# Patient Record
Sex: Male | Born: 1940
Health system: Southern US, Community
[De-identification: ages and names within clinical notes are randomized; demographics above are authoritative.]

## PROBLEM LIST (undated history)

## (undated) ENCOUNTER — Ambulatory Visit: Admission: EM | Payer: Medicare Other

## (undated) DIAGNOSIS — I2510 Atherosclerotic heart disease of native coronary artery without angina pectoris: Secondary | ICD-10-CM

## (undated) DIAGNOSIS — R011 Cardiac murmur, unspecified: Secondary | ICD-10-CM

## (undated) DIAGNOSIS — Z8673 Personal history of transient ischemic attack (TIA), and cerebral infarction without residual deficits: Secondary | ICD-10-CM

## (undated) DIAGNOSIS — D649 Anemia, unspecified: Secondary | ICD-10-CM

## (undated) DIAGNOSIS — K859 Acute pancreatitis without necrosis or infection, unspecified: Secondary | ICD-10-CM

## (undated) DIAGNOSIS — K219 Gastro-esophageal reflux disease without esophagitis: Secondary | ICD-10-CM

## (undated) DIAGNOSIS — E039 Hypothyroidism, unspecified: Secondary | ICD-10-CM

## (undated) DIAGNOSIS — Z8679 Personal history of other diseases of the circulatory system: Secondary | ICD-10-CM

## (undated) DIAGNOSIS — M1990 Unspecified osteoarthritis, unspecified site: Secondary | ICD-10-CM

## (undated) DIAGNOSIS — I517 Cardiomegaly: Secondary | ICD-10-CM

## (undated) DIAGNOSIS — I350 Nonrheumatic aortic (valve) stenosis: Secondary | ICD-10-CM

## (undated) DIAGNOSIS — Z86711 Personal history of pulmonary embolism: Secondary | ICD-10-CM

## (undated) DIAGNOSIS — Z87442 Personal history of urinary calculi: Secondary | ICD-10-CM

## (undated) DIAGNOSIS — I340 Nonrheumatic mitral (valve) insufficiency: Secondary | ICD-10-CM

## (undated) DIAGNOSIS — E669 Obesity, unspecified: Secondary | ICD-10-CM

## (undated) DIAGNOSIS — R600 Localized edema: Secondary | ICD-10-CM

## (undated) DIAGNOSIS — I509 Heart failure, unspecified: Secondary | ICD-10-CM

## (undated) DIAGNOSIS — I071 Rheumatic tricuspid insufficiency: Secondary | ICD-10-CM

## (undated) DIAGNOSIS — Z8614 Personal history of Methicillin resistant Staphylococcus aureus infection: Secondary | ICD-10-CM

## (undated) HISTORY — PX: VASECTOMY: SHX75

## (undated) HISTORY — PX: JEJUNOSTOMY FEEDING TUBE: SUR737

## (undated) HISTORY — PX: BACK SURGERY: SHX140

## (undated) HISTORY — PX: CATARACT EXTRACTION: SUR2

## (undated) HISTORY — PX: CARDIAC CATHETERIZATION: SHX172

## (undated) HISTORY — PX: CHOLECYSTECTOMY: SHX55

## (undated) HISTORY — PX: KNEE ARTHROSCOPY: SHX127

## (undated) HISTORY — PX: WISDOM TOOTH EXTRACTION: SHX21

## (undated) HISTORY — PX: PEG TUBE PLACEMENT: SUR1034

---

## 2001-03-24 ENCOUNTER — Ambulatory Visit (HOSPITAL_COMMUNITY): Admission: RE | Admit: 2001-03-24 | Discharge: 2001-03-24 | Payer: Self-pay | Admitting: General Surgery

## 2001-06-22 ENCOUNTER — Encounter: Payer: Self-pay | Admitting: Family Medicine

## 2001-06-22 ENCOUNTER — Ambulatory Visit (HOSPITAL_COMMUNITY): Admission: RE | Admit: 2001-06-22 | Discharge: 2001-06-22 | Payer: Self-pay | Admitting: Family Medicine

## 2002-12-18 ENCOUNTER — Ambulatory Visit (HOSPITAL_COMMUNITY): Admission: RE | Admit: 2002-12-18 | Discharge: 2002-12-18 | Payer: Self-pay | Admitting: Family Medicine

## 2002-12-19 ENCOUNTER — Ambulatory Visit (HOSPITAL_COMMUNITY): Admission: RE | Admit: 2002-12-19 | Discharge: 2002-12-19 | Payer: Self-pay | Admitting: Family Medicine

## 2003-02-26 ENCOUNTER — Ambulatory Visit (HOSPITAL_COMMUNITY): Admission: RE | Admit: 2003-02-26 | Discharge: 2003-02-27 | Payer: Self-pay | Admitting: Neurosurgery

## 2007-05-10 ENCOUNTER — Ambulatory Visit (HOSPITAL_COMMUNITY): Admission: RE | Admit: 2007-05-10 | Discharge: 2007-05-10 | Payer: Self-pay | Admitting: Internal Medicine

## 2007-05-13 ENCOUNTER — Ambulatory Visit (HOSPITAL_COMMUNITY): Admission: RE | Admit: 2007-05-13 | Discharge: 2007-05-13 | Payer: Self-pay | Admitting: Internal Medicine

## 2007-05-30 ENCOUNTER — Encounter: Admission: RE | Admit: 2007-05-30 | Discharge: 2007-05-30 | Payer: Self-pay | Admitting: Neurosurgery

## 2010-06-13 NOTE — Op Note (Signed)
NAME:  Andrew Watson, Andrew Watson NO.:  [id number]   MEDICAL RECORD NO.:  [id number]                   PATIENT TYPE:  OIB   LOCATION:  3023                                 FACILITY:  [facility]   PHYSICIAN:  Cristi Loron, M.D.            DATE OF BIRTH:  Feb 27, 1940   DATE OF PROCEDURE:  02/26/2003  DATE OF DISCHARGE:                                 OPERATIVE REPORT   PREOPERATIVE DIAGNOSES:  Left L5-S1 herniated nucleus pulposus, stenosis,  lumbar radiculopathy and lumbago.   POSTOPERATIVE DIAGNOSES:  Left L5-S1 herniated nucleus pulposus, stenosis,  lumbar radiculopathy and lumbago.   PROCEDURE:  Laminotomy, foraminotomy with microdiskectomy at left L5-S1  using microdissection.   SURGEON:  Cristi Loron, M.D.   ASSISTANT:  Coletta Memos, M.D.   ANESTHESIA:  General endotracheal anesthesia.   ESTIMATED BLOOD LOSS:  Minimal.   SPECIMENS:  None.   DRAINS:  None.   COMPLICATIONS:  None.   INDICATIONS FOR PROCEDURE:  The patient is a 70 year old white male who has  suffered from back and left leg pain consistent with a lumbar radiculopathy.  He failed medical management  and was worked up with a lumbar MRI which  demonstrated a herniated disk at L5-S1 on the left. Of note, the patient did  appear to have a transitional type vertebra which may have affected the  numbering system seen on x-ray reports. The patient failed medical  management. He therefore weighed the  risks, benefits and alternatives of  surgery and decided to proceed with a microdiskectomy.   DESCRIPTION OF PROCEDURE:  The patient was brought to the operating room by  the anesthesia team. General endotracheal anesthesia was induced. The  patient was turned to the prone position on the Wilson frame. His  lumbosacral region was then shaved and prepared with Betadine scrub and  Betadine solution. Sterile drapes were applied. I then injected the area to  be  incised with Marcaine with  epinephrine solution.   I then used a scalpel to make a linear and midline incision over the L5-S1  interspace. I used electrocautery to dissect down to the thoracolumbar  fascia and divided the fascia just to the left of the midline, performing a  left-sided subperiosteal dissection, stripping the paraspinous musculature  from the left spinous process lamina of L4-L5 and S1.  I inserted the McCullough retractor for exposure, and then obtained an  intraoperative radiograph to confirm our location.   We then brought the operating microscope into the field, and under its  magnification and illumination, completed the microdissection,  decompression. I used the high-speed drill to perform a left L5 laminotomy.  I widened the laminotomy with a Kerrison punch and removed the left L5-S1  ligament of Flavum.   I performed a foraminotomy about the left S1 nerve root. There was quite a  bit of stenosis at the S1 level as well, so I used a high-speed drill  to  thin out the cephalad portion of the left S1 lamina. I then used the  Kerrison punch to complete the superior laminotomy at S1 on the left. This  gave the  thecal sac much more room and greater access to the left S1 nerve  root.   We then used microdissection to free up the nerve root  at its sac from the  epidural tissue. Dr. Franky Macho then carefully retracted  the left S1 nerve  root medially with the D'Errico retractor. This exposed a moderate to large  herniated disk directly ventral to the exit route of the left S1 nerve root.   I incised into the herniated disk with a #15 blade scalpel. I performed a  partial intervertebral diskectomy and removed the herniated disk as well as  performed a partial intervertebral diskectomy using the pituitary forceps  and the Carlen curets.   After we were satisfied with the diskectomy, we used the osteophyte tool to  remove some posterior spondylosis from the vertebral endplates at L5-S1. We  then  palpated along the ventral surface of the  thecal sac and along the  exit route of the left S1 nerve root. We noted that the neural structures  were well decompressed.   We obtained stringent hemostasis using bipolar electrocautery. We copiously  irrigated the wound out with Bacitracin solution and removed the solution.  We then removed the [facility] retractor. We then reapproximated the  patient's thoracolumbar fascia with interrupted #1 Vicryl suture, the  subcutaneous tissue with interrupted 2-0 Vicryl suture  and the skin with  Steri-Strips and Benzoin. The wound was then coated with Bacitracin ointment  and a sterile dressing was applied.   The drapes were removed. The patient was subsequently returned to the supine  position, where  he was extubated by the anesthesia team and transported to  the post anesthesia care unit in stable condition. All sponge, instrument  and needle counts were correct at the end of the case.                                               Cristi Loron, M.D.    JDJ/MEDQ  D:  02/27/2003  T:  02/27/2003  Job:  [id number]

## 2011-01-27 DIAGNOSIS — Z8679 Personal history of other diseases of the circulatory system: Secondary | ICD-10-CM

## 2011-01-27 HISTORY — DX: Personal history of other diseases of the circulatory system: Z86.79

## 2011-02-03 ENCOUNTER — Encounter (INDEPENDENT_AMBULATORY_CARE_PROVIDER_SITE_OTHER): Payer: Self-pay | Admitting: *Deleted

## 2011-02-03 ENCOUNTER — Other Ambulatory Visit (INDEPENDENT_AMBULATORY_CARE_PROVIDER_SITE_OTHER): Payer: Self-pay | Admitting: *Deleted

## 2011-02-03 DIAGNOSIS — Z1211 Encounter for screening for malignant neoplasm of colon: Secondary | ICD-10-CM

## 2011-02-19 ENCOUNTER — Telehealth (INDEPENDENT_AMBULATORY_CARE_PROVIDER_SITE_OTHER): Payer: Self-pay | Admitting: *Deleted

## 2011-02-19 MED ORDER — PEG-KCL-NACL-NASULF-NA ASC-C 100 G PO SOLR: 1.0000 | Freq: Once | ORAL | Status: DC

## 2011-02-19 NOTE — Telephone Encounter (Signed)
Patient needs movi prep 

## 2011-02-19 NOTE — Telephone Encounter (Signed)
Requesting MD/PCP:  Andrew Watson     Name & DOB: Andrew Watson  2040-06-08     Procedure: TCS  Reason/Indication:  screening  Has patient had this procedure before?  yes  If so, when, by whom and where?  More than 10 yrs ago  Is there a family history of colon cancer?  no  Who?  What age when diagnosed?    Is patient diabetic?   no      Does patient have prosthetic heart valve?  no  Do you have a pacemaker?  no  Has patient had joint replacement within last 12 months?  no  Is patient on Coumadin, Plavix and/or Aspirin? yes  Medications: asa 81 mg daily, advil prn  Allergies: knda  Pharmacy: [facility]  Medication Adjustment: asa 2 days  Procedure date & time: 03/05/11 @ 930

## 2011-02-20 NOTE — Telephone Encounter (Signed)
agree

## 2011-02-23 ENCOUNTER — Encounter (HOSPITAL_COMMUNITY): Payer: Self-pay | Admitting: Pharmacy Technician

## 2011-03-25 ENCOUNTER — Telehealth (INDEPENDENT_AMBULATORY_CARE_PROVIDER_SITE_OTHER): Payer: Self-pay | Admitting: *Deleted

## 2011-03-25 NOTE — Telephone Encounter (Signed)
agree

## 2011-03-25 NOTE — Telephone Encounter (Signed)
Requesting MD/PCP:  zack hall     Name & DOB: Andrew Watson March 18, 2040     Procedure: TCS  Reason/Indication:  screening  Has patient had this procedure before?  yes  If so, when, by whom and where?  More than 10 yrs ago  Is there a family history of colon cancer?  no  Who?  What age when diagnosed?    Is patient diabetic?   no      Does patient have prosthetic heart valve?  no  Do you have a pacemaker?  no  Has patient had joint replacement within last 12 months?  no  Is patient on Coumadin, Plavix and/or Aspirin? yes  Medications: asa 81 mg daily, advil prn  Allergies: nkda  Pharmacy: [facility]  Medication Adjustment: asa 2 days  Procedure date & time: 04/23/11 at 830

## 2011-04-13 ENCOUNTER — Encounter (INDEPENDENT_AMBULATORY_CARE_PROVIDER_SITE_OTHER): Payer: Self-pay | Admitting: *Deleted

## 2011-05-04 ENCOUNTER — Encounter (INDEPENDENT_AMBULATORY_CARE_PROVIDER_SITE_OTHER): Payer: Self-pay | Admitting: *Deleted

## 2011-05-07 ENCOUNTER — Emergency Department (HOSPITAL_COMMUNITY): Payer: Medicare Other

## 2011-05-07 ENCOUNTER — Encounter (HOSPITAL_COMMUNITY)
Admission: EM | Disposition: A | Discharge status: Home / Self Care | Payer: Self-pay | Source: Home / Self Care | Attending: Cardiovascular Disease

## 2011-05-07 ENCOUNTER — Ambulatory Visit (HOSPITAL_COMMUNITY): Admit: 2011-05-07 | Payer: Self-pay | Admitting: Cardiology

## 2011-05-07 ENCOUNTER — Encounter (HOSPITAL_COMMUNITY): Payer: Self-pay

## 2011-05-07 ENCOUNTER — Inpatient Hospital Stay (HOSPITAL_COMMUNITY)
Admission: EM | Admit: 2011-05-07 | Discharge: 2011-05-09 | DRG: 287 | Disposition: A | Discharge status: Home / Self Care | Payer: Medicare Other | Attending: Cardiovascular Disease | Admitting: Cardiovascular Disease

## 2011-05-07 DIAGNOSIS — M109 Gout, unspecified: Secondary | ICD-10-CM | POA: Diagnosis present

## 2011-05-07 DIAGNOSIS — Z79899 Other long term (current) drug therapy: Secondary | ICD-10-CM

## 2011-05-07 DIAGNOSIS — M129 Arthropathy, unspecified: Secondary | ICD-10-CM | POA: Diagnosis present

## 2011-05-07 DIAGNOSIS — I359 Nonrheumatic aortic valve disorder, unspecified: Secondary | ICD-10-CM

## 2011-05-07 DIAGNOSIS — R079 Chest pain, unspecified: Secondary | ICD-10-CM

## 2011-05-07 DIAGNOSIS — I319 Disease of pericardium, unspecified: Principal | ICD-10-CM

## 2011-05-07 DIAGNOSIS — I4891 Unspecified atrial fibrillation: Secondary | ICD-10-CM | POA: Diagnosis not present

## 2011-05-07 DIAGNOSIS — Z7982 Long term (current) use of aspirin: Secondary | ICD-10-CM

## 2011-05-07 DIAGNOSIS — I2510 Atherosclerotic heart disease of native coronary artery without angina pectoris: Secondary | ICD-10-CM | POA: Diagnosis present

## 2011-05-07 DIAGNOSIS — E039 Hypothyroidism, unspecified: Secondary | ICD-10-CM

## 2011-05-07 DIAGNOSIS — E785 Hyperlipidemia, unspecified: Secondary | ICD-10-CM | POA: Diagnosis present

## 2011-05-07 DIAGNOSIS — Z1211 Encounter for screening for malignant neoplasm of colon: Secondary | ICD-10-CM

## 2011-05-07 DIAGNOSIS — I480 Paroxysmal atrial fibrillation: Secondary | ICD-10-CM

## 2011-05-07 HISTORY — DX: Unspecified osteoarthritis, unspecified site: M19.90

## 2011-05-07 HISTORY — DX: Atherosclerotic heart disease of native coronary artery without angina pectoris: I25.10

## 2011-05-07 HISTORY — PX: LEFT HEART CATHETERIZATION WITH CORONARY ANGIOGRAM: SHX5451

## 2011-05-07 LAB — D-DIMER, QUANTITATIVE: D-Dimer, Quant: 0.44 ug/mL-FEU (ref 0.00–0.48)

## 2011-05-07 LAB — MRSA PCR SCREENING: MRSA by PCR: NEGATIVE

## 2011-05-07 LAB — CARDIAC PANEL(CRET KIN+CKTOT+MB+TROPI)
CK, MB: 2.8 ng/mL (ref 0.3–4.0)
CK, MB: 1.6 ng/mL (ref 0.3–4.0)
Relative Index: 1.5 (ref 0.0–2.5)
Relative Index: INVALID (ref 0.0–2.5)
Total CK: 185 U/L (ref 7–232)
Total CK: 72 U/L (ref 7–232)
Troponin I: <0.3 ng/mL (ref <0.30)

## 2011-05-07 LAB — BASIC METABOLIC PANEL
BUN: 18 mg/dL (ref 6–23)
CO2: 26 mEq/L (ref 19–32)
Calcium: 9.1 mg/dL (ref 8.4–10.5)
Chloride: 105 mEq/L (ref 96–112)
Creatinine, Ser: 0.93 mg/dL (ref 0.50–1.35)
GFR calc Af Amer: >90 mL/min (ref >90)
GFR calc non Af Amer: 83 mL/min — ABNORMAL LOW (ref >90)
Glucose, Bld: 115 mg/dL — ABNORMAL HIGH (ref 70–99)
Potassium: 4.2 mEq/L (ref 3.5–5.1)
Sodium: 139 mEq/L (ref 135–145)

## 2011-05-07 LAB — CBC
HCT: 37.7 % — ABNORMAL LOW (ref 39.0–52.0)
Hemoglobin: 12.9 g/dL — ABNORMAL LOW (ref 13.0–17.0)
MCH: 31.3 pg (ref 26.0–34.0)
MCHC: 34.2 g/dL (ref 30.0–36.0)
MCV: 91.5 fL (ref 78.0–100.0)
Platelets: 121 10*3/uL — ABNORMAL LOW (ref 150–400)
RBC: 4.12 MIL/uL — ABNORMAL LOW (ref 4.22–5.81)
RDW: 12.6 % (ref 11.5–15.5)
WBC: 5.9 10*3/uL (ref 4.0–10.5)

## 2011-05-07 LAB — TROPONIN I: Troponin I: <0.3 ng/mL (ref <0.30)

## 2011-05-07 LAB — TSH: TSH: 25.084 u[IU]/mL — ABNORMAL HIGH (ref 0.350–4.500)

## 2011-05-07 LAB — PROTIME-INR: Prothrombin Time: 14.1 seconds (ref 11.6–15.2)

## 2011-05-07 SURGERY — LEFT HEART CATHETERIZATION WITH CORONARY ANGIOGRAM
Anesthesia: LOCAL

## 2011-05-07 MED ORDER — SODIUM CHLORIDE 0.9 % IV SOLN
INTRAVENOUS | Status: DC
  Administered 2011-05-07: 08:00:00 via INTRAVENOUS

## 2011-05-07 MED ORDER — ASPIRIN 81 MG PO CHEW
324.0000 mg | CHEWABLE_TABLET | Freq: Once | ORAL | Status: AC
  Administered 2011-05-07: 324 mg via ORAL
  Filled 2011-05-07: qty 4

## 2011-05-07 MED ORDER — HEPARIN (PORCINE) IN NACL 2-0.9 UNIT/ML-% IJ SOLN
INTRAMUSCULAR | Status: AC
  Filled 2011-05-07: qty 2000

## 2011-05-07 MED ORDER — SODIUM CHLORIDE 0.9 % IV SOLN: 1.0000 mL/kg/h | INTRAVENOUS | Status: AC

## 2011-05-07 MED ORDER — MORPHINE SULFATE 2 MG/ML IJ SOLN
2.0000 mg | Freq: Once | INTRAMUSCULAR | Status: AC
  Administered 2011-05-07: 2 mg via INTRAVENOUS
  Filled 2011-05-07: qty 1

## 2011-05-07 MED ORDER — SODIUM CHLORIDE 0.9 % IV SOLN: 250.0000 mL | INTRAVENOUS | Status: DC | PRN

## 2011-05-07 MED ORDER — CYCLOBENZAPRINE HCL 10 MG PO TABS
10.0000 mg | ORAL_TABLET | Freq: Once | ORAL | Status: AC
  Administered 2011-05-07: 10 mg via ORAL
  Filled 2011-05-07: qty 1

## 2011-05-07 MED ORDER — MIDAZOLAM HCL 2 MG/2ML IJ SOLN
INTRAMUSCULAR | Status: AC
  Filled 2011-05-07: qty 2

## 2011-05-07 MED ORDER — SODIUM CHLORIDE 0.9 % IJ SOLN: 3.0000 mL | INTRAMUSCULAR | Status: DC | PRN

## 2011-05-07 MED ORDER — FENTANYL CITRATE 0.05 MG/ML IJ SOLN
INTRAMUSCULAR | Status: AC
  Filled 2011-05-07: qty 2

## 2011-05-07 MED ORDER — ACETAMINOPHEN 325 MG PO TABS: 650.0000 mg | ORAL_TABLET | ORAL | Status: DC | PRN

## 2011-05-07 MED ORDER — ASPIRIN 81 MG PO CHEW: 324.0000 mg | CHEWABLE_TABLET | ORAL | Status: DC

## 2011-05-07 MED ORDER — HYDROMORPHONE HCL PF 1 MG/ML IJ SOLN
1.0000 mg | Freq: Once | INTRAMUSCULAR | Status: AC
  Administered 2011-05-07: 1 mg via INTRAVENOUS
  Filled 2011-05-07: qty 1

## 2011-05-07 MED ORDER — NAPROXEN 375 MG PO TABS
375.0000 mg | ORAL_TABLET | Freq: Three times a day (TID) | ORAL | Status: DC
  Administered 2011-05-07 – 2011-05-09 (×6): 375 mg via ORAL
  Filled 2011-05-07 (×8): qty 1

## 2011-05-07 MED ORDER — ZOLPIDEM TARTRATE 5 MG PO TABS
5.0000 mg | ORAL_TABLET | Freq: Every evening | ORAL | Status: DC | PRN
  Administered 2011-05-07 – 2011-05-09 (×2): 5 mg via ORAL
  Filled 2011-05-07 (×2): qty 1

## 2011-05-07 MED ORDER — ASPIRIN EC 81 MG PO TBEC
81.0000 mg | DELAYED_RELEASE_TABLET | Freq: Every day | ORAL | Status: DC
  Administered 2011-05-07 – 2011-05-08 (×2): 81 mg via ORAL
  Filled 2011-05-07 (×2): qty 1

## 2011-05-07 MED ORDER — ONDANSETRON HCL 4 MG/2ML IJ SOLN: 4.0000 mg | Freq: Four times a day (QID) | INTRAMUSCULAR | Status: DC | PRN

## 2011-05-07 MED ORDER — NITROGLYCERIN 0.2 MG/ML ON CALL CATH LAB
INTRAVENOUS | Status: AC
  Filled 2011-05-07: qty 1

## 2011-05-07 MED ORDER — HYDROMORPHONE HCL PF 2 MG/ML IJ SOLN
2.0000 mg | Freq: Once | INTRAMUSCULAR | Status: AC
  Administered 2011-05-07: 2 mg via INTRAVENOUS
  Filled 2011-05-07: qty 1

## 2011-05-07 MED ORDER — HEPARIN SODIUM (PORCINE) 1000 UNIT/ML IJ SOLN
INTRAMUSCULAR | Status: AC
  Filled 2011-05-07: qty 1

## 2011-05-07 MED ORDER — NITROGLYCERIN 0.4 MG SL SUBL: 0.4000 mg | SUBLINGUAL_TABLET | SUBLINGUAL | Status: DC | PRN

## 2011-05-07 MED ORDER — SODIUM CHLORIDE 0.9 % IJ SOLN: 3.0000 mL | Freq: Two times a day (BID) | INTRAMUSCULAR | Status: DC

## 2011-05-07 MED ORDER — ASPIRIN EC 325 MG PO TBEC: 325.0000 mg | DELAYED_RELEASE_TABLET | Freq: Every day | ORAL | Status: DC

## 2011-05-07 MED ORDER — ALPRAZOLAM 0.25 MG PO TABS: 0.2500 mg | ORAL_TABLET | Freq: Three times a day (TID) | ORAL | Status: DC | PRN

## 2011-05-07 MED ORDER — SODIUM CHLORIDE 0.9 % IV SOLN: INTRAVENOUS | Status: DC

## 2011-05-07 MED ORDER — ASPIRIN 325 MG PO TABS: 325.0000 mg | ORAL_TABLET | ORAL | Status: DC

## 2011-05-07 MED ORDER — SODIUM CHLORIDE 0.9 % IJ SOLN
3.0000 mL | Freq: Two times a day (BID) | INTRAMUSCULAR | Status: DC
  Administered 2011-05-07 – 2011-05-08 (×2): 3 mL via INTRAVENOUS

## 2011-05-07 MED ORDER — SODIUM CHLORIDE 0.9 % IV SOLN: 1.0000 mL/kg/h | INTRAVENOUS | Status: DC

## 2011-05-07 MED ORDER — HEPARIN (PORCINE) IN NACL 100-0.45 UNIT/ML-% IJ SOLN
1000.0000 [IU]/h | INTRAMUSCULAR | Status: DC
  Administered 2011-05-07: 1000 [IU]/h via INTRAVENOUS
  Filled 2011-05-07: qty 250

## 2011-05-07 MED ORDER — LIDOCAINE HCL (PF) 1 % IJ SOLN
INTRAMUSCULAR | Status: AC
  Filled 2011-05-07: qty 30

## 2011-05-07 MED ORDER — PANTOPRAZOLE SODIUM 40 MG IV SOLR
40.0000 mg | Freq: Once | INTRAVENOUS | Status: AC
  Administered 2011-05-07: 40 mg via INTRAVENOUS
  Filled 2011-05-07: qty 40

## 2011-05-07 MED ORDER — NITROGLYCERIN 0.4 MG SL SUBL
0.4000 mg | SUBLINGUAL_TABLET | SUBLINGUAL | Status: DC | PRN
  Administered 2011-05-07 (×3): 0.4 mg via SUBLINGUAL
  Filled 2011-05-07: qty 25

## 2011-05-07 MED ORDER — NITROGLYCERIN IN D5W 200-5 MCG/ML-% IV SOLN
10.0000 ug/min | INTRAVENOUS | Status: DC
  Administered 2011-05-07: 5 ug/min via INTRAVENOUS
  Filled 2011-05-07: qty 250

## 2011-05-07 MED ORDER — BIOTENE DRY MOUTH MT LIQD: 15.0000 mL | Freq: Two times a day (BID) | OROMUCOSAL | Status: DC

## 2011-05-07 MED ORDER — HEPARIN (PORCINE) IN NACL 2-0.9 UNIT/ML-% IJ SOLN
INTRAMUSCULAR | Status: AC
  Filled 2011-05-07: qty 1000

## 2011-05-07 MED ORDER — MORPHINE SULFATE 2 MG/ML IJ SOLN: 2.0000 mg | INTRAMUSCULAR | Status: DC | PRN

## 2011-05-07 NOTE — ED Notes (Signed)
Dr. Colon Branch notified of patient pain at this time.

## 2011-05-07 NOTE — ED Notes (Signed)
Complaining of chest pain that started about 3 hours ago per pt. Center of chest per pt. Throbbing per pt.

## 2011-05-07 NOTE — Consult Note (Signed)
PCP-Dr. Hughie Closs [facility], Dr. Dietrich Pates   HPI: Mr. Geurin is a 71 year old patient male who presents to the emergency room after awakening to his severe left-sided chest pain, which he describes as a sudden dull ache. The patient had some radiation to his left shoulder and left neck. The patient had some history of diaphoresis associated with it and low-grade nausea. He also is complaining of pain with in expiration. He states that the pain scared him and he presented to the emergency room, where he was given nitroglycerin, aspirin, and morphine. He states that the pain did subside somewhat with the use of the nitroglycerin and morphine, but has returned. EKG initially is normal sinus rhythm, without evidence of ischemia. Followup EKGs show T-wave flattening in V3 with nonspecific changes inferiorly and laterally. He states the pain is worse when he lays flat. Initial cardiac enzymes are negative.  He states that he been doing a lot of yard work yesterday shoveling mulch and also putting heavy items in the back of his truck. He had no pain or soreness afterwards. He has no prior cardiac history. No family history of heart disease He is not on any medication at home with no prior history of hypertension, diabetes, or hypercholesterolemia. He has been recently treated within the last week for gout.  No Known Allergies  Current Facility-Administered Medications  Medication Dose Route Frequency Provider Last Rate Last Dose  . 0.9 %  sodium chloride infusion   Intravenous Continuous Wilson Singer, MD 50 mL/hr at 05/07/11 0812    . aspirin chewable tablet 324 mg  324 mg Oral Once [employer]. Colon Branch, MD   324 mg at 05/07/11 0546  . aspirin EC tablet 325 mg  325 mg Oral Daily Nimish C Gosrani, MD      . cyclobenzaprine (FLEXERIL) tablet 10 mg  10 mg Oral Once Jodelle Gross, NP   10 mg at 05/07/11 0831  . heparin ADULT infusion 100 units/mL (25000 units/250 mL)  1,000 Units/hr Intravenous  Continuous Nicoletta Dress. Colon Branch, MD 10 mL/hr at 05/07/11 0721 1,000 Units/hr at 05/07/11 0721  . HYDROmorphone (DILAUDID) injection 1 mg  1 mg Intravenous Once Jodelle Gross, NP   1 mg at 05/07/11 0831  . morphine 2 MG/ML injection 2 mg  2 mg Intravenous Once [employer]. Colon Branch, MD   2 mg at 05/07/11 0556  . morphine 2 MG/ML injection 2 mg  2 mg Intravenous Once [employer]. Colon Branch, MD   2 mg at 05/07/11 0654  . morphine 2 MG/ML injection 2 mg  2 mg Intravenous Once Wilson Singer, MD   2 mg at 05/07/11 0806  . morphine 2 MG/ML injection 2 mg  2 mg Intravenous Q2H PRN Nimish C Gosrani, MD      . nitroGLYCERIN (NITROSTAT) SL tablet 0.4 mg  0.4 mg Sublingual Q5 min PRN Nicoletta Dress. Colon Branch, MD   0.4 mg at 05/07/11 0607  . nitroGLYCERIN 0.2 mg/mL in dextrose 5 % infusion  10-200 mcg/min Intravenous Titrated Nicoletta Dress. Colon Branch, MD 1.5 mL/hr at 05/07/11 0718 5 mcg/min at 05/07/11 0718  . pantoprazole (PROTONIX) injection 40 mg  40 mg Intravenous Once [employer]. Colon Branch, MD   40 mg at 05/07/11 0724  . sodium chloride 0.9 % injection 3 mL  3 mL Intravenous Q12H Nimish C Gosrani, MD      . DISCONTD: aspirin tablet 325 mg  325 mg Oral STAT Nicoletta Dress. Colon Branch, MD      .  DISCONTD: nitroGLYCERIN (NITROSTAT) SL tablet 0.4 mg  0.4 mg Sublingual Q5 min PRN Nicoletta Dress. Colon Branch, MD       Current Outpatient Prescriptions  Medication Sig Dispense Refill  . aspirin EC 81 MG tablet Take 81 mg by mouth daily.      . peg 3350 powder (MOVIPREP) 100 G SOLR Take 1 kit (100 g total) by mouth once.  1 kit  0    Past Medical History  Diagnosis Date  . Arthritis   . Gout     Past Surgical History  Procedure Date  . Knee arthroscopy     ZOX:WRUEAV of systems complete and found to be negative unless listed above  PHYSICAL EXAM BP 118/73  Pulse 92  Temp(Src) 97.8 F (36.6 C) (Oral)  Resp 21  Ht 6' (1.829 m)  Wt 225 lb (102.059 kg)  BMI 30.52 kg/m2  SpO2 98%\General: Well developed, well nourished, in no acute distress Head: Eyes  PERRLA, No xanthomas.   Normal cephalic and atramatic  Lungs: Clear bilaterally to auscultation and percussion. Heart: HRRR S1 S2, without MRG.  Pulses are 2+ & equal.            No carotid bruit. No JVD.  No abdominal bruits. No femoral bruits; modest systolic murmur; no rub appreciated Abdomen: Bowel sounds are positive, abdomen soft and non-tender without masses or Hernia's noted. Msk:  Back normal, normal gait. Normal strength and tone for age. Extremities: No clubbing, cyanosis or edema.  DP +1 Neuro: Alert and oriented X 3. Psych:  Good affect, responds appropriately  EKG:SR with PVC's. Nonspecific EKG changes inferior laterally.   ASSESSMENT AND PLAN  1. Chest pain:Unstable Angina; Recurrent waxing and waning associated with mild shortness of breath, position, and diaphoresis along with nausea. No prior cardiac history. Patient is on nitroglycerin, heparin, and has received aspirin and morphine. Pain has diminished and gone away, but returns. EKG shows equivocal changes inferior lateral T wave flattening noted in V3. Cannot rule out unstable angina. Plan to transfer him to [facility] have given him dilaudid 1 mg and flexeril 10 mg for pain. I have discussed this with Dr. Elease Hashimoto at [facility] who is willing to accept the patient on transfer. I have discussed this also with the patient to include risks and benefits of cardiac cath if required to include bleeding, MI, CVA and death.  He is willing to proceed with transfer and cardiac cath. I have called the patient's son, Tawanna Cooler at [phone] to inform him of his father's ER admission and transfer after requesting permission from the patient.   Cardiology Attending Patient interviewed and examined. Discussed with Joni Reining, NP.  Above note annotated and modified based upon my findings.  Modest new ST elevation in inferior and anterolateral leads with symptoms suggesting pericarditis.  Initial cardiac markers are negative 7 hrs after the onset of  symptoms.  Echo pending.  If no segmental wall motion abnormalities as expected, we will Rx for presumed pericarditis.  He has been anticoagulated, which has been stopped, and thus has some risk for a pericardial bleed.  Further care at [facility] with the availability of tertiary services is the most prudent course.  Echo-no segmental wall motion abnormalities; normal EF, no pericardial abnormalities.  Rx with non-steroidal initiated.  Heparin and NTG discontinued.  Transfer orders on Hold.  OK for discharge when pain controlled and MI ruled out.  Burdett Bing, MD 05/07/2011, 9:45 AM

## 2011-05-07 NOTE — H&P (View-Only) (Signed)
See full note from Dr. Rothbart.  Pt's pain is somewhat atypical in that it has a definite pleuretic component.  Also varies with position.  Described as a pressure 7/10 currently.   He has ST elevation in the inferior lat leads and I think it is reasonable to do a cath.  Will add D-dimer to blood work.  Orders written  Marti Acebo J. Gregorio Worley, Jr., MD, FACC 05/07/2011, 11:54 AM   

## 2011-05-07 NOTE — ED Provider Notes (Signed)
History     CSN: [id number]  Arrival date & time 05/07/11  0446   First MD Initiated Contact with Patient 05/07/11 [phone]      Chief Complaint  Patient presents with  . Chest Pain    (Consider location/radiation/quality/duration/timing/severity/associated sxs/prior treatment) HPI Andrew Watson is a 71 y.o. male who presents to the Emergency Department complaining of chest pain that woke him from sleep 3 hours PTA. He states he woke earlier in the night to go to the bathroom and had seats requiring he change his tee shirt. Woke the second time with throbbing, continuous pain to the center of his chest with radiation to the left shoulder. He denies nausea, vomiting, shortness of breath. He reports that he worked extensively in his yard yesterday lifting and carrying items. In addition he lifted a heavy can of paint last night and slid into his truck. Pain does not increase with movement it happened spontaneously and when it occurs it remains for several minutes at an intensity of 8 or 9. He is taking no medicines.  PCP Dr. Margo Aye Past Medical History  Diagnosis Date  . Arthritis   . Gout     Past Surgical History  Procedure Date  . Knee arthroscopy     No family history on file.  History  Substance Use Topics  . Smoking status: Never Smoker   . Smokeless tobacco: Not on file  . Alcohol Use: Yes      Review of Systems ROS: Statement: All systems negative except as marked or noted in the HPI; Constitutional: Negative for fever and chills. ; ; Eyes: Negative for eye pain, redness and discharge. ; ; ENMT: Negative for ear pain, hoarseness, nasal congestion, sinus pressure and sore throat. ; ; Cardiovascular: Negative forpalpitations, diaphoresis, dyspnea and peripheral edema. ; ; Respiratory: Negative for cough, wheezing and stridor. ; ; Gastrointestinal: Negative for nausea, vomiting, diarrhea, abdominal pain, blood in stool, hematemesis, jaundice and rectal bleeding. . ; ;  Genitourinary: Negative for dysuria, flank pain and hematuria. ; ; Musculoskeletal: Negative for back pain and neck pain. Negative for swelling and trauma.; ; Skin: Negative for pruritus, rash, abrasions, blisters, bruising and skin lesion.; ; Neuro: Negative for headache, lightheadedness and neck stiffness. Negative for weakness, altered level of consciousness , altered mental status, extremity weakness, paresthesias, involuntary movement, seizure and syncope.     Allergies  Review of patient's allergies indicates no known allergies.  Home Medications   Current Outpatient Rx  Name Route Sig Dispense Refill  . ASPIRIN EC 81 MG PO TBEC Oral Take 81 mg by mouth daily.    [name] Kitchen PEG-KCL-NACL-NASULF-NA ASC-C 100 G PO SOLR Oral Take 1 kit (100 g total) by mouth once. 1 kit 0    BP 98/73  Pulse 85  Temp(Src) 97.8 F (36.6 C) (Oral)  Resp 21  Ht 6' (1.829 m)  Wt 225 lb (102.059 kg)  BMI 30.52 kg/m2  SpO2 97%  Physical Exam Physical examination:  Nursing notes reviewed; Vital signs and O2 SAT reviewed;  Constitutional: Well developed, Well nourished, Well hydrated, In no acute distress; Head:  Normocephalic, atraumatic; Eyes: EOMI, PERRL, No scleral icterus; ENMT: Mouth and pharynx normal, Mucous membranes moist; Neck: Supple, Full range of motion, No lymphadenopathy, no bruits; Cardiovascular: Regular rate and rhythm, No murmur, rub, or gallop, no chest wall pain with palpation; Respiratory: Breath sounds clear & equal bilaterally, No rales, rhonchi, wheezes, or rub, Normal respiratory effort/excursion; Chest: Nontender, Movement normal; Abdomen: Soft, Nontender,  Nondistended, Normal bowel sounds; Genitourinary: No CVA tenderness; Extremities: Pulses normal, No tenderness, No edema, No calf edema or asymmetry, arthritic changes to hands. .; Neuro: AA&Ox3, Major CN grossly intact.  No gross focal motor or sensory deficits in extremities.; Skin: Color normal, Warm, Dry  ED Course  Procedures (including  critical care time) Results for orders placed during the hospital encounter of 05/07/11  CBC      Component Value Range   WBC 5.9  4.0 - 10.5 (K/uL)   RBC 4.12 (*) 4.22 - 5.81 (MIL/uL)   Hemoglobin 12.9 (*) 13.0 - 17.0 (g/dL)   HCT 16.1 (*) 09.6 - 52.0 (%)   MCV 91.5  78.0 - 100.0 (fL)   MCH 31.3  26.0 - 34.0 (pg)   MCHC 34.2  30.0 - 36.0 (g/dL)   RDW 04.5  40.9 - 81.1 (%)   Platelets 121 (*) 150 - 400 (K/uL)  BASIC METABOLIC PANEL      Component Value Range   Sodium 139  135 - 145 (mEq/L)   Potassium 4.2  3.5 - 5.1 (mEq/L)   Chloride 105  96 - 112 (mEq/L)   CO2 26  19 - 32 (mEq/L)   Glucose, Bld 115 (*) 70 - 99 (mg/dL)   BUN 18  6 - 23 (mg/dL)   Creatinine, Ser 9.14  0.50 - 1.35 (mg/dL)   Calcium 9.1  8.4 - 78.2 (mg/dL)   GFR calc non Af Amer 83 (*) >90 (mL/min)   GFR calc Af Amer >90  >90 (mL/min)  TROPONIN I      Component Value Range   Troponin I <0.30  <0.30 (ng/mL)    Chest Portable 1 View  05/07/2011  *RADIOLOGY REPORT*  Clinical Data: Chest pain, worse with deep inspiration.  PORTABLE CHEST - 1 VIEW  Comparison: None.  Findings: The lungs are well-aerated and clear.  There is no evidence of focal opacification, pleural effusion or pneumothorax. An apparent left-sided nipple shadow is noted.  The cardiomediastinal silhouette is within normal limits.  No acute osseous abnormalities are seen.  IMPRESSION: No acute cardiopulmonary process seen.  Apparent left-sided nipple shadow noted; would suggest repeat chest radiograph with nipple markers, to ensure that this reflects the nipple shadow.  Original Report Authenticated By: Tonia Ghent, M.D.    Date: 05/07/2011  0458  Rate: 79  Rhythm: normal sinus rhythm  QRS Axis: normal  Intervals: normal  ST/T Wave abnormalities: normal  Conduction Disutrbances: none  Narrative Interpretation: unremarkable  #2 with pain   Date: 05/07/2011  0539  Rate: 84  Rhythm: normal sinus rhythm  QRS Axis: normal  Intervals: normal   ST/T Wave abnormalities: normal  Conduction Disutrbances: none  Narrative Interpretation: unremarkable   7:48 AM:  T/C to Dr. Karilyn Cota, case discussed, including:  HPI, pertinent PM/SHx, VS/PE, dx testing, ED course and treatment.  Agreeable to admission to telemetry.  Requests to write temporary orders, telemetry bed to team 2     MDM  Patient with chest pain that woke him from sleep. No history of cardiac disease. Pain located in the center of his chest with radiation to left shoulder. Initial troponin is negative. Patient received aspirin and nitroglycerin with relief of the presenting pain, which recurred and was resolved with morphine. Initiated nitroglycerin and heparin IV. Will arrange admission for additional serial enzymes. Pt stable in ED with no significant deterioration in condition.The patient appears reasonably stabilized for admission considering the current resources, flow, and capabilities available in the ED  at this time, and I doubt any other [facility] requiring further screening and/or treatment in the ED prior to admission.  MDM Reviewed: nursing note and vitals Interpretation: labs, ECG and x-ray Consults: hospitalist.          Nicoletta Dress. Colon Branch, MD 05/07/11 [phone]

## 2011-05-07 NOTE — H&P (Signed)
KIERRE HINTZ MRN: [id number] DOB/AGE: 07/28/1940 71 y.o. Primary Care Physician:HALL,ZACK, MD, MD Admit date: 05/07/2011 Chief Complaint: Chest pain. 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 He was woken up at 1 AM today with sweating. Later on, at 3 AM he started to get central chest pain radiating up to his neck and his left shoulder area. The pain is dull in nature and appears to be waxing and waning with a background pain. He does seem to be worse on inspiration on occasions. He had been working in the yard yesterday and there was some heavy lifting associated. There is no cough, no fever. There is no family history of coronary artery disease. He is not hypertensive. He is nonsmoker. Is not diabetic. He says his cholesterol checked last was low.  Past Medical History  Diagnosis Date  . Arthritis   . Gout    Past Surgical History  Procedure Date  . Knee arthroscopy         No family history on file.  Social History:  reports that he has never smoked. He does not have any smokeless tobacco history on file. He reports that he drinks alcohol. He reports that he does not use illicit drugs.   Allergies: No Known Allergies  Medications Prior to Admission  Medication Dose Route Frequency Provider Last Rate Last Dose  . 0.9 %  sodium chloride infusion   Intravenous Continuous Delwin Raczkowski C Vaunda Gutterman, MD      . aspirin chewable tablet 324 mg  324 mg Oral Once [employer]. Colon Branch, MD   324 mg at 05/07/11 0546  . aspirin EC tablet 325 mg  325 mg Oral Daily Taseen Marasigan C Alka Falwell, MD      . heparin ADULT infusion 100 units/mL (25000 units/250 mL)  1,000 Units/hr Intravenous Continuous Nicoletta Dress. Colon Branch, MD 10 mL/hr at 05/07/11 0721 1,000 Units/hr at 05/07/11 0721  . morphine 2 MG/ML injection 2 mg  2 mg Intravenous Once [employer]. Colon Branch, MD   2 mg at 05/07/11 0556  . morphine 2 MG/ML injection 2 mg  2 mg Intravenous Once [employer]. Colon Branch, MD   2 mg at  05/07/11 0654  . morphine 2 MG/ML injection 2 mg  2 mg Intravenous Once Wilson Singer, MD   2 mg at 05/07/11 0806  . morphine 2 MG/ML injection 2 mg  2 mg Intravenous Q2H PRN Ysidro Ramsay C Jami Ohlin, MD      . nitroGLYCERIN (NITROSTAT) SL tablet 0.4 mg  0.4 mg Sublingual Q5 min PRN Nicoletta Dress. Colon Branch, MD   0.4 mg at 05/07/11 0607  . nitroGLYCERIN 0.2 mg/mL in dextrose 5 % infusion  10-200 mcg/min Intravenous Titrated Nicoletta Dress. Colon Branch, MD 1.5 mL/hr at 05/07/11 0718 5 mcg/min at 05/07/11 0718  . pantoprazole (PROTONIX) injection 40 mg  40 mg Intravenous Once [employer]. Colon Branch, MD   40 mg at 05/07/11 0724  . sodium chloride 0.9 % injection 3 mL  3 mL Intravenous Q12H Analys Ryden C Lazarius Rivkin, MD      . DISCONTD: aspirin tablet 325 mg  325 mg Oral STAT Nicoletta Dress. Colon Branch, MD      . DISCONTD: nitroGLYCERIN (NITROSTAT) SL tablet 0.4 mg  0.4 mg Sublingual Q5 min PRN Nicoletta Dress. Colon Branch, MD       Medications Prior to Admission  Medication Sig Dispense Refill  . aspirin EC 81 MG tablet Take 81 mg by mouth daily.      [name] Kitchen  peg 3350 powder (MOVIPREP) 100 G SOLR Take 1 kit (100 g total) by mouth once.  1 kit  0       AVW:[id number] from the symptoms mentioned above,there are no other symptoms referable to all systems reviewed.  Physical Exam: Blood pressure 118/73, pulse 92, temperature 97.8 F (36.6 C), temperature source Oral, resp. rate 21, height 6' (1.829 m), weight 102.059 kg (225 lb), SpO2 98.00%. He looks systemically well. He did appear to be in pain on the left side of his shoulder area in the process of my examining him. Heart sounds are present without gallop rhythm. Jugular venous pressure not raised. There is no pericardial rub. Lung fields are entirely clear without pleural rub. Abdomen soft nontender. He is alert and orientated. There are no focal neurological signs. In particular, there is no real chest wall tenderness reproducing his pain.    Basename 05/07/11 0512  WBC 5.9  NEUTROABS --  HGB 12.9*  HCT 37.7*    MCV 91.5  PLT 121*    Basename 05/07/11 0512  NA 139  K 4.2  CL 105  CO2 26  GLUCOSE 115*  BUN 18  CREATININE 0.93  CALCIUM 9.1  MG --         Chest Portable 1 View  05/07/2011  *RADIOLOGY REPORT*  Clinical Data: Chest pain, worse with deep inspiration.  PORTABLE CHEST - 1 VIEW  Comparison: None.  Findings: The lungs are well-aerated and clear.  There is no evidence of focal opacification, pleural effusion or pneumothorax. An apparent left-sided nipple shadow is noted.  The cardiomediastinal silhouette is within normal limits.  No acute osseous abnormalities are seen.  IMPRESSION: No acute cardiopulmonary process seen.  Apparent left-sided nipple shadow noted; would suggest repeat chest radiograph with nipple markers, to ensure that this reflects the nipple shadow.  Original Report Authenticated By: Tonia Ghent, M.D.   Impression: 1. Chest pain, highly suspicious for unstable angina on history. Slightly atypical in that it occasionally gets worse with inspiration.     Plan: 1. Admit to telemetry. 2. Agree with IV heparin and nitroglycerin. 3. Check d-dimer and if elevated, proceed to CT  angiogram of the chest. 4. Cardiology consultation as soon as possible. I've spoken with Joni Reining, nurse practitioner with  [facility] who will come and see the patient very soon.      Wilson Singer Pager [phone]  05/07/2011, 8:11 AM

## 2011-05-07 NOTE — Interval H&P Note (Signed)
History and Physical Interval Note:  05/07/2011 1:46 PM  Andrew Watson  has presented today for surgery, with the diagnosis of Chest pain  The various methods of treatment have been discussed with the patient and family. After consideration of risks, benefits and other options for treatment, the patient has consented to  Procedure(s) (LRB): LEFT HEART CATHETERIZATION WITH CORONARY ANGIOGRAM (N/A) as a surgical intervention .  The patients' history has been reviewed, patient examined, no change in status, stable for surgery.  I have reviewed the patients' chart and labs.  Questions were answered to the patient's satisfaction.     Theron Arista [facility] 05/07/2011 1:46 PM

## 2011-05-07 NOTE — CV Procedure (Signed)
   Cardiac Catheterization Procedure Note  Name: Andrew Watson MRN: [id number] DOB: Nov 13, 1940  Procedure: Left Heart Cath, Selective Coronary Angiography  Indication: 71 year old white male who presents with symptoms of chest pain. ECG shows mild ST elevation in the lateral leads. Because of ongoing chest pain urgent cardiac catheterization was recommended. Prior echocardiogram showed normal LV function with no significant pericardial effusion.   Procedural Details: The right wrist was prepped, draped, and anesthetized with 1% lidocaine. Using the modified Seldinger technique, a 5 French sheath was introduced into the right radial artery. 2.5 mg of nicardipine was administered through the sheath, weight-based unfractionated heparin was administered intravenously. Standard Judkins catheters were used for selective coronary angiography and left ventriculography. Catheter exchanges were performed over an exchange length guidewire. There were no immediate procedural complications. A TR band was used for radial hemostasis at the completion of the procedure.  The patient was transferred to the post catheterization recovery area for further monitoring.  Procedural Findings: Hemodynamics: AO 116/68 mmHg LV 122 with an EDP of 22 mmHg  Coronary angiography: Coronary dominance: left  Left mainstem: The LAD and circumflex have separate ostia.  Left anterior descending (LAD): The LAD is a large vessel with scattered irregularities in the mid vessel up to 10-20%.  Left circumflex (LCx): The circumflex is a large dominant vessel. There is 10-20% narrowing after the first obtuse marginal vessel.  Right coronary artery (RCA): This is a small nondominant vessel and is normal.  Left ventriculography:N/A  Final Conclusions: 1. Minimal nonobstructive atherosclerotic coronary disease. 2. Normal left ventricular filling pressures.  Recommendations: Evaluate other causes for his acute chest  pain.  Corlette Ciano [place] 05/07/2011, 2:58 PM

## 2011-05-07 NOTE — Progress Notes (Signed)
See full note from Dr. Dietrich Pates.  Pt's pain is somewhat atypical in that it has a definite pleuretic component.  Also varies with position.  Described as a pressure 7/10 currently.   He has ST elevation in the inferior lat leads and I think it is reasonable to do a cath.  Will add D-dimer to blood work.  Orders written  Alvia Grove., MD, [facility] 05/07/2011, 11:54 AM

## 2011-05-07 NOTE — Progress Notes (Signed)
*  PRELIMINARY RESULTS* Echocardiogram 2D Echocardiogram has been performed.  Conrad [place] 05/07/2011, 10:09 AM

## 2011-05-08 DIAGNOSIS — I480 Paroxysmal atrial fibrillation: Secondary | ICD-10-CM

## 2011-05-08 DIAGNOSIS — E039 Hypothyroidism, unspecified: Secondary | ICD-10-CM

## 2011-05-08 LAB — BASIC METABOLIC PANEL
CO2: 25 mEq/L (ref 19–32)
Calcium: 9.1 mg/dL (ref 8.4–10.5)
Creatinine, Ser: 0.91 mg/dL (ref 0.50–1.35)
GFR calc non Af Amer: 83 mL/min — ABNORMAL LOW (ref >90)
Sodium: 137 mEq/L (ref 135–145)

## 2011-05-08 LAB — COMPREHENSIVE METABOLIC PANEL
BUN: 19 mg/dL (ref 6–23)
CO2: 25 mEq/L (ref 19–32)
Calcium: 8.6 mg/dL (ref 8.4–10.5)
Chloride: 105 mEq/L (ref 96–112)
Creatinine, Ser: 0.88 mg/dL (ref 0.50–1.35)
GFR calc Af Amer: >90 mL/min (ref >90)
GFR calc non Af Amer: 84 mL/min — ABNORMAL LOW (ref >90)
Total Bilirubin: 2.8 mg/dL — ABNORMAL HIGH (ref 0.3–1.2)

## 2011-05-08 LAB — CBC
Platelets: 99 10*3/uL — ABNORMAL LOW (ref 150–400)
RBC: 4.08 MIL/uL — ABNORMAL LOW (ref 4.22–5.81)
WBC: 6.7 10*3/uL (ref 4.0–10.5)

## 2011-05-08 LAB — CARDIAC PANEL(CRET KIN+CKTOT+MB+TROPI): Relative Index: INVALID (ref 0.0–2.5)

## 2011-05-08 LAB — T3, FREE: T3, Free: 2 pg/mL — ABNORMAL LOW (ref 2.3–4.2)

## 2011-05-08 LAB — HEPARIN LEVEL (UNFRACTIONATED): Heparin Unfractionated: <0.1 IU/mL — ABNORMAL LOW (ref 0.30–0.70)

## 2011-05-08 LAB — TSH: TSH: 13 u[IU]/mL — ABNORMAL HIGH (ref 0.350–4.500)

## 2011-05-08 MED ORDER — HEPARIN (PORCINE) IN NACL 100-0.45 UNIT/ML-% IJ SOLN
1600.0000 [IU]/h | INTRAMUSCULAR | Status: DC
  Filled 2011-05-08: qty 250

## 2011-05-08 MED ORDER — HEPARIN BOLUS VIA INFUSION
2000.0000 [IU] | Freq: Once | INTRAVENOUS | Status: DC
  Filled 2011-05-08: qty 2000

## 2011-05-08 MED ORDER — DILTIAZEM HCL 100 MG IV SOLR: 5.0000 mg/h | INTRAVENOUS | Status: DC

## 2011-05-08 MED ORDER — ASPIRIN EC 325 MG PO TBEC
325.0000 mg | DELAYED_RELEASE_TABLET | Freq: Every day | ORAL | Status: DC
  Administered 2011-05-09: 325 mg via ORAL
  Filled 2011-05-08: qty 1

## 2011-05-08 MED ORDER — HEPARIN (PORCINE) IN NACL 100-0.45 UNIT/ML-% IJ SOLN
1400.0000 [IU]/h | INTRAMUSCULAR | Status: DC
  Administered 2011-05-08: 1400 [IU]/h via INTRAVENOUS
  Filled 2011-05-08 (×2): qty 250

## 2011-05-08 MED ORDER — DILTIAZEM HCL 100 MG IV SOLR
5.0000 mg/h | INTRAVENOUS | Status: DC
  Filled 2011-05-08: qty 100

## 2011-05-08 NOTE — Progress Notes (Signed)
UR Completed. Simmons, Cesily Cuoco F [phone]  

## 2011-05-08 NOTE — Progress Notes (Signed)
Pt's HR occasionally accelerating from 80s to 140s. EKG confirms that patient is going in and out of afib. Dr. Charm Barges on call notified. Pt's BP down some from baseline but he doesn't c/o any new CP, SOB or palpitations. Will cont. to monitor.

## 2011-05-08 NOTE — Progress Notes (Signed)
ANTICOAGULATION CONSULT NOTE - Follow Up Consult  Pharmacy Consult for Heparin Indication: atrial fibrillation  No Known Allergies  Patient Measurements: Height: 6' (182.9 cm) Weight: 223 lb 12.3 oz (101.5 kg) IBW/kg (Calculated) : 77.6  Heparin Dosing Weight: ~98kg  Vital Signs: Temp: 97.8 F (36.6 C) (04/12 0800) Temp src: Oral (04/12 0800) BP: 104/58 mmHg (04/12 0800)  Labs:  Alvira Philips 05/08/11 0920 05/08/11 0610 05/08/11 0531 05/07/11 2138 05/07/11 0847 05/07/11 0512  HGB -- -- 12.8* -- -- 12.9*  HCT -- -- 37.7* -- -- 37.7*  PLT -- -- 99* -- -- 121*  APTT -- -- -- -- -- --  LABPROT -- -- -- -- -- 14.1  INR -- -- -- -- -- 1.07  HEPARINUNFRC <0.10* -- -- -- -- --  CREATININE -- -- 0.88 -- -- 0.93  CKTOTAL -- 59 -- 72 185 --  CKMB -- 1.5 -- 1.6 2.8 --  TROPONINI -- <0.30 -- <0.30 <0.30 --   Estimated Creatinine Clearance: 95 ml/min (by C-G formula based on Cr of 0.88).  Medications:  Heparin @ 1400 units/hr  Assessment: 70yom s/p cath going in and out of afib started on heparin with an initial heparin level that is undetectable. No issues with infusion.   Goal of Therapy:  Heparin level 0.3-0.7 units/ml   Plan:  1) Small heparin bolus 2000 units x 1 2) Increase heparin to 1600 units/hr 3) 8h heparin level  Fredrik Rigger 05/08/2011,10:20 AM

## 2011-05-08 NOTE — Progress Notes (Signed)
ANTICOAGULATION CONSULT NOTE - Initial Consult  Pharmacy Consult for heparin Indication: atrial fibrillation  No Known Allergies  Patient Measurements: Height: 6' (182.9 cm) Weight: 223 lb 12.3 oz (101.5 kg) IBW/kg (Calculated) : 77.6   Vital Signs: Temp: 98.8 F (37.1 C) (04/12 0000) Temp src: Oral (04/12 0000) BP: 97/64 mmHg (04/12 0000) Pulse Rate: 90  (04/11 2025)  Labs:  Alvira Philips 05/07/11 2138 05/07/11 0847 05/07/11 0512  HGB -- -- 12.9*  HCT -- -- 37.7*  PLT -- -- 121*  APTT -- -- --  LABPROT -- -- 14.1  INR -- -- 1.07  HEPARINUNFRC -- -- --  CREATININE -- -- 0.93  CKTOTAL 72 185 --  CKMB 1.6 2.8 --  TROPONINI <0.30 <0.30 <0.30   Estimated Creatinine Clearance: 89.9 ml/min (by C-G formula based on Cr of 0.93).  Medical History: Past Medical History  Diagnosis Date  . Arthritis   . Gout     Medications:  Prescriptions prior to admission  Medication Sig Dispense Refill  . aspirin EC 81 MG tablet Take 81 mg by mouth daily.      . peg 3350 powder (MOVIPREP) 100 G SOLR Take 1 kit (100 g total) by mouth once.  1 kit  0   Scheduled:    . antiseptic oral rinse  15 mL Mouth Rinse BID  . aspirin  324 mg Oral Once  . aspirin EC  81 mg Oral Daily  . cyclobenzaprine  10 mg Oral Once  . fentaNYL      . heparin      . heparin      . heparin      . HYDROmorphone  1 mg Intravenous Once  . HYDROmorphone  2 mg Intravenous Once  . lidocaine      . midazolam      .  morphine injection  2 mg Intravenous Once  .  morphine injection  2 mg Intravenous Once  .  morphine injection  2 mg Intravenous Once  . naproxen  375 mg Oral TID  . nitroGLYCERIN      . pantoprazole (PROTONIX) IV  40 mg Intravenous Once  . sodium chloride  3 mL Intravenous Q12H  . DISCONTD: sodium chloride   Intravenous STAT  . DISCONTD: aspirin  324 mg Oral Pre-Cath  . DISCONTD: aspirin  324 mg Oral Pre-Cath  . DISCONTD: aspirin EC  325 mg Oral Daily  . DISCONTD: aspirin EC  325 mg Oral Daily    . DISCONTD: aspirin  325 mg Oral STAT  . DISCONTD: sodium chloride  3 mL Intravenous Q12H  . DISCONTD: sodium chloride  3 mL Intravenous Q12H    Assessment: 71yo male post-cath going in and out of Afib with HR accelerating from 80s to 140s but pt denies overt subjective Sx, to begin heparin until further decisions made in am.  Goal of Therapy:  Heparin level 0.3-0.7 units/ml   Plan:  Will begin heparin gtt at 1400 units/hr (will hold off on bolus since cath was <12hr ago) and monitor heparin levels and CBC.  Colleen Can PharmD BCPS 05/08/2011,1:36 AM

## 2011-05-08 NOTE — Progress Notes (Addendum)
Subjective: CP with deep breath.  Triv amt with lying.  (substernal in location) Objective: Filed Vitals:   05/08/11 0600 05/08/11 0700 05/08/11 0800 05/08/11 1000  BP: 99/63 91/59 104/58 103/65  Pulse:      Temp:   97.8 F (36.6 C)   TempSrc:   Oral   Resp:   16 16  Height:      Weight:      SpO2:   96% 96%   Weight change: -1 lb 3.7 oz (-0.559 kg)  Intake/Output Summary (Last 24 hours) at 05/08/11 1100 Last data filed at 05/08/11 1000  Gross per 24 hour  Intake   1550 ml  Output   1430 ml  Net    120 ml    General: Alert, awake, oriented x3, in no acute distress Neck:  JVP is normal Heart: Regular rate and rhythm, without murmurs, rubs, gallops.  Chest:  Min tenderness to palpation of sternum Lungs: Clear to auscultation.  No rales or wheezes. Exemities:  No edema.   Neuro: Grossly intact, nonfocal.  Tele:  SR with bursts of afib.   Lab Results: Results for orders placed during the hospital encounter of 05/07/11 (from the past 24 hour(s))  MRSA PCR SCREENING     Status: Normal   Collection Time   05/07/11 12:04 PM      Component Value Range   MRSA by PCR NEGATIVE  NEGATIVE   CARDIAC PANEL(CRET KIN+CKTOT+MB+TROPI)     Status: Normal   Collection Time   05/07/11  9:38 PM      Component Value Range   Total CK 72  7 - 232 (U/L)   CK, MB 1.6  0.3 - 4.0 (ng/mL)   Troponin I <0.30  <0.30 (ng/mL)   Relative Index RELATIVE INDEX IS INVALID  0.0 - 2.5   COMPREHENSIVE METABOLIC PANEL     Status: Abnormal   Collection Time   05/08/11  5:31 AM      Component Value Range   Sodium 137  135 - 145 (mEq/L)   Potassium 3.8  3.5 - 5.1 (mEq/L)   Chloride 105  96 - 112 (mEq/L)   CO2 25  19 - 32 (mEq/L)   Glucose, Bld 99  70 - 99 (mg/dL)   BUN 19  6 - 23 (mg/dL)   Creatinine, Ser 1.61  0.50 - 1.35 (mg/dL)   Calcium 8.6  8.4 - 09.6 (mg/dL)   Total Protein 6.0  6.0 - 8.3 (g/dL)   Albumin 3.2 (*) 3.5 - 5.2 (g/dL)   AST 15  0 - 37 (U/L)   ALT 14  0 - 53 (U/L)   Alkaline  Phosphatase 38 (*) 39 - 117 (U/L)   Total Bilirubin 2.8 (*) 0.3 - 1.2 (mg/dL)   GFR calc non Af Amer 84 (*) >90 (mL/min)   GFR calc Af Amer >90  >90 (mL/min)  CBC     Status: Abnormal   Collection Time   05/08/11  5:31 AM      Component Value Range   WBC 6.7  4.0 - 10.5 (K/uL)   RBC 4.08 (*) 4.22 - 5.81 (MIL/uL)   Hemoglobin 12.8 (*) 13.0 - 17.0 (g/dL)   HCT 04.5 (*) 40.9 - 52.0 (%)   MCV 92.4  78.0 - 100.0 (fL)   MCH 31.4  26.0 - 34.0 (pg)   MCHC 34.0  30.0 - 36.0 (g/dL)   RDW 81.1  91.4 - 78.2 (%)   Platelets 99 (*) 150 - 400 (  K/uL)  CARDIAC PANEL(CRET KIN+CKTOT+MB+TROPI)     Status: Normal   Collection Time   05/08/11  6:10 AM      Component Value Range   Total CK 59  7 - 232 (U/L)   CK, MB 1.5  0.3 - 4.0 (ng/mL)   Troponin I <0.30  <0.30 (ng/mL)   Relative Index RELATIVE INDEX IS INVALID  0.0 - 2.5   HEPARIN LEVEL (UNFRACTIONATED)     Status: Abnormal   Collection Time   05/08/11  9:20 AM      Component Value Range   Heparin Unfractionated <0.10 (*) 0.30 - 0.70 (IU/mL)    Studies/Results: Chest Portable 1 View  05/07/2011  *RADIOLOGY REPORT*  Clinical Data: Chest pain, worse with deep inspiration.  PORTABLE CHEST - 1 VIEW  Comparison: None.  Findings: The lungs are well-aerated and clear.  There is no evidence of focal opacification, pleural effusion or pneumothorax. An apparent left-sided nipple shadow is noted.  The cardiomediastinal silhouette is within normal limits.  No acute osseous abnormalities are seen.  IMPRESSION: No acute cardiopulmonary process seen.  Apparent left-sided nipple shadow noted; would suggest repeat chest radiograph with nipple markers, to ensure that this reflects the nipple shadow.  Original Report Authenticated By: Tonia Ghent, M.D.    Medications: I have reviewed the patient's current medications.   Patient Active Hospital Problem List:  Chest pain:  EKG with signif ST changes.  I am not convinced it represents pericarditis.  Cath yesterday  with minimal CAD.  No spasm.  D dimer negative. Will Rx for musculoskel, pleurisy.  D/C heparin  Afib.  Intermitt with high rates.  Curr in SR.  BP is limiting I would d/c heparin.  CHA2DS2-VASc is 1.   Tx to floor and ambulate.  Dyslip:  With minimal CAD will check lipids in AM.  LOS: 1 day   Dietrich Pates 05/08/2011, 11:00 AM  Note:  TSH is elevated at 25.  This may explain afib.  Will repeat and check t3, t4.  HOld heparin for now.

## 2011-05-08 NOTE — Progress Notes (Signed)
Cardiology PN  Pt's EKG reviewed.  He is intermittently in AFib.  Will place on heparin ggt tonight (LHC today from radial approach) for further plans for rhythm and anticoagulation agent to be made in AM.

## 2011-05-09 DIAGNOSIS — I319 Disease of pericardium, unspecified: Secondary | ICD-10-CM

## 2011-05-09 DIAGNOSIS — I4891 Unspecified atrial fibrillation: Secondary | ICD-10-CM

## 2011-05-09 DIAGNOSIS — E039 Hypothyroidism, unspecified: Secondary | ICD-10-CM

## 2011-05-09 LAB — LIPID PANEL
Cholesterol: 112 mg/dL (ref 0–200)
Total CHOL/HDL Ratio: 2.4 RATIO
VLDL: 15 mg/dL (ref 0–40)

## 2011-05-09 LAB — CBC
MCH: 30.9 pg (ref 26.0–34.0)
MCHC: 33.5 g/dL (ref 30.0–36.0)
MCV: 92.2 fL (ref 78.0–100.0)
Platelets: 117 10*3/uL — ABNORMAL LOW (ref 150–400)
RBC: 4.08 MIL/uL — ABNORMAL LOW (ref 4.22–5.81)

## 2011-05-09 MED ORDER — IBUPROFEN 600 MG PO TABS: 600.0000 mg | ORAL_TABLET | Freq: Two times a day (BID) | ORAL | Status: AC

## 2011-05-09 MED ORDER — ASPIRIN 325 MG PO TBEC: 325.0000 mg | DELAYED_RELEASE_TABLET | Freq: Every day | ORAL | Status: AC

## 2011-05-09 MED ORDER — SODIUM CHLORIDE 0.45 % IV SOLN: Freq: Once | INTRAVENOUS | Status: DC

## 2011-05-09 MED ORDER — PANTOPRAZOLE SODIUM 40 MG PO TBEC: 40.0000 mg | DELAYED_RELEASE_TABLET | Freq: Every day | ORAL | Status: DC

## 2011-05-09 MED ORDER — LEVOTHYROXINE SODIUM 50 MCG PO TABS: 50.0000 ug | ORAL_TABLET | Freq: Every morning | ORAL | Status: DC

## 2011-05-09 MED ORDER — METOPROLOL SUCCINATE ER 25 MG PO TB24: 25.0000 mg | ORAL_TABLET | Freq: Every day | ORAL | Status: DC

## 2011-05-09 NOTE — Discharge Instructions (Signed)
PLEASE REMEMBER TO BRING ALL OF YOUR MEDICATIONS TO EACH OF YOUR FOLLOW-UP OFFICE VISITS.  PLEASE ATTEND ALL FOLLOW-UP APPOINTMENTS.   PLEASE TAKE ALL NEW MEDICATIONS AS PRESCRIBED.   Activity: Increase activity slowly as tolerated. You may shower, but no soaking baths for 6 days. No driving for 1 day. No lifting over 5 lbs for 6 days. No sexual activity for 6 days.   You May Return to Work: in 6 days  Wound Care: You may wash cath site gently with soap and water. Keep cath site clean and dry. If you notice pain, swelling, bleeding or pus at your cath site, please call [phone].

## 2011-05-09 NOTE — Progress Notes (Signed)
Progress Note  Name:  Andrew Watson  Date:  05/09/2011 9:38 AM    Subjective:  Notes minimal chest pain with deep breath.  No dyspnea.   Objective:   Filed Vitals:   05/08/11 1600 05/08/11 2000 05/08/11 2202 05/09/11 0500  BP: 104/59 135/87 116/74 105/68  Pulse:   90 82  Temp: 97.8 F (36.6 C) 98 F (36.7 C) 98 F (36.7 C) 97.4 F (36.3 C)  TempSrc: Oral Oral Oral Oral  Resp: 16 18 18 18   Height:      Weight:   223 lb (101.152 kg)   SpO2: 96% 96% 96% 94%    Intake/Output Summary (Last 24 hours) at 05/09/11 0938 Last data filed at 05/09/11 0700  Gross per 24 hour  Intake   1122 ml  Output   1485 ml  Net   -363 ml    Tele:  Sinus rhythm  PHYSICAL EXAM General: no acute distress Neck:  No JVD Lungs: Clear to auscultation bilaterally  Heart: normal S1, S2;  Regular rate and rhythm, no murmur, no rub Abdomen: soft, nontender Extremities: no edema Skin:  warm and dry Psych:  Normal affect  Labs:  [facility] 05/08/11 1132 05/08/11 0531  NA 137 137  K 3.7 3.8  CL 103 105  CO2 25 25  GLUCOSE 88 99  BUN 18 19  CREATININE 0.91 0.88  CALCIUM 9.1 8.6  MG -- --  PHOS -- --    Basename 05/08/11 0531  AST 15  ALT 14  ALKPHOS 38*  BILITOT 2.8*  PROT 6.0  ALBUMIN 3.2*   No results found for this basename: LIPASE:2,AMYLASE:2 in the last 72 hours  Basename 05/09/11 0715 05/08/11 0531  WBC 5.8 6.7  NEUTROABS -- --  HGB 12.6* 12.8*  HCT 37.6* 37.7*  MCV 92.2 92.4  PLT 117* 99*    Basename 05/08/11 0610 2011/06/01 2138 2011-06-01 0847 Jun 01, 2011 0512  CKTOTAL 59 72 185 --  CKMB 1.5 1.6 2.8 --  TROPONINI <0.30 <0.30 <0.30 <0.30   No components found with this basename: POCBNP:3  Basename 06-01-11 0512  DDIMER 0.44   No results found for this basename: HGBA1C in the last 72 hours  Basename 05/09/11 0715  CHOL 112  HDL 47  LDLCALC 50  TRIG 73  CHOLHDL 2.4    Basename 05/08/11 1132  TSH 13.000*  T4TOTAL --  T3FREE 2.0*  THYROIDAB --  Free T4         0.66  No results found for this basename: VITAMINB12,FOLATE,FERRITIN,TIBC,IRON,RETICCTPCT in the last 72 hours  Radiology/Studies:  Chest Portable 1 View  06/01/11   IMPRESSION: No acute cardiopulmonary process seen.  Apparent left-sided nipple shadow noted; would suggest repeat chest radiograph with nipple markers, to ensure that this reflects the nipple shadow.  Original Report Authenticated By: Tonia Ghent, M.D.   Cardiac Cath 06/01/11: Coronary angiography:  Coronary dominance: left  Left mainstem: The LAD and circumflex have separate ostia.  Left anterior descending (LAD): The LAD is a large vessel with scattered irregularities in the mid vessel up to 10-20%.  Left circumflex (LCx): The circumflex is a large dominant vessel. There is 10-20% narrowing after the first obtuse marginal vessel.  Right coronary artery (RCA): This is a small nondominant vessel and is normal.  Left ventriculography:N/A  Final Conclusions:  1. Minimal nonobstructive atherosclerotic coronary disease.  2. Normal left ventricular filling pressures.  Recommendations: Evaluate other causes for his acute chest pain.  2D Echo 06/01/2011: Study Conclusions  -  Left ventricle: The cavity size was normal. Wall thickness was increased in a pattern of mild LVH. Systolic function was normal. The estimated ejection fraction was in the range of 55% to 60%. Wall motion was normal; there were no regional wall motion abnormalities. - Aortic valve: Mildly to moderately calcified annulus. Mildly thickened, mildly calcified leaflets. Cusp separation was mildly reduced. There was mild stenosis. Mild regurgitation. Valve area: 1.25cm^2(VTI). Valve area: 1.09cm^2 (Vmax). - Right ventricle: The cavity size was normal. Wall thickness was mildly increased. - Atrial septum: No defect or patent foramen ovale was identified.    Assessment and Plan:  1. Chest pain Probably pericarditis.  Minimal CAD on cath.  D/c on  Ibuprofen 600 mg bid for 1-2 weeks.  Will give Protonix for GI protection.  Follow up with Dr. Gridley Bing in [place] in 2 weeks.  2. Paroxysmal atrial fibrillation Low CHADS2 score.  D/c on ASA 325 mg QD and Toprol XL 25 mg QD.  Follow up with Dr.  Bing in [place] in 2 weeks.  3. Hypothyroidism Start Synthroid 50 mcg QD.  Needs follow up with Dr. Margo Aye in 3-4 weeks for monitoring.  Luna Glasgow, PA-C  9:38 AM 05/09/2011     Patient seen and examined with Tereso Newcomer PA-C. We discussed all aspects of the encounter. I agree with the assessment and plan as stated above. Cath films reviewed. No significant CAD. ECG looks like probable pericarditis but no rub on exam - ? Pleuorpericarditis. No suggestion of PE at this point. RV normal on echo.   Has new PAF and hypothyroidism. Has been started on ASA 325. (CHADS 1). Will treat with ibuprofen 600 bid x 1 week. If CP persists can add colchicine. Start low dose synthroid. F/u Drs. Rothbart and Hall.   Jaleil Renwick,MD 10:24 AM

## 2011-05-09 NOTE — Discharge Summary (Signed)
Discharge Summary   Patient ID: Andrew Watson,  MRN: [id number], DOB/AGE: Jan 28, 1940 71 y.o.  Admit date: 05/07/2011 Discharge date: 05/09/2011  Discharge Diagnoses Principal Problem:  *Pericarditis Active Problems:  Paroxysmal atrial fibrillation  Hypothyroidism   Allergies No Known Allergies  Diagnostic Studies/Procedures  PORTABLE CHEST X-RAY 1 VIEW- 05/07/11  Comparison: None.  Findings: The lungs are well-aerated and clear. There is no  evidence of focal opacification, pleural effusion or pneumothorax.  An apparent left-sided nipple shadow is noted.  The cardiomediastinal silhouette is within normal limits. No acute  osseous abnormalities are seen.  IMPRESSION:  No acute cardiopulmonary process seen. Apparent left-sided nipple  shadow noted; would suggest repeat chest radiograph with nipple  markers, to ensure that this reflects the nipple shadow.  2D ECHOCARDIOGRAM WITHOUT CONTRAST- 05/07/11  Study Conclusions  - Left ventricle: The cavity size was normal. Wall thickness was increased in a pattern of mild LVH. Systolic function was normal. The estimated ejection fraction was in the range of 55% to 60%. Wall motion was normal; there were no regional wall motion abnormalities.  - Aortic valve: Mildly to moderately calcified annulus. Mildly thickened, mildly calcified leaflets. Cusp separation was mildly reduced. There was mild stenosis. Mild regurgitation. Valve area: 1.25cm^2(VTI). Valve area: 1.09cm^2 (Vmax). - Right ventricle: The cavity size was normal. Wall thickness was mildly increased. - Atrial septum: No defect or patent foramen ovale was identified. Transthoracic echocardiography. M-mode, complete 2D, spectral Doppler, and color Doppler. Height: Height: 182.9cm. Height: 72in. Weight: Weight: 102.1kg. Weight: 224.5lb. Body mass index: BMI: 30.5kg/m^2. Body surface area: BSA: 2.72m^2. Patient status: Outpatient. Location: Emergency  department. ------------------------------------------------------------ ------------------------------------------------------------ Left ventricle: The cavity size was normal. Wall thickness was increased in a pattern of mild LVH. Systolic function was normal. The estimated ejection fraction was in the range of 55% to 60%. Wall motion was normal; there were no regional wall motion abnormalities.  ------------------------------------------------------------ Aortic valve: Mildly to moderately calcified annulus. Mildly thickened, mildly calcified leaflets. Cusp separation was mildly reduced. Doppler: There was mild stenosis. Mild regurgitation. VTI ratio of LVOT to aortic valve: 0.44. Valve area: 1.25cm^2(VTI). Indexed valve area: 0.56cm^2/m^2 (VTI). Peak velocity ratio of LVOT to aortic valve: 0.38. Valve area: 1.09cm^2 (Vmax). Indexed valve area: 0.49cm^2/m^2 (Vmax). Mean gradient: 15mm Hg (S). Peak gradient: 25mm Hg (S). ------------------------------------------------------------ Aorta: Aortic root: The aortic root was normal in size. Ascending aorta: The ascending aorta was normal in size. Aortic arch: The aortic arch was normal in size. No atheroma visualized. ------------------------------------------------------------ Mitral valve: Structurally normal valve. Leaflet separation was normal. Doppler: Transvalvular velocity was within the normal range. There was no evidence for stenosis. No regurgitation. Peak gradient: 2mm Hg (D). ------------------------------------------------------------ Left atrium: The atrium was at the upper limits of normal in size.  ------------------------------------------------------------ Atrial septum: No defect or patent foramen ovale was identified. ------------------------------------------------------------ Right ventricle: The cavity size was normal. Wall thickness was mildly increased. Systolic function was  normal. ------------------------------------------------------------ Pulmonic valve: Structurally normal valve. Cusp separation was normal. Doppler: Transvalvular velocity was within the normal range. No regurgitation.  ------------------------------------------------------------ Tricuspid valve: Structurally normal valve. Leaflet separation was normal. Doppler: Transvalvular velocity was within the normal range. No regurgitation. ------------------------------------------------------------ Pulmonary artery: The main pulmonary artery was normal-sized. Systolic pressure could not be accurately estimated. ------------------------------------------------------------ Right atrium: The atrium was normal in size. ------------------------------------------------------------ Pericardium: There was no pericardial effusion. ------------------------------------------------------------ Systemic veins: Not visualized. ------------------------------------------------------------ 2D measurements Normal Doppler measurements Normal Left ventricle LVOT LVID ED, 44.2 mm 43-52 Peak vel, 95.9 cm/s ------  chord, S PLAX VTI, S 21.4 cm ------ LVID ES, 33.5 mm 23-38 Aortic valve chord, Peak vel, 250 cm/s ------ PLAX S FS, chord, 24 % >29 Mean vel, 178 cm/s ------ PLAX S LVPW, ED 12.4 mm ------ VTI, S 48.7 cm ------ IVS/LVPW 1.19 <1.3 Mean 15 mm Hg ------ ratio, ED gradient, Ventricular septum S IVS, ED 14.8 mm ------ Peak 25 mm Hg ------ LVOT gradient, Diam, S 19 mm ------ S Area 2.84 cm^2 ------ VTI ratio 0.44 ------ Aorta LVOT/AV Root diam, 33 mm ------ Area, VTI 1.25 cm^2 ------ ED Area index 0.56 cm^2/m ------ Left atrium (VTI) ^2 AP dim 37 mm ------ Peak vel 0.38 ------ AP dim 1.65 cm/m^2 <2.2 ratio, index LVOT/AV Right ventricle Area, Vmax 1.09 cm^2 ------ RVID ED, 43.6 mm 19-38 Area index 0.49 cm^2/m ------ PLAX (Vmax) ^2 Regurg PHT 473 ms ------ M-mode measurements Normal Mitral  valve Aorta Peak E vel 78.4 cm/s ------ Root diam, 27 mm 20-37 Peak A vel 73.4 cm/s ------ ED Decelerati 352 ms 150-23 Left atrium on time 0 AP dim, ES 47 mm 19-40 Peak 2 mm Hg ------ AP dim 2.1 cm/m^2 <2.2 gradient, index, ES D LA/Ao root 1.74 ------ Peak E/A 1.1 ------ ratio ratio  Cardiac Catheterization- 05/07/11  Procedure: Left Heart Cath, Selective Coronary Angiography  Indication: 71 year old white male who presents with symptoms of chest pain. ECG shows mild ST elevation in the lateral leads. Because of ongoing chest pain urgent cardiac catheterization was recommended. Prior echocardiogram showed normal LV function with no significant pericardial effusion.  Procedural Details: The right wrist was prepped, draped, and anesthetized with 1% lidocaine. Using the modified Seldinger technique, a 5 French sheath was introduced into the right radial artery. 2.5 mg of nicardipine was administered through the sheath, weight-based unfractionated heparin was administered intravenously. Standard Judkins catheters were used for selective coronary angiography and left ventriculography. Catheter exchanges were performed over an exchange length guidewire. There were no immediate procedural complications. A TR band was used for radial hemostasis at the completion of the procedure. The patient was transferred to the post catheterization recovery area for further monitoring.  Procedural Findings:  Hemodynamics:  AO 116/68 mmHg  LV 122 with an EDP of 22 mmHg  Coronary angiography:  Coronary dominance: left  Left mainstem: The LAD and circumflex have separate ostia.  Left anterior descending (LAD): The LAD is a large vessel with scattered irregularities in the mid vessel up to 10-20%.  Left circumflex (LCx): The circumflex is a large dominant vessel. There is 10-20% narrowing after the first obtuse marginal vessel.  Right coronary artery (RCA): This is a small nondominant vessel and is normal.  Left  ventriculography:N/A  Final Conclusions:  1. Minimal nonobstructive atherosclerotic coronary disease.  2. Normal left ventricular filling pressures.  Recommendations: Evaluate other causes for his acute chest pain.  History of Present Illness  Mr. Harkins is a 71yo male with no prior history of cardiac disease, PMHx significant for arthritis and gout who was admitted to [facility] hospital from [facility] to undergo diagnostic cardiac catheterization for suspected unstable angina.   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 He awoke at 1 AM the date of admission with profuse sweating. Later that morning, around 3 AM, he complained of L-side  with neck and L shoulder radiation, described as dull and intermittent. He denied aggravation on inspiration at the time. He endorsed aggravation on laying flat. At baseline, he is able to work in the yard and perform heavy  lifting. Upon ED presentation, he was given NTG, ASA and morphine with some relief, but returned. Initial EKG was without acute evidence of ischemia, however repeat tracings revealed TW flattening in V3 with nonspecific inferolateral changes. Initial cardiac biomarkers are negative. He apparently endorsed no prior tobacco abuse, no family history of CAD, no prior history of CAD, no cholesterol issue and he is not on any medications at home. On comprehensive review, the patient's pain was felt to be somewhat atypical, however his symptomatology was concerning for unstable angina, and the decision was made to proceed with cardiac catheterization. This was discussed with the patient including risk and benefits.   Hospital Course   He was transferred to [facility] hospital without incident. He underwent 2D echo prior to cath which is described above.  Notably, there was mild LVH/RVH, mild AS and LVEF 55-65%. The plan was to proceed with the cardiac catheterization. He was informed, consented and agreed to the procedure which was initiated via R wrist access. The  full details are described above. Minimal obstructive CAD was visualized and normal LV filling pressures were noted. The recommendation was made to pursue other cause for his acute chest pain. He tolerated the procedure well without complications.   Post-procedure, he was noted to have developed paroxysmal atrial fibrillation with intermittent conversion to NSR. He was placed on Heparin initially. TSH was ordered and found to be elevated. T3 and T4 were reflective of hypothyroidism. This was believed to have induced the patient's atrial fibrillation. The patient remained stable and was transferred from stepdown to telemetry. He remained asymptomatic overnight and in NSR.   Today, the patient is stable, in good condition and is asymptomatic. He has ambulated well. Cardiac cath site is without evidence of hematoma or pseudoaneurysm. He will be discharged home today. There were varying opinions on whether the patient's chest pain was caused by pericarditis or was muskuloskeletal origin. Regardless, he will be started on ibuprofen 600mg  x 1-2 weeks, Protonix to rule out GI source of chest pain, ASA for anticoagulation (CHADS2 of 1), Toprol for rate-control and Synthroid for newly diagnosed hypothyroidism. This will be started at a low-dose. Should his chest pain persist, colchicine may be considered. He will need to follow-up with his PCP regarding up-titration of his meds and following his thyroid function. He will follow-up with Dr. Dietrich Pates in 1-2 weeks to follow-up on the patient's chest pain and management of atrial fibrillation. The office will call the patient with the appointment date and time. This information, including activity restrictions has been clearly outlined in the discharge AVS.   Today,   Discharge Vitals:  Blood pressure 105/68, pulse 82, temperature 97.4 F (36.3 C), temperature source Oral, resp. rate 18, height 6' (1.829 m), weight 101.152 kg (223 lb), SpO2 94.00%.   Weight change:  -0.348 kg (-12.3 oz)  Labs: Recent Labs  [facility] 05/09/11 0715 05/08/11 0531   WBC 5.8 6.7   HGB 12.6* 12.8*   HCT 37.6* 37.7*   MCV 92.2 92.4   PLT 117* 99*    Recent Labs  Basename 05/07/11 0512   DDIMER 0.44    Lab 05/08/11 1132 05/08/11 0531 05/07/11 0512  NA 137 137 139  K 3.7 3.8 4.2  CL 103 105 105  CO2 25 25 26   BUN 18 19 18   CREATININE 0.91 0.88 0.93  CALCIUM 9.1 8.6 9.1  PROT -- 6.0 --  BILITOT -- 2.8* --  ALKPHOS -- 38* --  ALT -- 14 --  AST -- 15 --  AMYLASE -- -- --  LIPASE -- -- --  GLUCOSE 88 99 115*    Recent Labs  Basename 05/08/11 0610 05/07/11 2138 05/07/11 0847   CKTOTAL 59 72 185   CKMB 1.5 1.6 2.8   CKMBINDEX -- -- --   TROPONINI <0.30 <0.30 <0.30    Recent Labs  Basename 05/09/11 0715   CHOL 112   HDL 47   LDLCALC 50   TRIG 73   CHOLHDL 2.4   LDLDIRECT --    Basename 05/08/11 1132  TSH 13.000*  T4TOTAL --  T3FREE 2.0*  THYROIDAB --   Disposition:   Follow-up Information    Follow up with Greeley Bing, MD in 2 weeks. (The office will call with appointment date and time. )    Contact information:   [address] [place] [address] [phone]       Follow up with [facility], MD. Schedule an appointment as soon as possible for a visit in 3 weeks.   Contact information:   [address] [place] [address] [phone]          Discharge Medications:  Medication List  As of 05/09/2011 11:45 AM   START taking these medications         ibuprofen 600 MG tablet   Commonly known as: ADVIL,MOTRIN   Take 1 tablet (600 mg total) by mouth 2 (two) times daily. Take with food.      levothyroxine 50 MCG tablet   Commonly known as: SYNTHROID, LEVOTHROID   Take 1 tablet (50 mcg total) by mouth every morning.      metoprolol succinate 25 MG 24 hr tablet   Commonly known as: TOPROL-XL   Take 1 tablet (25 mg total) by mouth daily.      pantoprazole 40 MG tablet   Commonly known as:  PROTONIX   Take 1 tablet (40 mg total) by mouth daily.         CHANGE how you take these medications         aspirin 325 MG EC tablet   Take 1 tablet (325 mg total) by mouth daily.   What changed: - medication strength - dose         CONTINUE taking these medications         peg 3350 powder Solr   Commonly known as: MOVIPREP   Take 1 kit (100 g total) by mouth once.          Where to get your medications    These are the prescriptions that you need to pick up. We sent them to a specific pharmacy, so you will need to go there to get them.   [facility] - [place], [place] - [address]    [address] [place] [address]    Phone: [phone]        aspirin 325 MG EC tablet   ibuprofen 600 MG tablet   levothyroxine 50 MCG tablet   metoprolol succinate 25 MG 24 hr tablet   pantoprazole 40 MG tablet           Outstanding Labs/Studies: None  Duration of Discharge Encounter: Greater than 30 minutes including physician time.  Signed, R. Hurman Horn, PA-C 05/09/2011, 11:45 AM    Patient seen and examined earlier today. Please see my progress note. Agree with above. Ok for discharge.  Malaisha Silliman,MD 7:50 AM

## 2011-05-09 NOTE — Progress Notes (Signed)
DC'd pt home with DTR.  RN reviewed DC instructions and med rec with pt and DTR.  RN removed IV with catheter intact. Pt and DTR verbalized understanding of follow up appointments with MD.  [facility]

## 2011-05-11 MED FILL — Nicardipine HCl IV Soln 2.5 MG/ML: INTRAVENOUS | Qty: 1 | Status: AC

## 2011-05-15 ENCOUNTER — Ambulatory Visit (INDEPENDENT_AMBULATORY_CARE_PROVIDER_SITE_OTHER): Payer: Medicare Other | Admitting: Adult Health

## 2011-05-15 ENCOUNTER — Encounter: Payer: Self-pay | Admitting: Adult Health

## 2011-05-15 VITALS — BP 121/84 | HR 81 | Resp 16 | Ht 72.0 in | Wt 225.0 lb

## 2011-05-15 DIAGNOSIS — I319 Disease of pericardium, unspecified: Secondary | ICD-10-CM

## 2011-05-15 DIAGNOSIS — G4700 Insomnia, unspecified: Secondary | ICD-10-CM

## 2011-05-15 DIAGNOSIS — I480 Paroxysmal atrial fibrillation: Secondary | ICD-10-CM

## 2011-05-15 DIAGNOSIS — E039 Hypothyroidism, unspecified: Secondary | ICD-10-CM

## 2011-05-15 DIAGNOSIS — I4891 Unspecified atrial fibrillation: Secondary | ICD-10-CM

## 2011-05-15 MED ORDER — ZOLPIDEM TARTRATE 5 MG PO TABS: 2.5000 mg | ORAL_TABLET | Freq: Every evening | ORAL | Status: DC | PRN

## 2011-05-15 NOTE — Patient Instructions (Addendum)
**Note De-Identified Andrew Watson Obfuscation** Your physician has recommended you make the following change in your medication: start taking Ambien 2.5 mg as needed for sleep  Your physician recommends that you schedule a follow-up appointment in: as needed  Please schedule an appointment to follow up with Dr. Margo Aye, your primary care MD, as soon as possible.

## 2011-05-15 NOTE — Assessment & Plan Note (Addendum)
This appeared to be related to hypothyroidism and is now being treated with thyroid replacement medications. He is to follow-up with Dr. Margo Aye for ongoing assessment and medication adjustments.  He is advised to make an appointment for this. With his age, he can certainly have reoccurance of this in the future. Will continue ASA as directed. Echocardiogram showed that left atrium was at the upper limits of normal. Mild LVH is noted. Repeat echo should be completed in 6 months.

## 2011-05-15 NOTE — Assessment & Plan Note (Signed)
He is currently pain free with use of anti-inflammatory medications and is back to normal activities. He is reassured that he had insignificant blockages noted per cardiac cath. He will continue on current medications and see [place] on prn basis.

## 2011-05-15 NOTE — Assessment & Plan Note (Signed)
As above.  He will follow-up with PCP

## 2011-05-15 NOTE — Assessment & Plan Note (Signed)
Low dose of ambien 2.5 mg is provided with NO refills. He is to follow-up with PCP for refills prn.

## 2011-05-15 NOTE — Progress Notes (Signed)
   HPI: Andrew Watson is a 71 y/o patient of Dr. Dietrich Pates who we are seeing post hospitalization for chest pain and subsequent transfer to [facility] for cardiac catheterization. He was found to have normal LV fx and nonobstructive CAD per cardiac catheterization. He was found also to be atrial fibrillation and hypothyroid. There was a question of pericarditis vs GI origin for chest discomfort. He is without complaint of chest pain now. He is complaining of insomnia and is concerned that it may be related to medications.  No Known Allergies  Current Outpatient Prescriptions  Medication Sig Dispense Refill  . aspirin EC 325 MG EC tablet Take 1 tablet (325 mg total) by mouth daily.  30 tablet  3  . ibuprofen (ADVIL) 600 MG tablet Take 1 tablet (600 mg total) by mouth 2 (two) times daily. Take with food.  28 tablet  0  . levothyroxine (SYNTHROID) 50 MCG tablet Take 1 tablet (50 mcg total) by mouth every morning.  30 tablet  3  . metoprolol succinate (TOPROL XL) 25 MG 24 hr tablet Take 1 tablet (25 mg total) by mouth daily.  30 tablet  3  . pantoprazole (PROTONIX) 40 MG tablet Take 1 tablet (40 mg total) by mouth daily.  30 tablet  3    Past Medical History  Diagnosis Date  . Arthritis   . Gout     Past Surgical History  Procedure Date  . Knee arthroscopy     ZOX:WRUEAV of systems complete and found to be negative unless listed above PHYSICAL EXAM BP 121/84  Pulse 81  Resp 16  Ht 6' (1.829 m)  Wt 225 lb (102.059 kg)  BMI 30.52 kg/m2  General: Well developed, well nourished, in no acute distress Head: Eyes PERRLA, No xanthomas.   Normal cephalic and atramatic  Lungs: Clear bilaterally to auscultation and percussion. Heart: HRRR S1 S2, without MRG.  Pulses are 2+ & equal.            No carotid bruit. No JVD.  No abdominal bruits. No femoral bruits. Abdomen: Bowel sounds are positive, abdomen soft and non-tender without masses or                  Hernia's noted. Msk:  Back  normal, normal gait. Normal strength and tone for age. Extremities: No clubbing, cyanosis or edema.  DP +1. Right groin free of bruising or hematoma. Neuro: Alert and oriented X 3. Psych:  Good affect, responds appropriately  ASSESSMENT AND PLAN

## 2011-05-18 ENCOUNTER — Telehealth: Payer: Self-pay

## 2011-05-18 NOTE — Telephone Encounter (Signed)
**Note De-Identified Anneliese Leblond Obfuscation** Pt. had to leave quickly after OV with Joni Reining, NP on Friday 4-19 and stated he would come by office later that day to pick up RX and AVS but did not. Pt's wife is advised that Amdien RX and AVS from Friday's visit is ready for pick up, she verbalized understanding./LV

## 2011-05-20 ENCOUNTER — Telehealth: Payer: Self-pay | Admitting: Adult Health

## 2011-05-20 NOTE — Telephone Encounter (Signed)
Patient states that he thinks he is having a reaction to medications.  Not sure which one.  States that it is making his "skin crawl" "constantly uncomfortable".  States that he can not see his PCP for 2 weeks. / tg

## 2011-05-20 NOTE — Telephone Encounter (Addendum)
**Note De-Identified Joyclyn Plazola Obfuscation** Pt. denies rash or itching he c/o an "antsy feeling" that makes his skin crawl all the time since starting new meds at hosp. discharge on 4-13. Pt's discharge medications are as follows: Ibuprofen 600 mg BID Levothyroxine 50 mcg QAM Pantoprazole 40 mg QD Metoprolol Succ. 25 mg QD Pt. states that he will also call his PCP, Dr. Margo Aye, for advise as most of his meds are not cardiac meds. Please advise./LV

## 2011-05-21 NOTE — Telephone Encounter (Signed)
Agreed.  PCP should address. Thx  Samara Deist

## 2011-05-21 NOTE — Telephone Encounter (Signed)
**Note De-Identified Andrew Watson Obfuscation** Left detailed message on VM advising pt. to contact PCP./LV

## 2011-05-27 ENCOUNTER — Telehealth (INDEPENDENT_AMBULATORY_CARE_PROVIDER_SITE_OTHER): Payer: Self-pay | Admitting: *Deleted

## 2011-05-27 NOTE — Telephone Encounter (Signed)
PCP/Requesting MD: zack hall  Name & DOB: tyreece gelles  04-07-2040     Procedure: tcs  Reason/Indication:  screening  Has patient had this procedure before?  yes  If so, when, by whom and where?  More than 10 yrs ago  Is there a family history of colon cancer?  no  Who?  What age when diagnosed?    Is patient diabetic?   no      Does patient have prosthetic heart valve?  no  Do you have a pacemaker?  no  Has patient had joint replacement within last 12 months?  no  Is patient on Coumadin, Plavix and/or Aspirin? yes  Medications: asa 81 mg daily, advil prn   Allergies: nkda  Medication Adjustment: asa 2 days  Procedure date & time: 06/17/11

## 2011-05-28 NOTE — Telephone Encounter (Signed)
agree

## 2011-06-09 ENCOUNTER — Telehealth (INDEPENDENT_AMBULATORY_CARE_PROVIDER_SITE_OTHER): Payer: Self-pay | Admitting: *Deleted

## 2011-06-09 NOTE — Telephone Encounter (Signed)
Patient will be out of town and needs to reschedule his TCS. The TCS is scheduled for 06/17/11 and needs to cancel. Please call [place] at [phone] or [phone] to reschedule.

## 2011-06-10 ENCOUNTER — Telehealth (INDEPENDENT_AMBULATORY_CARE_PROVIDER_SITE_OTHER): Payer: Self-pay | Admitting: *Deleted

## 2011-06-10 ENCOUNTER — Encounter (INDEPENDENT_AMBULATORY_CARE_PROVIDER_SITE_OTHER): Payer: Self-pay | Admitting: *Deleted

## 2011-06-10 NOTE — Telephone Encounter (Signed)
PCP/Requesting MD: zack hall  Name & DOB: sherrick araki 08/21/2040     Procedure: tcs  Reason/Indication:  screening  Has patient had this procedure before?  yes  If so, when, by whom and where?  More than 10 yrs ago  Is there a family history of colon cancer?  no  Who?  What age when diagnosed?  \  Is patient diabetic?   no      Does patient have prosthetic heart valve?  no  Do you have a pacemaker?  no  Has patient had joint replacement within last 12 months?  no  Is patient on Coumadin, Plavix and/or Aspirin? yes  Medications: asa 81 mg daily, advil prn  Allergies: nkda  Medication Adjustment: asa 2 days  Procedure date & time: 07/08/11 @ 1030

## 2011-06-15 NOTE — Telephone Encounter (Signed)
TCS resch;d to 07/08/11, patient aware

## 2011-06-16 NOTE — Telephone Encounter (Signed)
agree

## 2011-07-07 ENCOUNTER — Other Ambulatory Visit (INDEPENDENT_AMBULATORY_CARE_PROVIDER_SITE_OTHER): Payer: Self-pay | Admitting: *Deleted

## 2011-07-07 DIAGNOSIS — Z1211 Encounter for screening for malignant neoplasm of colon: Secondary | ICD-10-CM

## 2011-07-07 MED ORDER — SODIUM CHLORIDE 0.45 % IV SOLN
Freq: Once | INTRAVENOUS | Status: AC
  Administered 2011-07-08: 1000 mL via INTRAVENOUS

## 2011-07-08 ENCOUNTER — Encounter (HOSPITAL_COMMUNITY)
Admission: RE | Disposition: A | Discharge status: Home / Self Care | Payer: Self-pay | Source: Ambulatory Visit | Attending: Internal Medicine

## 2011-07-08 ENCOUNTER — Ambulatory Visit (HOSPITAL_COMMUNITY)
Admission: RE | Admit: 2011-07-08 | Discharge: 2011-07-08 | Disposition: A | Discharge status: Home / Self Care | Payer: Medicare Other | Source: Ambulatory Visit | Attending: Internal Medicine | Admitting: Internal Medicine

## 2011-07-08 ENCOUNTER — Encounter (HOSPITAL_COMMUNITY): Payer: Self-pay | Admitting: *Deleted

## 2011-07-08 DIAGNOSIS — D126 Benign neoplasm of colon, unspecified: Secondary | ICD-10-CM

## 2011-07-08 DIAGNOSIS — K644 Residual hemorrhoidal skin tags: Secondary | ICD-10-CM

## 2011-07-08 DIAGNOSIS — Z1211 Encounter for screening for malignant neoplasm of colon: Secondary | ICD-10-CM

## 2011-07-08 DIAGNOSIS — K5730 Diverticulosis of large intestine without perforation or abscess without bleeding: Secondary | ICD-10-CM

## 2011-07-08 HISTORY — PX: COLONOSCOPY: SHX5424

## 2011-07-08 SURGERY — COLONOSCOPY
Anesthesia: Moderate Sedation

## 2011-07-08 MED ORDER — ASPIRIN EC 81 MG PO TBEC: 81.0000 mg | DELAYED_RELEASE_TABLET | Freq: Every day | ORAL | Status: DC

## 2011-07-08 MED ORDER — MIDAZOLAM HCL 5 MG/5ML IJ SOLN
INTRAMUSCULAR | Status: AC
  Filled 2011-07-08: qty 10

## 2011-07-08 MED ORDER — MEPERIDINE HCL 50 MG/ML IJ SOLN
INTRAMUSCULAR | Status: DC | PRN
  Administered 2011-07-08 (×2): 25 mg via INTRAVENOUS

## 2011-07-08 MED ORDER — STERILE WATER FOR IRRIGATION IR SOLN
Status: DC | PRN
  Administered 2011-07-08: 11:00:00

## 2011-07-08 MED ORDER — MEPERIDINE HCL 50 MG/ML IJ SOLN
INTRAMUSCULAR | Status: AC
  Filled 2011-07-08: qty 1

## 2011-07-08 MED ORDER — MIDAZOLAM HCL 5 MG/5ML IJ SOLN
INTRAMUSCULAR | Status: DC | PRN
  Administered 2011-07-08: 2 mg via INTRAVENOUS

## 2011-07-08 NOTE — Op Note (Signed)
COLONOSCOPY PROCEDURE REPORT  PATIENT:  Andrew Watson  MR#:  [id number] Birthdate:  1940-07-10, 71 y.o., male Endoscopist:  Dr. Malissa Hippo, MD Referred By:  Dr. Catalina Pizza, MD  Procedure Date: 07/08/2011  Procedure:   Colonoscopy  Indications:  Average risk screening colonoscopy. Last exam was 10 years ago.  Informed Consent:  The procedure and risks were reviewed with the patient and informed consent was obtained.  Medications:  Demerol 50 mg IV Versed 2  mg IV  Description of procedure:  After a digital rectal exam was performed, that colonoscope was advanced from the anus through the rectum and colon to the area of the cecum, ileocecal valve and appendiceal orifice. The cecum was deeply intubated. These structures were well-seen and photographed for the record. From the level of the cecum and ileocecal valve, the scope was slowly and cautiously withdrawn. The mucosal surfaces were carefully surveyed utilizing scope tip to flexion to facilitate fold flattening as needed. The scope was pulled down into the rectum where a thorough exam including retroflexion was performed.  Findings:   Prep satisfactory. Single diverticulum at sigmoid colon. 3 mm polyp ablated via cold biopsy from splenic flexure. Normal rectal mucosa. Small hemorrhoids below the dentate line.  Therapeutic/Diagnostic Maneuvers Performed:  See above  Complications:  None  Cecal Withdrawal Time:  13 minutes  Impression:  Examination performed to cecum. 3 mm  polyp ablated via cold biopsy from splenic flexure. Single diverticulum at sigmoid colon. Small external hemorrhoids.  Recommendations:  Standard instructions given. I will contact patient with biopsy results. Given today's findings his next screening would be in 10 years.  Andrew Watson  07/08/2011 11:30 AM  CC: Dr. [name], MD & Dr. Bonnetta Barry ref. provider found

## 2011-07-08 NOTE — H&P (Signed)
Andrew Watson is an 71 y.o. male.   Chief Complaint: Patient is here for colonoscopy. HPI: Patient is 72 year old Caucasian male who is here for average risk screening colonoscopy. Patient's last exam was 10 years ago. He denies abdominal pain change in his bowel habits or rectal bleeding. Family history is negative for colorectal carcinoma.  Past Medical History  Diagnosis Date  . Arthritis   . Gout     Past Surgical History  Procedure Date  . Knee arthroscopy   . Back surgery     No family history on file. Social History:  reports that he has never smoked. He does not have any smokeless tobacco history on file. He reports that he drinks alcohol. He reports that he does not use illicit drugs.  Allergies: No Known Allergies  Medications Prior to Admission  Medication Sig Dispense Refill  . levothyroxine (SYNTHROID) 50 MCG tablet Take 1 tablet (50 mcg total) by mouth every morning.  30 tablet  3  . zolpidem (AMBIEN) 5 MG tablet Take 0.5 tablets (2.5 mg total) by mouth at bedtime as needed for sleep.  30 tablet  0  . metoprolol succinate (TOPROL XL) 25 MG 24 hr tablet Take 1 tablet (25 mg total) by mouth daily.  30 tablet  3  . pantoprazole (PROTONIX) 40 MG tablet Take 1 tablet (40 mg total) by mouth daily.  30 tablet  3    No results found for this or any previous visit (from the past 48 hour(s)). No results found.  ROS  Blood pressure 124/72, pulse 85, temperature 97.5 F (36.4 C), temperature source Oral, resp. rate 20, height 6' (1.829 m), weight 225 lb (102.059 kg), SpO2 96.00%. Physical Exam   Assessment/Plan Average risk screening colonoscopy.  Delany Steury U 07/08/2011, 10:51 AM

## 2011-07-08 NOTE — Discharge Instructions (Signed)
Resume usual medications and diet. No driving for 24 hours. Physician will contact you with biopsy results.  Colonoscopy Care After Read the instructions outlined below and refer to this sheet in the next few weeks. These discharge instructions provide you with general information on caring for yourself after you leave the hospital. Your doctor may also give you specific instructions. While your treatment has been planned according to the most current medical practices available, unavoidable complications occasionally occur. If you have any problems or questions after discharge, [place] your doctor. HOME CARE INSTRUCTIONS ACTIVITY:  You may resume your regular activity, but move at a slower pace for the next 24 hours.   Take frequent rest periods for the next 24 hours.   Walking will help get rid of the air and reduce the bloated feeling in your belly (abdomen).   No driving for 24 hours (because of the medicine (anesthesia) used during the test).   You may shower.   Do not sign any important legal documents or operate any machinery for 24 hours (because of the anesthesia used during the test).  NUTRITION:  Drink plenty of fluids.   You may resume your normal diet as instructed by your doctor.   Begin with a light meal and progress to your normal diet. Heavy or fried foods are harder to digest and may make you feel sick to your stomach (nauseated).   Avoid alcoholic beverages for 24 hours or as instructed.  MEDICATIONS:  You may resume your normal medications unless your doctor tells you otherwise.  WHAT TO EXPECT TODAY:  Some feelings of bloating in the abdomen.   Passage of more gas than usual.   Spotting of blood in your stool or on the toilet paper.  IF YOU HAD POLYPS REMOVED DURING THE COLONOSCOPY:  No aspirin products for 7 days or as instructed.   No alcohol for 7 days or as instructed.   Eat a soft diet for the next 24 hours.  FINDING OUT THE RESULTS OF YOUR  TEST Not all test results are available during your visit. If your test results are not back during the visit, make an appointment with your caregiver to find out the results. Do not assume everything is normal if you have not heard from your caregiver or the medical facility. It is important for you to follow up on all of your test results.  SEEK IMMEDIATE MEDICAL CARE IF:  You have more than a spotting of blood in your stool.   Your belly is swollen (abdominal distention).   You are nauseated or vomiting.   You have a fever.   You have abdominal pain or discomfort that is severe or gets worse throughout the day.  Document Released: 08/27/2003 Document Revised: 01/01/2011 Document Reviewed: 08/25/2007 [facility] Patient Information 2012 [place].  Colon Polyps A polyp is extra tissue that grows inside your body. Colon polyps grow in the large intestine. The large intestine, also called the colon, is part of your digestive system. It is a long, hollow tube at the end of your digestive tract where your body makes and stores stool. Most polyps are not dangerous. They are benign. This means they are not cancerous. But over time, some types of polyps can turn into cancer. Polyps that are smaller than a pea are usually not harmful. But larger polyps could someday become or may already be cancerous. To be safe, doctors remove all polyps and test them.  WHO GETS POLYPS? Anyone can get polyps,  but certain people are more likely than others. You may have a greater chance of getting polyps if:  You are over 50.   You have had polyps before.   Someone in your family has had polyps.   Someone in your family has had cancer of the large intestine.   Find out if someone in your family has had polyps. You may also be more likely to get polyps if you:   Eat a lot of fatty foods.   Smoke.   Drink alcohol.   Do not exercise.   Eat too much.  SYMPTOMS  Most small polyps do not cause  symptoms. People often do not know they have one until their caregiver finds it during a regular checkup or while testing them for something else. Some people do have symptoms like these:  Bleeding from the anus. You might notice blood on your underwear or on toilet paper after you have had a bowel movement.   Constipation or diarrhea that lasts more than a week.   Blood in the stool. Blood can make stool look black or it can show up as red streaks in the stool.  If you have any of these symptoms, see your caregiver. HOW DOES THE DOCTOR TEST FOR POLYPS? The doctor can use four tests to check for polyps:  Digital rectal exam. The caregiver wears gloves and checks your rectum (the last part of the large intestine) to see if it feels normal. This test would find polyps only in the rectum. Your caregiver may need to do one of the other tests listed below to find polyps higher up in the intestine.   Barium enema. The caregiver puts a liquid called barium into your rectum before taking x-rays of your large intestine. Barium makes your intestine look white in the pictures. Polyps are dark, so they are easy to see.   Sigmoidoscopy. With this test, the caregiver can see inside your large intestine. A thin flexible tube is placed into your rectum. The device is called a sigmoidoscope, which has a light and a tiny video camera in it. The caregiver uses the sigmoidoscope to look at the last third of your large intestine.   Colonoscopy. This test is like sigmoidoscopy, but the caregiver looks at all of the large intestine. It usually requires sedation. This is the most common method for finding and removing polyps.  TREATMENT   The caregiver will remove the polyp during sigmoidoscopy or colonoscopy. The polyp is then tested for cancer.   If you have had polyps, your caregiver may want you to get tested regularly in the future.  PREVENTION  There is not one sure way to prevent polyps. You might be able to  lower your risk of getting them if you:  Eat more fruits and vegetables and less fatty food.   Do not smoke.   Avoid alcohol.   Exercise every day.   Lose weight if you are overweight.   Eating more calcium and folate can also lower your risk of getting polyps. Some foods that are rich in calcium are milk, cheese, and broccoli. Some foods that are rich in folate are chickpeas, kidney beans, and spinach.   Aspirin might help prevent polyps. Studies are under way.  Document Released: 10/09/2003 Document Revised: 01/01/2011 Document Reviewed: 03/16/2007 [facility] Patient Information 2012 [place].  Hemorrhoids Hemorrhoids are enlarged (dilated) veins around the rectum. There are 2 types of hemorrhoids, and the type of hemorrhoid is determined by its location.  Internal hemorrhoids occur in the veins just inside the rectum.They are usually not painful, but they may bleed.However, they may poke through to the outside and become irritated and painful. External hemorrhoids involve the veins outside the anus and can be felt as a painful swelling or hard lump near the anus.They are often itchy and may crack and bleed. Sometimes clots will form in the veins. This makes them swollen and painful. These are called thrombosed hemorrhoids. CAUSES Causes of hemorrhoids include:  Pregnancy. This increases the pressure in the hemorrhoidal veins.   Constipation.   Straining to have a bowel movement.   Obesity.   Heavy lifting or other activity that caused you to strain.  TREATMENT Most of the time hemorrhoids improve in 1 to 2 weeks. However, if symptoms do not seem to be getting better or if you have a lot of rectal bleeding, your caregiver may perform a procedure to help make the hemorrhoids get smaller or remove them completely.Possible treatments include:  Rubber band ligation. A rubber band is placed at the base of the hemorrhoid to cut off the circulation.   Sclerotherapy. A  chemical is injected to shrink the hemorrhoid.   Infrared light therapy. Tools are used to burn the hemorrhoid.   Hemorrhoidectomy. This is surgical removal of the hemorrhoid.  HOME CARE INSTRUCTIONS   Increase fiber in your diet. Ask your caregiver about using fiber supplements.   Drink enough water and fluids to keep your urine clear or pale yellow.   Exercise regularly.   Go to the bathroom when you have the urge to have a bowel movement. Do not wait.   Avoid straining to have bowel movements.   Keep the anal area dry and clean.   Only take over-the-counter or prescription medicines for pain, discomfort, or fever as directed by your caregiver.  If your hemorrhoids are thrombosed:  Take warm sitz baths for 20 to 30 minutes, 3 to 4 times per day.   If the hemorrhoids are very tender and swollen, place ice packs on the area as tolerated. Using ice packs between sitz baths may be helpful. Fill a plastic bag with ice. Place a towel between the bag of ice and your skin.   Medicated creams and suppositories may be used or applied as directed.   Do not use a donut-shaped pillow or sit on the toilet for long periods. This increases blood pooling and pain.  SEEK MEDICAL CARE IF:   You have increasing pain and swelling that is not controlled with your medicine.   You have uncontrolled bleeding.   You have difficulty or you are unable to have a bowel movement.   You have pain or inflammation outside the area of the hemorrhoids.   You have chills or an oral temperature above 102 F (38.9 C).  MAKE SURE YOU:   Understand these instructions.   Will watch your condition.   Will get help right away if you are not doing well or get worse.  Document Released: 01/10/2000 Document Revised: 01/01/2011 Document Reviewed: 05/17/2007 [facility] Patient Information 2012 [place].  Diverticulosis Diverticulosis is a common condition that develops when small pouches (diverticula) form  in the wall of the colon. The risk of diverticulosis increases with age. It happens more often in people who eat a low-fiber diet. Most individuals with diverticulosis have no symptoms. Those individuals with symptoms usually experience abdominal pain, constipation, or loose stools (diarrhea). HOME CARE INSTRUCTIONS   Increase the amount of fiber in  your diet as directed by your caregiver or dietician. This may reduce symptoms of diverticulosis.   Your caregiver may recommend taking a dietary fiber supplement.   Drink at least 6 to 8 glasses of water each day to prevent constipation.   Try not to strain when you have a bowel movement.   Your caregiver may recommend avoiding nuts and seeds to prevent complications, although this is still an uncertain benefit.   Only take over-the-counter or prescription medicines for pain, discomfort, or fever as directed by your caregiver.  FOODS WITH HIGH FIBER CONTENT INCLUDE:  Fruits. Apple, peach, pear, tangerine, raisins, prunes.   Vegetables. Brussels sprouts, asparagus, broccoli, cabbage, carrot, cauliflower, romaine lettuce, spinach, summer squash, tomato, winter squash, zucchini.   Starchy Vegetables. Baked beans, kidney beans, lima beans, split peas, lentils, potatoes (with skin).   Grains. Whole wheat bread, brown rice, bran flake cereal, plain oatmeal, white rice, shredded wheat, bran muffins.  SEEK IMMEDIATE MEDICAL CARE IF:   You develop increasing pain or severe bloating.   You have an oral temperature above 102 F (38.9 C), not controlled by medicine.   You develop vomiting or bowel movements that are bloody or black.  Document Released: 10/10/2003 Document Revised: 01/01/2011 Document Reviewed: 06/12/2009 [facility] Patient Information 2012 [place], [place].

## 2011-07-14 ENCOUNTER — Encounter (INDEPENDENT_AMBULATORY_CARE_PROVIDER_SITE_OTHER): Payer: Self-pay | Admitting: *Deleted

## 2011-07-16 ENCOUNTER — Encounter (HOSPITAL_COMMUNITY): Payer: Self-pay | Admitting: Internal Medicine

## 2013-12-12 ENCOUNTER — Other Ambulatory Visit (HOSPITAL_COMMUNITY): Payer: Self-pay | Admitting: Neurosurgery

## 2013-12-12 DIAGNOSIS — M5410 Radiculopathy, site unspecified: Secondary | ICD-10-CM

## 2013-12-13 ENCOUNTER — Ambulatory Visit (HOSPITAL_COMMUNITY)
Admission: RE | Admit: 2013-12-13 | Discharge: 2013-12-13 | Disposition: A | Discharge status: Home / Self Care | Payer: Medicare Other | Source: Ambulatory Visit | Attending: Neurosurgery | Admitting: Neurosurgery

## 2013-12-13 DIAGNOSIS — M2578 Osteophyte, vertebrae: Secondary | ICD-10-CM | POA: Diagnosis not present

## 2013-12-13 DIAGNOSIS — M4806 Spinal stenosis, lumbar region: Secondary | ICD-10-CM | POA: Insufficient documentation

## 2013-12-13 DIAGNOSIS — M5126 Other intervertebral disc displacement, lumbar region: Secondary | ICD-10-CM | POA: Diagnosis not present

## 2013-12-13 DIAGNOSIS — M545 Low back pain: Secondary | ICD-10-CM | POA: Insufficient documentation

## 2013-12-13 DIAGNOSIS — M5410 Radiculopathy, site unspecified: Secondary | ICD-10-CM

## 2013-12-13 LAB — POCT I-STAT CREATININE: CREATININE: 1.2 mg/dL (ref 0.50–1.35)

## 2013-12-13 MED ORDER — GADOBENATE DIMEGLUMINE 529 MG/ML IV SOLN
20.0000 mL | Freq: Once | INTRAVENOUS | Status: AC | PRN
  Administered 2013-12-13: 20 mL via INTRAVENOUS

## 2013-12-25 ENCOUNTER — Other Ambulatory Visit (HOSPITAL_COMMUNITY): Payer: Self-pay | Admitting: Neurosurgery

## 2013-12-25 DIAGNOSIS — M25552 Pain in left hip: Secondary | ICD-10-CM

## 2013-12-28 ENCOUNTER — Encounter (HOSPITAL_COMMUNITY): Payer: Self-pay

## 2013-12-28 ENCOUNTER — Ambulatory Visit (HOSPITAL_COMMUNITY)
Admission: RE | Admit: 2013-12-28 | Discharge: 2013-12-28 | Disposition: A | Discharge status: Home / Self Care | Payer: Medicare Other | Source: Ambulatory Visit | Attending: Neurosurgery | Admitting: Neurosurgery

## 2013-12-28 DIAGNOSIS — M79605 Pain in left leg: Secondary | ICD-10-CM | POA: Insufficient documentation

## 2013-12-28 DIAGNOSIS — M25552 Pain in left hip: Secondary | ICD-10-CM | POA: Diagnosis present

## 2013-12-28 DIAGNOSIS — M160 Bilateral primary osteoarthritis of hip: Secondary | ICD-10-CM | POA: Diagnosis not present

## 2013-12-28 DIAGNOSIS — R1032 Left lower quadrant pain: Secondary | ICD-10-CM | POA: Diagnosis not present

## 2014-01-04 ENCOUNTER — Encounter (HOSPITAL_COMMUNITY): Payer: Self-pay | Admitting: Cardiology

## 2014-03-23 ENCOUNTER — Ambulatory Visit (HOSPITAL_COMMUNITY): Payer: Medicare Other | Attending: Orthopedic Surgery | Admitting: Physical Therapy

## 2014-03-23 DIAGNOSIS — M25652 Stiffness of left hip, not elsewhere classified: Secondary | ICD-10-CM

## 2014-03-23 DIAGNOSIS — R262 Difficulty in walking, not elsewhere classified: Secondary | ICD-10-CM | POA: Diagnosis not present

## 2014-03-23 DIAGNOSIS — M25552 Pain in left hip: Secondary | ICD-10-CM | POA: Insufficient documentation

## 2014-03-23 NOTE — Therapy (Signed)
[place], [place], [address] Phone: [phone]   Fax:  [phone]  Physical Therapy Evaluation  Patient Details  Name: Andrew Watson MRN: [id number] Date of Birth: 23-Aug-1940 Referring Provider:  Gearlean Alf, MD  Encounter Date: 03/23/2014      PT End of Session - 03/23/14 1645    Visit Number 1   Number of Visits 12   Date for PT Re-Evaluation 05/22/14   PT Start Time 1301   PT Stop Time 1350   PT Time Calculation (min) 49 min      Past Medical History  Diagnosis Date  . Arthritis   . Gout     Past Surgical History  Procedure Laterality Date  . Knee arthroscopy    . Back surgery    . Colonoscopy  07/08/2011    Procedure: COLONOSCOPY;  Surgeon: Rogene Houston, MD;  [facility];  Service: Endoscopy;  Laterality: N/A;  930  . Left heart catheterization with coronary angiogram N/A 05/07/2011    Procedure: LEFT HEART CATHETERIZATION WITH CORONARY ANGIOGRAM;  Surgeon: Peter M Martinique, MD;  Location: [facility] CATH LAB;  Service: Cardiovascular;  Laterality: N/A;    There were no vitals taken for this visit.  Visit Diagnosis:  No diagnosis found.      Subjective Assessment - 03/23/14 1304    Symptoms Andrew Watson states that his LT hip has been bothering him for about 6 months.  Some days his pain is so bad he can hardly walk.  He thought it was his back and went to his back MD who determined that it was his hip.  He has been referred to PT to try and improve his functioning level.    How long can you sit comfortably? no problem   How long can you stand comfortably? no problem   How long can you walk comfortably? instant increased pain    Patient Stated Goals to walk better   Currently in Pain? Yes   Pain Score 0-No pain  highest has been a 10/10    Pain Location Hip   Pain Orientation Left   Pain Descriptors / Indicators Throbbing   Pain Type Chronic pain   Pain Radiating Towards  buttock area   Pain Onset More than a month ago   Pain Frequency Intermittent   Aggravating Factors  walking    Pain Relieving Factors sitting    Effect of Pain on Daily Activities limits walking           [facility] PT Assessment - 03/23/14 1310    Assessment   Medical Diagnosis Lt hip pain    Onset Date 09/20/13   Prior Therapy none   Precautions   Precautions None   Restrictions   Weight Bearing Restrictions Yes   Balance Screen   Has the patient fallen in the past 6 months No   Has the patient had a decrease in activity level because of a fear of falling?  No   Is the patient reluctant to leave their home because of a fear of falling?  No   Prior Function   Vocation Retired   Leisure yard work, Higher education careers adviser; Psychologist, prison and probation services   Overall Cognitive Status Within [employer] for tasks assessed   Posture/Postural Control   Posture/Postural Control Postural limitations   Postural Limitations Rounded Shoulders;Forward head;Decreased lumbar lordosis;Anterior pelvic tilt;Flexed trunk   AROM   Left Hip Extension -5  Left Hip Flexion 90   Left Hip External Rotation  22   Left Hip Internal Rotation  0   Strength   Left Hip Flexion 5/5   Left Hip Extension 5/5   Left Hip ABduction 5/5   Left Knee Flexion 5/5   Left Knee Extension 5/5   Left Ankle Dorsiflexion 5/5                  [facility] Adult PT Treatment/Exercise - 04-08-2014 1310    Exercises   Exercises Knee/Hip   Knee/Hip Exercises: Stretches   Active Hamstring Stretch 3 reps;30 seconds   Active Hamstring Stretch Limitations supine   Piriformis Stretch 1 rep;60 seconds   Piriformis Stretch Limitations sitting sliding Lt leg up RT   Knee/Hip Exercises: Standing   Other Standing Knee Exercises 3 D hip excursions   Knee/Hip Exercises: Supine   Other Supine Knee Exercises Passive knee to chest then contract relax to increase flexion   Other Supine Knee Exercises PROM for IR/ER then contract relax to increase  motion    Knee/Hip Exercises: Prone   Other Prone Exercises POE                PT Education - 2014-04-08 1653    Education provided Yes   Education Details HEP for stretching   Person(s) Educated Patient   Methods Explanation;Handout   Comprehension Returned demonstration                    Plan - 08-Apr-2014 1646    Clinical Impression Statement Andrew Watson is a 74 yo male who has been referred to skilled physical therapy for Lt hip pain.  Examination demonstrates significant decrased ROM in hip and back.  Pt will benefit form skilled therapy for PROM, AAROM and AROM to maximie pt mobility.   Pt will benefit from skilled therapeutic intervention in order to improve on the following deficits Abnormal gait;Decreased range of motion;Difficulty walking;Impaired flexibility;Pain;Increased fascial restricitons   Rehab Potential Good   PT Frequency 3x / week   PT Duration 4 weeks   PT Treatment/Interventions Manual techniques;Therapeutic exercise;Therapeutic activities   PT Next Visit Plan Passive; AA and AROM for hip and lumbar spine to improve overall functioning    PT Home Exercise Plan given          G-Codes - 04-08-14 1654    Functional Limitation Mobility: Walking and moving around   Mobility: Walking and Moving Around Current Status [phone]) At least 40 percent but less than 60 percent impaired, limited or restricted   Mobility: Walking and Moving Around Goal Status [phone]) At least 1 percent but less than 20 percent impaired, limited or restricted       Problem List Patient Active Problem List   Diagnosis Date Noted  . Insomnia 05/15/2011  . Pericarditis 05/09/2011  . Paroxysmal atrial fibrillation 05/08/2011  . Hypothyroidism 05/08/2011    Manish Ruggiero,CINDY PT 04-08-14, 4:55 PM  [place], [place], [address] Phone: [phone]   Fax:  [phone]

## 2014-03-23 NOTE — Patient Instructions (Addendum)
Stretching: Hamstring (Supine)   Supporting right thigh behind knee, slowly straighten knee until stretch is felt in back of thigh. Hold 30____ seconds. Repeat __3__ times per set. Do _1___ sets per session. Do __2__ sessions per day.  http://orth.exer.us/656   Copyright  VHI. All rights reserved.  Stretching: Piriformis   Cross left leg over other thigh and place elbow over outside of knee. Gently stretch buttock muscles by pushing bent knee across body. Hold _30___ seconds. Repeat _3___ times per set. Do ___1_ sets per session. Do _2___ sessions per day.  http://orth.exer.us/650   Copyright  VHI. All rights reserved.  On Elbows (Prone)   Rise up on elbows as high as possible, keeping hips on floor. Hold _300___ seconds. Repeat _1__ times per set. Do __1__ sets per session. Do __2__ sessions per day.  http://orth.exer.us/92   Copyright  VHI. All rights reserved.  Piriformis (Supine)   Cross legs, right on top. Gently pull other knee toward chest until stretch is felt in buttock/hip of top leg. Hold ____ seconds. Repeat ____ times per set. Do ____ sets per session. Do ____ sessions per day.  http://orth.exer.us/676   Copyright  VHI. All rights reserved.

## 2014-03-28 ENCOUNTER — Ambulatory Visit (HOSPITAL_COMMUNITY): Payer: Medicare Other | Attending: Orthopedic Surgery | Admitting: Physical Therapy

## 2014-03-28 DIAGNOSIS — M25652 Stiffness of left hip, not elsewhere classified: Secondary | ICD-10-CM

## 2014-03-28 DIAGNOSIS — R262 Difficulty in walking, not elsewhere classified: Secondary | ICD-10-CM | POA: Insufficient documentation

## 2014-03-28 DIAGNOSIS — M25552 Pain in left hip: Secondary | ICD-10-CM | POA: Insufficient documentation

## 2014-03-28 NOTE — Therapy (Signed)
[place], [place], [address] Phone: [phone]   Fax:  [phone]  Physical Therapy Treatment  Patient Details  Name: Andrew Watson MRN: [id number] Date of Birth: April 07, 1940 Referring Provider:  Delphina Cahill, MD  Encounter Date: 03/28/2014      PT End of Session - 03/28/14 1734    Visit Number 2   Number of Visits 12   Date for PT Re-Evaluation 05/22/14   PT Start Time 1645   PT Stop Time 1730   PT Time Calculation (min) 45 min   Activity Tolerance Patient tolerated treatment well   Behavior During Therapy [facility] for tasks assessed/performed      Past Medical History  Diagnosis Date  . Arthritis   . Gout     Past Surgical History  Procedure Laterality Date  . Knee arthroscopy    . Back surgery    . Colonoscopy  07/08/2011    Procedure: COLONOSCOPY;  Surgeon: Rogene Houston, MD;  [facility];  Service: Endoscopy;  Laterality: N/A;  930  . Left heart catheterization with coronary angiogram N/A 05/07/2011    Procedure: LEFT HEART CATHETERIZATION WITH CORONARY ANGIOGRAM;  Surgeon: Peter M Martinique, MD;  Location: [facility] CATH LAB;  Service: Cardiovascular;  Laterality: N/A;    There were no vitals taken for this visit.  Visit Diagnosis:  Stiffness of hip joint, left  Hip pain, acute, left  Difficulty walking      Subjective Assessment - 03/28/14 1738    Symptoms Pt states he was sore after last visit but already is feeling much better overall.  States it is difficult to complete his HEP due to him not having a good location to complete them.   Currently in Pain? No/denies          [facility] PT Assessment - 03/28/14 1730    AROM   Overall AROM Comments knee flexion Rt: 100 degrees, Lt 105 degrees   Left Hip Internal Rotation  25  pt has tone            [facility] Adult PT Treatment/Exercise - 03/28/14 1730    Knee/Hip Exercises: Stretches   Active Hamstring Stretch 3 reps;30 seconds   Active Hamstring Stretch Limitations standing with 14" box, long sitting and supine   Quad Stretch Limitations   Quad Stretch Limitations PROM bilaterally in prone   Hip Flexor Stretch 3 reps;30 seconds;Limitations   Hip Flexor Stretch Limitations on 14" step and groin stretch   Piriformis Stretch 1 rep;60 seconds   Piriformis Stretch Limitations supine AROM, PROM   Knee/Hip Exercises: Standing   Other Standing Knee Exercises 3 D hip excursions   Knee/Hip Exercises: Prone   Other Prone Exercises POE   Other Prone Exercises hip IR/ER PROM             Plan - 03/28/14 1735    Clinical Impression Statement Reviewed HEP with patient having difficulty completing piriformis stretch and POE.  Instructed patient to begin with 1 minute with POE and work self up.  Instructed with supine piriformis stretch using opposite LE to prop up.  Pt with dificiulty relaxing muscles for effective stretch taking increased time to achieve relaxation.  Hip IR stretched to 25 degrees today.  Pt with noted tone fighting the stretch.  Also with tight quadricep muscles.  added PROM for this as well.     PT Next Visit Plan Passive; AA and AROM for hip and lumbar spine  to improve overall functioning         Problem List Patient Active Problem List   Diagnosis Date Noted  . Insomnia 05/15/2011  . Pericarditis 05/09/2011  . Paroxysmal atrial fibrillation 05/08/2011  . Hypothyroidism 05/08/2011    Teena Irani, PTA/CLT [phone] 03/28/2014, 5:39 PM  [place] [address] [place], [place], [address] Phone: [phone]   Fax:  [phone]

## 2014-03-29 ENCOUNTER — Ambulatory Visit (HOSPITAL_COMMUNITY): Payer: Medicare Other

## 2014-03-29 DIAGNOSIS — M25652 Stiffness of left hip, not elsewhere classified: Secondary | ICD-10-CM

## 2014-03-29 DIAGNOSIS — M25552 Pain in left hip: Secondary | ICD-10-CM | POA: Diagnosis not present

## 2014-03-29 DIAGNOSIS — R262 Difficulty in walking, not elsewhere classified: Secondary | ICD-10-CM

## 2014-03-29 NOTE — Therapy (Signed)
[place], [place], [address] Phone: [phone]   Fax:  [phone]  Physical Therapy Treatment  Patient Details  Name: Andrew Watson MRN: [id number] Date of Birth: Dec 18, 1940 Referring Provider:  Delphina Cahill, MD  Encounter Date: 03/29/2014      PT End of Session - 03/29/14 4627    Visit Number 3   Number of Visits 12   Date for PT Re-Evaluation 05/22/14   Authorization Type BCBS   PT Start Time 0350   PT Stop Time 1825   PT Time Calculation (min) 47 min   Activity Tolerance Patient tolerated treatment well   Behavior During Therapy [facility] for tasks assessed/performed      Past Medical History  Diagnosis Date  . Arthritis   . Gout     Past Surgical History  Procedure Laterality Date  . Knee arthroscopy    . Back surgery    . Colonoscopy  07/08/2011    Procedure: COLONOSCOPY;  Surgeon: Rogene Houston, MD;  [facility];  Service: Endoscopy;  Laterality: N/A;  930  . Left heart catheterization with coronary angiogram N/A 05/07/2011    Procedure: LEFT HEART CATHETERIZATION WITH CORONARY ANGIOGRAM;  Surgeon: Peter M Martinique, MD;  Location: [facility] CATH LAB;  Service: Cardiovascular;  Laterality: N/A;    There were no vitals taken for this visit.  Visit Diagnosis:  Stiffness of hip joint, left  Hip pain, acute, left  Difficulty walking      Subjective Assessment - 03/29/14 1747    Symptoms Pt stated he is pain free today, has been compliant with HEP and happy knee is feeling better.   Currently in Pain? No/denies          [facility] PT Assessment - 03/28/14 1730    AROM   Overall AROM Comments knee flexion Rt: 100 degrees, Lt 105 degrees   Left Hip Internal Rotation  25  pt has tone                  [facility] Adult PT Treatment/Exercise - 03/29/14 0001    Exercises   Exercises Knee/Hip   Knee/Hip Exercises: Stretches   Active Hamstring Stretch 3 reps;30 seconds   Active Hamstring  Stretch Limitations 14in step 3 directions   Quad Stretch 3 reps;30 seconds;Other (comment)   Quad Stretch Limitations prone with rope, PROM   Hip Flexor Stretch 3 reps;30 seconds;Limitations   Hip Flexor Stretch Limitations on 14" step and groin stretch   Piriformis Stretch 3 reps;60 seconds   Piriformis Stretch Limitations supine manual stretch   Knee/Hip Exercises: Standing   Other Standing Knee Exercises 3 D hip excursions 2x 10   Knee/Hip Exercises: Prone   Other Prone Exercises POE 2 minutes; pressups 5x 5"   Other Prone Exercises hip IR/ER PROM                            Plan - 03/29/14 1811    Clinical Impression Statement This session continued focus on hip mobilty with stretches, manual PROM and excursion exercises with therapist facilitation to improve movements.  Pt with difficulty relaxing with manual stretches today, increased time with manual stretches to achieve relaxation. Added pressups to improve lumbar extension.  Improved hip mobility noted at end of session with no reports of pain through session.       PT Next Visit Plan Passive; AA and AROM for hip  and lumbar spine to improve overall functioning         Problem List Patient Active Problem List   Diagnosis Date Noted  . Insomnia 05/15/2011  . Pericarditis 05/09/2011  . Paroxysmal atrial fibrillation 05/08/2011  . Hypothyroidism 05/08/2011   Ihor Austin, [place] Aldona Lento 03/29/2014, 6:32 PM  [place] [address] [place], [place], [address] Phone: [phone]   Fax:  [phone]

## 2014-04-02 ENCOUNTER — Ambulatory Visit (HOSPITAL_COMMUNITY): Payer: Medicare Other | Admitting: Physical Therapy

## 2014-04-02 DIAGNOSIS — M25552 Pain in left hip: Secondary | ICD-10-CM | POA: Diagnosis not present

## 2014-04-02 DIAGNOSIS — R262 Difficulty in walking, not elsewhere classified: Secondary | ICD-10-CM

## 2014-04-02 DIAGNOSIS — M25652 Stiffness of left hip, not elsewhere classified: Secondary | ICD-10-CM

## 2014-04-02 NOTE — Therapy (Signed)
[place], [place], [address] Phone: [phone]   Fax:  [phone]  Physical Therapy Treatment  Patient Details  Name: Andrew Watson MRN: [id number] Date of Birth: [birth year] Referring Provider:  Gaynelle Arabian, MD  Encounter Date: 04/02/2014      PT End of Session - 04/02/14 1111    Visit Number 4   Number of Visits 12   Date for PT Re-Evaluation 05/22/14   Authorization Type BCBS   PT Start Time 1020   PT Stop Time 1100   PT Time Calculation (min) 40 min   Activity Tolerance Patient tolerated treatment well   Behavior During Therapy [facility] for tasks assessed/performed      Past Medical History  Diagnosis Date  . Arthritis   . Gout     Past Surgical History  Procedure Laterality Date  . Knee arthroscopy    . Back surgery    . Colonoscopy  07/08/2011    Procedure: COLONOSCOPY;  Surgeon: Rogene Houston, MD;  [facility];  Service: Endoscopy;  Laterality: N/A;  930  . Left heart catheterization with coronary angiogram N/A 05/07/2011    Procedure: LEFT HEART CATHETERIZATION WITH CORONARY ANGIOGRAM;  Surgeon: Peter M Martinique, MD;  Location: [facility] CATH LAB;  Service: Cardiovascular;  Laterality: N/A;    There were no vitals taken for this visit.  Visit Diagnosis:  Stiffness of hip joint, left  Hip pain, acute, left  Difficulty walking      Subjective Assessment - 04/02/14 1111    Symptoms Pt states he is at least 50% better than when he first started.  States he always feels 100% better after therapy.  Reports no pain today.   Currently in Pain? No/denies                    [facility] Adult PT Treatment/Exercise - 04/02/14 1025    Knee/Hip Exercises: Stretches   Active Hamstring Stretch 3 reps;30 seconds   Active Hamstring Stretch Limitations 14in step 3 directions   Hip Flexor Stretch 3 reps;30 seconds;Limitations   Hip Flexor Stretch Limitations on 14" step and groin stretch    Piriformis Stretch 3 reps;60 seconds   Piriformis Stretch Limitations supine manual stretch   Gastroc Stretch 3 reps;30 seconds   Gastroc Stretch Limitations slant board   Knee/Hip Exercises: Standing   Other Standing Knee Exercises 3 D hip excursions 2x 10   Knee/Hip Exercises: Supine   Other Supine Knee Exercises Passive knee to chest then contract relax to increase flexion   Other Supine Knee Exercises PROM for IR/ER then contract relax to increase motion    Knee/Hip Exercises: Prone   Other Prone Exercises POE 3X1 minutes   Other Prone Exercises hip IR/ER PROM                            Plan - 04/02/14 1112    Clinical Impression Statement Continued to focus on improving flexibility of LE's and hip mobility via AROM/PROM.  continued need for multimodal cues to facilitate increased movement and relaxation to decrease guarding with PROM.   Noted improvment in lumbar extension with POE and overall improved hip and LE mobility.   PT Next Visit Plan Passive; AA and AROM for hip and lumbar spine to improve overall functioning         Problem List Patient Active Problem List   Diagnosis Date Noted  .  Insomnia 05/15/2011  . Pericarditis 05/09/2011  . Paroxysmal atrial fibrillation 05/08/2011  . Hypothyroidism 05/08/2011    Teena Irani, PTA/CLT [phone] 04/02/2014, 11:14 AM  [place], [place], [address] Phone: [phone]   Fax:  [phone]

## 2014-04-03 ENCOUNTER — Ambulatory Visit (HOSPITAL_COMMUNITY): Payer: Medicare Other | Admitting: Physical Therapy

## 2014-04-03 DIAGNOSIS — M25652 Stiffness of left hip, not elsewhere classified: Secondary | ICD-10-CM

## 2014-04-03 DIAGNOSIS — R262 Difficulty in walking, not elsewhere classified: Secondary | ICD-10-CM

## 2014-04-03 DIAGNOSIS — M25552 Pain in left hip: Secondary | ICD-10-CM | POA: Diagnosis not present

## 2014-04-03 NOTE — Therapy (Signed)
[place], [place], [address] Phone: [phone]   Fax:  [phone]  Physical Therapy Treatment  Patient Details  Name: Andrew Watson MRN: [id number] Date of Birth: June 27, 1940 Referring Provider:  Gaynelle Arabian, MD  Encounter Date: 04/03/2014      PT End of Session - 04/03/14 1449    Visit Number 5   Number of Visits 12   Date for PT Re-Evaluation 05/22/14   Authorization Type BCBS   PT Start Time 1350   PT Stop Time 1435   PT Time Calculation (min) 45 min   Activity Tolerance Patient tolerated treatment well   Behavior During Therapy [facility] for tasks assessed/performed      Past Medical History  Diagnosis Date  . Arthritis   . Gout     Past Surgical History  Procedure Laterality Date  . Knee arthroscopy    . Back surgery    . Colonoscopy  07/08/2011    Procedure: COLONOSCOPY;  Surgeon: Rogene Houston, MD;  [facility];  Service: Endoscopy;  Laterality: N/A;  930  . Left heart catheterization with coronary angiogram N/A 05/07/2011    Procedure: LEFT HEART CATHETERIZATION WITH CORONARY ANGIOGRAM;  Surgeon: Peter M Martinique, MD;  Location: [facility] CATH LAB;  Service: Cardiovascular;  Laterality: N/A;    There were no vitals taken for this visit.  Visit Diagnosis:  Stiffness of hip joint, left  Hip pain, acute, left  Difficulty walking      Subjective Assessment - 04/03/14 1500    Symptoms Pt states he is leaving for the beach this afternoon.  States he was a little sore after his visit yesterday.   Currently in Pain? No/denies             [facility] Adult PT Treatment/Exercise - 04/03/14 0001    Knee/Hip Exercises: Stretches   Active Hamstring Stretch 3 reps;30 seconds   Active Hamstring Stretch Limitations 14in step 3 directions   Hip Flexor Stretch 3 reps;30 seconds;Limitations   Hip Flexor Stretch Limitations on 14" step and groin stretch   Piriformis Stretch 3 reps;60 seconds   Piriformis Stretch Limitations supine manual stretch   Gastroc Stretch 3 reps;30 seconds   Gastroc Stretch Limitations slant board   Knee/Hip Exercises: Standing   Other Standing Knee Exercises 3 D hip excursions 2x 10                Plan - 04/03/14 1454    Clinical Impression Statement Noted improvment in ROM today with manual techniques for hips and LE's.  Pt able to relax muscles with less guarding with passive movements.  Pt also with improved posture and overall movement.   PT Next Visit Plan Passive; AA and AROM for hip and lumbar spine to improve overall functioning         Problem List Patient Active Problem List   Diagnosis Date Noted  . Insomnia 05/15/2011  . Pericarditis 05/09/2011  . Paroxysmal atrial fibrillation 05/08/2011  . Hypothyroidism 05/08/2011    Teena Irani, PTA/CLT [phone] 04/03/2014, 3:04 PM  [place], [place], [address] Phone: [phone]   Fax:  [phone]

## 2014-04-04 ENCOUNTER — Encounter (HOSPITAL_COMMUNITY): Payer: Federal, State, Local not specified - PPO

## 2014-04-05 ENCOUNTER — Encounter (HOSPITAL_COMMUNITY): Payer: Federal, State, Local not specified - PPO

## 2014-04-11 ENCOUNTER — Ambulatory Visit (HOSPITAL_COMMUNITY): Payer: Medicare Other | Admitting: Physical Therapy

## 2014-04-11 DIAGNOSIS — R262 Difficulty in walking, not elsewhere classified: Secondary | ICD-10-CM

## 2014-04-11 DIAGNOSIS — M25652 Stiffness of left hip, not elsewhere classified: Secondary | ICD-10-CM

## 2014-04-11 DIAGNOSIS — M25552 Pain in left hip: Secondary | ICD-10-CM

## 2014-04-11 NOTE — Therapy (Signed)
[place], [place], [address] Phone: [phone]   Fax:  [phone]  Physical Therapy Treatment  Patient Details  Name: Andrew Watson MRN: [id number] Date of Birth: 1940-08-08 Referring Provider:  Gaynelle Arabian, MD  Encounter Date: 04/11/2014      PT End of Session - 04/11/14 0929    Visit Number 6   Number of Visits 12   Date for PT Re-Evaluation 05/22/14   Authorization Type BCBS   PT Start Time 0845   PT Stop Time 0927   PT Time Calculation (min) 42 min   Activity Tolerance Patient tolerated treatment well   Behavior During Therapy [facility] for tasks assessed/performed      Past Medical History  Diagnosis Date  . Arthritis   . Gout     Past Surgical History  Procedure Laterality Date  . Knee arthroscopy    . Back surgery    . Colonoscopy  07/08/2011    Procedure: COLONOSCOPY;  Surgeon: Rogene Houston, MD;  [facility];  Service: Endoscopy;  Laterality: N/A;  930  . Left heart catheterization with coronary angiogram N/A 05/07/2011    Procedure: LEFT HEART CATHETERIZATION WITH CORONARY ANGIOGRAM;  Surgeon: Peter M Martinique, MD;  Location: [facility] CATH LAB;  Service: Cardiovascular;  Laterality: N/A;    There were no vitals filed for this visit.  Visit Diagnosis:  Stiffness of hip joint, left  Hip pain, acute, left  Difficulty walking      Subjective Assessment - 04/11/14 0851    Symptoms pPatient states having a great vacation but otes increaseed pain following prolonged sittign or mowing loawn. Notes that when his pain returns it is relieved an d improved follwoing stretches for HEP.    Currently in Pain? No/denies            [facility] Adult PT Treatment/Exercise - 04/11/14 0001    Knee/Hip Exercises: Stretches   Active Hamstring Stretch 3 reps;20 seconds   Active Hamstring Stretch Limitations 14in step 3 directions   Hip Flexor Stretch 3 reps;Limitations;20 seconds   Hip Flexor  Stretch Limitations on 14" step and groin stretch   Piriformis Stretch 3 reps;60 seconds   Piriformis Stretch Limitations supine manual stretch   Gastroc Stretch 3 reps;30 seconds   Gastroc Stretch Limitations slant board   Knee/Hip Exercises: Standing   Other Standing Knee Exercises 3 D hip excursions 2x 10   Other Standing Knee Exercises 2D walking hip excursions 70ft each   Manual Therapy   Manual Therapy Joint mobilization   Joint Mobilization Rt hip grade 3-4 fopr flexion, extension, and internal/external rotation                PT Education - 04/11/14 0929    Education provided Yes   Education Details 3D hip excursions and walking hip excursions   Person(s) Educated Patient   Methods Explanation;Handout;Demonstration   Comprehension Verbalized understanding;Returned demonstration          PT Short Term Goals - 04/11/14 0934    PT SHORT TERM GOAL #1   Title Patient will be able to cross ankel over knee to be able to put on shoes and socks while sitting   Time 4   Period Weeks   Status New   PT SHORT TERM GOAL #2   Title Patient will be independent with HEP.    Time 4   Period Weeks   Status New  PT Long Term Goals - 04/11/14 0935    PT LONG TERM GOAL #1   Title Patient will be able to walk withtou limping or favoring one leg over the other 8   Time 8   Period Weeks   PT LONG TERM GOAL #2   Title Patient will state no pain with walking   Time 8   Period Weeks   Status New               Plan - 04/11/14 0929    Clinical Impression Statement Continued ROM and hip joint mobilization exercises. Progressed to walking hip excursions with patient noting decreased pain and displaying improved gait at end of session.    PT Next Visit Plan Continue hip mobility exercises, Introduce squat walk around to 4" box.         Problem List Patient Active Problem List   Diagnosis Date Noted  . Insomnia 05/15/2011  . Pericarditis 05/09/2011  .  Paroxysmal atrial fibrillation 05/08/2011  . Hypothyroidism 05/08/2011    Devona Konig R 04/11/2014, 9:36 AM  [place] [address] [place], [place], [address] Phone: [phone]   Fax:  [phone]

## 2014-04-12 ENCOUNTER — Ambulatory Visit (HOSPITAL_COMMUNITY): Payer: Medicare Other | Admitting: Physical Therapy

## 2014-04-16 ENCOUNTER — Encounter (HOSPITAL_COMMUNITY): Payer: Federal, State, Local not specified - PPO | Admitting: Physical Therapy

## 2014-04-18 ENCOUNTER — Encounter (HOSPITAL_COMMUNITY): Payer: Federal, State, Local not specified - PPO

## 2014-04-19 ENCOUNTER — Encounter (HOSPITAL_COMMUNITY): Payer: Federal, State, Local not specified - PPO | Admitting: Physical Therapy

## 2014-04-23 ENCOUNTER — Ambulatory Visit (HOSPITAL_COMMUNITY): Payer: Medicare Other

## 2014-04-23 DIAGNOSIS — M25652 Stiffness of left hip, not elsewhere classified: Secondary | ICD-10-CM

## 2014-04-23 DIAGNOSIS — M25552 Pain in left hip: Secondary | ICD-10-CM

## 2014-04-23 DIAGNOSIS — R262 Difficulty in walking, not elsewhere classified: Secondary | ICD-10-CM

## 2014-04-23 NOTE — Therapy (Signed)
[place], [place], [address] Phone: [phone]   Fax:  [phone]  Physical Therapy Treatment  Patient Details  Name: SAVANNAH ERBE MRN: [id number] Date of Birth: Dec 20, 1940 Referring Provider:  Gaynelle Arabian, MD  Encounter Date: 04/23/2014      PT End of Session - 04/23/14 1036    Visit Number 7   Number of Visits 12   Date for PT Re-Evaluation 05/22/14   Authorization Type BCBS   PT Start Time 0801   PT Stop Time 0848   PT Time Calculation (min) 47 min   Activity Tolerance Patient tolerated treatment well   Behavior During Therapy [facility] for tasks assessed/performed      Past Medical History  Diagnosis Date  . Arthritis   . Gout     Past Surgical History  Procedure Laterality Date  . Knee arthroscopy    . Back surgery    . Colonoscopy  07/08/2011    Procedure: COLONOSCOPY;  Surgeon: Rogene Houston, MD;  [facility];  Service: Endoscopy;  Laterality: N/A;  930  . Left heart catheterization with coronary angiogram N/A 05/07/2011    Procedure: LEFT HEART CATHETERIZATION WITH CORONARY ANGIOGRAM;  Surgeon: Peter M Martinique, MD;  Location: [facility] CATH LAB;  Service: Cardiovascular;  Laterality: N/A;    There were no vitals filed for this visit.  Visit Diagnosis:  Stiffness of hip joint, left  Hip pain, acute, left  Difficulty walking      Subjective Assessment - 04/23/14 0813    Symptoms Pt reported he completed HEP exercises at the beach that assisted with pain relief at the beach.  Current pain scale 2/10 Lt hip   Currently in Pain? Yes   Pain Score 2    Pain Location Groin   Pain Orientation Left;Anterior   Pain Descriptors / Indicators Aching;Sore            [facility] PT Assessment - 04/23/14 0001    Assessment   Medical Diagnosis Lt hip pain    Onset Date 09/20/13   Next MD Visit [place] unscheduled   Prior Therapy none   Precautions   Precautions None                    [facility] Adult PT Treatment/Exercise - 04/23/14 0001    Exercises   Exercises Knee/Hip   Knee/Hip Exercises: Stretches   Active Hamstring Stretch 3 reps;20 seconds   Active Hamstring Stretch Limitations 14in step 3 directions   Piriformis Stretch 3 reps;30 seconds   Piriformis Stretch Limitations supine manual stretch   Gastroc Stretch 3 reps;30 seconds   Gastroc Stretch Limitations slant board   Knee/Hip Exercises: Standing   Functional Squat 2 sets;5 reps   Functional Squat Limitations 2 squat then walk around 4 in step   Other Standing Knee Exercises 3 D hip excursions 2x 10   Other Standing Knee Exercises 2D walking hip excursions 11f each   Manual Therapy   Manual Therapy Joint mobilization   Joint Mobilization MET for Lt SI for outflare f/b gait training                  PT Short Term Goals - 04/23/14 1045    PT SHORT TERM GOAL #1   Title Patient will be able to cross ankel over knee to be able to put on shoes and socks while sitting   Status On-going   PT SHORT TERM GOAL #  2   Title Patient will be independent with HEP.    Status On-going           PT Long Term Goals - 04/23/14 1045    PT LONG TERM GOAL #1   Title Patient will be able to walk withtou limping or favoring one leg over the other 8   PT LONG TERM GOAL #2   Title Patient will state no pain with walking               Plan - 04/23/14 1036    Clinical Impression Statement Muscle energy technique to improve SI alignment for Lt SI outflare with vast improvements in internal rotaton and pain resolved.  Continued with gastroc, hamstring and piriformis stertches to improve hip mobility and added squat walk around to improve IR stregthening.  Pt  stated pain scale 0/10 at end of session.   PT Next Visit Plan Check SI alignment next session, continue hip mobility exercises,        Problem List Patient Active Problem List   Diagnosis Date Noted  . Insomnia  05/15/2011  . Pericarditis 05/09/2011  . Paroxysmal atrial fibrillation 05/08/2011  . Hypothyroidism 05/08/2011   Ihor Austin, [place]  Aldona Lento 04/23/2014, 10:56 AM  [place], [place], [address] Phone: [phone]   Fax:  [phone]

## 2014-04-25 ENCOUNTER — Ambulatory Visit (HOSPITAL_COMMUNITY): Payer: Medicare Other | Admitting: Physical Therapy

## 2014-04-25 DIAGNOSIS — M25552 Pain in left hip: Secondary | ICD-10-CM | POA: Diagnosis not present

## 2014-04-25 DIAGNOSIS — R262 Difficulty in walking, not elsewhere classified: Secondary | ICD-10-CM

## 2014-04-25 DIAGNOSIS — M25652 Stiffness of left hip, not elsewhere classified: Secondary | ICD-10-CM

## 2014-04-25 NOTE — Therapy (Signed)
[place], [place], [address] Phone: [phone]   Fax:  [phone]  Physical Therapy Treatment  Patient Details  Name: Andrew Watson MRN: [id number] Date of Birth: December 28, 1940 Referring Provider:  Gaynelle Arabian, MD  Encounter Date: 04/25/2014      PT End of Session - 04/25/14 0813    Visit Number 8   Number of Visits 12   Date for PT Re-Evaluation 05/22/14   Authorization Type BCBS   Authorization - Visit Number 8   Authorization - Number of Visits 12   PT Start Time 0801   PT Stop Time 0845   PT Time Calculation (min) 44 min   Activity Tolerance Patient tolerated treatment well   Behavior During Therapy [facility] for tasks assessed/performed      Past Medical History  Diagnosis Date  . Arthritis   . Gout     Past Surgical History  Procedure Laterality Date  . Knee arthroscopy    . Back surgery    . Colonoscopy  07/08/2011    Procedure: COLONOSCOPY;  Surgeon: Rogene Houston, MD;  [facility];  Service: Endoscopy;  Laterality: N/A;  930  . Left heart catheterization with coronary angiogram N/A 05/07/2011    Procedure: LEFT HEART CATHETERIZATION WITH CORONARY ANGIOGRAM;  Surgeon: Peter M Martinique, MD;  [facility] CATH LAB;  Service: Cardiovascular;  Laterality: N/A;    There were no vitals filed for this visit.  Visit Diagnosis:  Stiffness of hip joint, left  Hip pain, acute, left  Difficulty walking      Subjective Assessment - 04/25/14 0804    Symptoms patientreports havign an enjoyable easter at the beach. nots he has been feeliong better thoguh contionued stiffness in Rt and Lt hip          [facility] Adult PT Treatment/Exercise - 04/25/14 0001    Knee/Hip Exercises: Stretches   Active Hamstring Stretch Limitations 10x 3 seconds 3 way to2nd step   Quad Stretch 3 reps;30 seconds;Other (comment)   Quad Stretch Limitations prone ely and prone thomas stretch each   Hip Flexor  Stretch Limitations 10x 3 seconds to second step  3 way    Knee: Self-Stretch Limitations Groin stretch 10x 3 seconds to second step    ITB Stretch Limitations 10x 3" to first step   Piriformis Stretch 3 reps;30 seconds   Piriformis Stretch Limitations supine manual stretch   Gastroc Stretch 30 seconds;2 reps   Gastroc Stretch Limitations slant board   Knee/Hip Exercises: Standing   Functional Squat 2 sets;5 reps   Functional Squat Limitations 2 squat then walk around 4 in step   Other Standing Knee Exercises 3 D hip excursions in split stance 2x 10   Other Standing Knee Exercises 2D walking hip excursions 32ft each   Manual Therapy   Manual Therapy Joint mobilization   Joint Mobilization Rt hip grade 3-4 fopr flexion, extension, and internal/external rotation            PT Short Term Goals - 04/23/14 1045    PT SHORT TERM GOAL #1   Title Patient will be able to cross ankel over knee to be able to put on shoes and socks while sitting   Status On-going   PT SHORT TERM GOAL #2   Title Patient will be independent with HEP.    Status On-going           PT Long Term Goals - 04/23/14 1045  PT LONG TERM GOAL #1   Title Patient will be able to walk withtou limping or favoring one leg over the other 8   PT LONG TERM GOAL #2   Title Patient will state no pain with walking               Plan - 04/25/14 0838    Clinical Impression Statement Sessiion focused on performance of stretches and joint mobility to decrease latral and medial hip pain/stiffness following which patient demosntrated improved gait. performed lunges and squats for strenghening to enhance maintenance of improvemnts hip ROM. patient notes feeling much relief following joint mobilizations.    PT Next Visit Plan continue to progress hip ROM and lunges and squats for hip strengthening.         Problem List Patient Active Problem List   Diagnosis Date Noted  . Insomnia 05/15/2011  . Pericarditis  05/09/2011  . Paroxysmal atrial fibrillation 05/08/2011  . Hypothyroidism 05/08/2011    Somaya Grassi R 04/25/2014, 9:02 AM  [place], [place], [address] Phone: [phone]   Fax:  [phone]

## 2014-04-26 ENCOUNTER — Ambulatory Visit (HOSPITAL_COMMUNITY): Payer: Medicare Other | Admitting: Physical Therapy

## 2014-04-26 DIAGNOSIS — R262 Difficulty in walking, not elsewhere classified: Secondary | ICD-10-CM

## 2014-04-26 DIAGNOSIS — M25652 Stiffness of left hip, not elsewhere classified: Secondary | ICD-10-CM

## 2014-04-26 DIAGNOSIS — M25552 Pain in left hip: Secondary | ICD-10-CM | POA: Diagnosis not present

## 2014-04-26 NOTE — Patient Instructions (Signed)
Piriformis Stretch (Supine)   Grasp sound limb ankle and knee. Pull equally toward opposite shoulder. Hold 20 seconds. Repeat 4 times. Do 2 sessions per day.  Copyright  VHI. All rights reserved.

## 2014-04-26 NOTE — Therapy (Addendum)
[place], [place], [address] Phone: [phone]   Fax:  [phone]  Physical Therapy Treatment  Patient Details  Name: Andrew Watson MRN: [id number] Date of Birth: 1940-11-25 Referring Provider:  Gaynelle Arabian, MD  Encounter Date: 04/26/2014      PT End of Session - 04/26/14 0911    Visit Number 9   Number of Visits 12   Date for PT Re-Evaluation 05/22/14   Authorization Type [employer]   Authorization - Visit Number 9   Authorization - Number of Visits 12   PT Start Time 0801   PT Stop Time 0845   PT Time Calculation (min) 44 min   Activity Tolerance Patient tolerated treatment well   Behavior During Therapy [facility] for tasks assessed/performed      Past Medical History  Diagnosis Date  . Arthritis   . Gout     Past Surgical History  Procedure Laterality Date  . Knee arthroscopy    . Back surgery    . Colonoscopy  07/08/2011    Procedure: COLONOSCOPY;  Surgeon: Rogene Houston, MD;  [facility];  Service: Endoscopy;  Laterality: N/A;  930  . Left heart catheterization with coronary angiogram N/A 05/07/2011    Procedure: LEFT HEART CATHETERIZATION WITH CORONARY ANGIOGRAM;  Surgeon: Peter M Martinique, MD;  Location: [facility] CATH LAB;  Service: Cardiovascular;  Laterality: N/A;    There were no vitals filed for this visit.  Visit Diagnosis:  Stiffness of hip joint, left  Hip pain, acute, left  Difficulty walking      Subjective Assessment - 04/26/14 0803    Symptoms Patient states being unsure of reaching all goals but feels much better, Notes he is walking withtou pain meaning he met his main goal.    Currently in Pain? No/denies            [facility] PT Assessment - 04/26/14 0001    Assessment   Medical Diagnosis Lt hip pain    Onset Date 09/20/13   Next MD Visit Al[place]io unscheduled   Prior Therapy none   AROM   Left Hip Extension 5   Left Hip Flexion 105   Left Hip External Rotation   25   Left Hip Internal Rotation  25   Strength   Left Hip Flexion 5/5   Left Hip Extension 5/5   Left Hip ABduction 5/5   Left Knee Flexion 5/5   Left Knee Extension 5/5   Left Ankle Dorsiflexion 5/5           [facility] Adult PT Treatment/Exercise - 04/26/14 0001    Knee/Hip Exercises: Stretches   Active Hamstring Stretch Limitations 5x 3 seconds 3 way to2nd step, 3D hamstring drives 26V   Quad Stretch 3 reps;30 seconds;Other (comment)   Quad Stretch Limitations prone ely and prone thomas stretch each   Hip Flexor Stretch Limitations 5x 3 seconds to second step  3 way, 3 D hip drives    Piriformis Stretch 3 reps;30 seconds   Piriformis Stretch Limitations supine manual stretch   Gastroc Stretch Limitations 5x 3 seconds 3 way    Knee/Hip Exercises: Standing   Functional Squat Limitations squat then walk around 4in step 10x   Other Standing Knee Exercises 3 D hip excursions in split stance 2x 10   Other Standing Knee Exercises 5 lunges each   Manual Therapy   Manual Therapy Joint mobilization   Joint Mobilization Rt hip grade 3-4 fopr  flexion, extension, and internal/external rotation           PT Education - 05-24-2014 0910    Education provided Yes   Education Details HEP given, with explanation of importance and, and prescription for repetitions, sets, and performance.    Person(s) Educated Patient   Methods Explanation;Demonstration;Handout   Comprehension Verbalized understanding;Returned demonstration          PT Short Term Goals - May 24, 2014 0847    PT SHORT TERM GOAL #1   Title Patient will be able to cross ankel over knee to be able to put on shoes and socks while sitting   Status Not Met   PT SHORT TERM GOAL #2   Title Patient will be independent with HEP.    Status Achieved           PT Long Term Goals - 05-24-14 0848    PT LONG TERM GOAL #1   Title Patient will be able to walk withtou limping or favoring one leg over the other 8   Status Achieved   PT  LONG TERM GOAL #2   Title Patient will state no pain with walking   Status Achieved               Plan - 24-May-2014 0915    Clinical Impression Statement Session focused on education of HEP as patient stated desire to discharge. Patient continues to have difficulty crossing ankle over knee to tie shoes, but no pain with walking which was patient's primary goal    Patient DC with HEP.       G-Codes - May 24, 2014 9675    Functional Assessment Tool Used Clinical judgement: Patient states he can do everything he wants but cross his ankle over his knee   Functional Limitation Mobility: Walking and moving around   Mobility: Walking and Moving Around Discharge Status [id number]) At least 1 percent but less than 20 percent impaired, limited or restricted      Problem List Patient Active Problem List   Diagnosis Date Noted  . Insomnia 05/15/2011  . Pericarditis 05/09/2011  . Paroxysmal atrial fibrillation 05/08/2011  . Hypothyroidism 05/08/2011   Devona Konig PT DPT [place] [address] [place], [place], [address] Phone: [phone]   Fax:  [phone]   PHYSICAL THERAPY DISCHARGE SUMMARY  Visits from Start of Care: 9  Current functional level related to goals / functional outcomes: All goals met but patient still unable to cross ankle over knee to easily tie shoes. Patient states he will address this on his own. He is very satisfied with therapy.   Plan: Patient agrees to discharge.  Patient goals were partially met. Patient is being discharged due to being pleased with the current functional level.  ?????       Devona Konig PT DPT [phone]

## 2015-01-27 DIAGNOSIS — Z86711 Personal history of pulmonary embolism: Secondary | ICD-10-CM

## 2015-01-27 DIAGNOSIS — Z8719 Personal history of other diseases of the digestive system: Secondary | ICD-10-CM

## 2015-01-27 HISTORY — DX: Personal history of pulmonary embolism: Z86.711

## 2015-01-27 HISTORY — DX: Personal history of other diseases of the digestive system: Z87.19

## 2015-05-04 ENCOUNTER — Encounter (HOSPITAL_COMMUNITY): Payer: Self-pay | Admitting: Emergency Medicine

## 2015-05-04 ENCOUNTER — Emergency Department (HOSPITAL_COMMUNITY): Payer: Medicare Other

## 2015-05-04 ENCOUNTER — Emergency Department (HOSPITAL_COMMUNITY)
Admission: EM | Admit: 2015-05-04 | Discharge: 2015-05-04 | Disposition: A | Discharge status: Home / Self Care | Payer: Medicare Other | Attending: Emergency Medicine | Admitting: Emergency Medicine

## 2015-05-04 DIAGNOSIS — M1990 Unspecified osteoarthritis, unspecified site: Secondary | ICD-10-CM | POA: Insufficient documentation

## 2015-05-04 DIAGNOSIS — E039 Hypothyroidism, unspecified: Secondary | ICD-10-CM | POA: Insufficient documentation

## 2015-05-04 DIAGNOSIS — J40 Bronchitis, not specified as acute or chronic: Secondary | ICD-10-CM | POA: Diagnosis not present

## 2015-05-04 DIAGNOSIS — I10 Essential (primary) hypertension: Secondary | ICD-10-CM | POA: Diagnosis not present

## 2015-05-04 DIAGNOSIS — Z79899 Other long term (current) drug therapy: Secondary | ICD-10-CM | POA: Diagnosis not present

## 2015-05-04 DIAGNOSIS — R05 Cough: Secondary | ICD-10-CM | POA: Diagnosis present

## 2015-05-04 MED ORDER — HYDROCOD POLST-CPM POLST ER 10-8 MG/5ML PO SUER: 5.0000 mL | Freq: Two times a day (BID) | ORAL | Status: DC | PRN

## 2015-05-04 MED ORDER — ALBUTEROL SULFATE HFA 108 (90 BASE) MCG/ACT IN AERS: 1.0000 | INHALATION_SPRAY | Freq: Four times a day (QID) | RESPIRATORY_TRACT | Status: DC | PRN

## 2015-05-04 MED ORDER — AZITHROMYCIN 250 MG PO TABS: ORAL_TABLET | ORAL | Status: DC

## 2015-05-04 NOTE — Discharge Instructions (Signed)
Increase fluids.  Tylenol for fever or chills. Prescriptions for antibiotic, cough syrup, inhaler. Follow-up your primary care doctor.

## 2015-05-04 NOTE — ED Notes (Signed)
Patient c/o cough with body aches and chills. Patient unsure of any fevers. Per patient cough occasionally productive. Patient using nightquil and advil with no relief.

## 2015-05-04 NOTE — ED Provider Notes (Signed)
CSN: [id number]     Arrival date & time 05/04/15  1827 History   First MD Initiated Contact with Patient 05/04/15 1842     Chief Complaint  Patient presents with  . Cough     (Consider location/radiation/quality/duration/timing/severity/associated sxs/prior Treatment) HPI...... cough for several days with body aches and chills. No sputum. Patient's been using over-the-counter products with minimal relief. Past medical history includes arthritis, gout, hypothyroidism, hypertension, GERD. Severity symptoms is moderate. Nothing makes symptoms better or worse.  Past Medical History  Diagnosis Date  . Arthritis   . Gout    Past Surgical History  Procedure Laterality Date  . Knee arthroscopy    . Back surgery    . Colonoscopy  07/08/2011    Procedure: COLONOSCOPY;  Surgeon: Rogene Houston, MD;  [facility];  Service: Endoscopy;  Laterality: N/A;  930  . Left heart catheterization with coronary angiogram N/A 05/07/2011    Procedure: LEFT HEART CATHETERIZATION WITH CORONARY ANGIOGRAM;  Surgeon: Peter M Martinique, MD;  Location: [facility] CATH LAB;  Service: Cardiovascular;  Laterality: N/A;   History reviewed. No pertinent family history. Social History  Substance Use Topics  . Smoking status: Never Smoker   . Smokeless tobacco: Never Used  . Alcohol Use: Yes     Comment: social    Review of Systems  All other systems reviewed and are negative.     Allergies  Review of patient's allergies indicates no known allergies.  Home Medications   Prior to Admission medications   Medication Sig Start Date End Date Taking? Authorizing Provider  albuterol (PROVENTIL HFA;VENTOLIN HFA) 108 (90 Base) MCG/ACT inhaler Inhale 1-2 puffs into the lungs every 6 (six) hours as needed for wheezing or shortness of breath. 05/04/15   Nat Christen, MD  aspirin EC 81 MG tablet Take 1 tablet (81 mg total) by mouth daily. 07/08/11   Rogene Houston, MD  azithromycin (ZITHROMAX Z-PAK) 250 MG tablet As  directed 05/04/15   Nat Christen, MD  chlorpheniramine-HYDROcodone [facility] ER) 10-8 MG/5ML SUER Take 5 mLs by mouth every 12 (twelve) hours as needed for cough. 05/04/15   Nat Christen, MD  levothyroxine (SYNTHROID) 50 MCG tablet Take 1 tablet (50 mcg total) by mouth every morning. 05/09/11 05/08/12  Roger A Arguello, PA-C  metoprolol succinate (TOPROL XL) 25 MG 24 hr tablet Take 1 tablet (25 mg total) by mouth daily. 05/09/11 05/08/12  Roger A Arguello, PA-C  pantoprazole (PROTONIX) 40 MG tablet Take 1 tablet (40 mg total) by mouth daily. 05/09/11 05/08/12  Roger A Arguello, PA-C  zolpidem (AMBIEN) 5 MG tablet Take 0.5 tablets (2.5 mg total) by mouth at bedtime as needed for sleep. 05/15/11 05/14/12  Lendon Colonel, NP   BP 136/89 mmHg  Pulse 88  Temp(Src) 98.3 F (36.8 C) (Oral)  Resp 18  Ht 5\' 11"  (1.803 m)  Wt 230 lb (104.327 kg)  BMI 32.09 kg/m2  SpO2 100% Physical Exam  Constitutional: He is oriented to person, place, and time. He appears well-developed and well-nourished.  Coughing, no acute distress  HENT:  Head: Normocephalic and atraumatic.  Eyes: Conjunctivae and EOM are normal. Pupils are equal, round, and reactive to light.  Neck: Normal range of motion. Neck supple.  Cardiovascular: Normal rate and regular rhythm.   Pulmonary/Chest: Effort normal and breath sounds normal.  Abdominal: Soft. Bowel sounds are normal.  Musculoskeletal: Normal range of motion.  Neurological: He is alert and oriented to person, place, and time.  Skin: Skin is warm and dry.  Psychiatric: He has a normal mood and affect. His behavior is normal.  Nursing note and vitals reviewed.   ED Course  Procedures (including critical care time) Labs Review Labs Reviewed - No data to display  Imaging Review Dg Chest 2 View  05/04/2015  CLINICAL DATA:  Cough and flu symptoms for several days. EXAM: CHEST  2 VIEW COMPARISON:  05/07/2011 FINDINGS: Cardiac silhouette is normal in size and  configuration. No mediastinal or hilar masses or evidence of adenopathy. Clear lungs.  No pleural effusion or pneumothorax. Bony thorax is intact. IMPRESSION: No acute cardiopulmonary disease. Electronically Signed   By: Lajean Manes M.D.   On: 05/04/2015 19:01   I have personally reviewed and evaluated these images and lab results as part of my medical decision-making.   EKG Interpretation None      MDM   Final diagnoses:  Bronchitis    Symptoms have persisted. He is elderly and immunocompromise. Rx Zithromax, Tussionex, albuterol inhaler.    Nat Christen, MD 05/04/15 1944

## 2015-05-08 ENCOUNTER — Encounter (HOSPITAL_COMMUNITY): Payer: Self-pay | Admitting: Emergency Medicine

## 2015-05-08 ENCOUNTER — Emergency Department (HOSPITAL_COMMUNITY)
Admission: EM | Admit: 2015-05-08 | Discharge: 2015-05-08 | Disposition: A | Discharge status: Home / Self Care | Payer: Medicare Other | Attending: Emergency Medicine | Admitting: Emergency Medicine

## 2015-05-08 ENCOUNTER — Emergency Department (HOSPITAL_COMMUNITY): Payer: Medicare Other

## 2015-05-08 DIAGNOSIS — Z79899 Other long term (current) drug therapy: Secondary | ICD-10-CM | POA: Diagnosis not present

## 2015-05-08 DIAGNOSIS — J40 Bronchitis, not specified as acute or chronic: Secondary | ICD-10-CM | POA: Diagnosis not present

## 2015-05-08 DIAGNOSIS — Z792 Long term (current) use of antibiotics: Secondary | ICD-10-CM | POA: Diagnosis not present

## 2015-05-08 DIAGNOSIS — M1990 Unspecified osteoarthritis, unspecified site: Secondary | ICD-10-CM | POA: Diagnosis not present

## 2015-05-08 DIAGNOSIS — R531 Weakness: Secondary | ICD-10-CM | POA: Diagnosis present

## 2015-05-08 LAB — URINALYSIS, ROUTINE W REFLEX MICROSCOPIC
Bilirubin Urine: NEGATIVE
Glucose, UA: NEGATIVE mg/dL
KETONES UR: NEGATIVE mg/dL
LEUKOCYTES UA: NEGATIVE
Nitrite: NEGATIVE
PROTEIN: NEGATIVE mg/dL
Specific Gravity, Urine: 1.02 (ref 1.005–1.030)
pH: 6.5 (ref 5.0–8.0)

## 2015-05-08 LAB — COMPREHENSIVE METABOLIC PANEL
ALK PHOS: 35 U/L — AB (ref 38–126)
ALT: 43 U/L (ref 17–63)
AST: 43 U/L — ABNORMAL HIGH (ref 15–41)
Albumin: 4.1 g/dL (ref 3.5–5.0)
Anion gap: 9 (ref 5–15)
BILIRUBIN TOTAL: 1.1 mg/dL (ref 0.3–1.2)
BUN: 17 mg/dL (ref 6–20)
CALCIUM: 8.5 mg/dL — AB (ref 8.9–10.3)
CHLORIDE: 101 mmol/L (ref 101–111)
CO2: 25 mmol/L (ref 22–32)
CREATININE: 0.92 mg/dL (ref 0.61–1.24)
Glucose, Bld: 111 mg/dL — ABNORMAL HIGH (ref 65–99)
Potassium: 4.2 mmol/L (ref 3.5–5.1)
Sodium: 135 mmol/L (ref 135–145)
TOTAL PROTEIN: 7.4 g/dL (ref 6.5–8.1)

## 2015-05-08 LAB — CBC
HCT: 40.5 % (ref 39.0–52.0)
Hemoglobin: 13.8 g/dL (ref 13.0–17.0)
MCH: 31.2 pg (ref 26.0–34.0)
MCHC: 34.1 g/dL (ref 30.0–36.0)
MCV: 91.4 fL (ref 78.0–100.0)
PLATELETS: 74 10*3/uL — AB (ref 150–400)
RBC: 4.43 MIL/uL (ref 4.22–5.81)
RDW: 12.6 % (ref 11.5–15.5)
WBC: 4.7 10*3/uL (ref 4.0–10.5)

## 2015-05-08 LAB — I-STAT CG4 LACTIC ACID, ED: Lactic Acid, Venous: 1.03 mmol/L (ref 0.5–2.0)

## 2015-05-08 LAB — CK: Total CK: 155 U/L (ref 49–397)

## 2015-05-08 LAB — INFLUENZA PANEL BY PCR (TYPE A & B)
H1N1 flu by pcr: NOT DETECTED
INFLAPCR: NEGATIVE
INFLBPCR: NEGATIVE

## 2015-05-08 LAB — URINE MICROSCOPIC-ADD ON

## 2015-05-08 LAB — LIPASE, BLOOD: LIPASE: 22 U/L (ref 11–51)

## 2015-05-08 MED ORDER — LEVOFLOXACIN 500 MG PO TABS: 500.0000 mg | ORAL_TABLET | Freq: Every day | ORAL | Status: DC

## 2015-05-08 MED ORDER — IPRATROPIUM-ALBUTEROL 0.5-2.5 (3) MG/3ML IN SOLN
3.0000 mL | Freq: Once | RESPIRATORY_TRACT | Status: AC
  Administered 2015-05-08: 3 mL via RESPIRATORY_TRACT
  Filled 2015-05-08: qty 3

## 2015-05-08 MED ORDER — TRAMADOL HCL 50 MG PO TABS: 50.0000 mg | ORAL_TABLET | Freq: Four times a day (QID) | ORAL | Status: DC | PRN

## 2015-05-08 MED ORDER — ALBUTEROL SULFATE (2.5 MG/3ML) 0.083% IN NEBU
2.5000 mg | INHALATION_SOLUTION | Freq: Once | RESPIRATORY_TRACT | Status: AC
  Administered 2015-05-08: 2.5 mg via RESPIRATORY_TRACT
  Filled 2015-05-08: qty 3

## 2015-05-08 MED ORDER — PREDNISONE 10 MG PO TABS
60.0000 mg | ORAL_TABLET | Freq: Once | ORAL | Status: AC
  Administered 2015-05-08: 60 mg via ORAL
  Filled 2015-05-08: qty 1

## 2015-05-08 MED ORDER — PREDNISONE 20 MG PO TABS: ORAL_TABLET | ORAL | Status: DC

## 2015-05-08 MED ORDER — SODIUM CHLORIDE 0.9 % IV BOLUS (SEPSIS)
1000.0000 mL | Freq: Once | INTRAVENOUS | Status: AC
  Administered 2015-05-08: 1000 mL via INTRAVENOUS

## 2015-05-08 NOTE — ED Provider Notes (Signed)
CSN: [id number]     Arrival date & time 05/08/15  1234 History   First MD Initiated Contact with Patient 05/08/15 1516     Chief Complaint  Patient presents with  . Emesis     (Consider location/radiation/quality/duration/timing/severity/associated sxs/prior Treatment) Patient is a 75 y.o. male presenting with weakness. The history is provided by the patient (Patient complains of weakness cough congestion wheezing this is been going on for over a week he was initially put on Zithromax).  Weakness This is a new problem. The current episode started more than 2 days ago. The problem occurs constantly. The problem has not changed since onset.Pertinent negatives include no chest pain, no abdominal pain and no headaches. Nothing aggravates the symptoms. Nothing relieves the symptoms.    Past Medical History  Diagnosis Date  . Arthritis   . Gout    Past Surgical History  Procedure Laterality Date  . Knee arthroscopy    . Back surgery    . Colonoscopy  07/08/2011    Procedure: COLONOSCOPY;  Surgeon: Rogene Houston, MD;  [facility];  Service: Endoscopy;  Laterality: N/A;  930  . Left heart catheterization with coronary angiogram N/A 05/07/2011    Procedure: LEFT HEART CATHETERIZATION WITH CORONARY ANGIOGRAM;  Surgeon: Peter M Martinique, MD;  Location: [facility] CATH LAB;  Service: Cardiovascular;  Laterality: N/A;   History reviewed. No pertinent family history. Social History  Substance Use Topics  . Smoking status: Never Smoker   . Smokeless tobacco: Never Used  . Alcohol Use: Yes     Comment: social    Review of Systems  Constitutional: Negative for appetite change and fatigue.  HENT: Negative for congestion, ear discharge and sinus pressure.   Eyes: Negative for discharge.  Respiratory: Positive for cough and wheezing.   Cardiovascular: Negative for chest pain.  Gastrointestinal: Negative for abdominal pain and diarrhea.  Genitourinary: Negative for frequency and hematuria.   Musculoskeletal: Negative for back pain.  Skin: Negative for rash.  Neurological: Positive for weakness. Negative for seizures and headaches.  Psychiatric/Behavioral: Negative for hallucinations.      Allergies  Review of patient's allergies indicates no known allergies.  Home Medications   Prior to Admission medications   Medication Sig Start Date End Date Taking? Authorizing Provider  albuterol (PROVENTIL HFA;VENTOLIN HFA) 108 (90 Base) MCG/ACT inhaler Inhale 1-2 puffs into the lungs every 6 (six) hours as needed for wheezing or shortness of breath. 05/04/15  Yes Nat Christen, MD  azithromycin (ZITHROMAX) 250 MG tablet TAKE 2 TABLETS BY MOUTH TODAY, THEN TAKE 1 TABLET DAILY FOR 4 DAYS starting on 05/05/15 05/04/15  Yes Historical Provider, MD  chlorpheniramine-HYDROcodone (TUSSIONEX) 10-8 MG/5ML SUER TAKE 5 ML BY MOUTH EVERY 12 HOURS AS NEEDED FOR COUGH 05/04/15  Yes Historical Provider, MD  levothyroxine (SYNTHROID, LEVOTHROID) 150 MCG tablet Take 150 mcg by mouth daily. 03/14/15  Yes Historical Provider, MD  loratadine (CLARITIN) 10 MG tablet Take 10 mg by mouth daily.   Yes Historical Provider, MD  levofloxacin (LEVAQUIN) 500 MG tablet Take 1 tablet (500 mg total) by mouth daily. 05/08/15   Milton Ferguson, MD  levothyroxine (SYNTHROID) 50 MCG tablet Take 1 tablet (50 mcg total) by mouth every morning. 05/09/11 05/08/12  Roger A Arguello, PA-C  metoprolol succinate (TOPROL XL) 25 MG 24 hr tablet Take 1 tablet (25 mg total) by mouth daily. 05/09/11 05/08/12  Roger A Arguello, PA-C  pantoprazole (PROTONIX) 40 MG tablet Take 1 tablet (40 mg total) by mouth  daily. 05/09/11 05/08/12  Roger A Arguello, PA-C  predniSONE (DELTASONE) 20 MG tablet 2 tabs po daily x 3 days 05/08/15   Milton Ferguson, MD  traMADol (ULTRAM) 50 MG tablet Take 1 tablet (50 mg total) by mouth every 6 (six) hours as needed. 05/08/15   Milton Ferguson, MD  zolpidem (AMBIEN) 5 MG tablet Take 0.5 tablets (2.5 mg total) by mouth at bedtime as  needed for sleep. 05/15/11 05/14/12  Lendon Colonel, NP   BP 127/78 mmHg  Pulse 95  Temp(Src) 98.9 F (37.2 C) (Oral)  Resp 15  Ht 5\' 11"  (1.803 m)  Wt 230 lb (104.327 kg)  BMI 32.09 kg/m2  SpO2 98% Physical Exam  Constitutional: He is oriented to person, place, and time. He appears well-developed.  HENT:  Head: Normocephalic.  Eyes: Conjunctivae and EOM are normal. No scleral icterus.  Neck: Neck supple. No thyromegaly present.  Cardiovascular: Normal rate and regular rhythm.  Exam reveals no gallop and no friction rub.   No murmur heard. Pulmonary/Chest: No stridor. He has wheezes. He has no rales. He exhibits no tenderness.  Abdominal: He exhibits no distension. There is no tenderness. There is no rebound.  Musculoskeletal: Normal range of motion. He exhibits no edema.  Lymphadenopathy:    He has no cervical adenopathy.  Neurological: He is oriented to person, place, and time. He exhibits normal muscle tone. Coordination normal.  Skin: No rash noted. No erythema.  Psychiatric: He has a normal mood and affect. His behavior is normal.    ED Course  Procedures (including critical care time) Labs Review Labs Reviewed  COMPREHENSIVE METABOLIC PANEL - Abnormal; Notable for the following:    Glucose, Bld 111 (*)    Calcium 8.5 (*)    AST 43 (*)    Alkaline Phosphatase 35 (*)    All other components within normal limits  CBC - Abnormal; Notable for the following:    Platelets 74 (*)    All other components within normal limits  URINALYSIS, ROUTINE W REFLEX MICROSCOPIC (NOT AT [facility]) - Abnormal; Notable for the following:    Hgb urine dipstick SMALL (*)    All other components within normal limits  URINE MICROSCOPIC-ADD ON - Abnormal; Notable for the following:    Squamous Epithelial / LPF 0-5 (*)    Bacteria, UA RARE (*)    All other components within normal limits  LIPASE, BLOOD  CK  INFLUENZA PANEL BY PCR (TYPE A & B, H1N1)  I-STAT CG4 LACTIC ACID, ED    Imaging  Review Dg Chest 2 View  05/08/2015  CLINICAL DATA:  Shortness of breath and congestion EXAM: CHEST  2 VIEW COMPARISON:  05/04/2015 FINDINGS: Central airway thickening. There is no edema, consolidation, effusion, or pneumothorax. Normal heart size and mediastinal contours. IMPRESSION: Bronchitic markings without pneumonia. Electronically Signed   By: Monte Fantasia M.D.   On: 05/08/2015 13:44   I have personally reviewed and evaluated these images and lab results as part of my medical decision-making.   EKG Interpretation   Date/Time:  Wednesday May 08 2015 12:45:00 EDT Ventricular Rate:  100 PR Interval:  164 QRS Duration: 76 QT Interval:  346 QTC Calculation: 446 R Axis:   48 Text Interpretation:  Normal sinus rhythm Normal ECG Confirmed by Nyilah Kight   MD, Mirl Hillery [id number]) on 05/08/2015 3:37:17 PM      MDM   Final diagnoses:  Bronchitis    Chest x-ray shows bronchitis. Patient with moderate wheezing. Will stop Zithromax.  Start Levaquin prednisone and Ultram for pain. Patient will continue the albuterol inhaler and follow-up with his PCP    Milton Ferguson, MD 05/08/15 1757

## 2015-05-08 NOTE — Discharge Instructions (Signed)
Stop taking the Zithromax. Drink plenty of fluids and follow-up with your family doctor next week

## 2015-05-08 NOTE — ED Notes (Signed)
Pt placed on 2L 02 [place] for 02 sats 91%.  Pt also given assistance standing to get urine sample.

## 2015-05-08 NOTE — ED Notes (Signed)
Pt's family member said pt is considering going home without being seen  Explained to pt that we were extremely busy but we had some discharges and we were moving pt's out as fast as possible.  Family member asked could pt lay down somewhere, explained that the hall way stretchers were full at this time.  She requesting a stretcher be brought down from somewhere else in the hospital.  Informed pt that I would notify charge RN.  Charge RN aware and she is working to discharge patients to make room for pt.

## 2015-05-08 NOTE — ED Notes (Signed)
Pt c/o fever, emesis and generalized weakness since last Thur.

## 2015-05-08 NOTE — ED Notes (Signed)
MD at bedside. 

## 2015-06-21 ENCOUNTER — Other Ambulatory Visit (HOSPITAL_COMMUNITY): Payer: Self-pay | Admitting: Internal Medicine

## 2015-06-21 DIAGNOSIS — N50819 Testicular pain, unspecified: Secondary | ICD-10-CM

## 2015-06-21 DIAGNOSIS — R319 Hematuria, unspecified: Secondary | ICD-10-CM

## 2015-06-25 ENCOUNTER — Ambulatory Visit (HOSPITAL_COMMUNITY)
Admission: RE | Admit: 2015-06-25 | Discharge: 2015-06-25 | Disposition: A | Discharge status: Home / Self Care | Payer: Medicare Other | Source: Ambulatory Visit | Attending: Internal Medicine | Admitting: Internal Medicine

## 2015-06-25 DIAGNOSIS — R319 Hematuria, unspecified: Secondary | ICD-10-CM | POA: Diagnosis not present

## 2015-06-25 DIAGNOSIS — N432 Other hydrocele: Secondary | ICD-10-CM | POA: Diagnosis not present

## 2015-06-25 DIAGNOSIS — N50819 Testicular pain, unspecified: Secondary | ICD-10-CM | POA: Insufficient documentation

## 2015-06-26 ENCOUNTER — Ambulatory Visit (HOSPITAL_COMMUNITY): Admission: RE | Admit: 2015-06-26 | Payer: Medicare Other | Source: Ambulatory Visit

## 2015-06-26 ENCOUNTER — Emergency Department (HOSPITAL_COMMUNITY): Payer: Medicare Other

## 2015-06-26 ENCOUNTER — Inpatient Hospital Stay (HOSPITAL_COMMUNITY)
Admission: EM | Admit: 2015-06-26 | Discharge: 2015-07-12 | DRG: 438 | Disposition: A | Discharge status: Home Health | Payer: Medicare Other | Attending: Internal Medicine | Admitting: Internal Medicine

## 2015-06-26 ENCOUNTER — Encounter (HOSPITAL_COMMUNITY): Payer: Self-pay

## 2015-06-26 DIAGNOSIS — IMO0002 Reserved for concepts with insufficient information to code with codable children: Secondary | ICD-10-CM

## 2015-06-26 DIAGNOSIS — Z789 Other specified health status: Secondary | ICD-10-CM | POA: Diagnosis not present

## 2015-06-26 DIAGNOSIS — R0600 Dyspnea, unspecified: Secondary | ICD-10-CM

## 2015-06-26 DIAGNOSIS — N5089 Other specified disorders of the male genital organs: Secondary | ICD-10-CM | POA: Diagnosis present

## 2015-06-26 DIAGNOSIS — K8500 Idiopathic acute pancreatitis without necrosis or infection: Secondary | ICD-10-CM

## 2015-06-26 DIAGNOSIS — E8770 Fluid overload, unspecified: Secondary | ICD-10-CM | POA: Diagnosis present

## 2015-06-26 DIAGNOSIS — E43 Unspecified severe protein-calorie malnutrition: Secondary | ICD-10-CM | POA: Diagnosis present

## 2015-06-26 DIAGNOSIS — Z8379 Family history of other diseases of the digestive system: Secondary | ICD-10-CM | POA: Diagnosis not present

## 2015-06-26 DIAGNOSIS — K852 Alcohol induced acute pancreatitis without necrosis or infection: Secondary | ICD-10-CM | POA: Diagnosis not present

## 2015-06-26 DIAGNOSIS — Z6832 Body mass index (BMI) 32.0-32.9, adult: Secondary | ICD-10-CM | POA: Diagnosis not present

## 2015-06-26 DIAGNOSIS — R188 Other ascites: Secondary | ICD-10-CM | POA: Diagnosis present

## 2015-06-26 DIAGNOSIS — D696 Thrombocytopenia, unspecified: Secondary | ICD-10-CM | POA: Diagnosis present

## 2015-06-26 DIAGNOSIS — R112 Nausea with vomiting, unspecified: Secondary | ICD-10-CM | POA: Diagnosis not present

## 2015-06-26 DIAGNOSIS — R109 Unspecified abdominal pain: Secondary | ICD-10-CM

## 2015-06-26 DIAGNOSIS — G8929 Other chronic pain: Secondary | ICD-10-CM | POA: Diagnosis present

## 2015-06-26 DIAGNOSIS — K859 Acute pancreatitis without necrosis or infection, unspecified: Secondary | ICD-10-CM | POA: Diagnosis present

## 2015-06-26 DIAGNOSIS — Z4659 Encounter for fitting and adjustment of other gastrointestinal appliance and device: Secondary | ICD-10-CM

## 2015-06-26 DIAGNOSIS — R1013 Epigastric pain: Secondary | ICD-10-CM | POA: Diagnosis not present

## 2015-06-26 DIAGNOSIS — J90 Pleural effusion, not elsewhere classified: Secondary | ICD-10-CM | POA: Diagnosis present

## 2015-06-26 DIAGNOSIS — M1612 Unilateral primary osteoarthritis, left hip: Secondary | ICD-10-CM | POA: Diagnosis present

## 2015-06-26 DIAGNOSIS — R0602 Shortness of breath: Secondary | ICD-10-CM

## 2015-06-26 DIAGNOSIS — E669 Obesity, unspecified: Secondary | ICD-10-CM | POA: Diagnosis present

## 2015-06-26 DIAGNOSIS — Z0189 Encounter for other specified special examinations: Secondary | ICD-10-CM

## 2015-06-26 DIAGNOSIS — K851 Biliary acute pancreatitis without necrosis or infection: Secondary | ICD-10-CM | POA: Diagnosis not present

## 2015-06-26 DIAGNOSIS — I4891 Unspecified atrial fibrillation: Secondary | ICD-10-CM

## 2015-06-26 DIAGNOSIS — G9340 Encephalopathy, unspecified: Secondary | ICD-10-CM | POA: Diagnosis present

## 2015-06-26 DIAGNOSIS — F10231 Alcohol dependence with withdrawal delirium: Secondary | ICD-10-CM | POA: Diagnosis present

## 2015-06-26 DIAGNOSIS — R945 Abnormal results of liver function studies: Secondary | ICD-10-CM

## 2015-06-26 DIAGNOSIS — Z7289 Other problems related to lifestyle: Secondary | ICD-10-CM

## 2015-06-26 DIAGNOSIS — Z8249 Family history of ischemic heart disease and other diseases of the circulatory system: Secondary | ICD-10-CM | POA: Diagnosis not present

## 2015-06-26 DIAGNOSIS — R7989 Other specified abnormal findings of blood chemistry: Secondary | ICD-10-CM

## 2015-06-26 DIAGNOSIS — K8502 Idiopathic acute pancreatitis with infected necrosis: Secondary | ICD-10-CM | POA: Diagnosis not present

## 2015-06-26 DIAGNOSIS — Z4689 Encounter for fitting and adjustment of other specified devices: Secondary | ICD-10-CM

## 2015-06-26 DIAGNOSIS — E038 Other specified hypothyroidism: Secondary | ICD-10-CM | POA: Diagnosis not present

## 2015-06-26 DIAGNOSIS — R32 Unspecified urinary incontinence: Secondary | ICD-10-CM | POA: Diagnosis not present

## 2015-06-26 DIAGNOSIS — E876 Hypokalemia: Secondary | ICD-10-CM | POA: Diagnosis not present

## 2015-06-26 DIAGNOSIS — E039 Hypothyroidism, unspecified: Secondary | ICD-10-CM | POA: Diagnosis present

## 2015-06-26 DIAGNOSIS — F1090 Alcohol use, unspecified, uncomplicated: Secondary | ICD-10-CM

## 2015-06-26 DIAGNOSIS — M109 Gout, unspecified: Secondary | ICD-10-CM | POA: Diagnosis present

## 2015-06-26 DIAGNOSIS — I480 Paroxysmal atrial fibrillation: Secondary | ICD-10-CM | POA: Diagnosis present

## 2015-06-26 DIAGNOSIS — D649 Anemia, unspecified: Secondary | ICD-10-CM | POA: Diagnosis present

## 2015-06-26 LAB — COMPREHENSIVE METABOLIC PANEL
ALBUMIN: 4.1 g/dL (ref 3.5–5.0)
ALT: 29 U/L (ref 17–63)
AST: 38 U/L (ref 15–41)
Alkaline Phosphatase: 45 U/L (ref 38–126)
Anion gap: 9 (ref 5–15)
BILIRUBIN TOTAL: 0.7 mg/dL (ref 0.3–1.2)
BUN: 18 mg/dL (ref 6–20)
CHLORIDE: 105 mmol/L (ref 101–111)
CO2: 26 mmol/L (ref 22–32)
CREATININE: 0.94 mg/dL (ref 0.61–1.24)
Calcium: 10.2 mg/dL (ref 8.9–10.3)
GFR calc Af Amer: >60 mL/min (ref >60)
GFR calc non Af Amer: >60 mL/min (ref >60)
GLUCOSE: 135 mg/dL — AB (ref 65–99)
POTASSIUM: 3.4 mmol/L — AB (ref 3.5–5.1)
Sodium: 140 mmol/L (ref 135–145)
TOTAL PROTEIN: 7.1 g/dL (ref 6.5–8.1)

## 2015-06-26 LAB — URINALYSIS, ROUTINE W REFLEX MICROSCOPIC
Bilirubin Urine: NEGATIVE
GLUCOSE, UA: NEGATIVE mg/dL
Ketones, ur: NEGATIVE mg/dL
Leukocytes, UA: NEGATIVE
Nitrite: NEGATIVE
PH: 5.5 (ref 5.0–8.0)
Protein, ur: NEGATIVE mg/dL
Specific Gravity, Urine: 1.02 (ref 1.005–1.030)

## 2015-06-26 LAB — CBC
HEMATOCRIT: 41.1 % (ref 39.0–52.0)
Hemoglobin: 14.1 g/dL (ref 13.0–17.0)
MCH: 32.3 pg (ref 26.0–34.0)
MCHC: 34.3 g/dL (ref 30.0–36.0)
MCV: 94.1 fL (ref 78.0–100.0)
PLATELETS: 166 10*3/uL (ref 150–400)
RBC: 4.37 MIL/uL (ref 4.22–5.81)
RDW: 13.1 % (ref 11.5–15.5)
WBC: 8 10*3/uL (ref 4.0–10.5)

## 2015-06-26 LAB — LIPASE, BLOOD: Lipase: >3000 U/L — ABNORMAL HIGH (ref 11–51)

## 2015-06-26 LAB — URINE MICROSCOPIC-ADD ON
BACTERIA UA: NONE SEEN
SQUAMOUS EPITHELIAL / LPF: NONE SEEN
WBC UA: NONE SEEN WBC/hpf (ref 0–5)

## 2015-06-26 LAB — LACTIC ACID, PLASMA: Lactic Acid, Venous: 3 mmol/L (ref 0.5–2.0)

## 2015-06-26 LAB — TROPONIN I: Troponin I: <0.03 ng/mL (ref <0.031)

## 2015-06-26 MED ORDER — LORAZEPAM 2 MG/ML IJ SOLN
1.0000 mg | Freq: Every evening | INTRAMUSCULAR | Status: DC | PRN
  Administered 2015-06-27 – 2015-06-30 (×3): 1 mg via INTRAVENOUS
  Filled 2015-06-26 (×3): qty 1

## 2015-06-26 MED ORDER — SENNOSIDES-DOCUSATE SODIUM 8.6-50 MG PO TABS
1.0000 | ORAL_TABLET | Freq: Every evening | ORAL | Status: DC | PRN
  Administered 2015-06-28 – 2015-07-01 (×2): 1 via ORAL
  Filled 2015-06-26 (×2): qty 1

## 2015-06-26 MED ORDER — FENTANYL CITRATE (PF) 100 MCG/2ML IJ SOLN
INTRAMUSCULAR | Status: AC
  Filled 2015-06-26: qty 2

## 2015-06-26 MED ORDER — SODIUM CHLORIDE 0.9 % IV SOLN
INTRAVENOUS | Status: DC
  Administered 2015-06-26: 17:00:00 via INTRAVENOUS

## 2015-06-26 MED ORDER — FENTANYL CITRATE (PF) 100 MCG/2ML IJ SOLN
50.0000 ug | INTRAMUSCULAR | Status: DC | PRN
  Administered 2015-06-26 – 2015-06-27 (×6): 50 ug via INTRAVENOUS
  Filled 2015-06-26 (×5): qty 2

## 2015-06-26 MED ORDER — SODIUM CHLORIDE 0.9 % IV BOLUS (SEPSIS)
1000.0000 mL | Freq: Once | INTRAVENOUS | Status: AC
  Administered 2015-06-26: 1000 mL via INTRAVENOUS

## 2015-06-26 MED ORDER — SODIUM CHLORIDE 0.9 % IV SOLN
Freq: Once | INTRAVENOUS | Status: AC
  Administered 2015-06-26: 150 mL/h via INTRAVENOUS

## 2015-06-26 MED ORDER — SODIUM CHLORIDE 0.9 % IV BOLUS (SEPSIS)
500.0000 mL | Freq: Once | INTRAVENOUS | Status: AC
  Administered 2015-06-26: 500 mL via INTRAVENOUS

## 2015-06-26 MED ORDER — IOPAMIDOL (ISOVUE-370) INJECTION 76%
100.0000 mL | Freq: Once | INTRAVENOUS | Status: AC | PRN
  Administered 2015-06-26: 100 mL via INTRAVENOUS

## 2015-06-26 MED ORDER — ONDANSETRON HCL 4 MG/2ML IJ SOLN
4.0000 mg | Freq: Once | INTRAMUSCULAR | Status: AC | PRN
  Administered 2015-06-26: 4 mg via INTRAVENOUS
  Filled 2015-06-26: qty 2

## 2015-06-26 MED ORDER — SODIUM CHLORIDE 0.9 % IV SOLN
INTRAVENOUS | Status: DC
  Administered 2015-06-26 – 2015-07-03 (×9): via INTRAVENOUS

## 2015-06-26 MED ORDER — ONDANSETRON HCL 4 MG PO TABS: 4.0000 mg | ORAL_TABLET | Freq: Four times a day (QID) | ORAL | Status: DC | PRN

## 2015-06-26 MED ORDER — ONDANSETRON HCL 4 MG/2ML IJ SOLN
4.0000 mg | Freq: Four times a day (QID) | INTRAMUSCULAR | Status: DC | PRN
  Administered 2015-06-27 – 2015-07-08 (×8): 4 mg via INTRAVENOUS
  Filled 2015-06-26 (×8): qty 2

## 2015-06-26 MED ORDER — ENOXAPARIN SODIUM 40 MG/0.4ML ~~LOC~~ SOLN
40.0000 mg | SUBCUTANEOUS | Status: DC
  Administered 2015-06-27 – 2015-07-11 (×15): 40 mg via SUBCUTANEOUS
  Filled 2015-06-26 (×16): qty 0.4

## 2015-06-26 MED ORDER — MORPHINE SULFATE (PF) 4 MG/ML IV SOLN
4.0000 mg | INTRAVENOUS | Status: DC | PRN
  Administered 2015-06-26 (×3): 4 mg via INTRAVENOUS
  Filled 2015-06-26 (×3): qty 1

## 2015-06-26 MED ORDER — PANTOPRAZOLE SODIUM 40 MG IV SOLR
40.0000 mg | INTRAVENOUS | Status: DC
  Administered 2015-06-26: 40 mg via INTRAVENOUS
  Filled 2015-06-26: qty 40

## 2015-06-26 MED ORDER — LEVOTHYROXINE SODIUM 100 MCG IV SOLR
75.0000 ug | Freq: Every day | INTRAVENOUS | Status: DC
  Administered 2015-06-27 – 2015-07-03 (×7): 75 ug via INTRAVENOUS
  Filled 2015-06-26 (×10): qty 5

## 2015-06-26 MED ORDER — HYDROMORPHONE HCL 1 MG/ML IJ SOLN
0.5000 mg | Freq: Once | INTRAMUSCULAR | Status: AC
  Administered 2015-06-26: 0.5 mg via INTRAVENOUS
  Filled 2015-06-26: qty 1

## 2015-06-26 NOTE — ED Provider Notes (Signed)
CSN: [id number]     Arrival date & time 06/26/15  0710 History   First MD Initiated Contact with Patient 06/26/15 [phone]     Chief Complaint  Patient presents with  . Abdominal Pain     (Consider location/radiation/quality/duration/timing/severity/associated sxs/prior Treatment) HPI  Patient is a 75 year old male with a history of hypothyroidism that presents [facility] emergency department for evaluation of severe abdominal pain. Patient states that his pain started this morning around 3:00 AM. Pain is located in his epigastric area and describes his throbbing and nonradiating. He describes feeling nauseous with an episode of small amount of nonbilious, nonbloody emesis. Abdominal pain is worse when he lies down and improves with standing. He has associated diaphoresis. He reports no associated fevers, chills, chest pain or shortness of breath, no diarrhea, no constipation, no melena or hematochezia. He took some Rolaids, which did not help the symptoms. Since starting, and his pain has worsened.Patient drinks about 4-5 ounces of liquor per day, every day.  Past Medical History  Diagnosis Date  . Arthritis   . Gout   . Thyroid disease    Past Surgical History  Procedure Laterality Date  . Knee arthroscopy    . Back surgery    . Colonoscopy  07/08/2011    Procedure: COLONOSCOPY;  Surgeon: Rogene Houston, MD;  Location: [facility];  Service: Endoscopy;  Laterality: N/A;  930  . Left heart catheterization with coronary angiogram N/A 05/07/2011    Procedure: LEFT HEART CATHETERIZATION WITH CORONARY ANGIOGRAM;  Surgeon: Peter M Martinique, MD;  Location: [facility] CATH LAB;  Service: Cardiovascular;  Laterality: N/A;   No family history on file. Social History  Substance Use Topics  . Smoking status: Never Smoker   . Smokeless tobacco: Never Used  . Alcohol Use: 30.0 oz/week    50 Shots of liquor per week    Review of Systems  Constitutional: Positive for diaphoresis. Negative for fever and  chills.  Respiratory: Negative for chest tightness and shortness of breath.   Gastrointestinal: Positive for nausea, vomiting and abdominal pain. Negative for diarrhea, constipation, blood in stool and abdominal distention.  Endocrine: Negative for cold intolerance and heat intolerance.  Neurological: Negative for dizziness, syncope, light-headedness and headaches.  All other systems reviewed and are negative.     Allergies  Review of patient's allergies indicates no known allergies.  Home Medications   Prior to Admission medications   Medication Sig Start Date End Date Taking? Authorizing Provider  albuterol (PROVENTIL HFA;VENTOLIN HFA) 108 (90 Base) MCG/ACT inhaler Inhale 1-2 puffs into the lungs every 6 (six) hours as needed for wheezing or shortness of breath. 05/04/15   Nat Christen, MD  azithromycin (ZITHROMAX) 250 MG tablet TAKE 2 TABLETS BY MOUTH TODAY, THEN TAKE 1 TABLET DAILY FOR 4 DAYS starting on 05/05/15 05/04/15   Historical Provider, MD  chlorpheniramine-HYDROcodone (TUSSIONEX) 10-8 MG/5ML SUER TAKE 5 ML BY MOUTH EVERY 12 HOURS AS NEEDED FOR COUGH 05/04/15   Historical Provider, MD  levofloxacin (LEVAQUIN) 500 MG tablet Take 1 tablet (500 mg total) by mouth daily. 05/08/15   Milton Ferguson, MD  levothyroxine (SYNTHROID) 50 MCG tablet Take 1 tablet (50 mcg total) by mouth every morning. 05/09/11 05/08/12  Roger A Arguello, PA-C  levothyroxine (SYNTHROID, LEVOTHROID) 150 MCG tablet Take 150 mcg by mouth daily. 03/14/15   Historical Provider, MD  loratadine (CLARITIN) 10 MG tablet Take 10 mg by mouth daily.    Historical Provider, MD  metoprolol succinate (TOPROL XL) 25  MG 24 hr tablet Take 1 tablet (25 mg total) by mouth daily. 05/09/11 05/08/12  Roger A Arguello, PA-C  pantoprazole (PROTONIX) 40 MG tablet Take 1 tablet (40 mg total) by mouth daily. 05/09/11 05/08/12  Roger A Arguello, PA-C  predniSONE (DELTASONE) 20 MG tablet 2 tabs po daily x 3 days 05/08/15   Milton Ferguson, MD  traMADol  (ULTRAM) 50 MG tablet Take 1 tablet (50 mg total) by mouth every 6 (six) hours as needed. 05/08/15   Milton Ferguson, MD  zolpidem (AMBIEN) 5 MG tablet Take 0.5 tablets (2.5 mg total) by mouth at bedtime as needed for sleep. 05/15/11 05/14/12  Lendon Colonel, NP   BP 145/65 mmHg  Pulse 62  Temp(Src) 98.6 F (37 C) (Oral)  Resp 21  Ht 5\' 11"  (1.803 m)  Wt 104.327 kg  BMI 32.09 kg/m2  SpO2 100% Physical Exam  Constitutional: He is oriented to person, place, and time. He appears well-developed and well-nourished. No distress.  Eyes: Conjunctivae and EOM are normal. Pupils are equal, round, and reactive to light.  Neck: Normal range of motion. Neck supple.  Cardiovascular: Normal rate and regular rhythm.   Murmur heard. Pulmonary/Chest: Effort normal and breath sounds normal.  Abdominal: Soft. Bowel sounds are normal. He exhibits no distension, no abdominal bruit and no mass. There is tenderness in the epigastric area, periumbilical area and left upper quadrant. There is no rigidity, no rebound, no guarding, no tenderness at McBurney's point and negative Murphy's sign.  Musculoskeletal: Normal range of motion. He exhibits no tenderness.       Right lower leg: He exhibits edema (trace pretibial).       Left lower leg: He exhibits edema (trace pretibial).  Neurological: He is alert and oriented to person, place, and time.  Skin: Skin is warm. He is not diaphoretic.    ED Course  Procedures (including critical care time) Labs Review Labs Reviewed  LIPASE, BLOOD - Abnormal; Notable for the following:    Lipase >3000 (*)    All other components within normal limits  COMPREHENSIVE METABOLIC PANEL - Abnormal; Notable for the following:    Potassium 3.4 (*)    Glucose, Bld 135 (*)    All other components within normal limits  URINALYSIS, ROUTINE W REFLEX MICROSCOPIC (NOT AT [facility]) - Abnormal; Notable for the following:    Hgb urine dipstick TRACE (*)    All other components within normal  limits  LACTIC ACID, PLASMA - Abnormal; Notable for the following:    Lactic Acid, Venous 3.0 (*)    All other components within normal limits  CBC  TROPONIN I  URINE MICROSCOPIC-ADD ON    Imaging Review Dg Chest 2 View  06/26/2015  CLINICAL DATA:  Epigastric pain EXAM: CHEST  2 VIEW COMPARISON:  05/08/2015 FINDINGS: The heart size and mediastinal contours are within normal limits. Both lungs are clear. The visualized skeletal structures are unremarkable. IMPRESSION: No active cardiopulmonary disease. Electronically Signed   By: Inez Catalina M.D.   On: 06/26/2015 08:01   US Scrotum  06/25/2015  CLINICAL DATA:  Micro hematuria, left testicular pain for 6 months with some swelling EXAM: ULTRASOUND OF SCROTUM TECHNIQUE: Complete ultrasound examination of the testicles, epididymis, and other scrotal structures was performed. COMPARISON:  None. FINDINGS: Right testicle Measurements: The right testicle measures 4.1 x 2.4 x 2.7 cm. No intratesticular abnormality is seen. Blood flow is demonstrated to the right testicle with arterial and venous waveforms. Small calcifications are present which  appear benign. Left testicle Measurements: The left testicle measures 3.7 x 2.0 x 3.0 cm. No intratesticular abnormality is noted. Blood flow is demonstrated to the left testicle with arterial and venous waveforms. Small calcifications are noted which appear benign. Right epididymis:  The right epididymis is unremarkable. Left epididymis:  The left epididymis also is unremarkable. Hydrocele:  Only a small amount of fluid is noted on the left. Varicocele:  Conceal is seen. IMPRESSION: 1. No intratesticular abnormality is seen. Blood flow is demonstrated to both testicles. 2. Small amount of fluid on the left. Electronically Signed   By: Ivar Drape M.D.   On: 06/25/2015 12:36   [place] Art/ven Flow Abd Pelv Doppler  06/25/2015  CLINICAL DATA:  Micro hematuria, left testicular pain for 6 months with some swelling EXAM:  ULTRASOUND OF SCROTUM TECHNIQUE: Complete ultrasound examination of the testicles, epididymis, and other scrotal structures was performed. COMPARISON:  None. FINDINGS: Right testicle Measurements: The right testicle measures 4.1 x 2.4 x 2.7 cm. No intratesticular abnormality is seen. Blood flow is demonstrated to the right testicle with arterial and venous waveforms. Small calcifications are present which appear benign. Left testicle Measurements: The left testicle measures 3.7 x 2.0 x 3.0 cm. No intratesticular abnormality is noted. Blood flow is demonstrated to the left testicle with arterial and venous waveforms. Small calcifications are noted which appear benign. Right epididymis:  The right epididymis is unremarkable. Left epididymis:  The left epididymis also is unremarkable. Hydrocele:  Only a small amount of fluid is noted on the left. Varicocele:  Conceal is seen. IMPRESSION: 1. No intratesticular abnormality is seen. Blood flow is demonstrated to both testicles. 2. Small amount of fluid on the left. Electronically Signed   By: Ivar Drape M.D.   On: 06/25/2015 12:36   Ct Cta Abd/pel W/cm &/or W/o Cm  06/26/2015  CLINICAL DATA:  Severe abdominal pain for several hours, initial encounter EXAM: CTA ABDOMEN AND PELVIS wITHOUT AND WITH CONTRAST TECHNIQUE: Multidetector CT imaging of the abdomen and pelvis was performed using the standard protocol during bolus administration of intravenous contrast. Multiplanar reconstructed images and MIPs were obtained and reviewed to evaluate the vascular anatomy. CONTRAST:  100 mL Isovue 370. COMPARISON:  None. FINDINGS: Lung bases are free of acute infiltrate or sizable effusion. The liver, gallbladder, spleen, adrenal glands and kidneys are within normal limits with the exception of a single nonobstructing left renal stone in the lower pole. It measures approximately 8 mm. No obstructive changes are seen. The pancreas is well visualized and peripancreatic inflammatory  changes are noted surrounding the head and uncinate process. These also developed the duodenum. No focal pseudocyst is seen. No significant phlegmon is noted. The abdominal aorta and its branches are within normal limits. No aneurysmal dilatation is seen. No significant mesenteric ischemia is seen. The bladder is well distended. No pelvic mass lesion is seen. The appendix is partially visualized. No inflammatory changes are seen. The osseous structures show degenerative change of the lumbar spine. No acute bony abnormality is seen. Review of the MIP images confirms the above findings. IMPRESSION: Nonobstructing left lower pole renal calculus. Inflammatory changes in the region of the pancreatic head and uncinate process also developing the duodenum. These changes are consistent with acute pancreatitis. Correlation with laboratory values is recommended. No other focal abnormality is seen. Electronically Signed   By: Inez Catalina M.D.   On: 06/26/2015 09:37   I have personally reviewed and evaluated these images and lab results as part  of my medical decision-making.   EKG Interpretation   Date/Time:  Wednesday Jun 26 2015 07:20:44 EDT Ventricular Rate:  54 PR Interval:  186 QRS Duration: 86 QT Interval:  453 QTC Calculation: 429 R Axis:   20 Text Interpretation:  Sinus rhythm No significant change was found  Confirmed by Ericson ([id number]) on 06/26/2015 7:28:36 AM       MDM   Final diagnoses:  Idiopathic acute pancreatitis without infection or necrosis    Patient with significant abdominal pain. Lipase elevated to greater than 2000, and lactic acid elevated at 3. Patient given 1.5 L so far, and multiple rounds of IV analgesics, which has helped mildly. CT scan significant for pancreatitis. Appears to be no evidence of gallstones, cholecystitis on CT. Called [facility] for admission for IV pain management, and management of acute pancreatitis. Dr. Jerilee Hoh accept the patient,  and will transfer patient to MedSurg bed.    Mariel Aloe, MD 06/26/15 [place], MD 06/26/15 [place], MD 06/28/15 [phone]

## 2015-06-26 NOTE — ED Notes (Signed)
Pt complain of severe abdominal pain that started around 0300. States he took Tums without relief. Pt states he has been sweating and had to change his shirt three times

## 2015-06-26 NOTE — Consult Note (Signed)
Referring Provider: Thersa Salt, MD Primary Care Physician:  Wende Neighbors, MD Primary Gastroenterologist:  Dr. Laural Golden  Reason for Consultation:    Acute pancreatitis.  HPI:   Patient is 75 year old Caucasian male who was in usual state of health until this morning when he woke up around 3:30 with severe pain across his upper abdomen. He felt nauseated and heaved a few times. He did not experience chest pain shortness of breath fever or chills. Pain was unbearable. He states he's never had pain like this before. Patient came to emergency room. Workup revealed pancreatitis. He was given a bolus of 1.5 L of IV fluids. He was admitted to hospitalist service for further management of his condition. He states pain has also moved to right low quadrant. Most of his pain is in epigastric region though. He denies recent weight loss anorexia or abdominal pain. His bowels been moving regularly. He denies melena or rectal bleeding. He also denies heartburn or dysphagia. States he had an episode of hematuria a few weeks ago. He noted left scrotal swelling. He had scrotal ultrasound yesterday which revealed small hydrocele. Patient states he drinks 3 ounces of bourbon every evening but last night he had total of 6 drinks. He states he has never drank alcohol to the point of intoxication. He complains of left hip pain. He has been taking OTC ibuprofen no more than 600 mg on most days. Family history is negative for pancreatitis.   Past Medical History  Diagnosis Date  . Arthritis   . Gout   . Hypothyroidism         Left hip osteoarthrosis.  Past Surgical History  Procedure Laterality Date  . Knee arthroscopy    . Back surgery    . Colonoscopy  07/08/2011    Procedure: COLONOSCOPY;  Surgeon: Rogene Houston, MD;  Location: [facility];  Service: Endoscopy;  Laterality: N/A;  930  . Left heart catheterization with coronary angiogram N/A 05/07/2011    Procedure: LEFT HEART CATHETERIZATION WITH  CORONARY ANGIOGRAM;  Surgeon: Peter M Martinique, MD;  Location: [facility] CATH LAB;  Service: Cardiovascular;  Laterality: N/A;    Prior to Admission medications   Medication Sig Start Date End Date Taking? Authorizing Provider  albuterol (PROVENTIL HFA;VENTOLIN HFA) 108 (90 Base) MCG/ACT inhaler Inhale 1-2 puffs into the lungs every 6 (six) hours as needed for wheezing or shortness of breath. 05/04/15  Yes Nat Christen, MD  chlorpheniramine-HYDROcodone (TUSSIONEX) 10-8 MG/5ML SUER TAKE 5 ML BY MOUTH EVERY 12 HOURS AS NEEDED FOR COUGH 05/04/15  Yes Historical Provider, MD  levothyroxine (SYNTHROID, LEVOTHROID) 150 MCG tablet Take 150 mcg by mouth daily. 03/14/15  Yes Historical Provider, MD  loratadine (CLARITIN) 10 MG tablet Take 10 mg by mouth daily.   Yes Historical Provider, MD  azithromycin (ZITHROMAX) 250 MG tablet Reported on 06/26/2015 05/04/15   Historical Provider, MD  levofloxacin (LEVAQUIN) 500 MG tablet Take 1 tablet (500 mg total) by mouth daily. Patient not taking: Reported on 06/26/2015 05/08/15   Milton Ferguson, MD  levothyroxine (SYNTHROID) 50 MCG tablet Take 1 tablet (50 mcg total) by mouth every morning. 05/09/11 05/08/12  Roger A Arguello, PA-C  metoprolol succinate (TOPROL XL) 25 MG 24 hr tablet Take 1 tablet (25 mg total) by mouth daily. 05/09/11 05/08/12  Roger A Arguello, PA-C  pantoprazole (PROTONIX) 40 MG tablet Take 1 tablet (40 mg total) by mouth daily. 05/09/11 05/08/12  Roger A Arguello, PA-C  predniSONE (DELTASONE) 20 MG tablet 2 tabs  po daily x 3 days Patient not taking: Reported on 06/26/2015 05/08/15   Milton Ferguson, MD  traMADol (ULTRAM) 50 MG tablet Take 1 tablet (50 mg total) by mouth every 6 (six) hours as needed. Patient not taking: Reported on 06/26/2015 05/08/15   Milton Ferguson, MD  zolpidem (AMBIEN) 5 MG tablet Take 0.5 tablets (2.5 mg total) by mouth at bedtime as needed for sleep. 05/15/11 05/14/12  Lendon Colonel, NP    Current Facility-Administered Medications  Medication Dose  Route Frequency Provider Last Rate Last Dose  . 0.9 %  sodium chloride infusion   Intravenous Continuous Erline Hau, MD      . enoxaparin (LOVENOX) injection 40 mg  40 mg Subcutaneous Q24H Erline Hau, MD      . fentaNYL (SUBLIMAZE) injection 50 mcg  50 mcg Intravenous Q2H PRN Elnora Morrison, MD   50 mcg at 06/26/15 0732  . levothyroxine (SYNTHROID, LEVOTHROID) injection 75 mcg  75 mcg Intravenous Daily Erline Hau, MD      . ondansetron [facility]) tablet 4 mg  4 mg Oral Q6H PRN Erline Hau, MD       Or  . ondansetron [facility]) injection 4 mg  4 mg Intravenous Q6H PRN Erline Hau, MD      . senna-docusate (Senokot-S) tablet 1 tablet  1 tablet Oral QHS PRN Erline Hau, MD        Allergies as of 06/26/2015  . (No Known Allergies)    No family history on file.  Social History   Social History  . Marital Status: Married    Spouse Name: N/A  . Number of Children: N/A  . Years of Education: N/A   Occupational History  . Not on file.   Social History Main Topics  . Smoking status: Never Smoker   . Smokeless tobacco: Never Used  . Alcohol Use: 30.0 oz/week    50 Shots of liquor per week  . Drug Use: No  . Sexual Activity: Not on file   Other Topics Concern  . Not on file   Social History Narrative    Review of Systems: See HPI, otherwise normal ROS  Physical Exam: Temp:  [97.8 F (36.6 C)-98.6 F (37 C)] 97.8 F (36.6 C) (05/31 1528) Pulse Rate:  [61-114] 78 (05/31 1528) Resp:  [16-24] 16 (05/31 1528) BP: (118-159)/(55-98) 146/75 mmHg (05/31 1528) SpO2:  [94 %-100 %] 98 % (05/31 1528) Weight:  [230 lb (104.327 kg)-230 lb 6.4 oz (104.509 kg)] 230 lb 6.4 oz (104.509 kg) (05/31 1528) Last BM Date: 06/26/15  Patient is alert and appears to be in pain and restless. Conjunctiva was pink. Sclerae nonicteric. Oropharyngeal mucosa is normal. No neck masses or thyromegaly noted. Cardiac exam  with regular rhythm normal S1 and S2. He has faint systolic ejection murmur best heard at left sternal border. Auscultation of lungs reveals vesicular breath sounds. Abdomen is full. Bowel sounds are normal. On palpation abdomen is soft with moderate tenderness in midepigastric region. He has mild tenderness in right low quadrant. No organomegaly or masses. No peripheral edema or clubbing noted.    Lab Results:  Recent Labs  06/26/15 0730  WBC 8.0  HGB 14.1  HCT 41.1  PLT 166   BMET  Recent Labs  06/26/15 0730  NA 140  K 3.4*  CL 105  CO2 26  GLUCOSE 135*  BUN 18  CREATININE 0.94  CALCIUM 10.2   LFT  Recent Labs  06/26/15 0730  PROT 7.1  ALBUMIN 4.1  AST 38  ALT 29  ALKPHOS 45  BILITOT 0.7   PT/INR No results for input(s): LABPROT, INR in the last 72 hours. Hepatitis Panel No results for input(s): HEPBSAG, HCVAB, HEPAIGM, HEPBIGM in the last 72 hours.  Studies/Results: Dg Chest 2 View  06/26/2015  CLINICAL DATA:  Epigastric pain EXAM: CHEST  2 VIEW COMPARISON:  05/08/2015 FINDINGS: The heart size and mediastinal contours are within normal limits. Both lungs are clear. The visualized skeletal structures are unremarkable. IMPRESSION: No active cardiopulmonary disease. Electronically Signed   By: Inez Catalina M.D.   On: 06/26/2015 08:01   US Scrotum  06/25/2015  CLINICAL DATA:  Micro hematuria, left testicular pain for 6 months with some swelling EXAM: ULTRASOUND OF SCROTUM TECHNIQUE: Complete ultrasound examination of the testicles, epididymis, and other scrotal structures was performed. COMPARISON:  None. FINDINGS: Right testicle Measurements: The right testicle measures 4.1 x 2.4 x 2.7 cm. No intratesticular abnormality is seen. Blood flow is demonstrated to the right testicle with arterial and venous waveforms. Small calcifications are present which appear benign. Left testicle Measurements: The left testicle measures 3.7 x 2.0 x 3.0 cm. No intratesticular  abnormality is noted. Blood flow is demonstrated to the left testicle with arterial and venous waveforms. Small calcifications are noted which appear benign. Right epididymis:  The right epididymis is unremarkable. Left epididymis:  The left epididymis also is unremarkable. Hydrocele:  Only a small amount of fluid is noted on the left. Varicocele:  Conceal is seen. IMPRESSION: 1. No intratesticular abnormality is seen. Blood flow is demonstrated to both testicles. 2. Small amount of fluid on the left. Electronically Signed   By: Ivar Drape M.D.   On: 06/25/2015 12:36   [place] Art/ven Flow Abd Pelv Doppler  06/25/2015  CLINICAL DATA:  Micro hematuria, left testicular pain for 6 months with some swelling EXAM: ULTRASOUND OF SCROTUM TECHNIQUE: Complete ultrasound examination of the testicles, epididymis, and other scrotal structures was performed. COMPARISON:  None. FINDINGS: Right testicle Measurements: The right testicle measures 4.1 x 2.4 x 2.7 cm. No intratesticular abnormality is seen. Blood flow is demonstrated to the right testicle with arterial and venous waveforms. Small calcifications are present which appear benign. Left testicle Measurements: The left testicle measures 3.7 x 2.0 x 3.0 cm. No intratesticular abnormality is noted. Blood flow is demonstrated to the left testicle with arterial and venous waveforms. Small calcifications are noted which appear benign. Right epididymis:  The right epididymis is unremarkable. Left epididymis:  The left epididymis also is unremarkable. Hydrocele:  Only a small amount of fluid is noted on the left. Varicocele:  Conceal is seen. IMPRESSION: 1. No intratesticular abnormality is seen. Blood flow is demonstrated to both testicles. 2. Small amount of fluid on the left. Electronically Signed   By: Ivar Drape M.D.   On: 06/25/2015 12:36   Ct Cta Abd/pel W/cm &/or W/o Cm  06/26/2015  CLINICAL DATA:  Severe abdominal pain for several hours, initial encounter EXAM: CTA  ABDOMEN AND PELVIS wITHOUT AND WITH CONTRAST TECHNIQUE: Multidetector CT imaging of the abdomen and pelvis was performed using the standard protocol during bolus administration of intravenous contrast. Multiplanar reconstructed images and MIPs were obtained and reviewed to evaluate the vascular anatomy. CONTRAST:  100 mL Isovue 370. COMPARISON:  None. FINDINGS: Lung bases are free of acute infiltrate or sizable effusion. The liver, gallbladder, spleen, adrenal glands and kidneys are within normal limits with  the exception of a single nonobstructing left renal stone in the lower pole. It measures approximately 8 mm. No obstructive changes are seen. The pancreas is well visualized and peripancreatic inflammatory changes are noted surrounding the head and uncinate process. These also developed the duodenum. No focal pseudocyst is seen. No significant phlegmon is noted. The abdominal aorta and its branches are within normal limits. No aneurysmal dilatation is seen. No significant mesenteric ischemia is seen. The bladder is well distended. No pelvic mass lesion is seen. The appendix is partially visualized. No inflammatory changes are seen. The osseous structures show degenerative change of the lumbar spine. No acute bony abnormality is seen. Review of the MIP images confirms the above findings. IMPRESSION: Nonobstructing left lower pole renal calculus. Inflammatory changes in the region of the pancreatic head and uncinate process also developing the duodenum. These changes are consistent with acute pancreatitis. Correlation with laboratory values is recommended. No other focal abnormality is seen. Electronically Signed   By: Inez Catalina M.D.   On: 06/26/2015 09:37    Assessment;  Patient is 75 year old Caucasian male who presents with acute onset of upper abdominal pain and on workup found to have markedly elevated serum lipase and CT reveals changes of pancreatitis involving pancreatic head and uncinate  process. He appears to have alcoholic pancreatitis. Mildly elevated AST is secondary to pancreatitis as he does not have any other abnormalities to suggest alcoholic pancreatitis. Renal function is well-preserved. He is afebrile. No indication for antibiotic at this time. Goals of therapy at this time are to keep him well-hydrated and pain control. Clinical course over the next 24-48 hours we will determine prognosis.   Recommendations;  Nothing by mouth except ice chips by mouth medications. Pantoprazole 40 mg IV every 24 hours. Abdominal ultrasound will be obtained prior to discharge.   LOS: 0 days   Andrew Watson  06/26/2015, 7:15 PM

## 2015-06-26 NOTE — H&P (Signed)
History and Physical    Andrew Watson [id number] DOB: 06-Nov-1940 DOA: 06/26/2015  Referring MD/NP/PA: Elnora Morrison, EDP PCP: Wende Neighbors, MD  Patient coming from: Home  Chief Complaint: Abdominal pain  HPI: Andrew Watson is a 75 y.o. male with history of hypothyroidism who comes to the emergency department today for evaluation of severe abdominal pain that he describes as epigastric and throbbing and nonradiating. Also describes feeling nauseous with some emesis. He does admit to drinking 4-5 ounces of liquor per day. Workup in the emergency department shows a potassium of 3.4, lipase of greater than 3000. CT scan of the abdomen is consistent with acute pancreatitis without signs of necrosis. Given severe pain we have been asked to admit him for further evaluation and management.  Past Medical/Surgical History: Past Medical History  Diagnosis Date  . Arthritis   . Gout   . Thyroid disease     Past Surgical History  Procedure Laterality Date  . Knee arthroscopy    . Back surgery    . Colonoscopy  07/08/2011    Procedure: COLONOSCOPY;  Surgeon: Rogene Houston, MD;  Location: [facility];  Service: Endoscopy;  Laterality: N/A;  930  . Left heart catheterization with coronary angiogram N/A 05/07/2011    Procedure: LEFT HEART CATHETERIZATION WITH CORONARY ANGIOGRAM;  Surgeon: Peter M Martinique, MD;  Location: [facility] CATH LAB;  Service: Cardiovascular;  Laterality: N/A;     reports that he has never smoked. He has never used smokeless tobacco. He reports that he drinks about 30.0 oz of alcohol per week. He reports that he does not use illicit drugs.  Allergies: No Known Allergies  Family History:  Hypertension in one parent, celiac disease in son  Prior to Admission medications   Medication Sig Start Date End Date Taking? Authorizing Provider  albuterol (PROVENTIL HFA;VENTOLIN HFA) 108 (90 Base) MCG/ACT inhaler Inhale 1-2 puffs into the lungs every 6 (six) hours as needed for  wheezing or shortness of breath. 05/04/15  Yes Nat Christen, MD  chlorpheniramine-HYDROcodone (TUSSIONEX) 10-8 MG/5ML SUER TAKE 5 ML BY MOUTH EVERY 12 HOURS AS NEEDED FOR COUGH 05/04/15  Yes Historical Provider, MD  levothyroxine (SYNTHROID, LEVOTHROID) 150 MCG tablet Take 150 mcg by mouth daily. 03/14/15  Yes Historical Provider, MD  loratadine (CLARITIN) 10 MG tablet Take 10 mg by mouth daily.   Yes Historical Provider, MD  azithromycin (ZITHROMAX) 250 MG tablet Reported on 06/26/2015 05/04/15   Historical Provider, MD  levofloxacin (LEVAQUIN) 500 MG tablet Take 1 tablet (500 mg total) by mouth daily. Patient not taking: Reported on 06/26/2015 05/08/15   Milton Ferguson, MD  levothyroxine (SYNTHROID) 50 MCG tablet Take 1 tablet (50 mcg total) by mouth every morning. 05/09/11 05/08/12  Roger A Arguello, PA-C  metoprolol succinate (TOPROL XL) 25 MG 24 hr tablet Take 1 tablet (25 mg total) by mouth daily. 05/09/11 05/08/12  Roger A Arguello, PA-C  pantoprazole (PROTONIX) 40 MG tablet Take 1 tablet (40 mg total) by mouth daily. 05/09/11 05/08/12  Roger A Arguello, PA-C  predniSONE (DELTASONE) 20 MG tablet 2 tabs po daily x 3 days Patient not taking: Reported on 06/26/2015 05/08/15   Milton Ferguson, MD  traMADol (ULTRAM) 50 MG tablet Take 1 tablet (50 mg total) by mouth every 6 (six) hours as needed. Patient not taking: Reported on 06/26/2015 05/08/15   Milton Ferguson, MD  zolpidem (AMBIEN) 5 MG tablet Take 0.5 tablets (2.5 mg total) by mouth at bedtime as needed for sleep. 05/15/11  05/14/12  Lendon Colonel, NP    Review of Systems:  Constitutional: Denies fever, chills, diaphoresis, appetite change and fatigue.  HEENT: Denies photophobia, eye pain, redness, hearing loss, ear pain, congestion, sore throat, rhinorrhea, sneezing, mouth sores, trouble swallowing, neck pain, neck stiffness and tinnitus.   Respiratory: Denies SOB, DOE, cough, chest tightness,  and wheezing.   Cardiovascular: Denies chest pain, palpitations and  leg swelling.  Gastrointestinal: Denies  diarrhea, constipation, blood in stool and abdominal distention.  Genitourinary: Denies dysuria, urgency, frequency, hematuria, flank pain and difficulty urinating.  Endocrine: Denies: hot or cold intolerance, sweats, changes in hair or nails, polyuria, polydipsia. Musculoskeletal: Denies myalgias, back pain, joint swelling, arthralgias and gait problem.  Skin: Denies pallor, rash and wound.  Neurological: Denies dizziness, seizures, syncope, weakness, light-headedness, numbness and headaches.  Hematological: Denies adenopathy. Easy bruising, personal or family bleeding history  Psychiatric/Behavioral: Denies suicidal ideation, mood changes, confusion, nervousness, sleep disturbance and agitation    Physical Exam: Filed Vitals:   06/26/15 1400 06/26/15 1430 06/26/15 1511 06/26/15 1528  BP: 118/55 137/83 159/95 146/75  Pulse: 77 74 72 78  Temp:    97.8 F (36.6 C)  TempSrc:    Oral  Resp:   16 16  Height:    5\' 11"  (1.803 m)  Weight:    104.509 kg (230 lb 6.4 oz)  SpO2: 97% 95% 97% 98%     Constitutional: Lethargic after receiving dose of fentanyl opens eyes briefly to voice but falls quickly back to sleep Eyes: PERRL, lids and conjunctivae normal ENMT: Mucous membranes are moist. Posterior pharynx clear of any exudate or lesions.Normal dentition.  Neck: normal, supple, no masses, no thyromegaly Respiratory: clear to auscultation bilaterally, no wheezing, no crackles. Normal respiratory effort. No accessory muscle use.  Cardiovascular: Regular rate and rhythm, no murmurs / rubs / gallops. No extremity edema. 2+ pedal pulses. No carotid bruits.  Abdomen: Tenderness to palpation of epigastric area, hypoactive bowel sounds Musculoskeletal: no clubbing / cyanosis. No joint deformity upper and lower extremities. Good ROM, no contractures. Normal muscle tone.  Skin: no rashes, lesions, ulcers. No induration Neurologic: Unable to fully assess given  current mental state Psychiatric: Unable to fully assess given current mental state   Labs on Admission: I have personally reviewed the following labs and imaging studies  CBC:  Recent Labs Lab 06/26/15 0730  WBC 8.0  HGB 14.1  HCT 41.1  MCV 94.1  PLT [id number]   Basic Metabolic Panel:  Recent Labs Lab 06/26/15 0730  NA 140  K 3.4*  CL 105  CO2 26  GLUCOSE 135*  BUN 18  CREATININE 0.94  CALCIUM 10.2   GFR: Estimated Creatinine Clearance: 83.6 mL/min (by C-G formula based on Cr of 0.94). Liver Function Tests:  Recent Labs Lab 06/26/15 0730  AST 38  ALT 29  ALKPHOS 45  BILITOT 0.7  PROT 7.1  ALBUMIN 4.1    Recent Labs Lab 06/26/15 0730  LIPASE >3000*   No results for input(s): AMMONIA in the last 168 hours. Coagulation Profile: No results for input(s): INR, PROTIME in the last 168 hours. Cardiac Enzymes:  Recent Labs Lab 06/26/15 0730  TROPONINI <0.03   BNP (last 3 results) No results for input(s): PROBNP in the last 8760 hours. HbA1C: No results for input(s): HGBA1C in the last 72 hours. CBG: No results for input(s): GLUCAP in the last 168 hours. Lipid Profile: No results for input(s): CHOL, HDL, LDLCALC, TRIG, CHOLHDL, LDLDIRECT in the last 72  hours. Thyroid Function Tests: No results for input(s): TSH, T4TOTAL, FREET4, T3FREE, THYROIDAB in the last 72 hours. Anemia Panel: No results for input(s): VITAMINB12, FOLATE, FERRITIN, TIBC, IRON, RETICCTPCT in the last 72 hours. Urine analysis:    Component Value Date/Time   COLORURINE YELLOW 06/26/2015 [place] 06/26/2015 0909   LABSPEC 1.020 06/26/2015 0909   PHURINE 5.5 06/26/2015 0909   GLUCOSEU NEGATIVE 06/26/2015 0909   HGBUR TRACE* 06/26/2015 0909   BILIRUBINUR NEGATIVE 06/26/2015 0909   KETONESUR NEGATIVE 06/26/2015 0909   PROTEINUR NEGATIVE 06/26/2015 0909   NITRITE NEGATIVE 06/26/2015 0909   LEUKOCYTESUR NEGATIVE 06/26/2015 0909   Sepsis  Labs: @LABRCNTIP (procalcitonin:4,lacticidven:4) )No results found for this or any previous visit (from the past 240 hour(s)).   Radiological Exams on Admission: Dg Chest 2 View  06/26/2015  CLINICAL DATA:  Epigastric pain EXAM: CHEST  2 VIEW COMPARISON:  05/08/2015 FINDINGS: The heart size and mediastinal contours are within normal limits. Both lungs are clear. The visualized skeletal structures are unremarkable. IMPRESSION: No active cardiopulmonary disease. Electronically Signed   By: Inez Catalina M.D.   On: 06/26/2015 08:01   US Scrotum  06/25/2015  CLINICAL DATA:  Micro hematuria, left testicular pain for 6 months with some swelling EXAM: ULTRASOUND OF SCROTUM TECHNIQUE: Complete ultrasound examination of the testicles, epididymis, and other scrotal structures was performed. COMPARISON:  None. FINDINGS: Right testicle Measurements: The right testicle measures 4.1 x 2.4 x 2.7 cm. No intratesticular abnormality is seen. Blood flow is demonstrated to the right testicle with arterial and venous waveforms. Small calcifications are present which appear benign. Left testicle Measurements: The left testicle measures 3.7 x 2.0 x 3.0 cm. No intratesticular abnormality is noted. Blood flow is demonstrated to the left testicle with arterial and venous waveforms. Small calcifications are noted which appear benign. Right epididymis:  The right epididymis is unremarkable. Left epididymis:  The left epididymis also is unremarkable. Hydrocele:  Only a small amount of fluid is noted on the left. Varicocele:  Conceal is seen. IMPRESSION: 1. No intratesticular abnormality is seen. Blood flow is demonstrated to both testicles. 2. Small amount of fluid on the left. Electronically Signed   By: Ivar Drape M.D.   On: 06/25/2015 12:36   [place] Art/ven Flow Abd Pelv Doppler  06/25/2015  CLINICAL DATA:  Micro hematuria, left testicular pain for 6 months with some swelling EXAM: ULTRASOUND OF SCROTUM TECHNIQUE: Complete ultrasound  examination of the testicles, epididymis, and other scrotal structures was performed. COMPARISON:  None. FINDINGS: Right testicle Measurements: The right testicle measures 4.1 x 2.4 x 2.7 cm. No intratesticular abnormality is seen. Blood flow is demonstrated to the right testicle with arterial and venous waveforms. Small calcifications are present which appear benign. Left testicle Measurements: The left testicle measures 3.7 x 2.0 x 3.0 cm. No intratesticular abnormality is noted. Blood flow is demonstrated to the left testicle with arterial and venous waveforms. Small calcifications are noted which appear benign. Right epididymis:  The right epididymis is unremarkable. Left epididymis:  The left epididymis also is unremarkable. Hydrocele:  Only a small amount of fluid is noted on the left. Varicocele:  Conceal is seen. IMPRESSION: 1. No intratesticular abnormality is seen. Blood flow is demonstrated to both testicles. 2. Small amount of fluid on the left. Electronically Signed   By: Ivar Drape M.D.   On: 06/25/2015 12:36   Ct Cta Abd/pel W/cm &/or W/o Cm  06/26/2015  CLINICAL DATA:  Severe abdominal pain for several  hours, initial encounter EXAM: CTA ABDOMEN AND PELVIS wITHOUT AND WITH CONTRAST TECHNIQUE: Multidetector CT imaging of the abdomen and pelvis was performed using the standard protocol during bolus administration of intravenous contrast. Multiplanar reconstructed images and MIPs were obtained and reviewed to evaluate the vascular anatomy. CONTRAST:  100 mL Isovue 370. COMPARISON:  None. FINDINGS: Lung bases are free of acute infiltrate or sizable effusion. The liver, gallbladder, spleen, adrenal glands and kidneys are within normal limits with the exception of a single nonobstructing left renal stone in the lower pole. It measures approximately 8 mm. No obstructive changes are seen. The pancreas is well visualized and peripancreatic inflammatory changes are noted surrounding the head and uncinate  process. These also developed the duodenum. No focal pseudocyst is seen. No significant phlegmon is noted. The abdominal aorta and its branches are within normal limits. No aneurysmal dilatation is seen. No significant mesenteric ischemia is seen. The bladder is well distended. No pelvic mass lesion is seen. The appendix is partially visualized. No inflammatory changes are seen. The osseous structures show degenerative change of the lumbar spine. No acute bony abnormality is seen. Review of the MIP images confirms the above findings. IMPRESSION: Nonobstructing left lower pole renal calculus. Inflammatory changes in the region of the pancreatic head and uncinate process also developing the duodenum. These changes are consistent with acute pancreatitis. Correlation with laboratory values is recommended. No other focal abnormality is seen. Electronically Signed   By: Inez Catalina M.D.   On: 06/26/2015 09:37    EKG: Independently reviewed. Sinus rhythm at a rate of 56, normal axis, no acute ischemic abnormalities  Assessment/Plan Principal Problem:   Acute pancreatitis Active Problems:   Paroxysmal atrial fibrillation (HCC)   Hypothyroidism    Acute pancreatitis -Likely alcoholic in origin. -LFTs are not elevated, CT scan does not show evidence of biliary duct dilatation. -Start on IV fluids, pain management as needed, keep nothing by mouth for now next  Hypothyroidism -IV Synthroid.  Paroxysmal atrial fibrillation -Not on anticoagulation, currently rate controlled, currently in sinus rhythm.   DVT prophylaxis: Lovenox  Code Status: Full code  Family Communication: Patient only  Disposition Plan: Anticipate discharge home in 2-3 days once medically ready  Consults called: GI at patient request  Admission status: Inpatient    Time Spent: 75 minutes  Lelon Frohlich MD [facility] Pager [phone]  If 7PM-7AM, please contact night-coverage [email or web address] Password  [email or web address]  06/26/2015, 7:30 PM

## 2015-06-26 NOTE — ED Notes (Signed)
Pt taken to CT by mary and maryann.

## 2015-06-26 NOTE — ED Notes (Signed)
CRITICAL VALUE ALERT  Critical value received: lactic 3.0  Date of notification:  06/26/15  Time of notification:  0829  Critical value read back: yes  Nurse who received alert:  J.Sebastian Lurz rn  MD notified (1st page):  [email or web address]  Time of first page:  0830  MD notified (2nd page):  Time of second page:  Responding MD:  [email or web address]  Time MD responded:  [id number]

## 2015-06-26 NOTE — ED Notes (Signed)
MD at bedside. 

## 2015-06-27 DIAGNOSIS — R109 Unspecified abdominal pain: Secondary | ICD-10-CM

## 2015-06-27 DIAGNOSIS — K8500 Idiopathic acute pancreatitis without necrosis or infection: Secondary | ICD-10-CM

## 2015-06-27 DIAGNOSIS — K8502 Idiopathic acute pancreatitis with infected necrosis: Secondary | ICD-10-CM

## 2015-06-27 DIAGNOSIS — R0602 Shortness of breath: Secondary | ICD-10-CM

## 2015-06-27 DIAGNOSIS — K852 Alcohol induced acute pancreatitis without necrosis or infection: Secondary | ICD-10-CM

## 2015-06-27 DIAGNOSIS — R7989 Other specified abnormal findings of blood chemistry: Secondary | ICD-10-CM

## 2015-06-27 DIAGNOSIS — R0600 Dyspnea, unspecified: Secondary | ICD-10-CM

## 2015-06-27 LAB — COMPREHENSIVE METABOLIC PANEL
ALBUMIN: 3.3 g/dL — AB (ref 3.5–5.0)
ALK PHOS: 33 U/L — AB (ref 38–126)
ALT: 34 U/L (ref 17–63)
ANION GAP: 4 — AB (ref 5–15)
AST: 31 U/L (ref 15–41)
BILIRUBIN TOTAL: 1.7 mg/dL — AB (ref 0.3–1.2)
BUN: 19 mg/dL (ref 6–20)
CO2: 26 mmol/L (ref 22–32)
Calcium: 7.9 mg/dL — ABNORMAL LOW (ref 8.9–10.3)
Chloride: 109 mmol/L (ref 101–111)
Creatinine, Ser: 0.77 mg/dL (ref 0.61–1.24)
GFR calc Af Amer: >60 mL/min (ref >60)
GFR calc non Af Amer: >60 mL/min (ref >60)
GLUCOSE: 122 mg/dL — AB (ref 65–99)
POTASSIUM: 4.1 mmol/L (ref 3.5–5.1)
SODIUM: 139 mmol/L (ref 135–145)
TOTAL PROTEIN: 5.9 g/dL — AB (ref 6.5–8.1)

## 2015-06-27 LAB — LIPASE, BLOOD: LIPASE: 304 U/L — AB (ref 11–51)

## 2015-06-27 LAB — CBC
HCT: 42.3 % (ref 39.0–52.0)
Hemoglobin: 13.8 g/dL (ref 13.0–17.0)
MCH: 31.4 pg (ref 26.0–34.0)
MCHC: 32.6 g/dL (ref 30.0–36.0)
MCV: 96.4 fL (ref 78.0–100.0)
PLATELETS: 163 10*3/uL (ref 150–400)
RBC: 4.39 MIL/uL (ref 4.22–5.81)
RDW: 13.9 % (ref 11.5–15.5)
WBC: 11.3 10*3/uL — ABNORMAL HIGH (ref 4.0–10.5)

## 2015-06-27 MED ORDER — LORAZEPAM 2 MG/ML IJ SOLN
0.0000 mg | Freq: Two times a day (BID) | INTRAMUSCULAR | Status: DC
Start: 2015-06-29 — End: 2015-06-30
  Administered 2015-06-30: 1 mg via INTRAVENOUS
  Filled 2015-06-27 (×3): qty 1

## 2015-06-27 MED ORDER — THIAMINE HCL 100 MG/ML IJ SOLN
100.0000 mg | Freq: Every day | INTRAMUSCULAR | Status: DC
  Administered 2015-06-28: 100 mg via INTRAVENOUS
  Filled 2015-06-27 (×2): qty 2

## 2015-06-27 MED ORDER — LORAZEPAM 2 MG/ML IJ SOLN
0.0000 mg | Freq: Four times a day (QID) | INTRAMUSCULAR | Status: AC
  Administered 2015-06-29: 1 mg via INTRAVENOUS

## 2015-06-27 MED ORDER — VITAMIN B-1 100 MG PO TABS
100.0000 mg | ORAL_TABLET | Freq: Every day | ORAL | Status: DC
  Administered 2015-06-28 – 2015-07-03 (×6): 100 mg via ORAL
  Filled 2015-06-27 (×6): qty 1

## 2015-06-27 MED ORDER — MORPHINE SULFATE (PF) 4 MG/ML IV SOLN
INTRAVENOUS | Status: AC
  Administered 2015-06-27: 4 mg
  Filled 2015-06-27: qty 1

## 2015-06-27 MED ORDER — NALOXONE HCL 0.4 MG/ML IJ SOLN: 0.4000 mg | INTRAMUSCULAR | Status: DC | PRN

## 2015-06-27 MED ORDER — DIPHENHYDRAMINE HCL 12.5 MG/5ML PO ELIX: 12.5000 mg | ORAL_SOLUTION | Freq: Four times a day (QID) | ORAL | Status: DC | PRN

## 2015-06-27 MED ORDER — ONDANSETRON HCL 4 MG/2ML IJ SOLN
4.0000 mg | Freq: Four times a day (QID) | INTRAMUSCULAR | Status: DC | PRN
Start: 2015-06-27 — End: 2015-06-28

## 2015-06-27 MED ORDER — SODIUM CHLORIDE 0.9% FLUSH: 9.0000 mL | INTRAVENOUS | Status: DC | PRN

## 2015-06-27 MED ORDER — LORAZEPAM 2 MG/ML IJ SOLN
1.0000 mg | Freq: Four times a day (QID) | INTRAMUSCULAR | Status: AC | PRN
  Administered 2015-06-29 – 2015-06-30 (×2): 1 mg via INTRAVENOUS
  Filled 2015-06-27 (×2): qty 1

## 2015-06-27 MED ORDER — MORPHINE SULFATE (PF) 2 MG/ML IV SOLN
2.0000 mg | INTRAVENOUS | Status: DC | PRN
  Administered 2015-06-27 (×3): 2 mg via INTRAVENOUS
  Filled 2015-06-27 (×3): qty 1

## 2015-06-27 MED ORDER — DIPHENHYDRAMINE HCL 50 MG/ML IJ SOLN: 12.5000 mg | Freq: Four times a day (QID) | INTRAMUSCULAR | Status: DC | PRN

## 2015-06-27 MED ORDER — MORPHINE SULFATE 2 MG/ML IV SOLN
INTRAVENOUS | Status: DC
  Administered 2015-06-27: 15:00:00 via INTRAVENOUS
  Administered 2015-06-27: 19.5 mg via INTRAVENOUS
  Administered 2015-06-28: 08:00:00 via INTRAVENOUS
  Administered 2015-06-28: 4.5 mg via INTRAVENOUS
  Administered 2015-06-28: 9 mg via INTRAVENOUS
  Filled 2015-06-27 (×2): qty 25

## 2015-06-27 MED ORDER — ADULT MULTIVITAMIN W/MINERALS CH
1.0000 | ORAL_TABLET | Freq: Every day | ORAL | Status: DC
  Administered 2015-06-28 – 2015-07-03 (×6): 1 via ORAL
  Filled 2015-06-27 (×6): qty 1

## 2015-06-27 MED ORDER — FOLIC ACID 1 MG PO TABS
1.0000 mg | ORAL_TABLET | Freq: Every day | ORAL | Status: DC
  Administered 2015-06-28 – 2015-07-03 (×6): 1 mg via ORAL
  Filled 2015-06-27 (×6): qty 1

## 2015-06-27 MED ORDER — LORAZEPAM 1 MG PO TABS
1.0000 mg | ORAL_TABLET | Freq: Four times a day (QID) | ORAL | Status: AC | PRN
  Administered 2015-06-28: 1 mg via ORAL
  Filled 2015-06-27: qty 1

## 2015-06-27 NOTE — Progress Notes (Signed)
  Subjective:  Patient states he suffered with pain all night long. He states staff was not responding to his calls. He states pain is no worse or better than yesterday. He denies chest pain or shortness of breath. He has not had a bowel movement and is not passing flatus. He did vomit once last night.  Objective: Blood pressure 119/70, pulse 85, temperature 98.4 F (36.9 C), temperature source Oral, resp. rate 20, height 5\' 11"  (1.803 m), weight 230 lb 6.4 oz (104.509 kg), SpO2 94 %. Patient is is alert and is uncomfortable. Abdomen is full. Bowel sounds are normal. Abdomen is soft with moderate tenderness across upper abdomen with guarding. Mild tenderness also noted at RLQ. No LE edema or clubbing noted.   Urine output 1.5 L last 16 hours.  Labs/studies Results:   Recent Labs  06/26/15 0730 06/27/15 0553  WBC 8.0 11.3*  HGB 14.1 13.8  HCT 41.1 42.3  PLT 166 163    BMET   Recent Labs  06/26/15 0730 06/27/15 0553  NA 140 139  K 3.4* 4.1  CL 105 109  CO2 26 26  GLUCOSE 135* 122*  BUN 18 19  CREATININE 0.94 0.77  CALCIUM 10.2 7.9*    LFT   Recent Labs  06/26/15 0730 06/27/15 0553  PROT 7.1 5.9*  ALBUMIN 4.1 3.3*  AST 38 31  ALT 29 34  ALKPHOS 45 33*  BILITOT 0.7 1.7*    Lipase 304.  Assessment:  #1.Alcoholic pancreatitis. Renal function is well-preserved and serum calcium has dropped but corrected calcium is 8.5. Pain control is poor. He may benefit from PCA analgesia.                                                                 Recommendations:  DC Fentanyl as it is not working. Morphine sulfate 2 mg every hour when necessary. Consider PCA. Will discuss with Dr. Deniece Ree. Repeat lab in a.m.

## 2015-06-27 NOTE — Progress Notes (Signed)
PROGRESS NOTE    Andrew Watson  [id number] DOB: 1940-10-31 DOA: 06/26/2015 PCP: Wende Neighbors, MD     Brief Narrative:  75 year old man admitted to the hospital on 5/31 with complaints of abdominal pain. Was found to have acute pancreatitis with a lipase greater than 3000 and no signs of necrosis on his CT scan. GI, Dr. Laural Golden has been consulted at patient's request.   Assessment & Plan:   Principal Problem:   Acute pancreatitis Active Problems:   Paroxysmal atrial fibrillation ([place])   Hypothyroidism   Acute pancreatitis -Had significant pain overnight, was transitioned over to morphine 2 mg every 1-2 hours this morning, patient would prefer to have PCA so he can administer pain meds in his own timeframe as he had a bad experience last night where he relates he tried to get pain meds but was not attended to in what he believes was a timely fashion. -He continues to be in pain today, will agree to a morphine PCA. Insulin-given pain we'll continue nothing by mouth state, continue IV fluids.  Alcohol abuse  -thiamine/folate. -Monitor on CIWA protocol for withdrawals. If he is going to start will try will start doing this within the next 1-2 days.  Hypothyroidism -Continue Synthroid IV.  Atrial fibrillation -Rate controlled, currently in sinus rhythm, not anticoagulated   DVT prophylaxis: Lovenox Code Status: Full code Family Communication: Wife at bedside updated on plan of care and all questions answered Disposition Plan: To be determined  Consultants:   GI  Procedures:   None  Antimicrobials:   None    Subjective: Had pain overnight, states that staff was not responding to his calls in what he believes is a timely fashion. Wants PCA pump if possible.  Objective: Filed Vitals:   06/26/15 1528 06/26/15 2137 06/27/15 0539 06/27/15 1458  BP: 146/75 135/71 119/70   Pulse: 78 79 85   Temp: 97.8 F (36.6 C) 97.9 F (36.6 C) 98.4 F (36.9 C)   TempSrc:  Oral Oral Oral   Resp: 16 18 20 18   Height: 5\' 11"  (1.803 m)     Weight: 104.509 kg (230 lb 6.4 oz)     SpO2: 98% 97% 94% 95%    Intake/Output Summary (Last 24 hours) at 06/27/15 1553 Last data filed at 06/27/15 1342  Gross per 24 hour  Intake 243.33 ml  Output   1000 ml  Net -756.67 ml   Filed Weights   06/26/15 0720 06/26/15 1528  Weight: 104.327 kg (230 lb) 104.509 kg (230 lb 6.4 oz)    Examination:  General exam: Alert, awake, oriented x 3;Occasionally nods off during conversation but is able to repeat what we have discussed without incident. Respiratory system: Clear to auscultation. Respiratory effort normal. Cardiovascular system:RRR. No murmurs, rubs, gallops. Gastrointestinal system: Abdomen is distended, tender to palpation to the epigastric and left upper quadrant, hypoactive bowel sounds.  Central nervous system: Alert and oriented. No focal neurological deficits. Extremities: No C/C/E, +pedal pulses Skin: No rashes, lesions or ulcers Psychiatry: Judgement and insight appear normal. Mood & affect appropriate.     Data Reviewed: I have personally reviewed following labs and imaging studies  CBC:  Recent Labs Lab 06/26/15 0730 06/27/15 0553  WBC 8.0 11.3*  HGB 14.1 13.8  HCT 41.1 42.3  MCV 94.1 96.4  PLT 166 [id number]   Basic Metabolic Panel:  Recent Labs Lab 06/26/15 0730 06/27/15 0553  NA 140 139  K 3.4* 4.1  CL 105 109  CO2 26  26  GLUCOSE 135* 122*  BUN 18 19  CREATININE 0.94 0.77  CALCIUM 10.2 7.9*   GFR: Estimated Creatinine Clearance: 98.2 mL/min (by C-G formula based on Cr of 0.77). Liver Function Tests:  Recent Labs Lab 06/26/15 0730 06/27/15 0553  AST 38 31  ALT 29 34  ALKPHOS 45 33*  BILITOT 0.7 1.7*  PROT 7.1 5.9*  ALBUMIN 4.1 3.3*    Recent Labs Lab 06/26/15 0730 06/27/15 0553  LIPASE >3000* 304*   No results for input(s): AMMONIA in the last 168 hours. Coagulation Profile: No results for input(s): INR, PROTIME in  the last 168 hours. Cardiac Enzymes:  Recent Labs Lab 06/26/15 0730  TROPONINI <0.03   BNP (last 3 results) No results for input(s): PROBNP in the last 8760 hours. HbA1C: No results for input(s): HGBA1C in the last 72 hours. CBG: No results for input(s): GLUCAP in the last 168 hours. Lipid Profile: No results for input(s): CHOL, HDL, LDLCALC, TRIG, CHOLHDL, LDLDIRECT in the last 72 hours. Thyroid Function Tests: No results for input(s): TSH, T4TOTAL, FREET4, T3FREE, THYROIDAB in the last 72 hours. Anemia Panel: No results for input(s): VITAMINB12, FOLATE, FERRITIN, TIBC, IRON, RETICCTPCT in the last 72 hours. Urine analysis:    Component Value Date/Time   COLORURINE YELLOW 06/26/2015 [place] 06/26/2015 0909   LABSPEC 1.020 06/26/2015 0909   PHURINE 5.5 06/26/2015 0909   GLUCOSEU NEGATIVE 06/26/2015 0909   HGBUR TRACE* 06/26/2015 0909   BILIRUBINUR NEGATIVE 06/26/2015 0909   KETONESUR NEGATIVE 06/26/2015 0909   PROTEINUR NEGATIVE 06/26/2015 0909   NITRITE NEGATIVE 06/26/2015 0909   LEUKOCYTESUR NEGATIVE 06/26/2015 0909   Sepsis Labs: @LABRCNTIP (procalcitonin:4,lacticidven:4)  )No results found for this or any previous visit (from the past 240 hour(s)).       Radiology Studies: Dg Chest 2 View  06/26/2015  CLINICAL DATA:  Epigastric pain EXAM: CHEST  2 VIEW COMPARISON:  05/08/2015 FINDINGS: The heart size and mediastinal contours are within normal limits. Both lungs are clear. The visualized skeletal structures are unremarkable. IMPRESSION: No active cardiopulmonary disease. Electronically Signed   By: Inez Catalina M.D.   On: 06/26/2015 08:01   Ct Cta Abd/pel W/cm &/or W/o Cm  06/26/2015  CLINICAL DATA:  Severe abdominal pain for several hours, initial encounter EXAM: CTA ABDOMEN AND PELVIS wITHOUT AND WITH CONTRAST TECHNIQUE: Multidetector CT imaging of the abdomen and pelvis was performed using the standard protocol during bolus administration of  intravenous contrast. Multiplanar reconstructed images and MIPs were obtained and reviewed to evaluate the vascular anatomy. CONTRAST:  100 mL Isovue 370. COMPARISON:  None. FINDINGS: Lung bases are free of acute infiltrate or sizable effusion. The liver, gallbladder, spleen, adrenal glands and kidneys are within normal limits with the exception of a single nonobstructing left renal stone in the lower pole. It measures approximately 8 mm. No obstructive changes are seen. The pancreas is well visualized and peripancreatic inflammatory changes are noted surrounding the head and uncinate process. These also developed the duodenum. No focal pseudocyst is seen. No significant phlegmon is noted. The abdominal aorta and its branches are within normal limits. No aneurysmal dilatation is seen. No significant mesenteric ischemia is seen. The bladder is well distended. No pelvic mass lesion is seen. The appendix is partially visualized. No inflammatory changes are seen. The osseous structures show degenerative change of the lumbar spine. No acute bony abnormality is seen. Review of the MIP images confirms the above findings. IMPRESSION: Nonobstructing left lower pole renal calculus.  Inflammatory changes in the region of the pancreatic head and uncinate process also developing the duodenum. These changes are consistent with acute pancreatitis. Correlation with laboratory values is recommended. No other focal abnormality is seen. Electronically Signed   By: Inez Catalina M.D.   On: 06/26/2015 09:37        Scheduled Meds: . enoxaparin (LOVENOX) injection  40 mg Subcutaneous Q24H  . folic acid  1 mg Oral Daily  . levothyroxine  75 mcg Intravenous Daily  . LORazepam  0-4 mg Intravenous Q6H   Followed by  . [START ON 06/29/2015] LORazepam  0-4 mg Intravenous Q12H  . morphine   Intravenous Q4H  . multivitamin with minerals  1 tablet Oral Daily  . thiamine  100 mg Oral Daily   Or  . thiamine  100 mg Intravenous Daily    Continuous Infusions: . sodium chloride 150 mL/hr at 06/27/15 1218     LOS: 1 day    Time spent: 25 minutes. Greater than 50% of this time was spent in direct contact with the patient coordinating care.     Lelon Frohlich, MD [facility] Pager [phone]  If 7PM-7AM, please contact night-coverage [email or web address] Password [facility] 06/27/2015, 3:53 PM

## 2015-06-27 NOTE — Care Management Note (Signed)
Case Management Note  Patient Details  Name: Andrew Watson MRN: [id number] Date of Birth: Sep 15, 1940  Subjective/Objective:  Spoke with patient who is alert and oriented from home Independent.  Stated that he can afford medications and is able to make thier medical appointments without difficulty. No DME or O2 at home. PCP is dr Nevada Crane No CM needs identified.                Action/Plan:Home with self care.   Expected Discharge Date:                  Expected Discharge Plan:  Home/Self Care  In-House Referral:     Discharge planning Services  CM Consult  Post Acute Care Choice:    Choice offered to:     DME Arranged:    DME Agency:     HH Arranged:    [place] Agency:     Status of Service:  Completed, signed off  Medicare Important Message Given:    Date Medicare IM Given:    Medicare IM give by:    Date Additional Medicare IM Given:    Additional Medicare Important Message give by:     If discussed at [place] of Stay Meetings, dates discussed:    Additional Comments:  Alvie Heidelberg, RN 06/27/2015, 4:12 PM

## 2015-06-28 DIAGNOSIS — K851 Biliary acute pancreatitis without necrosis or infection: Secondary | ICD-10-CM

## 2015-06-28 LAB — COMPREHENSIVE METABOLIC PANEL
ALBUMIN: 3.1 g/dL — AB (ref 3.5–5.0)
ALT: 33 U/L (ref 17–63)
ANION GAP: 5 (ref 5–15)
AST: 34 U/L (ref 15–41)
Alkaline Phosphatase: 34 U/L — ABNORMAL LOW (ref 38–126)
BUN: 27 mg/dL — ABNORMAL HIGH (ref 6–20)
CHLORIDE: 112 mmol/L — AB (ref 101–111)
CO2: 23 mmol/L (ref 22–32)
CREATININE: 0.82 mg/dL (ref 0.61–1.24)
Calcium: 7.1 mg/dL — ABNORMAL LOW (ref 8.9–10.3)
GFR calc non Af Amer: >60 mL/min (ref >60)
Glucose, Bld: 115 mg/dL — ABNORMAL HIGH (ref 65–99)
Potassium: 3.6 mmol/L (ref 3.5–5.1)
SODIUM: 140 mmol/L (ref 135–145)
Total Bilirubin: 4.5 mg/dL — ABNORMAL HIGH (ref 0.3–1.2)
Total Protein: 6.1 g/dL — ABNORMAL LOW (ref 6.5–8.1)

## 2015-06-28 LAB — CBC
HCT: 38.9 % — ABNORMAL LOW (ref 39.0–52.0)
HEMOGLOBIN: 12.5 g/dL — AB (ref 13.0–17.0)
MCH: 31.6 pg (ref 26.0–34.0)
MCHC: 32.1 g/dL (ref 30.0–36.0)
MCV: 98.2 fL (ref 78.0–100.0)
Platelets: 138 10*3/uL — ABNORMAL LOW (ref 150–400)
RBC: 3.96 MIL/uL — AB (ref 4.22–5.81)
RDW: 14.4 % (ref 11.5–15.5)
WBC: 10.2 10*3/uL (ref 4.0–10.5)

## 2015-06-28 LAB — BILIRUBIN, FRACTIONATED(TOT/DIR/INDIR)
BILIRUBIN INDIRECT: 2.9 mg/dL — AB (ref 0.3–0.9)
Bilirubin, Direct: 1.9 mg/dL — ABNORMAL HIGH (ref 0.1–0.5)
Total Bilirubin: 4.8 mg/dL — ABNORMAL HIGH (ref 0.3–1.2)

## 2015-06-28 LAB — LIPASE, BLOOD: Lipase: 101 U/L — ABNORMAL HIGH (ref 11–51)

## 2015-06-28 MED ORDER — FUROSEMIDE 10 MG/ML IJ SOLN
40.0000 mg | Freq: Once | INTRAMUSCULAR | Status: AC
  Administered 2015-06-28: 40 mg via INTRAVENOUS
  Filled 2015-06-28: qty 4

## 2015-06-28 MED ORDER — OXYCODONE HCL 5 MG PO TABS
5.0000 mg | ORAL_TABLET | ORAL | Status: DC | PRN
  Administered 2015-06-28 – 2015-06-30 (×8): 10 mg via ORAL
  Administered 2015-07-01: 5 mg via ORAL
  Administered 2015-07-01 – 2015-07-03 (×10): 10 mg via ORAL
  Filled 2015-06-28 (×7): qty 2
  Filled 2015-06-28: qty 1
  Filled 2015-06-28 (×4): qty 2
  Filled 2015-06-28: qty 1
  Filled 2015-06-28 (×4): qty 2
  Filled 2015-06-28: qty 1
  Filled 2015-06-28 (×3): qty 2
  Filled 2015-06-28: qty 1
  Filled 2015-06-28: qty 2

## 2015-06-28 MED ORDER — MORPHINE SULFATE (PF) 2 MG/ML IV SOLN
2.0000 mg | INTRAVENOUS | Status: DC | PRN
  Administered 2015-06-29 – 2015-07-06 (×19): 2 mg via INTRAVENOUS
  Filled 2015-06-28 (×24): qty 1

## 2015-06-28 NOTE — Progress Notes (Signed)
Patient states he is feeling better. He is not experiencing more pain with clear liquids. He is now off PCA. Lab results noted. Bilirubin 4.8 and direct 1.9 with an indirect of 2.9. Patient has bibasilar rales and rhonchi. He possibly has Gilbert,s syndrome. His bilirubin was normal on admission. With pancreatitis he is at risk for CBD obstruction.  Will give patient 40 mg of furosemide IV Repeat LFTs in a.m.

## 2015-06-28 NOTE — Progress Notes (Signed)
Pt. Passing large amount of flatus. States "I'm feeling a little better." Up to chair for 1.5 hours. Tolerated well. Pt. Back to bed at present.

## 2015-06-28 NOTE — Progress Notes (Signed)
PCA pump d /c as ordered. Will medicate with po pain meds as ordered. Pt. Eating jello. Tolerating well.

## 2015-06-28 NOTE — Progress Notes (Signed)
PROGRESS NOTE    Andrew Watson  [id number] DOB: 1940/10/28 DOA: 06/26/2015 PCP: Wende Neighbors, MD     Brief Narrative:  75 year old man admitted to the hospital on 5/31 with complaints of abdominal pain. Was found to have acute pancreatitis with a lipase greater than 3000 and no signs of necrosis on his CT scan. GI, Dr. Laural Golden has been consulted at patient's request.   Assessment & Plan:   Principal Problem:   Acute pancreatitis Active Problems:   Paroxysmal atrial fibrillation ([place])   Hypothyroidism   Acute pancreatitis -He feels better, pain is improved, would like diet advanced. -We'll take off PCA and start oral pain medication.  Alcohol abuse  -thiamine/folate. -Monitor on CIWA protocol for withdrawals.   Hypothyroidism -Continue Synthroid IV.  Atrial fibrillation -Rate controlled, currently in sinus rhythm, not anticoagulated   DVT prophylaxis: Lovenox Code Status: Full code Family Communication: Wife at bedside updated on plan of care and all questions answered Disposition Plan: To be determined  Consultants:   GI  Procedures:   None  Antimicrobials:   None    Subjective: Feels better, pain improved, wants diet advanced.  Objective: Filed Vitals:   06/28/15 0400 06/28/15 0500 06/28/15 0748 06/28/15 1305  BP:  104/62  101/61  Pulse:  104  97  Temp:  98 F (36.7 C)  98.1 F (36.7 C)  TempSrc:  Oral  Oral  Resp: 12 28 22 20   Height:      Weight:      SpO2: 97% 96% 26% 95%    Intake/Output Summary (Last 24 hours) at 06/28/15 1654 Last data filed at 06/28/15 1200  Gross per 24 hour  Intake    120 ml  Output    350 ml  Net   -230 ml   Filed Weights   06/26/15 0720 06/26/15 1528  Weight: 104.327 kg (230 lb) 104.509 kg (230 lb 6.4 oz)    Examination:  General exam: Alert, awake, oriented x 3;Occasionally nods off during conversation but is able to repeat what we have discussed without incident. Respiratory system: Clear to  auscultation. Respiratory effort normal. Cardiovascular system:RRR. No murmurs, rubs, gallops. Gastrointestinal system: Abdomen is distended, tender to palpation to the epigastric and left upper quadrant, hypoactive bowel sounds.  Central nervous system: Alert and oriented. No focal neurological deficits. Extremities: No C/C/E, +pedal pulses Skin: No rashes, lesions or ulcers Psychiatry: Judgement and insight appear normal. Mood & affect appropriate.     Data Reviewed: I have personally reviewed following labs and imaging studies  CBC:  Recent Labs Lab 06/26/15 0730 06/27/15 0553 06/28/15 0634  WBC 8.0 11.3* 10.2  HGB 14.1 13.8 12.5*  HCT 41.1 42.3 38.9*  MCV 94.1 96.4 98.2  PLT 166 163 0000000*   Basic Metabolic Panel:  Recent Labs Lab 06/26/15 0730 06/27/15 0553 06/28/15 0634  NA 140 139 140  K 3.4* 4.1 3.6  CL 105 109 112*  CO2 26 26 23   GLUCOSE 135* 122* 115*  BUN 18 19 27*  CREATININE 0.94 0.77 0.82  CALCIUM 10.2 7.9* 7.1*   GFR: Estimated Creatinine Clearance: 95.8 mL/min (by C-G formula based on Cr of 0.82). Liver Function Tests:  Recent Labs Lab 06/26/15 0730 06/27/15 0553 06/28/15 0634 06/28/15 0928  AST 38 31 34  --   ALT 29 34 33  --   ALKPHOS 45 33* 34*  --   BILITOT 0.7 1.7* 4.5* 4.8*  PROT 7.1 5.9* 6.1*  --   ALBUMIN 4.1  3.3* 3.1*  --     Recent Labs Lab 06/26/15 0730 06/27/15 0553 06/28/15 0634  LIPASE >3000* 304* 101*   No results for input(s): AMMONIA in the last 168 hours. Coagulation Profile: No results for input(s): INR, PROTIME in the last 168 hours. Cardiac Enzymes:  Recent Labs Lab 06/26/15 0730  TROPONINI <0.03   BNP (last 3 results) No results for input(s): PROBNP in the last 8760 hours. HbA1C: No results for input(s): HGBA1C in the last 72 hours. CBG: No results for input(s): GLUCAP in the last 168 hours. Lipid Profile: No results for input(s): CHOL, HDL, LDLCALC, TRIG, CHOLHDL, LDLDIRECT in the last 72  hours. Thyroid Function Tests: No results for input(s): TSH, T4TOTAL, FREET4, T3FREE, THYROIDAB in the last 72 hours. Anemia Panel: No results for input(s): VITAMINB12, FOLATE, FERRITIN, TIBC, IRON, RETICCTPCT in the last 72 hours. Urine analysis:    Component Value Date/Time   COLORURINE YELLOW 06/26/2015 [place] 06/26/2015 0909   LABSPEC 1.020 06/26/2015 0909   PHURINE 5.5 06/26/2015 0909   GLUCOSEU NEGATIVE 06/26/2015 0909   HGBUR TRACE* 06/26/2015 0909   BILIRUBINUR NEGATIVE 06/26/2015 0909   KETONESUR NEGATIVE 06/26/2015 0909   PROTEINUR NEGATIVE 06/26/2015 0909   NITRITE NEGATIVE 06/26/2015 0909   LEUKOCYTESUR NEGATIVE 06/26/2015 0909   Sepsis Labs: @LABRCNTIP (procalcitonin:4,lacticidven:4)  )No results found for this or any previous visit (from the past 240 hour(s)).       Radiology Studies: No results found.      Scheduled Meds: . enoxaparin (LOVENOX) injection  40 mg Subcutaneous Q24H  . folic acid  1 mg Oral Daily  . furosemide  40 mg Intravenous Once  . levothyroxine  75 mcg Intravenous Daily  . LORazepam  0-4 mg Intravenous Q6H   Followed by  . [START ON 06/29/2015] LORazepam  0-4 mg Intravenous Q12H  . multivitamin with minerals  1 tablet Oral Daily  . thiamine  100 mg Oral Daily   Or  . thiamine  100 mg Intravenous Daily   Continuous Infusions: . sodium chloride 150 mL/hr at 06/27/15 2154     LOS: 2 days    Time spent: 25 minutes. Greater than 50% of this time was spent in direct contact with the patient coordinating care.     Lelon Frohlich, MD [facility] Pager [phone]  If 7PM-7AM, please contact night-coverage [email or web address] Password [facility] 06/28/2015, 4:54 PM

## 2015-06-28 NOTE — Care Management Important Message (Signed)
Important Message  Patient Details  Name: Andrew Watson MRN: [id number] Date of Birth: Jun 06, 1940   Medicare Important Message Given:  Yes    Alvie Heidelberg, RN 06/28/2015, 9:34 AM

## 2015-06-28 NOTE — Progress Notes (Signed)
  Subjective:  Patient states he had good night. He rates his pain pain at 4. He denies nausea or vomiting. He does not have an appetite but he wants to drink liquids. He has not had a bowel movement or passed flatus since admission. He denies shortness of breath.   Objective: Blood pressure 104/62, pulse 104, temperature 98 F (36.7 C), temperature source Oral, resp. rate 22, height 5\' 11"  (1.803 m), weight 230 lb 6.4 oz (104.509 kg), SpO2 26 %. Patient is drowsy but able to answer questions appropriately. Conjunctiva is pink. Sclera is nonicteric Oropharyngeal mucosa is normal. No neck masses or thyromegaly noted. Cardiac exam with regular rhythm normal S1 and S2. Faint SEM at left sternal border. Auscultation of lungs revealed few basilar crackles. Abdomen is full. Bowel sounds are normal. On palpation abdomen is soft with mild midepigastric and right low quadrant tenderness. Percussion noticed tympanitic. No LE edema or clubbing noted.  Urine output 900 mL last 24 hours  Labs/studies Results:   Recent Labs  06/26/15 0730 06/27/15 0553 06/28/15 0634  WBC 8.0 11.3* 10.2  HGB 14.1 13.8 12.5*  HCT 41.1 42.3 38.9*  PLT 166 163 138*    BMET   Recent Labs  06/26/15 0730 06/27/15 0553 06/28/15 0634  NA 140 139 140  K 3.4* 4.1 3.6  CL 105 109 112*  CO2 26 26 23   GLUCOSE 135* 122* 115*  BUN 18 19 27*  CREATININE 0.94 0.77 0.82  CALCIUM 10.2 7.9* 7.1*    LFT   Recent Labs  06/26/15 0730 06/27/15 0553 06/28/15 0634  PROT 7.1 5.9* 6.1*  ALBUMIN 4.1 3.3* 3.1*  AST 38 31 34  ALT 29 34 33  ALKPHOS 45 33* 34*  BILITOT 0.7 1.7* 4.5*    Serum lipase 101.  Assessment:  #1.Alcoholic pancreatitis.Patient appears to be doing better. Pain control is satisfactory. Abdominal exam also reveals less tenderness. I believe urine output is not accurate. He is not ready for oral feeding yet. #2. Hyperbilirubinemia. Transaminases are normal.. Doubt that he is having hemolysis.  He possibly has Gilbert's syndrome.  Recommendations:  Fractionate bilirubin. Repeat lab in a.m.

## 2015-06-29 ENCOUNTER — Inpatient Hospital Stay (HOSPITAL_COMMUNITY): Payer: Medicare Other

## 2015-06-29 DIAGNOSIS — R0600 Dyspnea, unspecified: Secondary | ICD-10-CM

## 2015-06-29 DIAGNOSIS — K852 Alcohol induced acute pancreatitis without necrosis or infection: Principal | ICD-10-CM

## 2015-06-29 LAB — BASIC METABOLIC PANEL
Anion gap: 7 (ref 5–15)
BUN: 23 mg/dL — AB (ref 6–20)
CALCIUM: 7.5 mg/dL — AB (ref 8.9–10.3)
CO2: 27 mmol/L (ref 22–32)
CREATININE: 1 mg/dL (ref 0.61–1.24)
Chloride: 105 mmol/L (ref 101–111)
Glucose, Bld: 120 mg/dL — ABNORMAL HIGH (ref 65–99)
Potassium: 3.1 mmol/L — ABNORMAL LOW (ref 3.5–5.1)
SODIUM: 139 mmol/L (ref 135–145)

## 2015-06-29 LAB — HEPATIC FUNCTION PANEL
ALT: 31 U/L (ref 17–63)
AST: 36 U/L (ref 15–41)
Albumin: 3.2 g/dL — ABNORMAL LOW (ref 3.5–5.0)
Alkaline Phosphatase: 42 U/L (ref 38–126)
BILIRUBIN DIRECT: 0.9 mg/dL — AB (ref 0.1–0.5)
Indirect Bilirubin: 1.7 mg/dL — ABNORMAL HIGH (ref 0.3–0.9)
TOTAL PROTEIN: 6.6 g/dL (ref 6.5–8.1)
Total Bilirubin: 2.6 mg/dL — ABNORMAL HIGH (ref 0.3–1.2)

## 2015-06-29 LAB — CBC
HCT: 38.2 % — ABNORMAL LOW (ref 39.0–52.0)
Hemoglobin: 12.4 g/dL — ABNORMAL LOW (ref 13.0–17.0)
MCH: 32 pg (ref 26.0–34.0)
MCHC: 32.5 g/dL (ref 30.0–36.0)
MCV: 98.5 fL (ref 78.0–100.0)
PLATELETS: 155 10*3/uL (ref 150–400)
RBC: 3.88 MIL/uL — ABNORMAL LOW (ref 4.22–5.81)
RDW: 14.4 % (ref 11.5–15.5)
WBC: 9.3 10*3/uL (ref 4.0–10.5)

## 2015-06-29 MED ORDER — FUROSEMIDE 10 MG/ML IJ SOLN
40.0000 mg | Freq: Once | INTRAMUSCULAR | Status: AC
  Administered 2015-06-29: 40 mg via INTRAVENOUS
  Filled 2015-06-29: qty 4

## 2015-06-29 MED ORDER — LEVALBUTEROL HCL 0.63 MG/3ML IN NEBU
0.6300 mg | INHALATION_SOLUTION | Freq: Four times a day (QID) | RESPIRATORY_TRACT | Status: DC | PRN
  Administered 2015-06-29 – 2015-07-01 (×3): 0.63 mg via RESPIRATORY_TRACT
  Filled 2015-06-29 (×5): qty 3

## 2015-06-29 NOTE — Progress Notes (Signed)
Paged on-call MD once chest X-ray was complete, will follow any new orders received and continue to monitor the patient.

## 2015-06-29 NOTE — Progress Notes (Signed)
Subjective:  Spoke with patient's friend/significant other, Ms. Toney Rakes. Patient had very rough night, agitated, hallucinating. Patient just got to sleep few minutes prior to me entering the room. Given Ativan 102m IV. Patient reportedly complaining of lower abdominal pain during the night. No vomiting. Diet was downgraded to NPO. Patient also with sob and received IV lasix early this am.   Objective: Vital signs in last 24 hours: Temp:  [97.7 F (36.5 C)-100.2 F (37.9 C)] 97.7 F (36.5 C) (06/03 0526) Pulse Rate:  [97-113] 110 (06/03 0526) Resp:  [20] 20 (06/03 0526) BP: (101-152)/(61-88) 133/73 mmHg (06/03 0526) SpO2:  [94 %-96 %] 96 % (06/03 0526) Last BM Date: 06/25/15 General:   Patient sitting up in bedside chair. Sleeping. Does not awake initially with verbal or painful stimuli but does briefly open eyes with persistent physical stimuli. Does not verbally communicate however.  Head:  Normocephalic and atraumatic. Eyes:  Sclera clear, no icterus.  Chest: CTA bilaterally without rales, rhonchi, crackles.    Heart:  Regular rate and rhythm; no murmurs, clicks, rubs,  or gallops. Abdomen:  Soft, obese. Normal bowel sounds, without guarding, and without rebound.   Extremities:  Without clubbing, deformity or edema. Neurologic:  Lethargic s/o meds. Skin:  Intact without significant lesions or rashes. Psych:  Could not evaluate.  Intake/Output from previous day: 06/02 0701 - 06/03 0700 In: 480 [P.O.:480] Out: 2300 [Urine:2300] Intake/Output this shift: Total I/O In: 360 [P.O.:360] Out: 300 [Urine:300]  Lab Results: CBC  Recent Labs  06/27/15 0553 06/28/15 0634 06/29/15 0535  WBC 11.3* 10.2 9.3  HGB 13.8 12.5* 12.4*  HCT 42.3 38.9* 38.2*  MCV 96.4 98.2 98.5  PLT 163 138* 155   BMET  Recent Labs  06/27/15 0553 06/28/15 0634 06/29/15 0535  NA 139 140 139  K 4.1 3.6 3.1*  CL 109 112* 105  CO2 26 23 27   GLUCOSE 122* 115* 120*  BUN 19 27* 23*  CREATININE 0.77  0.82 1.00  CALCIUM 7.9* 7.1* 7.5*   LFTs  Recent Labs  06/27/15 0553 06/28/15 0634 06/28/15 0928 06/29/15 0535  BILITOT 1.7* 4.5* 4.8* 2.6*  BILIDIR  --   --  1.9* 0.9*  IBILI  --   --  2.9* 1.7*  ALKPHOS 33* 34*  --  42  AST 31 34  --  36  ALT 34 33  --  31  PROT 5.9* 6.1*  --  6.6  ALBUMIN 3.3* 3.1*  --  3.2*    Recent Labs  06/27/15 0553 06/28/15 0634  LIPASE 304* 101*   PT/INR No results for input(s): LABPROT, INR in the last 72 hours.    Imaging Studies: Dg Chest 2 View  06/26/2015  CLINICAL DATA:  Epigastric pain EXAM: CHEST  2 VIEW COMPARISON:  05/08/2015 FINDINGS: The heart size and mediastinal contours are within normal limits. Both lungs are clear. The visualized skeletal structures are unremarkable. IMPRESSION: No active cardiopulmonary disease. Electronically Signed   By: MInez CatalinaM.D.   On: 06/26/2015 08:01   [place]Scrotum  06/25/2015  CLINICAL DATA:  Micro hematuria, left testicular pain for 6 months with some swelling EXAM: ULTRASOUND OF SCROTUM TECHNIQUE: Complete ultrasound examination of the testicles, epididymis, and other scrotal structures was performed. COMPARISON:  None. FINDINGS: Right testicle Measurements: The right testicle measures 4.1 x 2.4 x 2.7 cm. No intratesticular abnormality is seen. Blood flow is demonstrated to the right testicle with arterial and venous waveforms. Small calcifications are present which appear benign.  Left testicle Measurements: The left testicle measures 3.7 x 2.0 x 3.0 cm. No intratesticular abnormality is noted. Blood flow is demonstrated to the left testicle with arterial and venous waveforms. Small calcifications are noted which appear benign. Right epididymis:  The right epididymis is unremarkable. Left epididymis:  The left epididymis also is unremarkable. Hydrocele:  Only a small amount of fluid is noted on the left. Varicocele:  Conceal is seen. IMPRESSION: 1. No intratesticular abnormality is seen. Blood flow is  demonstrated to both testicles. 2. Small amount of fluid on the left. Electronically Signed   By: Ivar Drape M.D.   On: 06/25/2015 12:36   [place] Art/ven Flow Abd Pelv Doppler  06/25/2015  CLINICAL DATA:  Micro hematuria, left testicular pain for 6 months with some swelling EXAM: ULTRASOUND OF SCROTUM TECHNIQUE: Complete ultrasound examination of the testicles, epididymis, and other scrotal structures was performed. COMPARISON:  None. FINDINGS: Right testicle Measurements: The right testicle measures 4.1 x 2.4 x 2.7 cm. No intratesticular abnormality is seen. Blood flow is demonstrated to the right testicle with arterial and venous waveforms. Small calcifications are present which appear benign. Left testicle Measurements: The left testicle measures 3.7 x 2.0 x 3.0 cm. No intratesticular abnormality is noted. Blood flow is demonstrated to the left testicle with arterial and venous waveforms. Small calcifications are noted which appear benign. Right epididymis:  The right epididymis is unremarkable. Left epididymis:  The left epididymis also is unremarkable. Hydrocele:  Only a small amount of fluid is noted on the left. Varicocele:  Conceal is seen. IMPRESSION: 1. No intratesticular abnormality is seen. Blood flow is demonstrated to both testicles. 2. Small amount of fluid on the left. Electronically Signed   By: Ivar Drape M.D.   On: 06/25/2015 12:36   Dg Chest Port 1 View  06/29/2015  CLINICAL DATA:  In total evaluation for sudden onset shortness of breath, wheezing. EXAM: PORTABLE CHEST 1 VIEW COMPARISON:  Prior radiograph from 06/26/2015. FINDINGS: Cardiomegaly is stable from prior. Mediastinal silhouette remains within normal limits. The current examination as been performed with a similar degree of shallow lung inflation. Mild perihilar vascular congestion without overt pulmonary edema. Probable small right pleural effusion. No definite left pleural effusion. Right basilar opacity favored to reflect  atelectasis/bronchovascular crowding/congestion. Superimposed infiltrate and/or aspiration pneumonitis could be considered in the correct clinical setting. No pneumothorax. No acute osseous abnormality. Degenerative changes about the partially visualized shoulders. Few prominent gas-filled loops of bowel noted within the upper abdomen. Curvilinear focus of air at the medial left lung base felt to lie within gas-filled bowel. IMPRESSION: 1. Stable cardiomegaly with mild perihilar vascular congestion without overt pulmonary edema. 2. Shallow lung inflation with patchy opacity at the right lung base. Finding is favored to reflect atelectasis/bronchovascular crowding and/or congestion. Possible infectious infiltrate or sequela of aspiration could be considered in the correct clinical setting. 3. Probable small right pleural effusion. Electronically Signed   By: Jeannine Boga M.D.   On: 06/29/2015 01:32   Ct Cta Abd/pel W/cm &/or W/o Cm  06/26/2015  CLINICAL DATA:  Severe abdominal pain for several hours, initial encounter EXAM: CTA ABDOMEN AND PELVIS wITHOUT AND WITH CONTRAST TECHNIQUE: Multidetector CT imaging of the abdomen and pelvis was performed using the standard protocol during bolus administration of intravenous contrast. Multiplanar reconstructed images and MIPs were obtained and reviewed to evaluate the vascular anatomy. CONTRAST:  100 mL Isovue 370. COMPARISON:  None. FINDINGS: Lung bases are free of acute infiltrate or sizable effusion.  The liver, gallbladder, spleen, adrenal glands and kidneys are within normal limits with the exception of a single nonobstructing left renal stone in the lower pole. It measures approximately 8 mm. No obstructive changes are seen. The pancreas is well visualized and peripancreatic inflammatory changes are noted surrounding the head and uncinate process. These also developed the duodenum. No focal pseudocyst is seen. No significant phlegmon is noted. The abdominal  aorta and its branches are within normal limits. No aneurysmal dilatation is seen. No significant mesenteric ischemia is seen. The bladder is well distended. No pelvic mass lesion is seen. The appendix is partially visualized. No inflammatory changes are seen. The osseous structures show degenerative change of the lumbar spine. No acute bony abnormality is seen. Review of the MIP images confirms the above findings. IMPRESSION: Nonobstructing left lower pole renal calculus. Inflammatory changes in the region of the pancreatic head and uncinate process also developing the duodenum. These changes are consistent with acute pancreatitis. Correlation with laboratory values is recommended. No other focal abnormality is seen. Electronically Signed   By: Inez Catalina M.D.   On: 06/26/2015 09:37  [2 weeks]   Assessment: 75 y/o male with possible alcoholic pancreatitis. Patient had increased abdominal pain with clear liquids and has now been made NPO. Pain reportedly was lower abd per patient's significant other, Ms. Toney Rakes. He has been having confusion and hallucinations, now resting and difficult to arouse. ?etoh withdrawal playing a role. Has received the following this morning. Oxycodone 87m at 9:39am. Morphine 252mat 10:40am. Ativan 8m40mt 12:10 pm.   Hyperbilirubinemia. Transaminases are normal. Mostly due to indirect bilirubin. Improved today. Will arrange for abdominal u/s as per Dr. RehOlevia Perchescommendation for Monday.  He possibly has Gilbert's syndrome.  Plan: 1. Supportive measures.  2. Limit sedating medications. 3. LFTs, MET-7 in AM 4. Abd u/s Monday.  LesLaureen OchsewBernarda CaffeycMarietta Advanced Surgery Centerstroenterology Associates [phone]/[id number] PM     LOS: 3 days    Addendum: Discussed with Dr. RouGala Romneyatient's BUN has been trending upward, slight tachycardia and now NPO again. He needs maintenance fluids. Initiate NS 100cc/hr. Monitor I/Os closely.   LesLaureen OchsewBernarda CaffeycSurgery Center At [facility] LLCastroenterology Associates [phone]/[id number] PM

## 2015-06-29 NOTE — Progress Notes (Signed)
PROGRESS NOTE    Andrew Watson  [id number] DOB: 21-Apr-1940 DOA: 06/26/2015 PCP: Wende Neighbors, MD     Brief Narrative:  75 year old man admitted to the hospital on 5/31 with complaints of abdominal pain. Was found to have acute pancreatitis with a lipase greater than 3000 and no signs of necrosis on his CT scan. GI, Dr. Laural Golden has been consulted at patient's request.   Assessment & Plan:   Principal Problem:   Acute pancreatitis Active Problems:   Paroxysmal atrial fibrillation (HCC)   Hypothyroidism   Dyspnea   Acute pancreatitis -After couple meals of clear liquids he has had increased abdominal pain. -We'll downgrade back to nothing by mouth for now. Monitor and potentially advance diet in a.m. -Did require some IV Lasix early this a.m. due to subjective shortness of breath, chest x-ray with pulmonary vascular congestion, fluids have been KVO.  Alcohol abuse with withdrawals -thiamine/folate. -Has received at least 1 dose of Ativan for alcohol withdrawals. -Significant other at bedside relates that he has been confused, with visual hallucinations.  Hypothyroidism -Continue Synthroid IV.  Atrial fibrillation -Rate controlled, currently in sinus rhythm, not anticoagulated   DVT prophylaxis: Lovenox Code Status: Full code Family Communication: Significant other at bedside updated on plan of care and all questions answered Disposition Plan: To be determined  Consultants:   GI  Procedures:   None  Antimicrobials:   None    Subjective: Sleepy, difficult to arouse after receiving morphine and Ativan earlier today. Discussed care with significant other at bedside who relates that he had an increase in abdominal pain following two meals of clear liquids.  Objective: Filed Vitals:   06/28/15 2058 06/29/15 0137 06/29/15 0526 06/29/15 1500  BP: 152/88  133/73 142/88  Pulse: 113  110 106  Temp: 100.2 F (37.9 C)  97.7 F (36.5 C) 98.1 F (36.7 C)    TempSrc: Oral  Oral   Resp: 20  20 20   Height:      Weight:      SpO2: 94% 96% 96% 95%    Intake/Output Summary (Last 24 hours) at 06/29/15 1524 Last data filed at 06/29/15 1100  Gross per 24 hour  Intake    720 ml  Output   2500 ml  Net  -1780 ml   Filed Weights   06/26/15 0720 06/26/15 1528  Weight: 104.327 kg (230 lb) 104.509 kg (230 lb 6.4 oz)    Examination:  General exam: Drowsy, difficult to arouse  Respiratory system: Clear to auscultation. Respiratory effort normal. Cardiovascular system:RRR. No murmurs, rubs, gallops. Gastrointestinal system: Abdomen is distended, tender to palpation to the epigastric and left upper quadrant, hypoactive bowel sounds.  Central nervous system: Alert and oriented. No focal neurological deficits. Extremities: No C/C/E, +pedal pulses Skin: No rashes, lesions or ulcers Psychiatry: Judgement and insight appear normal. Mood & affect appropriate.     Data Reviewed: I have personally reviewed following labs and imaging studies  CBC:  Recent Labs Lab 06/26/15 0730 06/27/15 0553 06/28/15 0634 06/29/15 0535  WBC 8.0 11.3* 10.2 9.3  HGB 14.1 13.8 12.5* 12.4*  HCT 41.1 42.3 38.9* 38.2*  MCV 94.1 96.4 98.2 98.5  PLT 166 163 138* [id number]   Basic Metabolic Panel:  Recent Labs Lab 06/26/15 0730 06/27/15 0553 06/28/15 0634 06/29/15 0535  NA 140 139 140 139  K 3.4* 4.1 3.6 3.1*  CL 105 109 112* 105  CO2 26 26 23 27   GLUCOSE 135* 122* 115* 120*  BUN  18 19 27* 23*  CREATININE 0.94 0.77 0.82 1.00  CALCIUM 10.2 7.9* 7.1* 7.5*   GFR: Estimated Creatinine Clearance: 78.5 mL/min (by C-G formula based on Cr of 1). Liver Function Tests:  Recent Labs Lab 06/26/15 0730 06/27/15 0553 06/28/15 0634 06/28/15 0928 06/29/15 0535  AST 38 31 34  --  36  ALT 29 34 33  --  31  ALKPHOS 45 33* 34*  --  42  BILITOT 0.7 1.7* 4.5* 4.8* 2.6*  PROT 7.1 5.9* 6.1*  --  6.6  ALBUMIN 4.1 3.3* 3.1*  --  3.2*    Recent Labs Lab 06/26/15 0730  06/27/15 0553 06/28/15 0634  LIPASE >3000* 304* 101*   No results for input(s): AMMONIA in the last 168 hours. Coagulation Profile: No results for input(s): INR, PROTIME in the last 168 hours. Cardiac Enzymes:  Recent Labs Lab 06/26/15 0730  TROPONINI <0.03   BNP (last 3 results) No results for input(s): PROBNP in the last 8760 hours. HbA1C: No results for input(s): HGBA1C in the last 72 hours. CBG: No results for input(s): GLUCAP in the last 168 hours. Lipid Profile: No results for input(s): CHOL, HDL, LDLCALC, TRIG, CHOLHDL, LDLDIRECT in the last 72 hours. Thyroid Function Tests: No results for input(s): TSH, T4TOTAL, FREET4, T3FREE, THYROIDAB in the last 72 hours. Anemia Panel: No results for input(s): VITAMINB12, FOLATE, FERRITIN, TIBC, IRON, RETICCTPCT in the last 72 hours. Urine analysis:    Component Value Date/Time   COLORURINE YELLOW 06/26/2015 0909   APPEARANCEUR CLEAR 06/26/2015 0909   LABSPEC 1.020 06/26/2015 0909   PHURINE 5.5 06/26/2015 0909   GLUCOSEU NEGATIVE 06/26/2015 0909   HGBUR TRACE* 06/26/2015 0909   BILIRUBINUR NEGATIVE 06/26/2015 0909   KETONESUR NEGATIVE 06/26/2015 0909   PROTEINUR NEGATIVE 06/26/2015 0909   NITRITE NEGATIVE 06/26/2015 0909   LEUKOCYTESUR NEGATIVE 06/26/2015 0909   Sepsis Labs: @LABRCNTIP (procalcitonin:4,lacticidven:4)  )No results found for this or any previous visit (from the past 240 hour(s)).       Radiology Studies: Dg Chest Port 1 View  06/29/2015  CLINICAL DATA:  In total evaluation for sudden onset shortness of breath, wheezing. EXAM: PORTABLE CHEST 1 VIEW COMPARISON:  Prior radiograph from 06/26/2015. FINDINGS: Cardiomegaly is stable from prior. Mediastinal silhouette remains within normal limits. The current examination as been performed with a similar degree of shallow lung inflation. Mild perihilar vascular congestion without overt pulmonary edema. Probable small right pleural effusion. No definite left pleural  effusion. Right basilar opacity favored to reflect atelectasis/bronchovascular crowding/congestion. Superimposed infiltrate and/or aspiration pneumonitis could be considered in the correct clinical setting. No pneumothorax. No acute osseous abnormality. Degenerative changes about the partially visualized shoulders. Few prominent gas-filled loops of bowel noted within the upper abdomen. Curvilinear focus of air at the medial left lung base felt to lie within gas-filled bowel. IMPRESSION: 1. Stable cardiomegaly with mild perihilar vascular congestion without overt pulmonary edema. 2. Shallow lung inflation with patchy opacity at the right lung base. Finding is favored to reflect atelectasis/bronchovascular crowding and/or congestion. Possible infectious infiltrate or sequela of aspiration could be considered in the correct clinical setting. 3. Probable small right pleural effusion. Electronically Signed   By: Jeannine Boga M.D.   On: 06/29/2015 01:32        Scheduled Meds: . enoxaparin (LOVENOX) injection  40 mg Subcutaneous Q24H  . folic acid  1 mg Oral Daily  . levothyroxine  75 mcg Intravenous Daily  . LORazepam  0-4 mg Intravenous Q12H  . multivitamin with  minerals  1 tablet Oral Daily  . thiamine  100 mg Oral Daily   Or  . thiamine  100 mg Intravenous Daily   Continuous Infusions: . sodium chloride 10 mL/hr at 06/29/15 0144     LOS: 3 days    Time spent: 25 minutes. Greater than 50% of this time was spent in direct contact with the patient coordinating care.     Lelon Frohlich, MD [facility] Pager [phone]  If 7PM-7AM, please contact night-coverage [email or web address] Password [email or web address] 06/29/2015, 3:24 PM

## 2015-06-29 NOTE — Progress Notes (Signed)
Patient is becoming SOB and wheezing, this is new, paged on call MD, will follow any orders received and continue to monitor the patient.

## 2015-06-29 NOTE — Progress Notes (Signed)
Called by RN to evaluate chest x-ray for patient having shortness of breath.  Patient seen and examined, currently on IV normal saline at 150 ml/hr for acute pancreatitis. Earlier today he received 1 dose of Lasix 40 mg IV 1 with good diuretic response. Chest x-ray shows vascular congestion  On exam Chest- bibasilar crackles  Assessment Fluid overload/pulmonary edema  Plan Change IV fluids to KVO Give 1 dose of Lasix 40 mg IV

## 2015-06-30 ENCOUNTER — Inpatient Hospital Stay (HOSPITAL_COMMUNITY): Payer: Medicare Other

## 2015-06-30 DIAGNOSIS — R7989 Other specified abnormal findings of blood chemistry: Secondary | ICD-10-CM

## 2015-06-30 DIAGNOSIS — R945 Abnormal results of liver function studies: Secondary | ICD-10-CM

## 2015-06-30 DIAGNOSIS — K8500 Idiopathic acute pancreatitis without necrosis or infection: Secondary | ICD-10-CM

## 2015-06-30 LAB — COMPREHENSIVE METABOLIC PANEL
ALBUMIN: 2.7 g/dL — AB (ref 3.5–5.0)
ALT: 28 U/L (ref 17–63)
ANION GAP: 4 — AB (ref 5–15)
AST: 29 U/L (ref 15–41)
Alkaline Phosphatase: 40 U/L (ref 38–126)
BILIRUBIN TOTAL: 1.7 mg/dL — AB (ref 0.3–1.2)
BUN: 22 mg/dL — AB (ref 6–20)
CO2: 27 mmol/L (ref 22–32)
Calcium: 7.7 mg/dL — ABNORMAL LOW (ref 8.9–10.3)
Chloride: 107 mmol/L (ref 101–111)
Creatinine, Ser: 0.72 mg/dL (ref 0.61–1.24)
GFR calc Af Amer: >60 mL/min (ref >60)
GFR calc non Af Amer: >60 mL/min (ref >60)
GLUCOSE: 102 mg/dL — AB (ref 65–99)
POTASSIUM: 3.2 mmol/L — AB (ref 3.5–5.1)
SODIUM: 138 mmol/L (ref 135–145)
TOTAL PROTEIN: 5.9 g/dL — AB (ref 6.5–8.1)

## 2015-06-30 LAB — GLUCOSE, CAPILLARY: Glucose-Capillary: 99 mg/dL (ref 65–99)

## 2015-06-30 MED ORDER — LORAZEPAM 2 MG/ML IJ SOLN
1.0000 mg | Freq: Four times a day (QID) | INTRAMUSCULAR | Status: AC | PRN
  Administered 2015-07-01: 1 mg via INTRAVENOUS
  Filled 2015-06-30: qty 1

## 2015-06-30 MED ORDER — LORAZEPAM 2 MG/ML IJ SOLN: 0.0000 mg | Freq: Two times a day (BID) | INTRAMUSCULAR | Status: DC

## 2015-06-30 MED ORDER — LORAZEPAM 1 MG PO TABS: 1.0000 mg | ORAL_TABLET | Freq: Four times a day (QID) | ORAL | Status: AC | PRN

## 2015-06-30 NOTE — Progress Notes (Signed)
PROGRESS NOTE    Andrew Watson  [id number] DOB: April 13, 1940 DOA: 06/26/2015 PCP: Wende Neighbors, MD     Brief Narrative:  75 year old man admitted to the hospital on 5/31 with complaints of abdominal pain. Was found to have acute pancreatitis with a lipase greater than 3000 and no signs of necrosis on his CT scan. GI, Dr. Laural Golden has been consulted at patient's request. He is now going through DTs   Assessment & Plan:   Principal Problem:   Acute pancreatitis Active Problems:   Paroxysmal atrial fibrillation (HCC)   Hypothyroidism   Dyspnea   Abnormal LFTs   Idiopathic acute pancreatitis without infection or necrosis   Acute pancreatitis -Seen by GI with order for increasing diet to clear liquids when patient is alert enough to tolerate.  Alcohol abuse with withdrawals -thiamine/folate. -Has been agitated, combative, trying to get out of bed. -Has received a dose of Ativan earlier today and is now lying in bed, resting comfortably  Hypothyroidism -Continue Synthroid IV.  Atrial fibrillation -Rate controlled, currently in sinus rhythm, not anticoagulated   DVT prophylaxis: Lovenox Code Status: Full code Family Communication: No family at bedside Disposition Plan: To be determined  Consultants:   GI  Procedures:   None  Antimicrobials:   None    Subjective: Sleepy, difficult to arouse after receiving morphine and Ativan earlier today.   Objective: Filed Vitals:   06/29/15 1934 06/29/15 2104 06/30/15 0538 06/30/15 0816  BP:  138/7 152/71 150/77  Pulse: 106 102 97 96  Temp:  97.7 F (36.5 C) 98.9 F (37.2 C) 98.9 F (37.2 C)  TempSrc:  Oral Oral Oral  Resp: 24 20 20 20   Height:      Weight:      SpO2: 95% 99% 100% 100%    Intake/Output Summary (Last 24 hours) at 06/30/15 1220 Last data filed at 06/30/15 0623  Gross per 24 hour  Intake 9710.5 ml  Output    600 ml  Net 9110.5 ml   Filed Weights   06/26/15 0720 06/26/15 1528    Weight: 104.327 kg (230 lb) 104.509 kg (230 lb 6.4 oz)    Examination:  General exam: Drowsy, difficult to arouse  Respiratory system: Clear to auscultation. Respiratory effort normal. Cardiovascular system:RRR. No murmurs, rubs, gallops. Gastrointestinal system: Abdomen is distended, tender to palpation to the epigastric and left upper quadrant, hypoactive bowel sounds.  Central nervous system: Alert and oriented. No focal neurological deficits. Extremities: No C/C/E, +pedal pulses Skin: No rashes, lesions or ulcers Psychiatry: Unable to assess given current mental state    Data Reviewed: I have personally reviewed following labs and imaging studies  CBC:  Recent Labs Lab 06/26/15 0730 06/27/15 0553 06/28/15 0634 06/29/15 0535  WBC 8.0 11.3* 10.2 9.3  HGB 14.1 13.8 12.5* 12.4*  HCT 41.1 42.3 38.9* 38.2*  MCV 94.1 96.4 98.2 98.5  PLT 166 163 138* [id number]   Basic Metabolic Panel:  Recent Labs Lab 06/26/15 0730 06/27/15 0553 06/28/15 0634 06/29/15 0535 06/30/15 0836  NA 140 139 140 139 138  K 3.4* 4.1 3.6 3.1* 3.2*  CL 105 109 112* 105 107  CO2 26 26 23 27 27   GLUCOSE 135* 122* 115* 120* 102*  BUN 18 19 27* 23* 22*  CREATININE 0.94 0.77 0.82 1.00 0.72  CALCIUM 10.2 7.9* 7.1* 7.5* 7.7*   GFR: Estimated Creatinine Clearance: 98.2 mL/min (by C-G formula based on Cr of 0.72). Liver Function Tests:  Recent Labs  Lab 06/26/15 0730 06/27/15 0553 06/28/15 0634 06/28/15 0928 06/29/15 0535 06/30/15 0836  AST 38 31 34  --  36 29  ALT 29 34 33  --  31 28  ALKPHOS 45 33* 34*  --  42 40  BILITOT 0.7 1.7* 4.5* 4.8* 2.6* 1.7*  PROT 7.1 5.9* 6.1*  --  6.6 5.9*  ALBUMIN 4.1 3.3* 3.1*  --  3.2* 2.7*    Recent Labs Lab 06/26/15 0730 06/27/15 0553 06/28/15 0634  LIPASE >3000* 304* 101*   No results for input(s): AMMONIA in the last 168 hours. Coagulation Profile: No results for input(s): INR, PROTIME in the last 168 hours. Cardiac Enzymes:  Recent Labs Lab  06/26/15 0730  TROPONINI <0.03   BNP (last 3 results) No results for input(s): PROBNP in the last 8760 hours. HbA1C: No results for input(s): HGBA1C in the last 72 hours. CBG: No results for input(s): GLUCAP in the last 168 hours. Lipid Profile: No results for input(s): CHOL, HDL, LDLCALC, TRIG, CHOLHDL, LDLDIRECT in the last 72 hours. Thyroid Function Tests: No results for input(s): TSH, T4TOTAL, FREET4, T3FREE, THYROIDAB in the last 72 hours. Anemia Panel: No results for input(s): VITAMINB12, FOLATE, FERRITIN, TIBC, IRON, RETICCTPCT in the last 72 hours. Urine analysis:    Component Value Date/Time   COLORURINE YELLOW 06/26/2015 0909   APPEARANCEUR CLEAR 06/26/2015 0909   LABSPEC 1.020 06/26/2015 0909   PHURINE 5.5 06/26/2015 0909   GLUCOSEU NEGATIVE 06/26/2015 0909   HGBUR TRACE* 06/26/2015 0909   BILIRUBINUR NEGATIVE 06/26/2015 0909   KETONESUR NEGATIVE 06/26/2015 0909   PROTEINUR NEGATIVE 06/26/2015 0909   NITRITE NEGATIVE 06/26/2015 0909   LEUKOCYTESUR NEGATIVE 06/26/2015 0909   Sepsis Labs: @LABRCNTIP (procalcitonin:4,lacticidven:4)  )No results found for this or any previous visit (from the past 240 hour(s)).       Radiology Studies: Dg Chest Port 1 View  06/29/2015  CLINICAL DATA:  In total evaluation for sudden onset shortness of breath, wheezing. EXAM: PORTABLE CHEST 1 VIEW COMPARISON:  Prior radiograph from 06/26/2015. FINDINGS: Cardiomegaly is stable from prior. Mediastinal silhouette remains within normal limits. The current examination as been performed with a similar degree of shallow lung inflation. Mild perihilar vascular congestion without overt pulmonary edema. Probable small right pleural effusion. No definite left pleural effusion. Right basilar opacity favored to reflect atelectasis/bronchovascular crowding/congestion. Superimposed infiltrate and/or aspiration pneumonitis could be considered in the correct clinical setting. No pneumothorax. No acute  osseous abnormality. Degenerative changes about the partially visualized shoulders. Few prominent gas-filled loops of bowel noted within the upper abdomen. Curvilinear focus of air at the medial left lung base felt to lie within gas-filled bowel. IMPRESSION: 1. Stable cardiomegaly with mild perihilar vascular congestion without overt pulmonary edema. 2. Shallow lung inflation with patchy opacity at the right lung base. Finding is favored to reflect atelectasis/bronchovascular crowding and/or congestion. Possible infectious infiltrate or sequela of aspiration could be considered in the correct clinical setting. 3. Probable small right pleural effusion. Electronically Signed   By: Jeannine Boga M.D.   On: 06/29/2015 01:32        Scheduled Meds: . enoxaparin (LOVENOX) injection  40 mg Subcutaneous Q24H  . folic acid  1 mg Oral Daily  . levothyroxine  75 mcg Intravenous Daily  . LORazepam  0-4 mg Intravenous Q12H  . multivitamin with minerals  1 tablet Oral Daily  . thiamine  100 mg Oral Daily   Or  . thiamine  100 mg Intravenous Daily   Continuous Infusions: .  sodium chloride 75 mL/hr at 06/30/15 1035     LOS: 4 days    Time spent: 25 minutes. Greater than 50% of this time was spent in direct contact with the patient coordinating care.     Lelon Frohlich, MD [facility] Pager [phone]  If 7PM-7AM, please contact night-coverage [email or web address] Password [email or web address] 06/30/2015, 12:20 PM

## 2015-06-30 NOTE — Progress Notes (Signed)
Subjective:  Remains confused and agitated. Unable to provide history. Son states he had a better day yesterday. 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  Objective: Vital signs in last 24 hours: Temp:  [97.7 F (36.5 C)-98.9 F (37.2 C)] 98.9 F (37.2 C) (06/04 0538) Pulse Rate:  [97-106] 97 (06/04 0538) Resp:  [20-24] 20 (06/04 0538) BP: (138-152)/(7-88) 152/71 mmHg (06/04 0538) SpO2:  [95 %-100 %] 100 % (06/04 0538) Last BM Date: 06/25/15 General:   Alert, chronically ill-appearing WM. Confused. States he is going to [employer]. NAD. Head:  Normocephalic and atraumatic. Eyes:  Sclera clear, no icterus.  Chest: CTA bilaterally without rales, rhonchi, crackles.  Diminished breath sounds in bases.  Heart:  Regular rate and rhythm; no murmurs, clicks, rubs,  or gallops. Abdomen:  Soft, nontender and distended.  Normal bowel sounds, without guarding, and without rebound.   Extremities:  Without clubbing, deformity or edema. Neurologic:  Alert. Ambulates from bed to chair with assistance. But Confused. . Skin:  Intact without significant lesions or rashes. Psych:  Unable to evaluate.  Intake/Output from previous day: 06/03 0701 - 06/04 0700 In: 10070.5 [P.O.:460; I.V.:9610.5] Out: 900 [Urine:900] Intake/Output this shift:    Lab Results: CBC  Recent Labs  06/28/15 0634 06/29/15 0535  WBC 10.2 9.3  HGB 12.5* 12.4*  HCT 38.9* 38.2*  MCV 98.2 98.5  PLT 138* 155   BMET  Recent Labs  06/28/15 0634 06/29/15 0535 06/30/15 0836  NA 140 139 138  K 3.6 3.1* 3.2*  CL 112* 105 107  CO2 23 27 27   GLUCOSE 115* 120* 102*  BUN 27* 23* 22*  CREATININE 0.82 1.00 0.72  CALCIUM 7.1* 7.5* 7.7*   LFTs  Recent Labs  06/28/15 0634 06/28/15 0928 06/29/15 0535 06/30/15 0836  BILITOT 4.5* 4.8* 2.6* 1.7*  BILIDIR  --  1.9* 0.9*  --   IBILI  --  2.9* 1.7*  --   ALKPHOS 34*  --  42 40  AST 34  --  36 29  ALT 33  --  31 28  PROT 6.1*  --  6.6 5.9*   ALBUMIN 3.1*  --  3.2* 2.7*    Recent Labs  06/28/15 0634  LIPASE 101*   PT/INR No results for input(s): LABPROT, INR in the last 72 hours.    Imaging Studies: Dg Chest 2 View  06/26/2015  CLINICAL DATA:  Epigastric pain EXAM: CHEST  2 VIEW COMPARISON:  05/08/2015 FINDINGS: The heart size and mediastinal contours are within normal limits. Both lungs are clear. The visualized skeletal structures are unremarkable. IMPRESSION: No active cardiopulmonary disease. Electronically Signed   By: Inez Catalina M.D.   On: 06/26/2015 08:01   US Scrotum  06/25/2015  CLINICAL DATA:  Micro hematuria, left testicular pain for 6 months with some swelling EXAM: ULTRASOUND OF SCROTUM TECHNIQUE: Complete ultrasound examination of the testicles, epididymis, and other scrotal structures was performed. COMPARISON:  None. FINDINGS: Right testicle Measurements: The right testicle measures 4.1 x 2.4 x 2.7 cm. No intratesticular abnormality is seen. Blood flow is demonstrated to the right testicle with arterial and venous waveforms. Small calcifications are present which appear benign. Left testicle Measurements: The left testicle measures 3.7 x 2.0 x 3.0 cm. No intratesticular abnormality is noted. Blood flow is demonstrated to the left testicle with arterial and venous waveforms. Small calcifications are noted which appear benign. Right epididymis:  The right epididymis is unremarkable. Left epididymis:  The left  epididymis also is unremarkable. Hydrocele:  Only a small amount of fluid is noted on the left. Varicocele:  Conceal is seen. IMPRESSION: 1. No intratesticular abnormality is seen. Blood flow is demonstrated to both testicles. 2. Small amount of fluid on the left. Electronically Signed   By: Ivar Drape M.D.   On: 06/25/2015 12:36   [place] Art/ven Flow Abd Pelv Doppler  06/25/2015  CLINICAL DATA:  Micro hematuria, left testicular pain for 6 months with some swelling EXAM: ULTRASOUND OF SCROTUM TECHNIQUE: Complete  ultrasound examination of the testicles, epididymis, and other scrotal structures was performed. COMPARISON:  None. FINDINGS: Right testicle Measurements: The right testicle measures 4.1 x 2.4 x 2.7 cm. No intratesticular abnormality is seen. Blood flow is demonstrated to the right testicle with arterial and venous waveforms. Small calcifications are present which appear benign. Left testicle Measurements: The left testicle measures 3.7 x 2.0 x 3.0 cm. No intratesticular abnormality is noted. Blood flow is demonstrated to the left testicle with arterial and venous waveforms. Small calcifications are noted which appear benign. Right epididymis:  The right epididymis is unremarkable. Left epididymis:  The left epididymis also is unremarkable. Hydrocele:  Only a small amount of fluid is noted on the left. Varicocele:  Conceal is seen. IMPRESSION: 1. No intratesticular abnormality is seen. Blood flow is demonstrated to both testicles. 2. Small amount of fluid on the left. Electronically Signed   By: Ivar Drape M.D.   On: 06/25/2015 12:36   Dg Chest Port 1 View  06/29/2015  CLINICAL DATA:  In total evaluation for sudden onset shortness of breath, wheezing. EXAM: PORTABLE CHEST 1 VIEW COMPARISON:  Prior radiograph from 06/26/2015. FINDINGS: Cardiomegaly is stable from prior. Mediastinal silhouette remains within normal limits. The current examination as been performed with a similar degree of shallow lung inflation. Mild perihilar vascular congestion without overt pulmonary edema. Probable small right pleural effusion. No definite left pleural effusion. Right basilar opacity favored to reflect atelectasis/bronchovascular crowding/congestion. Superimposed infiltrate and/or aspiration pneumonitis could be considered in the correct clinical setting. No pneumothorax. No acute osseous abnormality. Degenerative changes about the partially visualized shoulders. Few prominent gas-filled loops of bowel noted within the upper  abdomen. Curvilinear focus of air at the medial left lung base felt to lie within gas-filled bowel. IMPRESSION: 1. Stable cardiomegaly with mild perihilar vascular congestion without overt pulmonary edema. 2. Shallow lung inflation with patchy opacity at the right lung base. Finding is favored to reflect atelectasis/bronchovascular crowding and/or congestion. Possible infectious infiltrate or sequela of aspiration could be considered in the correct clinical setting. 3. Probable small right pleural effusion. Electronically Signed   By: Jeannine Boga M.D.   On: 06/29/2015 01:32   Ct Cta Abd/pel W/cm &/or W/o Cm  06/26/2015  CLINICAL DATA:  Severe abdominal pain for several hours, initial encounter EXAM: CTA ABDOMEN AND PELVIS wITHOUT AND WITH CONTRAST TECHNIQUE: Multidetector CT imaging of the abdomen and pelvis was performed using the standard protocol during bolus administration of intravenous contrast. Multiplanar reconstructed images and MIPs were obtained and reviewed to evaluate the vascular anatomy. CONTRAST:  100 mL Isovue 370. COMPARISON:  None. FINDINGS: Lung bases are free of acute infiltrate or sizable effusion. The liver, gallbladder, spleen, adrenal glands and kidneys are within normal limits with the exception of a single nonobstructing left renal stone in the lower pole. It measures approximately 8 mm. No obstructive changes are seen. The pancreas is well visualized and peripancreatic inflammatory changes are noted surrounding the head and  uncinate process. These also developed the duodenum. No focal pseudocyst is seen. No significant phlegmon is noted. The abdominal aorta and its branches are within normal limits. No aneurysmal dilatation is seen. No significant mesenteric ischemia is seen. The bladder is well distended. No pelvic mass lesion is seen. The appendix is partially visualized. No inflammatory changes are seen. The osseous structures show degenerative change of the lumbar spine.  No acute bony abnormality is seen. Review of the MIP images confirms the above findings. IMPRESSION: Nonobstructing left lower pole renal calculus. Inflammatory changes in the region of the pancreatic head and uncinate process also developing the duodenum. These changes are consistent with acute pancreatitis. Correlation with laboratory values is recommended. No other focal abnormality is seen. Electronically Signed   By: Inez Catalina M.D.   On: 06/26/2015 09:37  [2 weeks]   Assessment: 75 y/o male with possible alcoholic pancreatitis. Patient had increased abdominal pain with clear liquids Friday and was downgraded to NPO.  He has been having confusion and hallucinations and unable to provide history now. Continues to get Ativan prn and pain medication.   Hyperbilirubinemia. Transaminases are normal. Mostly due to indirect bilirubin. Continues to improve. Will arrange for abdominal u/s as per Dr. Olevia Perches recommendation for Monday. He possibly has Gilbert's syndrome.  I/Os likely inaccurately recorded. Also patient has been incontinent and urine not measured consistently given his mental state. He continues with some wheezing/labored breathing with suspected congestion on CXR yesterday. IV fluids resumed at yesterday evening given NPO status and abnormal BUN in setting of pancreatitis. Slightly improved today.    Plan: 1. Management of respiratory status per attending. 2. Abdominal u/s tomorrow for abnormal LFTs per Dr. Olevia Perches recommendations.  3. Decreased IV fluids to 75cc/hr. 4. Clear liquids ONLY WHEN ALERT. 5. Consider urinary foley catheter. 10. Son reports concerns about chronic testicular swelling. Has been evaluated recently by Dr. Nevada Crane with recent u/s. 7. Dr. Laural Golden to resume patient's care on 07/01/15.  Laureen Ochs. Bernarda Caffey [facility] [phone] 6/4/201710:33 AM     LOS: 4 days

## 2015-06-30 NOTE — Progress Notes (Addendum)
Patient's daughter came in upset and demanding that abdominal ultra sound be done today, was able to get ultrasound tech to floor to do [place] and daughter insisted that it be read immediately. Patient's daughter was informed that it would be read as soon as possible but not sure of the time. Daughter wants patient moved to [facility] stating that she is afraid the patient's organs are failing and  "no offense to yall but  you don't have the resources here to provide adequate care." MD and nursing supervisor informed and both have spoken to the daughter and informed her what was going on with her father. The nursing supervisor in room to see daughter and go over the results of the abdominal ultrasound. Family given calling card by nursing supervisor so that they may get in touch with the [facility] at anytime.

## 2015-07-01 ENCOUNTER — Inpatient Hospital Stay (HOSPITAL_COMMUNITY): Payer: Medicare Other

## 2015-07-01 LAB — CBC
HCT: 37.4 % — ABNORMAL LOW (ref 39.0–52.0)
Hemoglobin: 12.1 g/dL — ABNORMAL LOW (ref 13.0–17.0)
MCH: 31.7 pg (ref 26.0–34.0)
MCHC: 32.4 g/dL (ref 30.0–36.0)
MCV: 97.9 fL (ref 78.0–100.0)
PLATELETS: 134 10*3/uL — AB (ref 150–400)
RBC: 3.82 MIL/uL — AB (ref 4.22–5.81)
RDW: 13.4 % (ref 11.5–15.5)
WBC: 7.2 10*3/uL (ref 4.0–10.5)

## 2015-07-01 LAB — COMPREHENSIVE METABOLIC PANEL
ALBUMIN: 2.7 g/dL — AB (ref 3.5–5.0)
ALT: 36 U/L (ref 17–63)
AST: 40 U/L (ref 15–41)
Alkaline Phosphatase: 47 U/L (ref 38–126)
Anion gap: 6 (ref 5–15)
BUN: 18 mg/dL (ref 6–20)
CHLORIDE: 104 mmol/L (ref 101–111)
CO2: 29 mmol/L (ref 22–32)
CREATININE: 0.82 mg/dL (ref 0.61–1.24)
Calcium: 8 mg/dL — ABNORMAL LOW (ref 8.9–10.3)
GFR calc Af Amer: >60 mL/min (ref >60)
GFR calc non Af Amer: >60 mL/min (ref >60)
GLUCOSE: 102 mg/dL — AB (ref 65–99)
POTASSIUM: 3.3 mmol/L — AB (ref 3.5–5.1)
SODIUM: 139 mmol/L (ref 135–145)
Total Bilirubin: 2 mg/dL — ABNORMAL HIGH (ref 0.3–1.2)
Total Protein: 6.2 g/dL — ABNORMAL LOW (ref 6.5–8.1)

## 2015-07-01 LAB — LIPASE, BLOOD: LIPASE: 17 U/L (ref 11–51)

## 2015-07-01 NOTE — Progress Notes (Signed)
  Subjective:  Patient states abdominal pain is mild. He denies chest pain or shortness of breath. He wants to stand up.  Objective: Blood pressure 170/86, pulse 97, temperature 97.6 F (36.4 C), temperature source Oral, resp. rate 24, height 5\' 11"  (1.803 m), weight 230 lb 6.4 oz (104.509 kg), SpO2 100 %. Patient is mildly drowsy but responds appropriately to all questions. Conjunctiva is pink. Sclera is nonicteric Oropharyngeal mucosa is normal. No neck masses or thyromegaly noted. Cardiac exam with regular rhythm normal S1 and S2. No murmur or gallop noted. Auscultation of lungs revealed basilar crackles. No rhonchi noted. Abdomen is distended. Bowel sounds are normal. On palpation abdomen is somewhat tight with mild tenderness in mid epigastrium and right low quadrant. No masses noted. No LE edema or clubbing noted.  Labs/studies Results:   Recent Labs  06/29/15 0535 07/01/15 1110  WBC 9.3 7.2  HGB 12.4* 12.1*  HCT 38.2* 37.4*  PLT 155 134*    BMET   Recent Labs  06/29/15 0535 06/30/15 0836 07/01/15 1110  NA 139 138 139  K 3.1* 3.2* 3.3*  CL 105 107 104  CO2 27 27 29   GLUCOSE 120* 102* 102*  BUN 23* 22* 18  CREATININE 1.00 0.72 0.82  CALCIUM 7.5* 7.7* 8.0*    LFT   Recent Labs  06/29/15 0535 06/30/15 0836 07/01/15 1110  PROT 6.6 5.9* 6.2*  ALBUMIN 3.2* 2.7* 2.7*  AST 36 29 40  ALT 31 28 36  ALKPHOS 42 40 47  BILITOT 2.6* 1.7* 2.0*  BILIDIR 0.9*  --   --   IBILI 1.7*  --   --     Serum lipase is 17.  Assessment:  #1. Alcoholic pancreatitis. Pain severity has decreased. Renal function is well-preserved. Corrected serum calcium is 9.0. He may be ready for diet to be advanced. #2. Confusion. Patient's daughter Sharyn Lull does not agree with diagnosis of alcohol withdrawal syndrome and she feels convinced that confusion is secondary to pain medications. Last dose of lorazepam was just before 9 PM yesterday. #3. Mild hyperbilirubinemia. Based on  fractionation Gilbert syndrome suspected. Alkaline phosphatase and transaminases remain normal. #4. Mild hypokalemia. Serum K is coming up.   Recommendations:  Lorazepam order for daily at bedtime when necessary discontinued. Full liquid diet. Physical activity as tolerated.

## 2015-07-01 NOTE — Progress Notes (Addendum)
Placed patient on HFNC at 6lpm, after xopenex neb, Patient is audible upper airway wheezing , with long exp component. 1:3or4 , forced suspect this is related  To positive fluid, @ 8 liter. Patient may need cath as he has to stand up and urinate. He also is in DT from chronic alcohol. Albumin is also low adding to  third space fluid problem .Will continue to monitor.

## 2015-07-01 NOTE — Progress Notes (Signed)
Patient is wheezing with increased work of breathing. Also patient is voiding frequently but small amounts at a time with each void only yielding 50-100cc. Did a post void residual to see if patient was retaining urine. Bladder scan showed no urine in bladder post void. Concerned that patient may be going into fluid overload. O2 is 96% on 6 Liters/nasal cannula. Spoke with on call hospitalist about all of this. No new orders at this time. Will continue to monitor closely.

## 2015-07-01 NOTE — Progress Notes (Signed)
PROGRESS NOTE    RYERSON STUKEL  [id number] DOB: 1940/12/23 DOA: 06/26/2015 PCP: Wende Neighbors, MD     Brief Narrative:  75 year old man admitted to the hospital on 5/31 with complaints of abdominal pain. Was found to have acute pancreatitis with a lipase greater than 3000 and no signs of necrosis on his CT scan. GI, Dr. Laural Golden has been consulted at patient's request. He is now going through DTs   Assessment & Plan:   Principal Problem:   Acute pancreatitis Active Problems:   Paroxysmal atrial fibrillation (HCC)   Hypothyroidism   Dyspnea   Abnormal LFTs   Idiopathic acute pancreatitis without infection or necrosis   Acute pancreatitis -When he is awake enough to tell us it appears that his abdominal pain is improving.  Alcohol abuse with withdrawals -thiamine/folate. -Seems less combative and aggressive today. -Daughter does not agree with this diagnosis, however we have been told that he drinks about 4-5 glasses of liquor per day. The timing of his confusion in the hospital correlates with DTs. -Agree with discontinuation of Ativan at this time per daughter's request. Can consider restarting at any point if he has worsening of his DTs.  Hypothyroidism -Continue Synthroid IV.  Atrial fibrillation -Rate controlled, currently in sinus rhythm, not anticoagulated   DVT prophylaxis: Lovenox Code Status: Full code Family Communication: [name], significant other at bedside updated on plan of care and all questions answered. Disposition Plan: To be determined  Consultants:   GI  Procedures:   None  Antimicrobials:   None    Subjective: Lying in bed, arouses briefly to tell me that his abdominal pain is improved however quickly falls back to sleep.  Objective: Filed Vitals:   06/30/15 2234 07/01/15 0211 07/01/15 0624 07/01/15 1353  BP: 150/80  170/86 167/85  Pulse: 101  97 95  Temp: 98.3 F (36.8 C)  97.6 F (36.4 C) 98.3 F (36.8 C)  TempSrc:  Oral  Oral Oral  Resp:    22  Height:      Weight:      SpO2: 96% 96% 100% 100%    Intake/Output Summary (Last 24 hours) at 07/01/15 1547 Last data filed at 07/01/15 1346  Gross per 24 hour  Intake 2536.25 ml  Output    525 ml  Net 2011.25 ml   Filed Weights   06/26/15 0720 06/26/15 1528  Weight: 104.327 kg (230 lb) 104.509 kg (230 lb 6.4 oz)    Examination:  General exam: Drowsy, difficult to arouse  Respiratory system: Has upper airway wheezing, when auscultating lungs no wheezing identified. Cardiovascular system:RRR. No murmurs, rubs, gallops. Gastrointestinal system: Abdomen is distended,hypoactive bowel sounds.  Central nervous system: Alert and oriented. No focal neurological deficits. Extremities: No C/C/E, +pedal pulses Skin: No rashes, lesions or ulcers Psychiatry: Unable to assess given current mental state    Data Reviewed: I have personally reviewed following labs and imaging studies  CBC:  Recent Labs Lab 06/26/15 0730 06/27/15 0553 06/28/15 0634 06/29/15 0535 07/01/15 1110  WBC 8.0 11.3* 10.2 9.3 7.2  HGB 14.1 13.8 12.5* 12.4* 12.1*  HCT 41.1 42.3 38.9* 38.2* 37.4*  MCV 94.1 96.4 98.2 98.5 97.9  PLT 166 163 138* 155 [id number]*   Basic Metabolic Panel:  Recent Labs Lab 06/27/15 0553 06/28/15 0634 06/29/15 0535 06/30/15 0836 07/01/15 1110  NA 139 140 139 138 139  K 4.1 3.6 3.1* 3.2* 3.3*  CL 109 112* 105 107 104  CO2 26 23 27  27 29  GLUCOSE 122* 115* 120* 102* 102*  BUN 19 27* 23* 22* 18  CREATININE 0.77 0.82 1.00 0.72 0.82  CALCIUM 7.9* 7.1* 7.5* 7.7* 8.0*   GFR: Estimated Creatinine Clearance: 95.8 mL/min (by C-G formula based on Cr of 0.82). Liver Function Tests:  Recent Labs Lab 06/27/15 0553 06/28/15 0634 06/28/15 0928 06/29/15 0535 06/30/15 0836 07/01/15 1110  AST 31 34  --  36 29 40  ALT 34 33  --  31 28 36  ALKPHOS 33* 34*  --  42 40 47  BILITOT 1.7* 4.5* 4.8* 2.6* 1.7* 2.0*  PROT 5.9* 6.1*  --  6.6 5.9* 6.2*  ALBUMIN  3.3* 3.1*  --  3.2* 2.7* 2.7*    Recent Labs Lab 06/26/15 0730 06/27/15 0553 06/28/15 0634 07/01/15 1110  LIPASE >3000* 304* 101* 17   No results for input(s): AMMONIA in the last 168 hours. Coagulation Profile: No results for input(s): INR, PROTIME in the last 168 hours. Cardiac Enzymes:  Recent Labs Lab 06/26/15 0730  TROPONINI <0.03   BNP (last 3 results) No results for input(s): PROBNP in the last 8760 hours. HbA1C: No results for input(s): HGBA1C in the last 72 hours. CBG:  Recent Labs Lab 06/30/15 2210  GLUCAP 99   Lipid Profile: No results for input(s): CHOL, HDL, LDLCALC, TRIG, CHOLHDL, LDLDIRECT in the last 72 hours. Thyroid Function Tests: No results for input(s): TSH, T4TOTAL, FREET4, T3FREE, THYROIDAB in the last 72 hours. Anemia Panel: No results for input(s): VITAMINB12, FOLATE, FERRITIN, TIBC, IRON, RETICCTPCT in the last 72 hours. Urine analysis:    Component Value Date/Time   COLORURINE YELLOW 06/26/2015 0909   APPEARANCEUR CLEAR 06/26/2015 0909   LABSPEC 1.020 06/26/2015 0909   PHURINE 5.5 06/26/2015 0909   GLUCOSEU NEGATIVE 06/26/2015 0909   HGBUR TRACE* 06/26/2015 0909   BILIRUBINUR NEGATIVE 06/26/2015 0909   KETONESUR NEGATIVE 06/26/2015 0909   PROTEINUR NEGATIVE 06/26/2015 0909   NITRITE NEGATIVE 06/26/2015 0909   LEUKOCYTESUR NEGATIVE 06/26/2015 0909   Sepsis Labs: @LABRCNTIP (procalcitonin:4,lacticidven:4)  )No results found for this or any previous visit (from the past 240 hour(s)).       Radiology Studies: US Abdomen Complete  06/30/2015  CLINICAL DATA:  Abnormal liver enzymes and pain.  Acute pancreatitis EXAM: ABDOMEN ULTRASOUND COMPLETE COMPARISON:  CT abdomen and pelvis Jun 26, 2015 FINDINGS: Gallbladder: There is sludge in the gallbladder. There are no echogenic foci in the gallbladder which move and shadow as is expected with gallstones. The gallbladder wall is mildly thickened. There is no pericholecystic fluid. No  sonographic Murphy sign noted by sonographer. Common bile duct: Diameter: 4 mm. There is no intrahepatic, common hepatic, or common bile duct dilatation. Liver: No focal lesion identified. Liver echogenicity is increased diffusely. There is mild fatty sparing near the gallbladder fossa. IVC: Limited visualization due to bowel gas. Visualized portions appear normal. Pancreas: Pancreas completely obscured by gas. Spleen: Spleen is enlarged measuring 13.9 x 6.7 x 12.2 cm with a measured splenic volume of 616 cubic cm. No focal splenic lesions are identified. Right Kidney: Length: 12.2 cm. Echogenicity and renal cortical thickness are within normal limits. No mass or hydronephrosis visualized. Left Kidney: Length: 12.0 cm. Echogenicity and renal cortical thickness are within normal limits. No mass or hydronephrosis visualized. There is a 6 mm calculus in the mid to lower pole left kidney. Abdominal aorta: No aneurysm visualized in visualized areas. Portions the or obscured by gas. Other findings: There is no demonstrable ascites. There is  a small right pleural effusion. IMPRESSION: Pancreas obscured by gas. Comparison of pancreatitis change with recent CT cannot be made given this gas obscuring the pancreas. Sludge in gallbladder. Gallbladder wall mildly thickened. A degree of acalculus cholecystitis cannot be excluded. This finding may warrant nuclear medicine hepatobiliary imaging study to assess for cystic duct patency. Diffuse increased liver echogenicity. Mild fatty sparing near the gallbladder fossa noted. While no focal liver lesions beyond mild fatty sparing are noted, it should be noted that the sensitivity of ultrasound for focal liver lesions is diminished in this circumstance. Splenomegaly. 6 mm calculus left kidney, nonobstructing. Small right pleural effusion. Electronically Signed   By: Lowella Grip III M.D.   On: 06/30/2015 17:41   Dg Chest Port 1 View  07/01/2015  CLINICAL DATA:  Short of breath,  wheezing. Increased work of breathing. EXAM: PORTABLE CHEST 1 VIEW COMPARISON:  06/29/2015 FINDINGS: Stable enlarged cardiac silhouette. There is extremely low lung volumes similar to prior. The gas-filled colon noted between the LEFT and RIGHT hemidiaphragms. Rounded density projecting over the lateral LEFT lower lobe and cardiac border measuring approximate 6 cm appears to be removed abetween the first and second images. There is new atelectasis or effusion at the LEFT lung base. IMPRESSION: 1. Extremely low lung volumes and basilar atelectasis, not changed from prior. 2. New atelectasis or effusion in the LEFT lower lobe. Infiltrate felt less likely. Electronically Signed   By: Suzy Bouchard M.D.   On: 07/01/2015 10:08        Scheduled Meds: . enoxaparin (LOVENOX) injection  40 mg Subcutaneous Q24H  . folic acid  1 mg Oral Daily  . levothyroxine  75 mcg Intravenous Daily  . multivitamin with minerals  1 tablet Oral Daily  . thiamine  100 mg Oral Daily   Or  . thiamine  100 mg Intravenous Daily   Continuous Infusions: . sodium chloride 75 mL/hr at 06/30/15 2052     LOS: 5 days    Time spent: 25 minutes. Greater than 50% of this time was spent in direct contact with the patient coordinating care.     Lelon Frohlich, MD [facility] Pager [phone]  If 7PM-7AM, please contact night-coverage [email or web address] Password [facility] 07/01/2015, 3:47 PM

## 2015-07-01 NOTE — Progress Notes (Signed)
Patient is agitated and trying to get out of bed. Daughter at the bedside. Offered to give patient ativan and explained to daughter that we use ativan to help calm patients who are in DTs. Daughter states "he is not in DTs. He is agitated because of all the medicines that you all keep giving him". Daughter stated she does not want me to give ativan at this time.

## 2015-07-02 LAB — COMPREHENSIVE METABOLIC PANEL
ALBUMIN: 2.6 g/dL — AB (ref 3.5–5.0)
ALK PHOS: 50 U/L (ref 38–126)
ALT: 46 U/L (ref 17–63)
AST: 45 U/L — AB (ref 15–41)
Anion gap: 6 (ref 5–15)
BILIRUBIN TOTAL: 1.9 mg/dL — AB (ref 0.3–1.2)
BUN: 17 mg/dL (ref 6–20)
CALCIUM: 8 mg/dL — AB (ref 8.9–10.3)
CO2: 29 mmol/L (ref 22–32)
CREATININE: 0.76 mg/dL (ref 0.61–1.24)
Chloride: 102 mmol/L (ref 101–111)
GFR calc Af Amer: >60 mL/min (ref >60)
GFR calc non Af Amer: >60 mL/min (ref >60)
Glucose, Bld: 105 mg/dL — ABNORMAL HIGH (ref 65–99)
Potassium: 2.9 mmol/L — ABNORMAL LOW (ref 3.5–5.1)
Sodium: 137 mmol/L (ref 135–145)
Total Protein: 6.1 g/dL — ABNORMAL LOW (ref 6.5–8.1)

## 2015-07-02 LAB — GLUCOSE, CAPILLARY: Glucose-Capillary: 136 mg/dL — ABNORMAL HIGH (ref 65–99)

## 2015-07-02 LAB — LIPASE, BLOOD: Lipase: 16 U/L (ref 11–51)

## 2015-07-02 MED ORDER — POTASSIUM CHLORIDE CRYS ER 20 MEQ PO TBCR
20.0000 meq | EXTENDED_RELEASE_TABLET | Freq: Two times a day (BID) | ORAL | Status: DC
  Administered 2015-07-02 – 2015-07-03 (×3): 20 meq via ORAL
  Filled 2015-07-02 (×3): qty 1

## 2015-07-02 NOTE — Progress Notes (Signed)
PROGRESS NOTE    Andrew Watson  [id number] DOB: November 03, 1940 DOA: 06/26/2015 PCP: Andrew Neighbors, MD     Brief Narrative:  75 year old man admitted to the hospital on 5/31 with complaints of abdominal pain. Was found to have acute pancreatitis with a lipase greater than 3000 and no signs of necrosis on his CT scan. GI, Dr. Laural Watson has been consulted at patient's request. He is now going through DTs although they're starting to improve.   Assessment & Plan:   Principal Problem:   Acute pancreatitis Active Problems:   Paroxysmal atrial fibrillation (HCC)   Hypothyroidism   Dyspnea   Abnormal LFTs   Idiopathic acute pancreatitis without infection or necrosis   Acute pancreatitis -Abdominal pain is improving, diet has been advanced.  Alcohol abuse with withdrawals -thiamine/folate. -Much more alert and awake today, conversant although still drowsy and falls back asleep during our conversation.  Hypothyroidism -Continue Synthroid IV, consider transitioning to by mouth in a.m. if consistently taking orals.  Atrial fibrillation -Rate controlled, currently in sinus rhythm, not anticoagulated   DVT prophylaxis: Lovenox Code Status: Full code Family Communication: Andrew Watson, significant other at bedside updated on plan of care and all questions answered. Disposition Plan: To be determined  Consultants:   GI  Procedures:   None  Antimicrobials:   None    Subjective: In bed, much more awake  Objective: Filed Vitals:   07/01/15 2354 07/02/15 0527 07/02/15 0617 07/02/15 1423  BP: 153/81 142/76  118/69  Pulse: 105 107  99  Temp: 99 F (37.2 C) 99.2 F (37.3 C)  98.7 F (37.1 C)  TempSrc: Oral Oral  Oral  Resp: 22 20  21   Height:      Weight:   113.4 kg (250 lb)   SpO2: 94% 96%  97%    Intake/Output Summary (Last 24 hours) at 07/02/15 1518 Last data filed at 07/02/15 1444  Gross per 24 hour  Intake    600 ml  Output    350 ml  Net    250 ml   Filed  Weights   06/26/15 0720 06/26/15 1528 07/02/15 0617  Weight: 104.327 kg (230 lb) 104.509 kg (230 lb 6.4 oz) 113.4 kg (250 lb)    Examination:  General exam: Awake, alert, oriented, still sleepy Respiratory system: Has upper airway wheezing, when auscultating lungs no wheezing identified. Cardiovascular system:RRR. No murmurs, rubs, gallops. Gastrointestinal system: Abdomen is distended,hypoactive bowel sounds.  Central nervous system: Alert and oriented. No focal neurological deficits. Extremities: No C/C/E, +pedal pulses Skin: No rashes, lesions or ulcers    Data Reviewed: I have personally reviewed following labs and imaging studies  CBC:  Recent Labs Lab 06/26/15 0730 06/27/15 0553 06/28/15 0634 06/29/15 0535 07/01/15 1110  WBC 8.0 11.3* 10.2 9.3 7.2  HGB 14.1 13.8 12.5* 12.4* 12.1*  HCT 41.1 42.3 38.9* 38.2* 37.4*  MCV 94.1 96.4 98.2 98.5 97.9  PLT 166 163 138* 155 [id number]*   Basic Metabolic Panel:  Recent Labs Lab 06/28/15 0634 06/29/15 0535 06/30/15 0836 07/01/15 1110 07/02/15 0602  NA 140 139 138 139 137  K 3.6 3.1* 3.2* 3.3* 2.9*  CL 112* 105 107 104 102  CO2 23 27 27 29 29   GLUCOSE 115* 120* 102* 102* 105*  BUN 27* 23* 22* 18 17  CREATININE 0.82 1.00 0.72 0.82 0.76  CALCIUM 7.1* 7.5* 7.7* 8.0* 8.0*   GFR: Estimated Creatinine Clearance: 102.1 mL/min (by C-G formula based on Cr of 0.76). Liver Function  Tests:  Recent Labs Lab 06/28/15 0634 06/28/15 0928 06/29/15 0535 06/30/15 0836 07/01/15 1110 07/02/15 0602  AST 34  --  36 29 40 45*  ALT 33  --  31 28 36 46  ALKPHOS 34*  --  42 40 47 50  BILITOT 4.5* 4.8* 2.6* 1.7* 2.0* 1.9*  PROT 6.1*  --  6.6 5.9* 6.2* 6.1*  ALBUMIN 3.1*  --  3.2* 2.7* 2.7* 2.6*    Recent Labs Lab 06/26/15 0730 06/27/15 0553 06/28/15 0634 07/01/15 1110 07/02/15 0602  LIPASE >3000* 304* 101* 17 16   No results for input(s): AMMONIA in the last 168 hours. Coagulation Profile: No results for input(s): INR, PROTIME  in the last 168 hours. Cardiac Enzymes:  Recent Labs Lab 06/26/15 0730  TROPONINI <0.03   BNP (last 3 results) No results for input(s): PROBNP in the last 8760 hours. HbA1C: No results for input(s): HGBA1C in the last 72 hours. CBG:  Recent Labs Lab 06/30/15 2210  GLUCAP 99   Lipid Profile: No results for input(s): CHOL, HDL, LDLCALC, TRIG, CHOLHDL, LDLDIRECT in the last 72 hours. Thyroid Function Tests: No results for input(s): TSH, T4TOTAL, FREET4, T3FREE, THYROIDAB in the last 72 hours. Anemia Panel: No results for input(s): VITAMINB12, FOLATE, FERRITIN, TIBC, IRON, RETICCTPCT in the last 72 hours. Urine analysis:    Component Value Date/Time   COLORURINE YELLOW 06/26/2015 [place] 06/26/2015 0909   LABSPEC 1.020 06/26/2015 0909   PHURINE 5.5 06/26/2015 0909   GLUCOSEU NEGATIVE 06/26/2015 0909   HGBUR TRACE* 06/26/2015 0909   BILIRUBINUR NEGATIVE 06/26/2015 0909   KETONESUR NEGATIVE 06/26/2015 0909   PROTEINUR NEGATIVE 06/26/2015 0909   NITRITE NEGATIVE 06/26/2015 0909   LEUKOCYTESUR NEGATIVE 06/26/2015 0909   Sepsis Labs: @LABRCNTIP (procalcitonin:4,lacticidven:4)  )No results found for this or any previous visit (from the past 240 hour(s)).       Radiology Studies: US Abdomen Complete  06/30/2015  CLINICAL DATA:  Abnormal liver enzymes and pain.  Acute pancreatitis EXAM: ABDOMEN ULTRASOUND COMPLETE COMPARISON:  CT abdomen and pelvis Jun 26, 2015 FINDINGS: Gallbladder: There is sludge in the gallbladder. There are no echogenic foci in the gallbladder which move and shadow as is expected with gallstones. The gallbladder wall is mildly thickened. There is no pericholecystic fluid. No sonographic Murphy sign noted by sonographer. Common bile duct: Diameter: 4 mm. There is no intrahepatic, common hepatic, or common bile duct dilatation. Liver: No focal lesion identified. Liver echogenicity is increased diffusely. There is mild fatty sparing near the  gallbladder fossa. IVC: Limited visualization due to bowel gas. Visualized portions appear normal. Pancreas: Pancreas completely obscured by gas. Spleen: Spleen is enlarged measuring 13.9 x 6.7 x 12.2 cm with a measured splenic volume of 616 cubic cm. No focal splenic lesions are identified. Right Kidney: Length: 12.2 cm. Echogenicity and renal cortical thickness are within normal limits. No mass or hydronephrosis visualized. Left Kidney: Length: 12.0 cm. Echogenicity and renal cortical thickness are within normal limits. No mass or hydronephrosis visualized. There is a 6 mm calculus in the mid to lower pole left kidney. Abdominal aorta: No aneurysm visualized in visualized areas. Portions the or obscured by gas. Other findings: There is no demonstrable ascites. There is a small right pleural effusion. IMPRESSION: Pancreas obscured by gas. Comparison of pancreatitis change with recent CT cannot be made given this gas obscuring the pancreas. Sludge in gallbladder. Gallbladder wall mildly thickened. A degree of acalculus cholecystitis cannot be excluded. This finding may warrant  nuclear medicine hepatobiliary imaging study to assess for cystic duct patency. Diffuse increased liver echogenicity. Mild fatty sparing near the gallbladder fossa noted. While no focal liver lesions beyond mild fatty sparing are noted, it should be noted that the sensitivity of ultrasound for focal liver lesions is diminished in this circumstance. Splenomegaly. 6 mm calculus left kidney, nonobstructing. Small right pleural effusion. Electronically Signed   By: Lowella Grip III M.D.   On: 06/30/2015 17:41   Dg Chest Port 1 View  07/01/2015  CLINICAL DATA:  Short of breath, wheezing. Increased work of breathing. EXAM: PORTABLE CHEST 1 VIEW COMPARISON:  06/29/2015 FINDINGS: Stable enlarged cardiac silhouette. There is extremely low lung volumes similar to prior. The gas-filled colon noted between the LEFT and RIGHT hemidiaphragms. Rounded  density projecting over the lateral LEFT lower lobe and cardiac border measuring approximate 6 cm appears to be removed abetween the first and second images. There is new atelectasis or effusion at the LEFT lung base. IMPRESSION: 1. Extremely low lung volumes and basilar atelectasis, not changed from prior. 2. New atelectasis or effusion in the LEFT lower lobe. Infiltrate felt less likely. Electronically Signed   By: Suzy Bouchard M.D.   On: 07/01/2015 10:08        Scheduled Meds: . enoxaparin (LOVENOX) injection  40 mg Subcutaneous Q24H  . folic acid  1 mg Oral Daily  . levothyroxine  75 mcg Intravenous Daily  . multivitamin with minerals  1 tablet Oral Daily  . potassium chloride  20 mEq Oral BID  . thiamine  100 mg Oral Daily   Or  . thiamine  100 mg Intravenous Daily   Continuous Infusions: . sodium chloride 75 mL/hr at 07/02/15 0212     LOS: 6 days    Time spent: 25 minutes. Greater than 50% of this time was spent in direct contact with the patient coordinating care.     Lelon Frohlich, MD [facility] Pager [phone]  If 7PM-7AM, please contact night-coverage [email or web address] Password [email or web address] 07/02/2015, 3:18 PM

## 2015-07-02 NOTE — Progress Notes (Signed)
Patient is eating his supper. He complains of abdominal bloating but denies pain. He has been complaining of groin pain. According to nursing staff he walked in the hall for a few minutes. Patient will be reevaluated in a.m.

## 2015-07-02 NOTE — Progress Notes (Signed)
   Subjective:  Patient wants to know when he could eat ice cream. He did experience nausea yesterday afternoon but none since then. He denies epigastric pain. He complains of pain in right low quadrant and both hips when he moves around. He also complains of lower back pain. He states he has had this back pain chronically. He is taking pain medication for back pain on and not abdominal pain. According to his daughter he's been ambulating in the hall without any difficulty.  Objective: Blood pressure 142/76, pulse 107, temperature 99.2 F (37.3 C), temperature source Oral, resp. rate 20, height 5\' 11"  (1.803 m), weight 250 lb (113.4 kg), SpO2 96 %. Patient is much more alert today. Conjunctiva is pink. Sclera is nonicteric Oropharyngeal mucosa is normal. No neck masses or thyromegaly noted. Cardiac exam with regular rhythm normal S1 and S2. Faint systolic ejection murmur noted at left sternal border. Few basilar crackles noted. Abdomen remains distended. Bowel sounds are normal. On palpation it somewhat tense with minimal tenderness and acute. No organomegaly or masses No LE edema or clubbing noted.  Labs/studies Results:   Recent Labs  07/01/15 1110  WBC 7.2  HGB 12.1*  HCT 37.4*  PLT 134*    BMET   Recent Labs  06/30/15 0836 07/01/15 1110 07/02/15 0602  NA 138 139 137  K 3.2* 3.3* 2.9*  CL 107 104 102  CO2 27 29 29   GLUCOSE 102* 102* 105*  BUN 22* 18 17  CREATININE 0.72 0.82 0.76  CALCIUM 7.7* 8.0* 8.0*    LFT   Recent Labs  06/30/15 0836 07/01/15 1110 07/02/15 0602  PROT 5.9* 6.2* 6.1*  ALBUMIN 2.7* 2.7* 2.6*  AST 29 40 45*  ALT 28 36 46  ALKPHOS 40 47 50  BILITOT 1.7* 2.0* 1.9*      Assessment:  #1. Alcoholic pancreatitis. Patient is tolerating full liquids. Abdomen remains quite tense but with minimal tenderness at RLQ. #2. Hypokalemia. #3. Mild anemia secondary to acute illness. #4. Mild thrombocytopenia. He does not have  splenomegaly   Recommendations:  Will advance diet to heart healthy diet later today if he does well with breakfast and lunch. KCl 20 mEq by mouth twice a day. CBC and metabolic 7 in a.m.

## 2015-07-02 NOTE — Care Management Note (Addendum)
Case Management Note  Patient Details  Name: Andrew Watson MRN: [id number] Date of Birth: 14-Jan-1941   Expected Discharge Date:              07/03/2015    Expected Discharge Plan:  Home/Self Care  In-House Referral:     Discharge planning Services  CM Consult  Post Acute Care Choice:    Choice offered to:     DME Arranged:    DME Agency:     HH Arranged:    [place] Agency:     Status of Service:  Completed, signed off  Medicare Important Message Given:  Yes Date Medicare IM Given:    Medicare IM give by:    Date Additional Medicare IM Given:    Additional Medicare Important Message give by:     If discussed at [place] of Stay Meetings, dates discussed: Discussed 07/02/2015 Additional Comments:  Kitzia Camus, Chauncey [place], RN 07/02/2015, 1:59 PM

## 2015-07-03 ENCOUNTER — Inpatient Hospital Stay (HOSPITAL_COMMUNITY): Payer: Medicare Other

## 2015-07-03 DIAGNOSIS — I4891 Unspecified atrial fibrillation: Secondary | ICD-10-CM

## 2015-07-03 DIAGNOSIS — Z7289 Other problems related to lifestyle: Secondary | ICD-10-CM

## 2015-07-03 DIAGNOSIS — Z789 Other specified health status: Secondary | ICD-10-CM

## 2015-07-03 LAB — CBC
HEMATOCRIT: 32.4 % — AB (ref 39.0–52.0)
Hemoglobin: 10.8 g/dL — ABNORMAL LOW (ref 13.0–17.0)
MCH: 32.2 pg (ref 26.0–34.0)
MCHC: 33.3 g/dL (ref 30.0–36.0)
MCV: 96.7 fL (ref 78.0–100.0)
Platelets: 153 10*3/uL (ref 150–400)
RBC: 3.35 MIL/uL — AB (ref 4.22–5.81)
RDW: 13.8 % (ref 11.5–15.5)
WBC: 10.7 10*3/uL — ABNORMAL HIGH (ref 4.0–10.5)

## 2015-07-03 LAB — CREATININE, SERUM
Creatinine, Ser: 0.75 mg/dL (ref 0.61–1.24)
GFR calc Af Amer: >60 mL/min (ref >60)

## 2015-07-03 MED ORDER — FUROSEMIDE 10 MG/ML IJ SOLN
20.0000 mg | Freq: Once | INTRAMUSCULAR | Status: AC
  Administered 2015-07-03: 20 mg via INTRAVENOUS
  Filled 2015-07-03: qty 2

## 2015-07-03 MED ORDER — LEVOTHYROXINE SODIUM 100 MCG IV SOLR
75.0000 ug | Freq: Every day | INTRAVENOUS | Status: DC
  Administered 2015-07-04 – 2015-07-06 (×3): 75 ug via INTRAVENOUS
  Filled 2015-07-03 (×5): qty 5

## 2015-07-03 MED ORDER — POLYETHYLENE GLYCOL 3350 17 G PO PACK
17.0000 g | PACK | Freq: Two times a day (BID) | ORAL | Status: DC
  Administered 2015-07-03 – 2015-07-05 (×4): 17 g via ORAL
  Filled 2015-07-03 (×5): qty 1

## 2015-07-03 MED ORDER — IOPAMIDOL (ISOVUE-300) INJECTION 61%
100.0000 mL | Freq: Once | INTRAVENOUS | Status: AC | PRN
  Administered 2015-07-03: 100 mL via INTRAVENOUS

## 2015-07-03 MED ORDER — SENNA 8.6 MG PO TABS
1.0000 | ORAL_TABLET | Freq: Every day | ORAL | Status: DC
  Administered 2015-07-03 – 2015-07-04 (×2): 8.6 mg via ORAL
  Filled 2015-07-03 (×2): qty 1

## 2015-07-03 MED ORDER — LEVOTHYROXINE SODIUM 75 MCG PO TABS: 150.0000 ug | ORAL_TABLET | Freq: Every day | ORAL | Status: DC

## 2015-07-03 MED ORDER — KCL IN DEXTROSE-NACL 20-5-0.45 MEQ/L-%-% IV SOLN
INTRAVENOUS | Status: DC
  Administered 2015-07-03: 18:00:00 via INTRAVENOUS

## 2015-07-03 MED ORDER — DIATRIZOATE MEGLUMINE & SODIUM 66-10 % PO SOLN
ORAL | Status: AC
  Administered 2015-07-03: 30 mL
  Filled 2015-07-03: qty 30

## 2015-07-03 NOTE — Progress Notes (Signed)
PROGRESS NOTE  Andrew Watson [id number] DOB: 1940-06-27 DOA: 06/26/2015 PCP: Wende Neighbors, MD  Brief Narrative: 73 yom admitted with complaints of abdominal pain. Due to further evaluation he was found to have acute pancreatitis with lipase greater than 3000. Hospitalization noted to be complicated with delirium tremens.   Assessment/Plan: 1. Alcoholic acute pancreatitis. Diet advanced per gastroenterology, abdomen more distended today. Abdominal pelvic CT revealed worsening pancreatitis. Last lipase WNL  6/6. 2. Alcohol use disorder with acute withdrawal, delirium tremens. Resolved.  3. Anemia of acute illness. Stable.  4. Mild thrombocytopenia. No splenomegaly. 5. Chronic back pain. Stable.  6. Hypothyroidism .Conitnue synthroid.  7. Atrial fibrillation. Currently in sinus rhythm. Not anticoagulated.   Overall appears better despite CT findings. Mentation intact. Discussed with Dr. Laural Golden, agree with nothing by mouth status.  Monitor volume status closely.  DVT prophylaxis: Lovenox  Code Status: Full Family Communication:  No family bedside.  Disposition Plan: Anticipate discharge in 2-3 days.   Murray Hodgkins, MD  [facility] Direct contact: [phone] --Via amion app OR  --[email or web address]; password [email or web address]  7PM-7AM contact night coverage as above 07/03/2015, 4:18 PM  LOS: 7 days   Consultants:  GI  Procedures:  None  Antimicrobials:  None  HPI/Subjective: Feeling better. Swelling is improving. Mild abdominal pain with cough. Nausea and vomiting resolved. Has not had a bowel movement in the last week.  Objective: Filed Vitals:   07/02/15 1423 07/02/15 2047 07/03/15 0444 07/03/15 1445  BP: 118/69 144/75 153/60 149/84  Pulse: 99 91 94 99  Temp: 98.7 F (37.1 C) 98.9 F (37.2 C) 97.7 F (36.5 C) 98.5 F (36.9 C)  TempSrc: Oral Oral Oral Axillary  Resp: 21 20 19 16   Height:      Weight:      SpO2: 97% 100% 100% 100%    Intake/Output Summary  (Last 24 hours) at 07/03/15 1618 Last data filed at 07/03/15 1400  Gross per 24 hour  Intake 2518.75 ml  Output    650 ml  Net 1868.75 ml     Filed Weights   06/26/15 0720 06/26/15 1528 07/02/15 0617  Weight: 104.327 kg (230 lb) 104.509 kg (230 lb 6.4 oz) 113.4 kg (250 lb)    Exam: Constitutional:  . Appears calm and comfortable Respiratory:  . CTA bilaterally, no w/r/r.  . Respiratory effort normal. No retractions or accessory muscle use Cardiovascular:  . RRR, no m/r/g . 2+ LE extremity edema   . Telemetry SR Abdomen:  . Abdomen appears normal; No masses. Mild lower abdominal tenderness to palpation no epigastric tenderness.  Psychiatric:  . judgement and insight appear normal . Mental status o Mood, affect appropriate  I have personally reviewed following labs and imaging studies:  WBC 10.7  Hgb 10.8   Scheduled Meds: . enoxaparin (LOVENOX) injection  40 mg Subcutaneous Q24H  . levothyroxine  75 mcg Intravenous Daily  . multivitamin with minerals  1 tablet Oral Daily  . polyethylene glycol  17 g Oral BID  . potassium chloride  20 mEq Oral BID  . senna  1 tablet Oral QHS   Continuous Infusions: . sodium chloride 75 mL/hr at 07/03/15 0457    Principal Problem:   Acute pancreatitis Active Problems:   Hypothyroidism   Alcohol use ([place])   Atrial fibrillation (HCC)   LOS: 7 days   Time spent 25 minutes  By signing my name below, I, Rennis Harding attest that this documentation has been prepared under the direction and in the  presence of Murray Hodgkins, MD Electronically signed: Rennis Harding 07/03/2015 1:43pm  I personally performed the services described in this documentation. All medical record entries made by the scribe were at my direction. I have reviewed the chart and agree that the record reflects my personal performance and is accurate and complete. Murray Hodgkins, MD

## 2015-07-03 NOTE — Progress Notes (Signed)
Key Points: Use following P&T approved IV to PO antibiotic change policy.  Description contains the criteria that are approved Note: Policy Excludes:  Esophagectomy patientsPHARMACIST - PHYSICIAN COMMUNICATION DR:   TRH CONCERNING: IV to Oral Route Change Policy  RECOMMENDATION: This patient is receiving Levothyrozine by the intravenous route.  Based on criteria approved by the Pharmacy and Therapeutics Committee, the intravenous medication(s) is/are being converted to the equivalent oral dose form(s).  DESCRIPTION: These criteria include:  The patient is eating (either orally or via tube) and/or has been taking other orally administered medications for a least 24 hours  The patient has no evidence of active gastrointestinal bleeding or impaired GI absorption (gastrectomy, short bowel, patient on TNA or NPO).  If you have questions about this conversion, please contact the Pharmacy Department  [x]   [phone] )  [facility] []   [phone] )  [facility] []   [phone] )  [facility] []   [phone] )  [facility] []   [phone] )  [place], [place], [facility] 07/03/2015 2:04 PM

## 2015-07-03 NOTE — Progress Notes (Signed)
Patient reported "swelling to my scrotum that's 4 times worse" this evening about 1845. Patient and family at bedside expressed concerns regarding swelling. Pt reported difficulty using urinal d/t swelling but was able to void. Patient requested "I need to see a urologist tonight or go to [place]."  Edema noted to scrotum and penis. Notified Dr. Sarajane Jews of patient complaints and request for urology consult. Patient and family also requested to speak with Dr. Laural Golden. Dr. Laural Golden paged, notified patient he was not on call tonight. Stated okay. Patient seen by Dr. Sarajane Jews this evening. MD addressed his concerns. Donavan Foil, RN

## 2015-07-03 NOTE — Progress Notes (Signed)
CT finding to reviewed with patient and his friend [name]. This study shows worsening peripancreatic inflammatory changes with extension of fluid into right posterior pararenal space as well as into right pelvic cavity and within right inguinal hernia. He has developed small amount of ascites. Most of the inflammatory changes involve pancreatic head. Rest of the pancreas is normal and enhances. Bile duct appears to be prominent but no evidence of choledocholithiasis. Therefore oral feeding the situation will make matters worse. Have discussed possibility of nasojejunal feeding with the patient.  Recommendations:  Nothing by mouth except ice chips and by mouth meds. Will change IV to D5 half-normal saline with KCl 20 mg/L at a rate of 100 ML per hour. Will DC oral potassium tomorrow when hypokalemia corrected. For repeat lab in a.m. Consider urology consultation as requested by patient and his daughter.

## 2015-07-03 NOTE — Progress Notes (Signed)
Patient c/o abdominal "tightness" this morning, pain medication offered as ordered. Pt declined stating will notify nursing if he needs pain medication. CT of abdomen completed, MD aware of results. Pt c/o constipation, stated "no bowel movement this month". Last stool 06/26/15 per patient. Senokot ordered PRN at bedtime only. Text-paged MD to notify. Donavan Foil, RN

## 2015-07-03 NOTE — Care Management Important Message (Signed)
Important Message  Patient Details  Name: Andrew Watson MRN: [id number] Date of Birth: 1940/12/20   Medicare Important Message Given:  Yes    Sherald Barge, RN 07/03/2015, 1:16 PM

## 2015-07-03 NOTE — Progress Notes (Signed)
  Subjective:  Patient continues complain of abdominal distention. He feels no better. Complains of pain in right low quadrant which she describes as mild. He denies nausea and vomiting. He's eating 30-50% his meals. He does not like the taste of hospital food. Denies chest pain or shortness of breath.   Objective: Blood pressure 153/60, pulse 94, temperature 97.7 F (36.5 C), temperature source Oral, resp. rate 19, height 5\' 11"  (1.803 m), weight 250 lb (113.4 kg), SpO2 100 %. Patient is alert and in no acute distress. Abdomen is distended. Bowel sounds are normal. On palpation abdomen is tight mild tenderness at RLQ. No LE edema or clubbing noted.  Labs/studies Results:   Recent Labs  07/01/15 1110 07/03/15 0543  WBC 7.2 10.7*  HGB 12.1* 10.8*  HCT 37.4* 32.4*  PLT 134* 153    BMET   Recent Labs  07/01/15 1110 07/02/15 0602 07/03/15 0543  NA 139 137  --   K 3.3* 2.9*  --   CL 104 102  --   CO2 29 29  --   GLUCOSE 102* 105*  --   BUN 18 17  --   CREATININE 0.82 0.76 0.75  CALCIUM 8.0* 8.0*  --     LFT   Recent Labs  07/01/15 1110 07/02/15 0602  PROT 6.2* 6.1*  ALBUMIN 2.7* 2.6*  AST 40 45*  ALT 36 46  ALKPHOS 47 50  BILITOT 2.0* 1.9*      Assessment:  #1.Alcoholic pancreatitis. Abdomen appears to be more distended. Renal function remains well preserved. Need to rule out pancreatic pseudocyst but he could also have pancreatic ascites. He will be further evaluated with abdominopelvic CT. #2. Anemia. No evidence of overt GI bleed. Need to rule out intra-abdominal bleed. However anemia may be due to acute illness.  Recommendations:  Proceed with abdominopelvic CT with contrast.

## 2015-07-04 ENCOUNTER — Inpatient Hospital Stay (HOSPITAL_COMMUNITY): Payer: Medicare Other

## 2015-07-04 LAB — COMPREHENSIVE METABOLIC PANEL
ALBUMIN: 2.1 g/dL — AB (ref 3.5–5.0)
ALK PHOS: 65 U/L (ref 38–126)
ALT: 54 U/L (ref 17–63)
ANION GAP: 5 (ref 5–15)
AST: 47 U/L — ABNORMAL HIGH (ref 15–41)
BUN: 11 mg/dL (ref 6–20)
CALCIUM: 7.6 mg/dL — AB (ref 8.9–10.3)
CO2: 32 mmol/L (ref 22–32)
Chloride: 98 mmol/L — ABNORMAL LOW (ref 101–111)
Creatinine, Ser: 0.81 mg/dL (ref 0.61–1.24)
GFR calc non Af Amer: >60 mL/min (ref >60)
GLUCOSE: 107 mg/dL — AB (ref 65–99)
POTASSIUM: 2.9 mmol/L — AB (ref 3.5–5.1)
SODIUM: 135 mmol/L (ref 135–145)
TOTAL PROTEIN: 5.4 g/dL — AB (ref 6.5–8.1)
Total Bilirubin: 1.3 mg/dL — ABNORMAL HIGH (ref 0.3–1.2)

## 2015-07-04 LAB — CBC
HEMATOCRIT: 32 % — AB (ref 39.0–52.0)
HEMOGLOBIN: 10.7 g/dL — AB (ref 13.0–17.0)
MCH: 32 pg (ref 26.0–34.0)
MCHC: 33.4 g/dL (ref 30.0–36.0)
MCV: 95.8 fL (ref 78.0–100.0)
Platelets: 171 10*3/uL (ref 150–400)
RBC: 3.34 MIL/uL — AB (ref 4.22–5.81)
RDW: 13.8 % (ref 11.5–15.5)
WBC: 11.9 10*3/uL — ABNORMAL HIGH (ref 4.0–10.5)

## 2015-07-04 LAB — MAGNESIUM: MAGNESIUM: 1.9 mg/dL (ref 1.7–2.4)

## 2015-07-04 MED ORDER — METOCLOPRAMIDE HCL 5 MG/ML IJ SOLN
10.0000 mg | Freq: Once | INTRAMUSCULAR | Status: AC
  Administered 2015-07-04: 10 mg via INTRAVENOUS
  Filled 2015-07-04: qty 2

## 2015-07-04 MED ORDER — DEXTROSE-NACL 5-0.45 % IV SOLN
INTRAVENOUS | Status: DC
  Administered 2015-07-04 – 2015-07-05 (×2): via INTRAVENOUS

## 2015-07-04 MED ORDER — JEVITY 1.2 CAL PO LIQD
ORAL | Status: AC
  Filled 2015-07-04: qty 237

## 2015-07-04 MED ORDER — POTASSIUM CHLORIDE 10 MEQ/100ML IV SOLN
10.0000 meq | INTRAVENOUS | Status: AC
  Administered 2015-07-04 (×4): 10 meq via INTRAVENOUS
  Filled 2015-07-04 (×2): qty 100

## 2015-07-04 MED ORDER — PANTOPRAZOLE SODIUM 40 MG PO TBEC
40.0000 mg | DELAYED_RELEASE_TABLET | Freq: Every day | ORAL | Status: DC
  Administered 2015-07-05 – 2015-07-07 (×3): 40 mg via ORAL
  Filled 2015-07-04 (×3): qty 1

## 2015-07-04 MED ORDER — JEVITY 1.2 CAL PO LIQD
1000.0000 mL | ORAL | Status: DC
  Administered 2015-07-04: 1000 mL
  Filled 2015-07-04 (×4): qty 1000

## 2015-07-04 MED ORDER — MILK AND MOLASSES ENEMA: 1.0000 | Freq: Once | RECTAL | Status: DC

## 2015-07-04 MED ORDER — FUROSEMIDE 10 MG/ML IJ SOLN
20.0000 mg | Freq: Two times a day (BID) | INTRAMUSCULAR | Status: AC
  Administered 2015-07-04 (×2): 20 mg via INTRAVENOUS
  Filled 2015-07-04 (×2): qty 2

## 2015-07-04 NOTE — Progress Notes (Signed)
Was given verbal order to place feeding tube this morning.  Inserted a #12 nasogastric tube and had pt x-rayed.  Dr. Laural Golden called after viewing x-ray and stated it was not the right tube.  MD, then, ordered a Dobbhoff tube #12 be placed and removed the nasogastric tube.

## 2015-07-04 NOTE — Progress Notes (Signed)
Discussed placement of small bore feeding tube at length with patient, wife at bedside.  Both verbalize understanding. Tube placed with no difficulty by Leonette Most RN, patient tolerated well.  Patient repositioned on right side.  Order for x-ray to confirm placement placed- to be done in one hour.  Tube secured to patients nose.

## 2015-07-04 NOTE — Care Management Note (Signed)
Case Management Note  Patient Details  Name: Andrew Watson MRN: [id number] Date of Birth: 27-May-1940  Expected Discharge Date:     07/05/2015             Expected Discharge Plan:  [place]  In-House Referral:  Nutrition  Discharge planning Services  CM Consult  Post Acute Care Choice:  Home Health Choice offered to:  Patient  DME Arranged:    DME Agency:     HH Arranged:  RN, Social Work [employer] Agency:  [place]  Status of Service:  In process, will continue to follow  Medicare Important Message Given:  Yes Date Medicare IM Given:    Medicare IM give by:    Date Additional Medicare IM Given:    Additional Medicare Important Message give by:     If discussed at [place] of Stay Meetings, dates discussed:  07/04/2015  Additional Comments: Pt will DC home with NGT and tube feedings. Pt has chosen [facility] for [facility] services and TF provider. Romualdo Bolk, of [facility], made aware of referral and will obtain pt info from chart and make visit with pt. Pt asking about going to SNF and CM explained that he does not have a skilled need and TF can be done at home. Pt made aware he has the choice of SNF but would have to pay privately, estimated monthly cost given to pt. Pt states he will attempt home with [facility], SW will be ordered in case pt is unable to care for self at home and needs placement.   Sherald Barge, RN 07/04/2015, 2:03 PM

## 2015-07-04 NOTE — Progress Notes (Addendum)
PROGRESS NOTE  Andrew Watson [id number] DOB: Jan 09, 1941 DOA: 06/26/2015 PCP: Wende Neighbors, MD  Brief Narrative: 48 yom admitted with complaints of abdominal pain. Due to further evaluation he was found to have acute pancreatitis with lipase greater than 3000. Hospitalization noted to be complicated with delirium tremens.   Assessment/Plan: 1. Alcoholic acute pancreatitis. Mild abdominal discomfort only. Relatively asymptomatic. Hemodynamics remained stable. Renal function preserved. Abdominal pelvic CT revealed worsening pancreatitis. Volume overload secondary to aggressive IV fluids is improving with IV diuresis. 2. Alcohol use disorder with acute withdrawal, delirium tremens. Resolved. 3. Anemia of acute illness. Hgb remains stable.  4. Chronic back pain. Stable.  5. Hypothyroidism .Conitnue Synthroid.  6. Hypokalemia 7. Atrial fibrillation. In sinus rhythm. Not anticoagulated. 8. Constipation. Will check for impaction. Plan for enema.     Remains relatively asymptomatic. Continue nothing by mouth, agree with postpyloric feeds. Pain appears to be controlled.  Volume overload improving. Continue IV diuresis.  DVT prophylaxis: Lovenox  Code Status: Full Family Communication: Daughter at bedside. Disposition Plan: Anticipate discharge in 2-3 days.   Murray Hodgkins, MD  [facility] Direct contact: [phone] --Via amion app OR  --[email or web address]; password [email or web address]  7PM-7AM contact night coverage as above 07/04/2015, 7:43 AM  LOS: 8 days   Consultants:  GI  Procedures:  None  Antimicrobials:  None  HPI/Subjective: Feeling a lot better. Abdomen is not as tight. Excellent diuresis. Scrotum has decreased in size. Complains of constipation. Fluid rentention improved.   Objective: Filed Vitals:   07/03/15 0444 07/03/15 1445 07/04/15 0127 07/04/15 0644  BP: 153/60 149/84 131/67 131/77  Pulse: 94 99 91 95  Temp: 97.7 F (36.5 C) 98.5 F (36.9 C) 97.9 F (36.6  C) 97.8 F (36.6 C)  TempSrc: Oral Axillary Oral Oral  Resp: 19 16 17 18   Height:      Weight:    114.851 kg (253 lb 3.2 oz)  SpO2: 100% 100% 99% 100%    Intake/Output Summary (Last 24 hours) at 07/04/15 0743 Last data filed at 07/04/15 0646  Gross per 24 hour  Intake 1038.75 ml  Output   2350 ml  Net -1311.25 ml     Filed Weights   06/26/15 1528 07/02/15 0617 07/04/15 0644  Weight: 104.509 kg (230 lb 6.4 oz) 113.4 kg (250 lb) 114.851 kg (253 lb 3.2 oz)    Exam: Constitutional:  . Appears calm and comfortable Respiratory:  . CTA bilaterally, no w/r/r.  . Respiratory effort normal. No retractions or accessory muscle use Cardiovascular:  . RRR, no m/r/g . 2+ bilateral lower extremity edema, improved with obvious skin wrinkling over the last 24 hours Abdomen:  . Abdomen distention improving.  . Mild lower abdominal tenderness to palpation no epigastric tenderness.  . GU: Scrotal edema has decreased. Penile edema has decreased. Testes are nontender. Hernias unremarkable. Psychiatric:  . judgement and insight appear normal . Mental status o Mood, affect appropriate  I have personally reviewed following labs and imaging studies:  Potassium 2.9  WBC 11.9  Hgb, stable. 10.7  Scheduled Meds: . enoxaparin (LOVENOX) injection  40 mg Subcutaneous Q24H  . levothyroxine  75 mcg Intravenous QAC breakfast  . polyethylene glycol  17 g Oral BID  . senna  1 tablet Oral QHS   Continuous Infusions: . dextrose 5 % and 0.45 % NaCl with KCl 20 mEq/L 50 mL/hr at 07/03/15 1932    Principal Problem:   Acute pancreatitis Active Problems:   Hypothyroidism   Alcohol use ([place])  Atrial fibrillation ([place])   LOS: 8 days   Time spent 25 minutes  By signing my name below, I, Rennis Harding attest that this documentation has been prepared under the direction and in the presence of Murray Hodgkins, MD Electronically signed: Rennis Harding 07/04/2015 10:43am   I personally  performed the services described in this documentation. All medical record entries made by the scribe were at my direction. I have reviewed the chart and agree that the record reflects my personal performance and is accurate and complete. Murray Hodgkins, MD

## 2015-07-04 NOTE — Progress Notes (Signed)
  Subjective:  Patient complains of pain primarily in right low quadrant. He denies shortness of breath. He is passing flatus but has not had a bowel movement.    Objective: Blood pressure 131/77, pulse 95, temperature 97.8 F (36.6 C), temperature source Oral, resp. rate 18, height 5\' 11"  (1.803 m), weight 253 lb 3.2 oz (114.851 kg), SpO2 100 %. Patient is alert and in no acute distress. Abdomen is distended with normal bowel sounds. Fullness/firm area noted in the right mid abdomen and right low quadrant. No organomegaly or masses. 1-2+ pitting edema both legs.  Urine output 2.45 L last 24 hours(he received 20 mg of furosemide last night)  Labs/studies Results:   Recent Labs  07/03/15 0543 07/04/15 0445  WBC 10.7* 11.9*  HGB 10.8* 10.7*  HCT 32.4* 32.0*  PLT 153 171    BMET   Recent Labs  07/02/15 0602 07/03/15 0543 07/04/15 0445  NA 137  --  135  K 2.9*  --  2.9*  CL 102  --  98*  CO2 29  --  32  GLUCOSE 105*  --  107*  BUN 17  --  11  CREATININE 0.76 0.75 0.81  CALCIUM 8.0*  --  7.6*    LFT   Recent Labs  07/02/15 0602 07/04/15 0445  PROT 6.1* 5.4*  ALBUMIN 2.6* 2.1*  AST 45* 47*  ALT 46 54  ALKPHOS 50 65  BILITOT 1.9* 1.3*      Assessment:  #1.Alcoholic pancreatitis complicated by bilateral pleural effusion and extensive intra-abdominal edema and minimal ascites. Patient made nothing by mouth yesterday. Feeding tube inserted. He will be started on nasojejunal feeding as soon as tube position confirmed. Please note that nursing staff placed 12 French NG tube by mistake and I removed it earlier today. #2. Mildly dilated bile duct noted on CT yesterday felt to be secondary to pancreatitis. Mildly elevated transaminases. No evidence of cholelithiasis or choledocholithiasis on imaging studies #3. Malnutrition. Serum albumin has dropped to 2.1. This will reserve once feeding begun.  Recommendations:  Metoclopramide 10 mg IV 1(he received another  dose earlier today). Pantoprazole 40 mg by mouth as directed with sips of water. Will begin enteral feeding as soon as tube position confirmed.

## 2015-07-05 ENCOUNTER — Inpatient Hospital Stay (HOSPITAL_COMMUNITY): Payer: Medicare Other

## 2015-07-05 LAB — COMPREHENSIVE METABOLIC PANEL
ALT: 49 U/L (ref 17–63)
AST: 49 U/L — AB (ref 15–41)
Albumin: 2.1 g/dL — ABNORMAL LOW (ref 3.5–5.0)
Alkaline Phosphatase: 63 U/L (ref 38–126)
Anion gap: 6 (ref 5–15)
BUN: 12 mg/dL (ref 6–20)
CHLORIDE: 95 mmol/L — AB (ref 101–111)
CO2: 33 mmol/L — ABNORMAL HIGH (ref 22–32)
Calcium: 7.7 mg/dL — ABNORMAL LOW (ref 8.9–10.3)
Creatinine, Ser: 0.8 mg/dL (ref 0.61–1.24)
Glucose, Bld: 115 mg/dL — ABNORMAL HIGH (ref 65–99)
POTASSIUM: 2.7 mmol/L — AB (ref 3.5–5.1)
Sodium: 134 mmol/L — ABNORMAL LOW (ref 135–145)
Total Bilirubin: 1.2 mg/dL (ref 0.3–1.2)
Total Protein: 5.5 g/dL — ABNORMAL LOW (ref 6.5–8.1)

## 2015-07-05 LAB — CBC
HCT: 31.6 % — ABNORMAL LOW (ref 39.0–52.0)
Hemoglobin: 10.5 g/dL — ABNORMAL LOW (ref 13.0–17.0)
MCH: 31.7 pg (ref 26.0–34.0)
MCHC: 33.2 g/dL (ref 30.0–36.0)
MCV: 95.5 fL (ref 78.0–100.0)
PLATELETS: 189 10*3/uL (ref 150–400)
RBC: 3.31 MIL/uL — ABNORMAL LOW (ref 4.22–5.81)
RDW: 13.8 % (ref 11.5–15.5)
WBC: 11.4 10*3/uL — AB (ref 4.0–10.5)

## 2015-07-05 LAB — LIPASE, BLOOD: Lipase: 27 U/L (ref 11–51)

## 2015-07-05 MED ORDER — FUROSEMIDE 10 MG/ML IJ SOLN
20.0000 mg | Freq: Two times a day (BID) | INTRAMUSCULAR | Status: AC
  Administered 2015-07-05 (×2): 20 mg via INTRAVENOUS
  Filled 2015-07-05 (×2): qty 2

## 2015-07-05 MED ORDER — OSMOLITE 1.5 CAL PO LIQD
1000.0000 mL | ORAL | Status: DC
  Administered 2015-07-05 – 2015-07-06 (×2): 1000 mL
  Filled 2015-07-05 (×4): qty 1000

## 2015-07-05 MED ORDER — POTASSIUM CHLORIDE 10 MEQ/100ML IV SOLN
10.0000 meq | INTRAVENOUS | Status: AC
  Administered 2015-07-05 (×4): 10 meq via INTRAVENOUS
  Filled 2015-07-05: qty 100

## 2015-07-05 MED ORDER — POTASSIUM CHLORIDE 10 MEQ/100ML IV SOLN: 10.0000 meq | INTRAVENOUS | Status: DC

## 2015-07-05 NOTE — Care Management Important Message (Signed)
Important Message  Patient Details  Name: Andrew Watson MRN: [id number] Date of Birth: 11-14-1940   Medicare Important Message Given:  Yes    Sherald Barge, RN 07/05/2015, 10:31 AM

## 2015-07-05 NOTE — Progress Notes (Signed)
Potassium level 2.7. Md paged with results no reply.

## 2015-07-05 NOTE — Progress Notes (Signed)
Initial Nutrition Assessment  DOCUMENTATION CODES:  Obesity unspecified  INTERVENTION:  Home TF recommendations:  5 cans Osmolite 1.5 administered over 12 hrs at rate of 99 mls/hr. Flush with 180 mls before and after starting pump.    Additional 60 mls Pro-mod BID (+400 kcal, 40 g Pro). Flush w/ 180 mls before and after each dose.   Tube feeding regimen provides 2178 kcal (100% of needs), 115 grams of protein, and 905 + 1080 mls from flushes  NUTRITION DIAGNOSIS:  Altered GI function related to Pancreatitis as evidenced by need for jejunal feeding.  GOAL:  Patient will meet greater than or equal to 90% of their needs  MONITOR:  Labs, Weight trends, I & O's, TF tolerance  REASON FOR ASSESSMENT:  Consult Enteral/tube feeding initiation and management  ASSESSMENT:  75 y/o Male PMHx hypothyroidism, gout who initially presented with severe abdominal pain. Pt was worked up for acute pancreatitis. Despite reported improvement in pain and PO intake, recent CT shows worsening pancreatitis. GI has recommend NJT for d/c. RD consulted for TF recommendations  Went over TF regimen with pt and his friend. He will receive cans at home, not liter bottles. Place 5 cans in bag, run at 99/hr x 12 hours. Discussed change in formula and why osmolite was chosen.   He is educated that during the day he needs to also administer 60 mls of promod BID to get his protein requirements. This should NOT be added to bag as can increase risk of clogging.   Emphasized the importance of flushing with the 180 mls before and after starting 12 hour feed as well as before/after each 60 mls of Promod.   Unsure if pt will be strictly NPO or will be allowed clears. RN in room states pt is allowed to take medications orally. As such, did not go over med administration via tube.   Instructed that if he is intolerant to regimen, manifested as severe diarrhea, nausea, distention, pain etc, he is to contact [facility].   [place] RN to  address logistics.   Pt did report his ubw is 225-230, not current weight.   Labs reviewed:   Recent Labs Lab 07/02/15 0602 07/03/15 0543 07/04/15 0445 07/05/15 0410  NA 137  --  135 134*  K 2.9*  --  2.9* 2.7*  CL 102  --  98* 95*  CO2 29  --  32 33*  BUN 17  --  11 12  CREATININE 0.76 0.75 0.81 0.80  CALCIUM 8.0*  --  7.6* 7.7*  MG  --   --  1.9  --   GLUCOSE 105*  --  107* 115*   Diet Order:  Diet NPO time specified  Skin:  Reviewed, no issues  Last BM:  5/31- pt on laxatives, stool softeners, enema  Height:  Ht Readings from Last 1 Encounters:  06/26/15 5\' 11"  (1.803 m)   Weight:  Wt Readings from Last 1 Encounters:  07/04/15 253 lb 3.2 oz (114.851 kg)   Wt Readings from Last 10 Encounters:  07/04/15 253 lb 3.2 oz (114.851 kg)  05/08/15 230 lb (104.327 kg)  05/04/15 230 lb (104.327 kg)  12/28/13 228 lb (103.42 kg)  12/13/13 228 lb (103.42 kg)  07/08/11 225 lb (102.059 kg)  05/15/11 225 lb (102.059 kg)  05/08/11 223 lb (101.152 kg)  Dosing weight: 230 lbs (104.327 kg)  Ideal Body Weight:  78.18 kg  BMI:  Body mass index is 35.33 kg/(m^2).  Estimated Nutritional Needs:  Kcal:  2000-2200 kcals (19-21 kcal/kg bw) Protein:  109-125 g (1.4-1.6 g/kg IBW) Fluid:  2.2 liters  EDUCATION NEEDS:  No education needs identified at this time  Burtis Junes RD, LDN, [employer] Nutrition Pager: [phone] 07/05/2015 10:07 AM

## 2015-07-05 NOTE — Care Management Note (Signed)
Case Management Note  Patient Details  Name: Andrew Watson MRN: [id number] Date of Birth: [birth year]  Expected Discharge Date:    07/07/2015              Expected Discharge Plan:  [place]  In-House Referral:  Nutrition  Discharge planning Services  CM Consult  Post Acute Care Choice:  Home Health Choice offered to:  Patient  DME Arranged:  Bedside commode DME Agency:  [place] Arranged:  RN, Social Work [facility] Agency:  [place]  Status of Service:  Completed, signed off  Medicare Important Message Given:  Yes Date Medicare IM Given:    Medicare IM give by:    Date Additional Medicare IM Given:    Additional Medicare Important Message give by:     If discussed at [place] of Stay Meetings, dates discussed:    Additional Comments: [place] home over weekend. Romualdo Bolk, of [facility], aware of [facility] referral and DME needs. Will obtain pt info from chart, have BSC delivered to room today. Pt going home with TF. Vaughan Basta will have TF and supplies delivered to home prior to DC and discharging RN will notify [facility] when pt leaves hospital for close home follow up. No further CM needs at this time.   Sherald Barge, RN 07/05/2015, 11:36 AM

## 2015-07-05 NOTE — Progress Notes (Signed)
PROGRESS NOTE  LADISLAO DORITY [id number] DOB: [birth year] DOA: 06/26/2015 PCP: Wende Neighbors, MD  Brief Narrative: 22 yom admitted with complaints of abdominal pain. Due to further evaluation he was found to have acute pancreatitis with lipase greater than 3000. Hospitalization noted to be complicated with delirium tremens.   Assessment/Plan: 1. Alcoholic acute pancreatitis. Clinically doing well. Tolerating tube feeds. Lipase within normal limits. Complete metabolic panel unremarkable. Recent CT did reveal worsening pancreatitis. 2. Volume overload secondary to aggressive IV fluids for pancreatitis. Continue to improve with aggressive diuresis. 3. Alcohol use disorder with acute withdrawal, delirium tremens. Resolved. 4. Anemia of acute illness. Hgb remains stable.  5. Hypokalemia, continue to replete. 6. Constipation. Bowels moving.   Continues to improve. Tolerates tube feedings. Replete potassium. Continue diuresis.   Anticipate discharge in 48 hours.   Discussed with Dr. Laural Golden.  DVT prophylaxis: Lovenox  Code Status: Full Family Communication: Girlfriend at bedside. Disposition Plan: Anticipate discharge in 2-3 days.   Murray Hodgkins, MD  [facility] Direct contact: [phone] --Via amion app OR  --[email or web address]; password [email or web address]  7PM-7AM contact night coverage as above 07/05/2015, 8:05 AM  LOS: 9 days   Consultants:  GI  Procedures:  None  Antimicrobials:  None  HPI/Subjective: Feels like he is improving. Swelling in legs and abdomen decreasing. Tolerating tube feeds.  Objective: Filed Vitals:   07/04/15 2019 07/04/15 2125 07/05/15 0502 07/05/15 0738  BP: 133/65 131/66 142/67   Pulse: 89 87 97   Temp: 98.1 F (36.7 C) 98.4 F (36.9 C) 98.4 F (36.9 C)   TempSrc: Oral Oral Oral   Resp:  20 20   Height:      Weight:      SpO2: 99% 99% 98% 93%    Intake/Output Summary (Last 24 hours) at 07/05/15 0805 Last data filed at 07/05/15 0630  Gross per 24 hour  Intake 1092.5 ml  Output   4125 ml  Net -3032.5 ml     Filed Weights   06/26/15 1528 07/02/15 0617 07/04/15 0644  Weight: 104.509 kg (230 lb 6.4 oz) 113.4 kg (250 lb) 114.851 kg (253 lb 3.2 oz)    Exam: Constitutional:  . Appears calm and comfortable Respiratory:  . CTA bilaterally, no w/r/r.  . Respiratory effort normal. No retractions or accessory muscle use Cardiovascular:  . RRR, no m/r/g . 2-3+ bilateral lower extremity edema, improved  Abdomen:  . Abdomen distention Stable   . GU: Scrotal edema continues to decrease. Penile edema continues to decrease. Testes are nontender.  Psychiatric:  . judgement and insight appear normal . Mental status o Mood, affect appropriate  I have personally reviewed following labs and imaging studies:  Potassium 2.7  WBC 11.4  Hgb 10.5, stable  Scheduled Meds: . enoxaparin (LOVENOX) injection  40 mg Subcutaneous Q24H  . feeding supplement (JEVITY 1.2 CAL)  1,000 mL Per Tube Q24H  . levothyroxine  75 mcg Intravenous QAC breakfast  . milk and molasses  1 enema Rectal Once  . pantoprazole  40 mg Oral Daily  . polyethylene glycol  17 g Oral BID  . senna  1 tablet Oral QHS   Continuous Infusions: . dextrose 5 % and 0.45% NaCl 50 mL/hr at 07/05/15 0344    Principal Problem:   Acute pancreatitis Active Problems:   Hypothyroidism   Alcohol use ([facility])   Atrial fibrillation ([facility])   LOS: 9 days   Time spent 15 minutes  By signing my name below, I, Rennis Harding attest that  this documentation has been prepared under the direction and in the presence of Murray Hodgkins, MD Electronically signed: Rennis Harding 07/05/2015 1:26pm   I personally performed the services described in this documentation. All medical record entries made by the scribe were at my direction. I have reviewed the chart and agree that the record reflects my personal performance and is accurate and complete. Murray Hodgkins, MD

## 2015-07-05 NOTE — Progress Notes (Signed)
  Subjective:  Patient states he has a couple of bowel movements. He denies melena or rectal bleeding. He continues to complain of pain in right low quadrant of his abdomen. States some of the same as yesterday. He denies chest pain or shortness of breath.  Objective: Blood pressure 142/67, pulse 97, temperature 98.4 F (36.9 C), temperature source Oral, resp. rate 20, height 5\' 11"  (1.803 m), weight 253 lb 3.2 oz (114.851 kg), SpO2 93 %. Patient is alert and in no acute distress. No neck masses or thyromegaly noted. Lungs are clear to auscultation. Abdomen is full with normal bowel sounds. He has mild tenderness in right low quadrant and right mid abdomen with abdomen is tense/tight. No discrete masses or organomegaly noted. He has 2+ pitting edema involving both legs.  Urine output 4225 mL last 24 hours.  Labs/studies Results:   Recent Labs  07/03/15 0543 07/04/15 0445 07/05/15 0410  WBC 10.7* 11.9* 11.4*  HGB 10.8* 10.7* 10.5*  HCT 32.4* 32.0* 31.6*  PLT 153 171 189    BMET   Recent Labs  07/03/15 0543 07/04/15 0445 07/05/15 0410  NA  --  135 134*  K  --  2.9* 2.7*  CL  --  98* 95*  CO2  --  32 33*  GLUCOSE  --  107* 115*  BUN  --  11 12  CREATININE 0.75 0.81 0.80  CALCIUM  --  7.6* 7.7*    LFT   Recent Labs  07/04/15 0445 07/05/15 0410  PROT 5.4* 5.5*  ALBUMIN 2.1* 2.1*  AST 47* 49*  ALT 54 49  ALKPHOS 65 63  BILITOT 1.3* 1.2    Feeding tube is at due to no jejunal flexure. Nursing staff advice to push the tube by another 5 cm.  Assessment:  #1.Severe acute pancreatitis felt to be secondary to alcohol. RLQ abdominal pain secondary to accumulation of fluid in right low quadrant and pelvic region. Patient was begun on nasoantral feeding last evening. He is presently on Jevity 1.2. #2. Hypokalemia. He is receiving IV KCl. #3. Malnutrition. Serum albumin is 2.1. Now that he is on nasoantral feeding this should gradually  reverse/improved.    Recommendations:  Will change feeding to Osmolite 1.5 as recommended by  Mr.Blake Divine RD. Target rate days 100 mL per hour.

## 2015-07-05 NOTE — Progress Notes (Signed)
5 BMs today. Miralax Dcd.

## 2015-07-06 ENCOUNTER — Inpatient Hospital Stay (HOSPITAL_COMMUNITY): Payer: Medicare Other

## 2015-07-06 LAB — BASIC METABOLIC PANEL
ANION GAP: 6 (ref 5–15)
BUN: 13 mg/dL (ref 6–20)
CALCIUM: 7.6 mg/dL — AB (ref 8.9–10.3)
CO2: 31 mmol/L (ref 22–32)
CREATININE: 0.74 mg/dL (ref 0.61–1.24)
Chloride: 97 mmol/L — ABNORMAL LOW (ref 101–111)
GFR calc Af Amer: >60 mL/min (ref >60)
GLUCOSE: 129 mg/dL — AB (ref 65–99)
Potassium: 3.1 mmol/L — ABNORMAL LOW (ref 3.5–5.1)
Sodium: 134 mmol/L — ABNORMAL LOW (ref 135–145)

## 2015-07-06 MED ORDER — FUROSEMIDE 10 MG/ML IJ SOLN
40.0000 mg | Freq: Once | INTRAMUSCULAR | Status: DC
  Filled 2015-07-06: qty 4

## 2015-07-06 MED ORDER — OXYCODONE HCL 5 MG PO TABS: 5.0000 mg | ORAL_TABLET | ORAL | Status: DC | PRN

## 2015-07-06 MED ORDER — ONDANSETRON HCL 4 MG/2ML IJ SOLN
4.0000 mg | Freq: Once | INTRAMUSCULAR | Status: AC
  Administered 2015-07-06: 4 mg via INTRAVENOUS
  Filled 2015-07-06: qty 2

## 2015-07-06 MED ORDER — LEVOTHYROXINE SODIUM 75 MCG PO TABS
150.0000 ug | ORAL_TABLET | Freq: Every day | ORAL | Status: DC
  Administered 2015-07-07: 150 ug via ORAL
  Filled 2015-07-06: qty 2

## 2015-07-06 MED ORDER — DIPHENHYDRAMINE HCL 25 MG PO CAPS
25.0000 mg | ORAL_CAPSULE | Freq: Once | ORAL | Status: AC
  Administered 2015-07-06: 25 mg via ORAL
  Filled 2015-07-06: qty 1

## 2015-07-06 MED ORDER — FUROSEMIDE 10 MG/ML IJ SOLN
40.0000 mg | Freq: Once | INTRAMUSCULAR | Status: AC
  Administered 2015-07-06: 40 mg via INTRAVENOUS
  Filled 2015-07-06: qty 4

## 2015-07-06 MED ORDER — POTASSIUM CHLORIDE 20 MEQ/15ML (10%) PO SOLN
40.0000 meq | Freq: Once | ORAL | Status: AC
  Administered 2015-07-06: 40 meq via ORAL
  Filled 2015-07-06: qty 30

## 2015-07-06 NOTE — Progress Notes (Signed)
Patient c/o of continued nausea after administration of Zofran @ 2014. Dr Laural Golden called  With PT status orders given  to give additional dose of Zofran. Tube feeding to be dropped to [facility]. Monitor patient for the next  4 hrs for c/o nausea, if  No reports of nausea feedings to be re-started @ 69ml/hr RN to  follow orders as given

## 2015-07-06 NOTE — Progress Notes (Signed)
  PROGRESS NOTE  Andrew Watson [id number] DOB: 02/02/1940 DOA: 06/26/2015 PCP: Wende Neighbors, MD  Brief Narrative: 34 yom admitted with complaints of abdominal pain. Due to further evaluation he was found to have acute pancreatitis with lipase greater than 3000. Hospitalization noted to be complicated with delirium tremens.   Assessment/Plan: 1. Alcoholic acute pancreatitis. Remains asymptomatic. Tolerating tube feeds. 2. Volume overload secondary to aggressive IV fluids for pancreatitis. Improving with diuresis. 3. Alcohol use disorder with acute withdrawal, resolved. 4. Anemia of acute illness, stable. 5. Hypokalemia, improving. 6. Constipation, resolved.    Continues to improve. Continue tube feeds.  Increase Lasix, continue diuresis. Continue to wean oxygen.  Murray Hodgkins, MD  [facility] Direct contact: [phone] --Via amion app OR  --[email or web address]; password [email or web address]  7PM-7AM contact night coverage as above 07/06/2015, 1:27 PM  LOS: 10 days   Consultants:  GI  Procedures:  None  Antimicrobials:  None  HPI/Subjective: Feels better. Less abdominal pain.  Objective: Filed Vitals:   07/05/15 1300 07/05/15 2006 07/05/15 2233 07/06/15 0724  BP: 135/65  135/71 131/67  Pulse: 88  93 81  Temp: 98.3 F (36.8 C)  98.6 F (37 C) 98.6 F (37 C)  TempSrc: Oral  Oral Oral  Resp: 20  20 20   Height:      Weight:      SpO2: 96% 94% 92% 98%    Intake/Output Summary (Last 24 hours) at 07/06/15 1327 Last data filed at 07/06/15 0700  Gross per 24 hour  Intake 703.33 ml  Output    300 ml  Net 403.33 ml     Filed Weights   06/26/15 1528 07/02/15 0617 07/04/15 0644  Weight: 104.509 kg (230 lb 6.4 oz) 113.4 kg (250 lb) 114.851 kg (253 lb 3.2 oz)    Exam: Constitutional:  . Appears calm and comfortable Respiratory:  . CTA bilaterally, no w/r/r.  . Respiratory effort normal. No retractions or accessory muscle use Cardiovascular:  . RRR, no  m/r/g . 2+ bilateral extremity edema. Penile edema has resolved. Scrotal edema continues to resolve. Abdomen:  . Abdomen appears normal .  Psychiatric:  . judgement and insight appear normal . Mental status o Mood, affect appropriate   I have personally reviewed following labs and imaging studies:  Potassium 3.1  Scheduled Meds: . enoxaparin (LOVENOX) injection  40 mg Subcutaneous Q24H  . levothyroxine  75 mcg Intravenous QAC breakfast  . [START ON 07/07/2015] levothyroxine  150 mcg Oral QAC breakfast  . pantoprazole  40 mg Oral Daily   Continuous Infusions: . feeding supplement (OSMOLITE 1.5 CAL) 1,000 mL (07/05/15 1656)    Principal Problem:   Acute pancreatitis Active Problems:   Hypothyroidism   Alcohol use ([facility])   Atrial fibrillation ([facility])   LOS: 10 days   Time spent 10 minutes

## 2015-07-06 NOTE — Progress Notes (Signed)
  Subjective:  Patient feels better. He states he is requiring less pain medication. He does complain of pain and right groin when he moves his leg. He needed 3 doses of morphine yesterday and he is only taken 2 today. He states he had 10 bowel movements in the last day and a half. Most of his stools are small volume and pasty. No melena or rectal bleeding reported. He has been ambulating in the hall couple of times a day without any difficulty.    Objective: Blood pressure 131/67, pulse 81, temperature 98.6 F (37 C), temperature source Oral, resp. rate 20, height 5\' 11"  (1.803 m), weight 253 lb 3.2 oz (114.851 kg), SpO2 98 %. Patient is alert and in no acute distress. Auscultation of lungs revealed few rales at both bases. Abdomen is full. Bowel sounds are normal. On palpation it soft but there is persistent firmness in right low quadrant and right mid abdomen laterally. He has 2+ pitting edema involving both legs.  Labs/studies Results:   Recent Labs  07/04/15 0445 07/05/15 0410  WBC 11.9* 11.4*  HGB 10.7* 10.5*  HCT 32.0* 31.6*  PLT 171 189    BMET   Recent Labs  07/04/15 0445 07/05/15 0410 07/06/15 0609  NA 135 134* 134*  K 2.9* 2.7* 3.1*  CL 98* 95* 97*  CO2 32 33* 31  GLUCOSE 107* 115* 129*  BUN 11 12 13   CREATININE 0.81 0.80 0.74  CALCIUM 7.6* 7.7* 7.6*    LFT   Recent Labs  07/04/15 0445 07/05/15 0410  PROT 5.4* 5.5*  ALBUMIN 2.1* 2.1*  AST 47* 49*  ALT 54 49  ALKPHOS 65 63  BILITOT 1.3* 1.2     Assessment:  #1. Severe alcoholic pancreatitis resulting in extensive abdominal inflammatory changes and edema and ascites. Oral feeding was stopped 3 days ago and patient has been on nasoduodenal feeding for day 2. He is on all supplied 1.5 but not at target which is 100 ML per hour. Feeding tube tip was at duodeno-jejunal flexure. Hopefully it has passed into jejunum by now. Will obtain portable film today. He could be transitioned to by mouth pain  medication. #2. Hypokalemia. Serum potassium is coming up #3. Malnutrition secondary to acute illness with some third spacing secondary to profound hypoalbuminemia. #4. Borderline transaminases. He also had predominantly indirect hyperbilirubinemia which has resolved. Will repeat LFTs with a.m. lab.   Recommendations:  Transition to oral medications. He could be switched back to oxycodone if all right with Dr. Sarajane Jews. DC Senna. Portable abdominal film to confirm that feeding tube tip is in jejunum. LFTs with a.m. lab.

## 2015-07-06 NOTE — Progress Notes (Signed)
CM faxed paperwork to [facility] for review and processing.  Pending d/c

## 2015-07-07 ENCOUNTER — Inpatient Hospital Stay (HOSPITAL_COMMUNITY): Payer: Medicare Other

## 2015-07-07 LAB — CBC
HCT: 35.1 % — ABNORMAL LOW (ref 39.0–52.0)
Hemoglobin: 11.6 g/dL — ABNORMAL LOW (ref 13.0–17.0)
MCH: 31.9 pg (ref 26.0–34.0)
MCHC: 33 g/dL (ref 30.0–36.0)
MCV: 96.4 fL (ref 78.0–100.0)
Platelets: 264 10*3/uL (ref 150–400)
RBC: 3.64 MIL/uL — ABNORMAL LOW (ref 4.22–5.81)
RDW: 13.8 % (ref 11.5–15.5)
WBC: 8.7 10*3/uL (ref 4.0–10.5)

## 2015-07-07 LAB — BASIC METABOLIC PANEL
ANION GAP: 6 (ref 5–15)
BUN: 15 mg/dL (ref 6–20)
CALCIUM: 8.1 mg/dL — AB (ref 8.9–10.3)
CO2: 32 mmol/L (ref 22–32)
Chloride: 98 mmol/L — ABNORMAL LOW (ref 101–111)
Creatinine, Ser: 0.91 mg/dL (ref 0.61–1.24)
GFR calc Af Amer: >60 mL/min (ref >60)
Glucose, Bld: 112 mg/dL — ABNORMAL HIGH (ref 65–99)
Potassium: 3.4 mmol/L — ABNORMAL LOW (ref 3.5–5.1)
SODIUM: 136 mmol/L (ref 135–145)

## 2015-07-07 LAB — HEPATIC FUNCTION PANEL
ALT: 66 U/L — ABNORMAL HIGH (ref 17–63)
AST: 62 U/L — AB (ref 15–41)
Albumin: 2.2 g/dL — ABNORMAL LOW (ref 3.5–5.0)
Alkaline Phosphatase: 58 U/L (ref 38–126)
BILIRUBIN DIRECT: 0.3 mg/dL (ref 0.1–0.5)
Indirect Bilirubin: 0.5 mg/dL (ref 0.3–0.9)
Total Bilirubin: 0.8 mg/dL (ref 0.3–1.2)
Total Protein: 6.3 g/dL — ABNORMAL LOW (ref 6.5–8.1)

## 2015-07-07 LAB — C DIFFICILE QUICK SCREEN W PCR REFLEX
C DIFFICILE (CDIFF) INTERP: NEGATIVE
C DIFFICILE (CDIFF) TOXIN: NEGATIVE
C DIFFICLE (CDIFF) ANTIGEN: NEGATIVE

## 2015-07-07 MED ORDER — KCL IN DEXTROSE-NACL 20-5-0.45 MEQ/L-%-% IV SOLN
INTRAVENOUS | Status: DC
  Administered 2015-07-07 – 2015-07-09 (×5): via INTRAVENOUS

## 2015-07-07 MED ORDER — DIPHENHYDRAMINE HCL 25 MG PO CAPS
25.0000 mg | ORAL_CAPSULE | Freq: Once | ORAL | Status: AC
  Administered 2015-07-07: 25 mg via ORAL
  Filled 2015-07-07: qty 1

## 2015-07-07 MED ORDER — FUROSEMIDE 10 MG/ML IJ SOLN
40.0000 mg | Freq: Once | INTRAMUSCULAR | Status: AC
  Administered 2015-07-07: 40 mg via INTRAVENOUS
  Filled 2015-07-07: qty 4

## 2015-07-07 MED ORDER — POTASSIUM CHLORIDE 2 MEQ/ML IV SOLN: INTRAVENOUS | Status: DC

## 2015-07-07 NOTE — Progress Notes (Signed)
  Subjective:  Patient reports intermittent nausea. He experienced nausea first last evening when he was walking and vomited but 100 mL of bilious fluid. Vomitus did not appear to be hospitalized per nursing staff.  He denies abdominal pain. He's had 3 small pasty stools today.  Objective: Blood pressure 125/70, pulse 85, temperature 98.3 F (36.8 C), temperature source Oral, resp. rate 18, height 5\' 11"  (1.803 m), weight 253 lb 3.2 oz (114.851 kg), SpO2 96 %. Patient appears comfortable. Conjunctiva is pink. Sclera is nonicteric Oropharyngeal mucosa is normal. No neck masses or thyromegaly noted. Cardiac exam with regular rhythm normal S1 and S2. No murmur or gallop noted. Lungs are clear to auscultation. Abdomen is full but less distended than it was 3 days ago. Bowel sounds are hypoactive. Palpation abdomen is soft. Fullness fullness in right mid and low quadrant is not as pronounced. 2+ pitting edema noted to both legs.  Labs/studies Results:   Recent Labs  07/05/15 0410 07/07/15 0524  WBC 11.4* 8.7  HGB 10.5* 11.6*  HCT 31.6* 35.1*  PLT 189 264    BMET   Recent Labs  07/05/15 0410 07/06/15 0609 07/07/15 0524  NA 134* 134* 136  K 2.7* 3.1* 3.4*  CL 95* 97* 98*  CO2 33* 31 32  GLUCOSE 115* 129* 112*  BUN 12 13 15   CREATININE 0.80 0.74 0.91  CALCIUM 7.7* 7.6* 8.1*    LFT   Recent Labs  07/05/15 0410 07/07/15 0524  PROT 5.5* 6.3*  ALBUMIN 2.1* 2.2*  AST 49* 62*  ALT 49 66*  ALKPHOS 63 58  BILITOT 1.2 0.8  BILIDIR  --  0.3  IBILI  --  0.5     Plain abdominal film yesterday afternoon revealed feeding tube tip to be at junction of duodenum and jejunum.  Assessment:  #1. Acute pancreatitis. Day 12 of hospitalization. Patient developed nausea and vomiting last night when feeding rate was increased from 60-70 mL per hour. Feeding is now on hold. Abdominal exam suggests ileus. He may have dislodged feeding tube any throughout. Serum magnesium was normal 3  days ago. #2. Hypokalemia. Serum potassium is near normal. #3. Malnutrition secondary to severe pancreatitis. #4. Mildly elevated transaminases but bilirubin and AP are normal. #5. Diarrhea felt to be secondary to polyethylene glycol which was discontinued 2 days ago and due to feeding. Will check C. Difficile.   Recommendations:  Patient begun on IV fluids since antral feeding has been discontinued. Acute abdominal series. Stool quick scan for C. Difficile. Will ask Dr. Thornton Papas to try to reposition tube so that it is in jejunum. Repeat LFTs in a.m.

## 2015-07-07 NOTE — Progress Notes (Signed)
PROGRESS NOTE  Andrew Watson [id number] DOB: September 28, 1940 DOA: 06/26/2015 PCP: Wende Neighbors, MD  Brief Narrative: 20 yom admitted with complaints of abdominal pain. Due to further evaluation he was found to have acute pancreatitis with lipase greater than 3000. Hospitalization noted to be complicated with delirium tremens.   Assessment/Plan: 1. Alcoholic acute pancreatitis. Had some nausea and vomiting overnight, likely related to feeds, has not required narcotics. Will repeat abdominal xray per GI to assess feeding tube position. 2. Volume overload secondary to aggressive IV fluids for pancreatitis. Rapidly improving with diuresis. 3. Alcohol use disorder with acute withdrawal, resolved. 4. Anemia of acute illness, improving. 5. Hypokalemia, improving. 6. Constipation, resolved.    As discussed with gastroenterology, repeat imaging to confirm tube still in place, if so will initiate slow feeds. Anticipate discharge home once tolerating feeds.  Replace potassium.  Continue diuresis and wean oxygen.   DVT prophylaxis: Lovenox  Code Status: Full Family Communication: Family at bedside. Disposition Plan: Anticipate discharge in 2-3 days.   Murray Hodgkins, MD  [facility] Direct contact: [phone] --Via amion app OR  --[email or web address]; password [email or web address]  7PM-7AM contact night coverage as above 07/07/2015, 6:43 AM  LOS: 11 days   Consultants:  GI  Procedures:  None  Antimicrobials:  None  HPI/Subjective: Had some vomiting last night and nausea this morning but feeling better now. Feels weak but denies any pain. Abdominal swelling appears improved.   Objective: Filed Vitals:   07/06/15 0724 07/06/15 1358 07/06/15 2102 07/07/15 0617  BP: 131/67 122/70 114/66 120/67  Pulse: 81 82 86 94  Temp: 98.6 F (37 C) 98.2 F (36.8 C) 98.1 F (36.7 C) 98.7 F (37.1 C)  TempSrc: Oral  Oral Oral  Resp: 20 20 18 18   Height:      Weight:      SpO2: 98% 96% 96% 97%      Intake/Output Summary (Last 24 hours) at 07/07/15 0643 Last data filed at 07/06/15 1900  Gross per 24 hour  Intake    400 ml  Output   1375 ml  Net   -975 ml     Filed Weights   06/26/15 1528 07/02/15 0617 07/04/15 0644  Weight: 104.509 kg (230 lb 6.4 oz) 113.4 kg (250 lb) 114.851 kg (253 lb 3.2 oz)    Exam: Constitutional:  . Appears calm and comfortable Respiratory:  . CTA bilaterally, no w/r/r.  . Respiratory effort normal. No retractions or accessory muscle use Cardiovascular:  . RRR, no m/r/g . 2+ bilateral LE edema but less so in the upper thighs. Penile edema has resolved. Scrotal edema minimal. Abdomen:  . Abdomen appears normal Psychiatric:  . judgement and insight appear normal . Mental status o Mood, affect appropriate   I have personally reviewed following labs and imaging studies:  CBC unremarkable. WBC now wnl.   Potassium improved at 3.4. Remainder of BMP unremarkable.  Albumin 2.2, AST 62, ALT 66  Scheduled Meds: . enoxaparin (LOVENOX) injection  40 mg Subcutaneous Q24H  . furosemide  40 mg Intravenous Once  . levothyroxine  150 mcg Oral QAC breakfast  . pantoprazole  40 mg Oral Daily   Continuous Infusions: . feeding supplement (OSMOLITE 1.5 CAL) 1,000 mL (07/06/15 1705)    Principal Problem:   Acute pancreatitis Active Problems:   Hypothyroidism   Alcohol use ([place])   Atrial fibrillation (HCC)   LOS: 11 days   Time spent 15 minutes   By signing my name below, I, Rosalie Doctor attest  that this documentation has been prepared under the direction and in the presence of Murray Hodgkins, MD Electronically signed: Rosalie Doctor, Scribe. 07/07/2015 10:05am  I personally performed the services described in this documentation. All medical record entries made by the scribe were at my direction. I have reviewed the chart and agree that the record reflects my personal performance and is accurate and complete. Murray Hodgkins,  MD

## 2015-07-07 NOTE — Progress Notes (Signed)
Patient appears to have some periods of anxiousness  at times. RN offered to move patient to a bed closer to the nurses station. Patient declined states that he prefers to stay in his current room. RN to continue to round frequently and monitor patient closely during scheduled shift

## 2015-07-08 ENCOUNTER — Other Ambulatory Visit (HOSPITAL_COMMUNITY): Payer: Medicare Other

## 2015-07-08 ENCOUNTER — Inpatient Hospital Stay (HOSPITAL_COMMUNITY): Payer: Medicare Other

## 2015-07-08 DIAGNOSIS — R112 Nausea with vomiting, unspecified: Secondary | ICD-10-CM

## 2015-07-08 LAB — BASIC METABOLIC PANEL
Anion gap: 5 (ref 5–15)
BUN: 15 mg/dL (ref 6–20)
CALCIUM: 8 mg/dL — AB (ref 8.9–10.3)
CO2: 30 mmol/L (ref 22–32)
CREATININE: 0.96 mg/dL (ref 0.61–1.24)
Chloride: 99 mmol/L — ABNORMAL LOW (ref 101–111)
GFR calc Af Amer: >60 mL/min (ref >60)
GFR calc non Af Amer: >60 mL/min (ref >60)
GLUCOSE: 110 mg/dL — AB (ref 65–99)
Potassium: 3.6 mmol/L (ref 3.5–5.1)
Sodium: 134 mmol/L — ABNORMAL LOW (ref 135–145)

## 2015-07-08 LAB — HEPATIC FUNCTION PANEL
ALK PHOS: 48 U/L (ref 38–126)
ALT: 58 U/L (ref 17–63)
AST: 51 U/L — AB (ref 15–41)
Albumin: 2.3 g/dL — ABNORMAL LOW (ref 3.5–5.0)
BILIRUBIN DIRECT: 0.3 mg/dL (ref 0.1–0.5)
BILIRUBIN TOTAL: 0.9 mg/dL (ref 0.3–1.2)
Indirect Bilirubin: 0.6 mg/dL (ref 0.3–0.9)
Total Protein: 5.9 g/dL — ABNORMAL LOW (ref 6.5–8.1)

## 2015-07-08 MED ORDER — IOPAMIDOL (ISOVUE-300) INJECTION 61%
100.0000 mL | Freq: Once | INTRAVENOUS | Status: AC | PRN
  Administered 2015-07-08: 100 mL via INTRAVENOUS

## 2015-07-08 MED ORDER — FAMOTIDINE IN NACL 20-0.9 MG/50ML-% IV SOLN
20.0000 mg | Freq: Two times a day (BID) | INTRAVENOUS | Status: DC
  Administered 2015-07-08 – 2015-07-11 (×8): 20 mg via INTRAVENOUS
  Filled 2015-07-08 (×9): qty 50

## 2015-07-08 MED ORDER — OSMOLITE 1.5 CAL PO LIQD
1000.0000 mL | ORAL | Status: DC
  Administered 2015-07-08: 1000 mL
  Filled 2015-07-08 (×2): qty 1000

## 2015-07-08 MED ORDER — MORPHINE SULFATE (PF) 2 MG/ML IV SOLN
2.0000 mg | INTRAVENOUS | Status: DC | PRN
Start: 2015-07-08 — End: 2015-07-09

## 2015-07-08 MED ORDER — METOCLOPRAMIDE HCL 5 MG/ML IJ SOLN
5.0000 mg | Freq: Four times a day (QID) | INTRAMUSCULAR | Status: DC
  Administered 2015-07-08 – 2015-07-09 (×4): 5 mg via INTRAVENOUS
  Filled 2015-07-08 (×4): qty 2

## 2015-07-08 MED ORDER — PROMETHAZINE HCL 25 MG/ML IJ SOLN
12.5000 mg | Freq: Four times a day (QID) | INTRAMUSCULAR | Status: DC | PRN
  Administered 2015-07-08 – 2015-07-09 (×3): 12.5 mg via INTRAVENOUS
  Filled 2015-07-08 (×4): qty 1

## 2015-07-08 MED ORDER — PROMETHAZINE HCL 25 MG/ML IJ SOLN
12.5000 mg | Freq: Once | INTRAMUSCULAR | Status: AC
  Administered 2015-07-08: 12.5 mg via INTRAVENOUS
  Filled 2015-07-08 (×2): qty 1

## 2015-07-08 MED ORDER — LEVOTHYROXINE SODIUM 100 MCG IV SOLR
75.0000 ug | Freq: Every day | INTRAVENOUS | Status: DC
  Administered 2015-07-08 – 2015-07-11 (×4): 75 ug via INTRAVENOUS
  Filled 2015-07-08 (×5): qty 5

## 2015-07-08 MED ORDER — FUROSEMIDE 10 MG/ML IJ SOLN: 40.0000 mg | Freq: Once | INTRAMUSCULAR | Status: DC

## 2015-07-08 NOTE — Progress Notes (Signed)
CT findings reviewed with patient and his friend [name]. There appears to be slightly more fluid in right low quadrant of abdomen and pelvis but it is not organized. Feeding tube tip is at junction of duodenum and jejunum and appears to be in good position. Gallbladder is more distended but no wall thickening noted. Small amount of fluid noted in the stomach. Will start patient on metoclopramide 5 mg IV every 6 hours. Discontinue ondansetron as it is not working Promethazine 12.5 mg IV every 6 when necessary. Watch closely for side effects either due to metoclopramide or promethazine

## 2015-07-08 NOTE — Progress Notes (Signed)
PROGRESS NOTE  Andrew Watson [id number] DOB: 02-27-1940 DOA: 06/26/2015 PCP: Wende Neighbors, MD  Brief Narrative: 5 yom admitted with complaints of abdominal pain. Due to further evaluation he was found to have acute pancreatitis with lipase greater than 3000. Hospitalization noted to be complicated with delirium tremens.   Assessment/Plan: 1. Alcoholic acute pancreatitis. Repeat abdominal xray revealed feeding tube in place. Still has mild nausea. Currently not tolerating tube feeds. 2. Nausea, etiology unclear. Repeat CT scan without acute findings. 3. Volume overload secondary to aggressive IV fluids for pancreatitis. Continues to improve with excellent diuresis. 4. Alcohol use disorder with acute withdrawal, resolved. 5. Anemia of acute illness, improving. 6. Hypokalemia, Resolved.  7. Constipation, resolved.   Not tolerating tube feeds, clinically remains stable but nutrition remains an issue. Ongoing nausea.  May end up needing IV nutrition.  Can likely change to oral diuretics 6/13. Has weaned off oxygen.   DVT prophylaxis: Lovenox  Code Status: Full Family Communication: Daughter at bedside. Disposition Plan: Home   Murray Hodgkins, MD  [facility] Direct contact: [phone] --Via amion app OR  --[email or web address]; password [email or web address]  7PM-7AM contact night coverage as above 07/08/2015, 7:56 AM  LOS: 12 days   Consultants:  GI  Procedures:  None  Antimicrobials:  None  HPI/Subjective: Still has mild nausea worsened with medications. Pain and swelling are both improving.  Objective: Filed Vitals:   07/07/15 1244 07/07/15 1337 07/07/15 2025 07/08/15 0523  BP: 125/70 112/67 128/67 131/70  Pulse: 85 84 81 89  Temp: 98.3 F (36.8 C) 98.2 F (36.8 C) 98.2 F (36.8 C) 97.7 F (36.5 C)  TempSrc: Oral  Oral Oral  Resp: 18 20 20 20   Height:      Weight:      SpO2: 96% 97% 97% 98%    Intake/Output Summary (Last 24 hours) at 07/08/15 0756 Last data  filed at 07/07/15 1900  Gross per 24 hour  Intake      0 ml  Output      0 ml  Net      0 ml     Filed Weights   06/26/15 1528 07/02/15 0617 07/04/15 0644  Weight: 104.509 kg (230 lb 6.4 oz) 113.4 kg (250 lb) 114.851 kg (253 lb 3.2 oz)    Exam: Constitutional:  . Appears calm, Mildly uncomfortable Respiratory:  . CTA bilaterally, no w/r/r.  . Respiratory effort normal. No retractions or accessory muscle use Cardiovascular:  . RRR, no m/r/g . 1+ + bilateral LE edema, thigh edema appears resolved. Abdomen:  . Abdomen appears normal, soft Psychiatric:  . judgement and insight appear normal . Mental status o Mood, affect appropriate   I have personally reviewed following labs and imaging studies:  Complete metabolic panel unremarkable, ALT is normalized, AST within normal limits.  C. Diff negative.   CXR feeding tube in place. Feeding tube in place.   Scheduled Meds: . enoxaparin (LOVENOX) injection  40 mg Subcutaneous Q24H  . levothyroxine  75 mcg Intravenous Daily  . pantoprazole  40 mg Oral Daily   Continuous Infusions: . dextrose 5 % and 0.45 % NaCl with KCl 20 mEq/L 100 mL/hr at 07/07/15 2058    Principal Problem:   Acute pancreatitis Active Problems:   Hypothyroidism   Alcohol use ([place])   Atrial fibrillation (HCC)   LOS: 12 days   Time spent: 20 minutes    By signing my name below, I, Rennis Harding attest that this documentation has been prepared under the  direction and in the presence of Murray Hodgkins, MD Electronically signed: Rennis Harding  07/08/2015 10:26am   I personally performed the services described in this documentation. All medical record entries made by the scribe were at my direction. I have reviewed the chart and agree that the record reflects my personal performance and is accurate and complete. Murray Hodgkins, MD

## 2015-07-08 NOTE — Care Management Important Message (Signed)
Important Message  Patient Details  Name: LEMONT DUEL MRN: [id number] Date of Birth: 04-26-1940   Medicare Important Message Given:  Yes    Sherald Barge, RN 07/08/2015, 1:39 PM

## 2015-07-08 NOTE — Progress Notes (Signed)
Dr. Laural Golden gave order to restart tube feeding Osmolite 1.5 at 25 ml/hr with a flush of 10 ml every 3 hours.

## 2015-07-08 NOTE — Progress Notes (Signed)
Dr Laural Golden notified of nausea and vomiting, return  call received. Instructed Rn " ok to hold tube feeding at this time" Rn will continue to monitor patient closely

## 2015-07-08 NOTE — Progress Notes (Signed)
  Subjective:  Patient states he could not tolerate tube feeding you when at a slower rate. He continues to complain of nausea and he states ondansetron is not helping. He states is making him fuzzy. He remains with loose stools. He says abdominal pain is mild located epigastrium and right low quadrant. He states pain is mild that he has not needed pain medication. He denies chest pain or shortness of breath.   Objective: Blood pressure 131/70, pulse 89, temperature 97.7 F (36.5 C), temperature source Oral, resp. rate 20, height 5\' 11"  (1.803 m), weight 253 lb 3.2 oz (114.851 kg), SpO2 98 %. Patient is alert and in no acute distress. Lungs are clear to auscultation. Abdomen is full. Bowel sounds are normal today. On palpation abdomen is soft with mild midepigastric tenderness. Fullness/firmness remains in right midabdomen and right low quadrant with minimal tenderness. 1+ pitting edema to both legs.  Labs/studies Results:   Recent Labs  07/07/15 0524  WBC 8.7  HGB 11.6*  HCT 35.1*  PLT 264    BMET   Recent Labs  07/06/15 0609 07/07/15 0524 07/08/15 0416  NA 134* 136 134*  K 3.1* 3.4* 3.6  CL 97* 98* 99*  CO2 31 32 30  GLUCOSE 129* 112* 110*  BUN 13 15 15   CREATININE 0.74 0.91 0.96  CALCIUM 7.6* 8.1* 8.0*    LFT   Recent Labs  07/07/15 0524 07/08/15 0416  PROT 6.3* 5.9*  ALBUMIN 2.2* 2.3*  AST 62* 51*  ALT 66* 58  ALKPHOS 58 48  BILITOT 0.8 0.9  BILIDIR 0.3 0.3  IBILI 0.5 0.6     C. difficile antigen and toxin are both negative.  Assessment:  #1. Intractable nausea unresponsive to ondansetron and cessation of tube feeding. Acute abdominal series yesterday did not dilated small or large bowel. I'm concerned that he may have developed pseudocyst causing this symptom. #2. Acute pancreatitis felt to be alcohol induced. Day 13 of hospitalization. Increase tightness complicated by increasing inflammatory changes and fluid collection in abdomen. He remains with  well-preserved renal function and he does not appear to have an infection. #3. Mildly elevated transaminases. AST and ALT are down today. Therefore biliary obstruction not an issue. #4. Diarrhea. Presumed to be due to enteral feeding. Diarrhea should improve now that feeding is on hold.  Patient's condition reviewed at length with his daughter Sharyn Lull and friend [name].  Recommendations:  Abdominopelvic CT with IV contrast today. I have asked Dr. Thornton Papas if he could advance feeding tube to just past duodenojejunal flexure. Promethazine 12.5 mg IV 1 to see if it helps with nausea. DC by mouth medications. Famotidine 20 mg IV every 12 hours. Levothyroxine 75 g IV every 24 hours. Morphine sulfate 2 mg IV every 4 when necessary. Hold todays dose of furosemide.

## 2015-07-08 NOTE — Progress Notes (Signed)
Patient continues to experience nausea and vomiting. Bile colored emesis noted. Zofran given @ 2239 with little or no change to waves of nausea. Rn unable to restart Osmolite  feeding at this time. Dr Laural Golden to be notified

## 2015-07-09 DIAGNOSIS — E43 Unspecified severe protein-calorie malnutrition: Secondary | ICD-10-CM

## 2015-07-09 LAB — CBC
HEMATOCRIT: 32.2 % — AB (ref 39.0–52.0)
HEMOGLOBIN: 10.8 g/dL — AB (ref 13.0–17.0)
MCH: 31.7 pg (ref 26.0–34.0)
MCHC: 33.5 g/dL (ref 30.0–36.0)
MCV: 94.4 fL (ref 78.0–100.0)
Platelets: 299 10*3/uL (ref 150–400)
RBC: 3.41 MIL/uL — ABNORMAL LOW (ref 4.22–5.81)
RDW: 13.8 % (ref 11.5–15.5)
WBC: 10.3 10*3/uL (ref 4.0–10.5)

## 2015-07-09 LAB — MAGNESIUM: MAGNESIUM: 2.1 mg/dL (ref 1.7–2.4)

## 2015-07-09 LAB — BASIC METABOLIC PANEL
Anion gap: 8 (ref 5–15)
BUN: 13 mg/dL (ref 6–20)
CHLORIDE: 102 mmol/L (ref 101–111)
CO2: 25 mmol/L (ref 22–32)
Calcium: 7.9 mg/dL — ABNORMAL LOW (ref 8.9–10.3)
Creatinine, Ser: 0.89 mg/dL (ref 0.61–1.24)
GFR calc Af Amer: >60 mL/min (ref >60)
GLUCOSE: 103 mg/dL — AB (ref 65–99)
POTASSIUM: 3.8 mmol/L (ref 3.5–5.1)
Sodium: 135 mmol/L (ref 135–145)

## 2015-07-09 LAB — ALBUMIN: Albumin: 2.3 g/dL — ABNORMAL LOW (ref 3.5–5.0)

## 2015-07-09 LAB — PHOSPHORUS: Phosphorus: 3.3 mg/dL (ref 2.5–4.6)

## 2015-07-09 MED ORDER — METOCLOPRAMIDE HCL 5 MG/ML IJ SOLN
5.0000 mg | Freq: Four times a day (QID) | INTRAMUSCULAR | Status: DC
  Administered 2015-07-09 – 2015-07-10 (×2): 5 mg via INTRAVENOUS
  Filled 2015-07-09 (×2): qty 2

## 2015-07-09 MED ORDER — FUROSEMIDE 10 MG/ML IJ SOLN
40.0000 mg | Freq: Once | INTRAMUSCULAR | Status: AC
  Administered 2015-07-09: 40 mg via INTRAVENOUS
  Filled 2015-07-09: qty 4

## 2015-07-09 MED ORDER — TRACE MINERALS CR-CU-MN-SE-ZN 10-1000-500-60 MCG/ML IV SOLN
INTRAVENOUS | Status: AC
  Administered 2015-07-09: 18:00:00 via INTRAVENOUS
  Filled 2015-07-09: qty 960

## 2015-07-09 MED ORDER — FAT EMULSION 20 % IV EMUL
120.0000 mL | INTRAVENOUS | Status: AC
  Administered 2015-07-09: 120 mL via INTRAVENOUS
  Filled 2015-07-09: qty 120

## 2015-07-09 MED ORDER — PROMETHAZINE HCL 25 MG/ML IJ SOLN
12.5000 mg | Freq: Once | INTRAMUSCULAR | Status: AC
  Administered 2015-07-09: 12.5 mg via INTRAVENOUS
  Filled 2015-07-09: qty 1

## 2015-07-09 MED ORDER — KCL IN DEXTROSE-NACL 20-5-0.45 MEQ/L-%-% IV SOLN
INTRAVENOUS | Status: AC
  Administered 2015-07-10: 09:00:00 via INTRAVENOUS

## 2015-07-09 NOTE — Progress Notes (Signed)
Patient continued to have nausea with vomited. Bile colored  emesis seen. Rn called Dr Laural Golden and informed him of patients poor toleration of  Tube feedings. Orders given to pull Doffnoff tube and stop feeding all together. Dr Laural Golden did state should vomiting continue place a NGT to low intermittent  Suction. Pt daughter request tube not be placed tonight. She states she wants  To be present  When NGT is placed and will be back by 7am on 07/09/15. Rn to comply with patient and family wishes. As of  the completion of this note patient has had no vomiting since pulling of tube,Patient  will continue to be monitored closely

## 2015-07-09 NOTE — Progress Notes (Signed)
PROGRESS NOTE  AABAN SPIELBERG [id number] DOB: 05-17-1940 DOA: 06/26/2015 PCP: Wende Neighbors, MD  Brief Narrative: 75 year old man presented with abdominal pain. Admitted for acute pancreatitis. Development encephalopathy, alcohol withdrawal which subsequently resolved. Clinically the patient rapidly improved with resolution of high lipase, however he developed abdominal pain with oral nutrition. Smallbore feeding tube was placed but despite multiple attempts the patient could not tolerate this secondary to nausea. Therefore 6/13 decision was made for PICC placement and TPN. Otherwise doing clinically well and would anticipate discharge in the next 48 hours.  Assessment/Plan: 1. Acute alcoholic pancreatitis. Lipase normalized. Asymptomatic. No nausea today now that 2 feeds have been discontinued. 2. Severe malnutrition. Plan for IV nutrition as below. 3. Volume overload secondary to aggressive IV fluids for pancreatitis. Rapidly improving. Change to oral diuretics in the morning. 4. Anemia of acute illness, stable. 5. Alcohol use disorder with acute withdrawal, resolved. 6. Constipation, resolved.   IV Lasix today. Repeat BMP in the morning. Can likely change to oral diuretic or discontinue altogether 6/14.  Will place PICC line today. Start TPN.   DVT prophylaxis: Lovenox  Code Status: Full Family Communication: Wife at bedside.  Disposition Plan: Home once improved, like 1-2 days  Murray Hodgkins, MD  [facility] Direct contact: [phone] --Via [place]  --[email or web address]; password [email or web address]  7PM-7AM contact night coverage as above 07/09/2015, 7:03 AM  LOS: 13 days   Consultants:  GI  Procedures:  None  Antimicrobials:  None  HPI/Subjective: Denies any nausea. Some right sided abd pain.   Objective: Filed Vitals:   07/07/15 2025 07/08/15 0523 07/08/15 2222 07/09/15 0626  BP: 128/67 131/70 133/66 121/73  Pulse: 81 89 84 78  Temp:  97.7 F (36.5 C) 98.1  F (36.7 C) 98 F (36.7 C)  TempSrc: Oral Oral Oral Oral  Resp: 20 20 20 20   Height:      Weight:      SpO2: 97% 98% 98% 98%    Intake/Output Summary (Last 24 hours) at 07/09/15 0703 Last data filed at 07/09/15 0600  Gross per 24 hour  Intake    950 ml  Output      1 ml  Net    949 ml     Filed Weights   06/26/15 1528 07/02/15 0617 07/04/15 0644  Weight: 104.509 kg (230 lb 6.4 oz) 113.4 kg (250 lb) 114.851 kg (253 lb 3.2 oz)    Exam:  General:  Appears calm and comfortable Cardiovascular: Soft 2/6 systolic murmer, no rub or gallop, 2+ BLE edema. Respiratory: CTA bilaterally, no w/r/r. Normal respiratory effort. Abdomen: soft, ntnd Neurologic: grossly non-focal.   I have personally reviewed following labs and imaging studies:  BMP unremarkable  CBC unremarkable  Blood sugar stable  Hgb stable  Scheduled Meds: . enoxaparin (LOVENOX) injection  40 mg Subcutaneous Q24H  . famotidine (PEPCID) IV  20 mg Intravenous Q12H  . feeding supplement (OSMOLITE 1.5 CAL)  1,000 mL Per Tube Q24H  . furosemide  40 mg Intravenous Once  . levothyroxine  75 mcg Intravenous Daily  . metoCLOPramide (REGLAN) injection  5 mg Intravenous Q6H   Continuous Infusions: . dextrose 5 % and 0.45 % NaCl with KCl 20 mEq/L 100 mL/hr at 07/08/15 2330    Principal Problem:   Acute pancreatitis Active Problems:   Hypothyroidism   Alcohol use ([facility])   Atrial fibrillation ([facility])   LOS: 13 days   Time spent: 20 minutes  By signing my name below, I, Rachell Cipro  Gawaluck, attest that this documentation has been prepared under the direction and in the presence of Jayd Forrey P. Sarajane Jews, MD. Electronically Signed: Delene Ruffini, Scribe.  07/09/2015 11:10am  I personally performed the services described in this documentation. All medical record entries made by the scribe were at my direction. I have reviewed the chart and agree that the record reflects my personal performance and is accurate and  complete. Murray Hodgkins, MD

## 2015-07-09 NOTE — Progress Notes (Addendum)
Nutrition Follow-up  DOCUMENTATION CODES:  Severe malnutrition in context of acute illness/injury, Obesity unspecified   Pt meets criteria for SEVERE MALNUTRITION in the context of ACUTE ILLNESS as evidenced by an estimated intake that met < 50% of estimated needs for > 5 days, and a loss of >2% bw in 1 week (may be fluid loss) .   INTERVENTION:  TPN per pharmacy, see recs below  Note. Per [facility] recommendations on TPN in acute pancreatitis. Mixed fuel source is reccommended. IVFE can be started with TG <400 (reccomend checking tg). Keep total Lipid <1 g/kg.   Glucose control of upmost importance. First Bag TPN <200 g dextrose if clinimix allows  Recommend IV MVI be added given prolonged period poor PO intake  NUTRITION DIAGNOSIS:  Altered GI function related to Pancreatitis as evidenced by Need for TPN  GOAL:  Patient will meet greater than or equal to 90% of their needs  MONITOR:  Labs, Weight trends, I & O's, TF tolerance  REASON FOR ASSESSMENT:  Consult New TPN/TNA  ASSESSMENT:  75 y/o Male PMHx hypothyroidism, gout who initially presented with severe abdominal pain. Pt was worked up for acute pancreatitis. Despite reported improvement in pain and PO intake, recent CT shows worsening pancreatitis. GI  initially recommend NJT for d/c. However, pt has been intolerant to TF. Now C/S for TPN  Intake hx: 5/31-6/4 npo 6/4-6/5 CL 6/5-6/6 FL (50-75% of 87mals) 6/6-6/8 HH (minimal-no documented intake) TF started 6/9-6/11. Pt appears to have only received TF for a couple days. It does not appear to have ever been advanced >50 cc/hr.  6/11-6-/13TF more or less on hold. Unable to tolerate slow rate 6/13: TPN C/S  No new dosing weight to use for Calorie/pro goals  RD interviewed pt regarding TF failure. He says pain didn't change with the initiation of TF, however, his nausea was simultaneous with the iniitiatian of the TF. This sounds to be related to the ileal brake feedback  mechanism which is not contraindication for EN.   Paged MD to discuss u[place] semi-elemental formula >TPN. However per MD, current concerns of pseudo obstruction/ileus. Notes increased edema.   Talked to pt about TPN. Explained how his BMs will change, but his urinary output should not. He may feel hungry even though he is receiving all the nutrition he needs. He asked about his diet order on D/C. Unknown at this point.   He asks if this will help with wt loss.     Current bed weight shows 104 kg. (229 LBS). Very mild edema.   Labs reviewed: Albumin: 2.3  Recent Labs Lab 07/04/15 0445  07/07/15 0524 07/08/15 0416 07/09/15 0412 07/09/15 0855  NA 135  < > 136 134* 135  --   K 2.9*  < > 3.4* 3.6 3.8  --   CL 98*  < > 98* 99* 102  --   CO2 32  < > 32 30 25  --   BUN 11  < > 15 15 13   --   CREATININE 0.81  < > 0.91 0.96 0.89  --   CALCIUM 7.6*  < > 8.1* 8.0* 7.9*  --   MG 1.9  --   --   --   --  2.1  PHOS  --   --   --   --   --  3.3  GLUCOSE 107*  < > 112* 110* 103*  --   < > = values in this interval not displayed.  Diet Order:  Diet NPO time specified Except for: Ice Chips, Sips with Meds TPN (CLINIMIX-E) Adult  Skin:  Reviewed, no issues  Last BM:  6/12  Height:  Ht Readings from Last 1 Encounters:  06/26/15 5' 11"  (1.803 m)   Weight:  Wt Readings from Last 1 Encounters:  07/04/15 253 lb 3.2 oz (114.851 kg)   Wt Readings from Last 10 Encounters:  07/04/15 253 lb 3.2 oz (114.851 kg)  05/08/15 230 lb (104.327 kg)  05/04/15 230 lb (104.327 kg)  12/28/13 228 lb (103.42 kg)  12/13/13 228 lb (103.42 kg)  07/08/11 225 lb (102.059 kg)  05/15/11 225 lb (102.059 kg)  05/08/11 223 lb (101.152 kg)  Dosing weight: Remains 230 lbs.   Ideal Body Weight:  78.18 kg  BMI:  Body mass index is 35.33 kg/(m^2).  Estimated Nutritional Needs:  Kcal:  2000-2200 kcals (19-21 kcal/kg bw) Protein:  109-125 g (1.4-1.6 g/kg IBW) Fluid:  2.2 liters  EDUCATION NEEDS:  No education  needs identified at this time  Burtis Junes RD, LDN, [facility] Pager: [phone] 07/09/2015 3:38 PM

## 2015-07-09 NOTE — Progress Notes (Signed)
PARENTERAL NUTRITION CONSULT NOTE - INITIAL  Pharmacy Consult for TPN Indication: pancreatitis, NPO  No Known Allergies  Patient Measurements: Height: 5\' 11"  (180.3 cm) Weight: 253 lb 3.2 oz (114.851 kg) IBW/kg (Calculated) : 75.3   Vital Signs: Temp: 98 F (36.7 C) (06/13 0626) Temp Source: Oral (06/13 0626) BP: 121/73 mmHg (06/13 0626) Pulse Rate: 78 (06/13 0626) Intake/Output from previous day: 06/12 0701 - 06/13 0700 In: 950 [I.V.:900; IV Piggyback:50] Out: 1 [Stool:1] Intake/Output from this shift:    Labs:  Recent Labs  07/07/15 0524 07/09/15 0412  WBC 8.7 10.3  HGB 11.6* 10.8*  HCT 35.1* 32.2*  PLT 264 299     Recent Labs  07/07/15 0524 07/08/15 0416 07/09/15 0412 07/09/15 0855  NA 136 134* 135  --   K 3.4* 3.6 3.8  --   CL 98* 99* 102  --   CO2 32 30 25  --   GLUCOSE 112* 110* 103*  --   BUN 15 15 13   --   CREATININE 0.91 0.96 0.89  --   CALCIUM 8.1* 8.0* 7.9*  --   MG  --   --   --  2.1  PHOS  --   --   --  3.3  PROT 6.3* 5.9*  --   --   ALBUMIN 2.2* 2.3* 2.3*  --   AST 62* 51*  --   --   ALT 66* 58  --   --   ALKPHOS 58 48  --   --   BILITOT 0.8 0.9  --   --   BILIDIR 0.3 0.3  --   --   IBILI 0.5 0.6  --   --    Estimated Creatinine Clearance: 92.4 mL/min (by C-G formula based on Cr of 0.89).   No results for input(s): GLUCAP in the last 72 hours.  Medical History: Past Medical History  Diagnosis Date  . Arthritis   . Gout   . Thyroid disease     Medications:  Prescriptions prior to admission  Medication Sig Dispense Refill Last Dose  . albuterol (PROVENTIL HFA;VENTOLIN HFA) 108 (90 Base) MCG/ACT inhaler Inhale 1-2 puffs into the lungs every 6 (six) hours as needed for wheezing or shortness of breath. 1 Inhaler 1 unknown  . chlorpheniramine-HYDROcodone (TUSSIONEX) 10-8 MG/5ML SUER TAKE 5 ML BY MOUTH EVERY 12 HOURS AS NEEDED FOR COUGH  0 unknown  . levothyroxine (SYNTHROID, LEVOTHROID) 150 MCG tablet Take 150 mcg by mouth  daily.  3 06/25/2015 at Unknown time  . loratadine (CLARITIN) 10 MG tablet Take 10 mg by mouth daily.   Past Week at Unknown time  . azithromycin (ZITHROMAX) 250 MG tablet Reported on 06/26/2015  0 Completed Course at Unknown time  . levofloxacin (LEVAQUIN) 500 MG tablet Take 1 tablet (500 mg total) by mouth daily. (Patient not taking: Reported on 06/26/2015) 7 tablet 0 Completed Course at Unknown time  . levothyroxine (SYNTHROID) 50 MCG tablet Take 1 tablet (50 mcg total) by mouth every morning. 30 tablet 3 07/08/2011 at 0715  . metoprolol succinate (TOPROL XL) 25 MG 24 hr tablet Take 1 tablet (25 mg total) by mouth daily. 30 tablet 3 Taking  . pantoprazole (PROTONIX) 40 MG tablet Take 1 tablet (40 mg total) by mouth daily. 30 tablet 3 Taking  . predniSONE (DELTASONE) 20 MG tablet 2 tabs po daily x 3 days (Patient not taking: Reported on 06/26/2015) 6 tablet 0 Completed Course at Unknown time  . traMADol Veatrice Bourbon)  50 MG tablet Take 1 tablet (50 mg total) by mouth every 6 (six) hours as needed. (Patient not taking: Reported on 06/26/2015) 15 tablet 0 Completed Course at Unknown time  . zolpidem (AMBIEN) 5 MG tablet Take 0.5 tablets (2.5 mg total) by mouth at bedtime as needed for sleep. 30 tablet 0 Past Month at Unknown    Insulin Requirements in the past 24 hours:  none  Current Nutrition:  NPO status IVF: D51/2NS with 20 mEq KCl @ 100 ml/hr  Assessment: 75 yo man to start TPN for pancreatitis. He was intolerant of tube feedings.  His baseline electrolytes are normal.  Corrected Ca 9.26.  His CBGs have been normal  Nutritional Goals:  Will f/u RD's recommendations  Plan:  Will start Clinimix E 5/15 at 40 ml/hr with lipids at 5 ml/hr F/u am labs, RD recommendations  Ayelet Gruenewald Poteet 07/09/2015,10:52 AM

## 2015-07-09 NOTE — Progress Notes (Signed)
Patient c/o nausea and vomiting.  Daughter at bedside.  Daughter states, "This is the worst that it has been."  Dr. Laural Golden paged.

## 2015-07-09 NOTE — Progress Notes (Signed)
  Subjective:  Patient feels fine this morning. He denies nausea or vomiting. He did have vomiting last night and naso-enteral feeding was begun. He complains of mild abdominal pain and right low quadrant. His daughter Sharyn Lull wants him to have blood tests for cancer pancreas and she also wonders if he should be checked for peptic ulcer disease.  Objective: Blood pressure 121/73, pulse 78, temperature 98 F (36.7 C), temperature source Oral, resp. rate 20, height 5\' 11"  (1.803 m), weight 253 lb 3.2 oz (114.851 kg), SpO2 98 %. Patient is alert and in no acute distress. Conjunctiva is pink. Sclera is nonicteric Oropharyngeal mucosa is normal. No neck masses or thyromegaly noted. Cardiac exam with regular rhythm normal S1 and S2. Grade 2/6 systolic ejection murmur best heard at left sternal border. Lungs are clear to auscultation. Abdomen is full. Bowel sounds are normal. Fullness and firmness remains in right low quadrant right mid abdomen laterally. He has minimal midepigastric tenderness.  1+ pitting edema to both legs.  Labs/studies Results:   Recent Labs  07/07/15 0524 07/09/15 0412  WBC 8.7 10.3  HGB 11.6* 10.8*  HCT 35.1* 32.2*  PLT 264 299    BMET   Recent Labs  07/07/15 0524 07/08/15 0416 07/09/15 0412  NA 136 134* 135  K 3.4* 3.6 3.8  CL 98* 99* 102  CO2 32 30 25  GLUCOSE 112* 110* 103*  BUN 15 15 13   CREATININE 0.91 0.96 0.89  CALCIUM 8.1* 8.0* 7.9*    LFT   Recent Labs  07/07/15 0524 07/08/15 0416 07/09/15 0412  PROT 6.3* 5.9*  --   ALBUMIN 2.2* 2.3* 2.3*  AST 62* 51*  --   ALT 66* 58  --   ALKPHOS 58 48  --   BILITOT 0.8 0.9  --   BILIDIR 0.3 0.3  --   IBILI 0.5 0.6  --       Assessment:  #1. Severe pancreatitis. Patient has been intolerant of naso-enteral feeding. Feeding tube was removed last night. He will need to be on parenteral nutrition. #2. Anemia secondary to acute illness. #3. Malnutrition. He has had few calories in the last 2  weeks.  Recommendations:  Discontinue metoclopramide. Consult for PICC line placement. Pharmacy consultation for TPN management. CA 19-9.

## 2015-07-09 NOTE — Progress Notes (Signed)
[facility] notified of PICC line placement order.  Will call back with ETA.  ETA 1200 07/09/2015.

## 2015-07-09 NOTE — Progress Notes (Signed)
Patient remains with intermittent nausea and vomiting. Need to rule out GB disease given the gallbladder was distended on CT from yesterday. HIDA scan in a.m. without CCK. TPN to be started this evening. Will drop IV rate to 15 mL per hour.

## 2015-07-09 NOTE — Progress Notes (Addendum)
Paged Dr. Laural Golden.  Dr. Laural Golden returned page and notified of patient's increased nausea and vomiting.  Dr. Laural Golden gave order for patient to receive a one time dose now of Phenergan 12.5 mg IV and start Reglan 5 mg IV every 6 hours.  Dr. Laural Golden also stated to give the Phenergan before the Reglan.  Also, verified IV fluid rate.  Dr. Laural Golden stated to continue the IV fluids at a rate of 50 ml/hr.  Night shift RN notified of above.

## 2015-07-10 ENCOUNTER — Inpatient Hospital Stay (HOSPITAL_COMMUNITY): Payer: Medicare Other

## 2015-07-10 ENCOUNTER — Encounter (HOSPITAL_COMMUNITY): Payer: Self-pay

## 2015-07-10 DIAGNOSIS — E038 Other specified hypothyroidism: Secondary | ICD-10-CM

## 2015-07-10 DIAGNOSIS — Z789 Other specified health status: Secondary | ICD-10-CM

## 2015-07-10 DIAGNOSIS — I480 Paroxysmal atrial fibrillation: Secondary | ICD-10-CM

## 2015-07-10 LAB — COMPREHENSIVE METABOLIC PANEL
ALBUMIN: 2.3 g/dL — AB (ref 3.5–5.0)
ALT: 50 U/L (ref 17–63)
ANION GAP: 5 (ref 5–15)
AST: 40 U/L (ref 15–41)
Alkaline Phosphatase: 40 U/L (ref 38–126)
BUN: 13 mg/dL (ref 6–20)
CHLORIDE: 100 mmol/L — AB (ref 101–111)
CO2: 28 mmol/L (ref 22–32)
Calcium: 8 mg/dL — ABNORMAL LOW (ref 8.9–10.3)
Creatinine, Ser: 0.86 mg/dL (ref 0.61–1.24)
GFR calc Af Amer: >60 mL/min (ref >60)
Glucose, Bld: 126 mg/dL — ABNORMAL HIGH (ref 65–99)
POTASSIUM: 3.7 mmol/L (ref 3.5–5.1)
Sodium: 133 mmol/L — ABNORMAL LOW (ref 135–145)
TOTAL PROTEIN: 6 g/dL — AB (ref 6.5–8.1)
Total Bilirubin: 0.9 mg/dL (ref 0.3–1.2)

## 2015-07-10 LAB — DIFFERENTIAL
BASOS ABS: 0 10*3/uL (ref 0.0–0.1)
BASOS PCT: 0 %
EOS ABS: 0.2 10*3/uL (ref 0.0–0.7)
Eosinophils Relative: 2 %
Lymphocytes Relative: 12 %
Lymphs Abs: 1.1 10*3/uL (ref 0.7–4.0)
Monocytes Absolute: 1 10*3/uL (ref 0.1–1.0)
Monocytes Relative: 11 %
NEUTROS ABS: 7 10*3/uL (ref 1.7–7.7)
NEUTROS PCT: 75 %

## 2015-07-10 LAB — CBC
HEMATOCRIT: 31.4 % — AB (ref 39.0–52.0)
Hemoglobin: 10.6 g/dL — ABNORMAL LOW (ref 13.0–17.0)
MCH: 31.7 pg (ref 26.0–34.0)
MCHC: 33.8 g/dL (ref 30.0–36.0)
MCV: 94 fL (ref 78.0–100.0)
PLATELETS: 341 10*3/uL (ref 150–400)
RBC: 3.34 MIL/uL — ABNORMAL LOW (ref 4.22–5.81)
RDW: 13.6 % (ref 11.5–15.5)
WBC: 9.3 10*3/uL (ref 4.0–10.5)

## 2015-07-10 LAB — TRIGLYCERIDES: TRIGLYCERIDES: 69 mg/dL (ref <150)

## 2015-07-10 LAB — PHOSPHORUS: PHOSPHORUS: 3.4 mg/dL (ref 2.5–4.6)

## 2015-07-10 LAB — MAGNESIUM: Magnesium: 2.2 mg/dL (ref 1.7–2.4)

## 2015-07-10 LAB — CANCER ANTIGEN 19-9: CA 19-9: 10 U/mL (ref 0–35)

## 2015-07-10 LAB — PREALBUMIN: PREALBUMIN: 7.4 mg/dL — AB (ref 18–38)

## 2015-07-10 MED ORDER — MORPHINE SULFATE (PF) 4 MG/ML IV SOLN
4.0000 mg | Freq: Once | INTRAVENOUS | Status: AC
  Administered 2015-07-10: 4 mg via INTRAVENOUS

## 2015-07-10 MED ORDER — FAT EMULSION 20 % IV EMUL
240.0000 mL | INTRAVENOUS | Status: DC
  Filled 2015-07-10: qty 250

## 2015-07-10 MED ORDER — TECHNETIUM TC 99M MEBROFENIN IV KIT
5.0000 | PACK | Freq: Once | INTRAVENOUS | Status: AC | PRN
  Administered 2015-07-10: 5.5 via INTRAVENOUS

## 2015-07-10 MED ORDER — TRACE MINERALS CR-CU-MN-SE-ZN 10-1000-500-60 MCG/ML IV SOLN
INTRAVENOUS | Status: DC
  Filled 2015-07-10: qty 1800

## 2015-07-10 MED ORDER — MORPHINE SULFATE (PF) 4 MG/ML IV SOLN
INTRAVENOUS | Status: AC
  Filled 2015-07-10: qty 1

## 2015-07-10 MED ORDER — KCL IN DEXTROSE-NACL 20-5-0.45 MEQ/L-%-% IV SOLN: INTRAVENOUS | Status: DC

## 2015-07-10 MED ORDER — FAT EMULSION 20 % IV EMUL
240.0000 mL | INTRAVENOUS | Status: AC
  Administered 2015-07-10: 240 mL via INTRAVENOUS
  Filled 2015-07-10: qty 250

## 2015-07-10 MED ORDER — TRACE MINERALS CR-CU-MN-SE-ZN 10-1000-500-60 MCG/ML IV SOLN: INTRAVENOUS | Status: DC

## 2015-07-10 MED ORDER — TRACE MINERALS CR-CU-MN-SE-ZN 10-1000-500-60 MCG/ML IV SOLN
INTRAVENOUS | Status: AC
  Administered 2015-07-10: 18:00:00 via INTRAVENOUS
  Filled 2015-07-10: qty 1800

## 2015-07-10 MED ORDER — FAT EMULSION 20 % IV EMUL: 240.0000 mL | INTRAVENOUS | Status: DC

## 2015-07-10 NOTE — Progress Notes (Signed)
PARENTERAL NUTRITION CONSULT NOTE   Pharmacy Consult for TPN Indication: pancreatitis, NPO  No Known Allergies  Patient Measurements: Height: 5\' 11"  (180.3 cm) Weight: 253 lb 3.2 oz (114.851 kg) IBW/kg (Calculated) : 75.3   Vital Signs: Temp: 98.2 F (36.8 C) (06/13 2200) Temp Source: Oral (06/13 2200) BP: 131/68 mmHg (06/13 2200) Pulse Rate: 75 (06/13 2200) Intake/Output from previous day: 06/13 0701 - 06/14 0700 In: 0  Out: 5 [Urine:4; Stool:1] Intake/Output from this shift:    Labs:  Recent Labs  07/09/15 0412 07/10/15 0406  WBC 10.3 9.3  HGB 10.8* 10.6*  HCT 32.2* 31.4*  PLT 299 341     Recent Labs  07/08/15 0416 07/09/15 0412 07/09/15 0855 07/10/15 0406  NA 134* 135  --  133*  K 3.6 3.8  --  3.7  CL 99* 102  --  100*  CO2 30 25  --  28  GLUCOSE 110* 103*  --  126*  BUN 15 13  --  13  CREATININE 0.96 0.89  --  0.86  CALCIUM 8.0* 7.9*  --  8.0*  MG  --   --  2.1 2.2  PHOS  --   --  3.3 3.4  PROT 5.9*  --   --  6.0*  ALBUMIN 2.3* 2.3*  --  2.3*  AST 51*  --   --  40  ALT 58  --   --  50  ALKPHOS 48  --   --  40  BILITOT 0.9  --   --  0.9  BILIDIR 0.3  --   --   --   IBILI 0.6  --   --   --   TRIG  --   --   --  69   Estimated Creatinine Clearance: 95.6 mL/min (by C-G formula based on Cr of 0.86).   No results for input(s): GLUCAP in the last 72 hours.  Medical History: Past Medical History  Diagnosis Date  . Arthritis   . Gout   . Thyroid disease     Medications:  Prescriptions prior to admission  Medication Sig Dispense Refill Last Dose  . albuterol (PROVENTIL HFA;VENTOLIN HFA) 108 (90 Base) MCG/ACT inhaler Inhale 1-2 puffs into the lungs every 6 (six) hours as needed for wheezing or shortness of breath. 1 Inhaler 1 unknown  . chlorpheniramine-HYDROcodone (TUSSIONEX) 10-8 MG/5ML SUER TAKE 5 ML BY MOUTH EVERY 12 HOURS AS NEEDED FOR COUGH  0 unknown  . levothyroxine (SYNTHROID, LEVOTHROID) 150 MCG tablet Take 150 mcg by mouth daily.   3 06/25/2015 at Unknown time  . loratadine (CLARITIN) 10 MG tablet Take 10 mg by mouth daily.   Past Week at Unknown time  . azithromycin (ZITHROMAX) 250 MG tablet Reported on 06/26/2015  0 Completed Course at Unknown time  . levofloxacin (LEVAQUIN) 500 MG tablet Take 1 tablet (500 mg total) by mouth daily. (Patient not taking: Reported on 06/26/2015) 7 tablet 0 Completed Course at Unknown time  . levothyroxine (SYNTHROID) 50 MCG tablet Take 1 tablet (50 mcg total) by mouth every morning. 30 tablet 3 07/08/2011 at 0715  . metoprolol succinate (TOPROL XL) 25 MG 24 hr tablet Take 1 tablet (25 mg total) by mouth daily. 30 tablet 3 Taking  . pantoprazole (PROTONIX) 40 MG tablet Take 1 tablet (40 mg total) by mouth daily. 30 tablet 3 Taking  . predniSONE (DELTASONE) 20 MG tablet 2 tabs po daily x 3 days (Patient not taking: Reported on 06/26/2015) 6 tablet  0 Completed Course at Unknown time  . traMADol (ULTRAM) 50 MG tablet Take 1 tablet (50 mg total) by mouth every 6 (six) hours as needed. (Patient not taking: Reported on 06/26/2015) 15 tablet 0 Completed Course at Unknown time  . zolpidem (AMBIEN) 5 MG tablet Take 0.5 tablets (2.5 mg total) by mouth at bedtime as needed for sleep. 30 tablet 0 Past Month at Unknown    Insulin Requirements in the past 24 hours:  none  Current Nutrition:  Clinimix E 5/15 @ 40 ml/hr and lipids @ 5 ml/hr IVF: D51/2NS with 20 mEq KCl @ 50 ml/hr  Assessment: 75 yo man to start TPN for pancreatitis. He was intolerant of tube feedings. TPN started last night and he is tolerating well. (see labs above) Clinimix E 5/15 at goal rate of 100 ml/hr with 20% lipids at 10 ml/hr will provide 120 gm protein and 2184 Kcal per day.  Nutritional Goals:  Kcal 2000-2200 Protein 109-125 Fluid 2.2 L   Plan:  Increase Clinimix E 5/15 to 75 ml/hr with lipids at 10 ml/hr Decrease IV rate to 25 ml/hr when TPN rate increased F/u am labs  Excell Seltzer Poteet 07/10/2015,8:33 AM

## 2015-07-10 NOTE — Progress Notes (Signed)
  Subjective:  Patient vomited intermittently during the night. He vomited is bilious material. This morning he has minimal nausea but feels a lot better. He states he was able to sleep. He has minimal discomfort in epigastric region and right low quadrant. He would like to try small amount of Jell-O.   Objective: Blood pressure 131/68, pulse 75, temperature 98.2 F (36.8 C), temperature source Oral, resp. rate 18, height 5\' 11"  (1.803 m), weight 253 lb 3.2 oz (114.851 kg), SpO2 96 %. Ration is alert and in no acute distress. Conjunctiva is pink. Sclera is nonicteric Oropharyngeal mucosa is normal. No neck masses or thyromegaly noted. Cardiac exam with regular rhythm normal S1 and S2. Systolic murmur is unchanged. Lungs are clear to auscultation. Abdomen is full. Bowel sounds are normal. On palpation is soft with mild midepigastric tenderness. Mild tenderness noted in right mid and low quadrant there is persistent firmness. No guarding. Trace LE edema around ankles.  Labs/studies Results:   Recent Labs  07/09/15 0412 07/10/15 0406  WBC 10.3 9.3  HGB 10.8* 10.6*  HCT 32.2* 31.4*  PLT 299 341    BMET   Recent Labs  07/08/15 0416 07/09/15 0412 07/10/15 0406  NA 134* 135 133*  K 3.6 3.8 3.7  CL 99* 102 100*  CO2 30 25 28   GLUCOSE 110* 103* 126*  BUN 15 13 13   CREATININE 0.96 0.89 0.86  CALCIUM 8.0* 7.9* 8.0*    LFT   Recent Labs  07/08/15 0416 07/09/15 0412 07/10/15 0406  PROT 5.9*  --  6.0*  ALBUMIN 2.3* 2.3* 2.3*  AST 51*  --  40  ALT 58  --  50  ALKPHOS 48  --  40  BILITOT 0.9  --  0.9  BILIDIR 0.3  --   --   IBILI 0.6  --   --     CA-19-9 is 10 Serum magnesium is 2.2 Phosphorus 3.4 Triglycerides 69  Assessment:  #1. Acute pancreatitis with multiple complications. He has extensive peripancreatic inflammatory changes as well as fluid collection and right low quadrant and right pelvis. Patient could not tolerate nasoenteral feeding. He was begun on  parenteral nutrition last evening. Electrolytes within normal range. #2. Nausea and vomiting either secondary to ileus or duodenal obstruction due to pancreatitis. Some relief with meclizine. HIDA scan to rule out acute cholecystitis.  Recommendations:  Sips of clear liquids. HIDA scan later today. If nausea and vomiting recurs will start him back on ondansetron on schedule.

## 2015-07-10 NOTE — Care Management Important Message (Signed)
Important Message  Patient Details  Name: Andrew Watson MRN: [id number] Date of Birth: 09-11-1940   Medicare Important Message Given:  Yes    Sherald Barge, RN 07/10/2015, 3:56 PM

## 2015-07-10 NOTE — Progress Notes (Signed)
PROGRESS NOTE    Andrew Watson  [id number] DOB: [birth year] DOA: 06/26/2015 PCP: Wende Neighbors, MD  Outpatient Specialists:    Brief Narrative:  28 yom presented with abd pain and was admitted for acute pancreatitis. He rapidly improved with resolution of elevated lipase, however, he developed abd pain with oral nutrition. Feeding tube was placed, but patient could not tolerate due to nausea. PICC line was subsequently placed and nausea resolved with discontinuation of feeding tube.   Assessment & Plan:   Principal Problem:   Acute pancreatitis Active Problems:   Hypothyroidism   Alcohol use ([place])   Atrial fibrillation ([place])  1. Acute alcoholic pancreatitis. Lipase has returned to normal limits. Patient is asymptomatic. Nausea has resolved since tube feeds have been discontinued. HIDA did not indicate cholecystitis. Continue to advance diet as tolerated 2. Severe malnutrition. PICC line has been placed to IV nutrition. Continue TPN. 3. Volume overload secondary to aggressive IV fluids for pancreatitis. Rapidly improving. Anticipate the patient should be able to self regulate at this point. 4. Anemia of acute illness, stable. 5. Alcohol use disorder with acute withdrawal, resolved. 6. Hypothyroidism. Continue synthroid. 7. Constipation, resolved.  DVT prophylaxis: Lovenox Code Status: Full Family Communication: Discussed with patient and daughter at the bedside Disposition Plan: Discharge home once improved   Consultants:   GI  Procedures:   none  Antimicrobials:  none    Subjective: Feeling great today. Nausea is improving. Tolerating Jell-O.  Objective: Filed Vitals:   07/08/15 0523 07/08/15 2222 07/09/15 0626 07/09/15 2200  BP: 131/70 133/66 121/73 131/68  Pulse: 89 84 78 75  Temp: 97.7 F (36.5 C) 98.1 F (36.7 C) 98 F (36.7 C) 98.2 F (36.8 C)  TempSrc: Oral Oral Oral Oral  Resp: 20 20 20 18   Height:      Weight:      SpO2: 98% 98% 98% 96%     Intake/Output Summary (Last 24 hours) at 07/10/15 0741 Last data filed at 07/09/15 1800  Gross per 24 hour  Intake      0 ml  Output      5 ml  Net     -5 ml   Filed Weights   06/26/15 1528 07/02/15 0617 07/04/15 0644  Weight: 104.509 kg (230 lb 6.4 oz) 113.4 kg (250 lb) 114.851 kg (253 lb 3.2 oz)    Examination:  General exam: Appears calm and comfortable  Respiratory system: Clear to auscultation. Respiratory effort normal. Cardiovascular system: S1 & S2 heard, RRR. No JVD, murmurs, rubs, gallops or clicks. 1+ pedal edema. Gastrointestinal system: Abdomen is nondistended, soft and nontender. No organomegaly or masses felt. Normal bowel sounds heard. Central nervous system: Alert and oriented. No focal neurological deficits. Extremities: Symmetric 5 x 5 power. Skin: No rashes, lesions or ulcers Psychiatry: Judgement and insight appear normal. Mood & affect appropriate.     Data Reviewed: I have personally reviewed following labs and imaging studies  CBC:  Recent Labs Lab 07/04/15 0445 07/05/15 0410 07/07/15 0524 07/09/15 0412 07/10/15 0406  WBC 11.9* 11.4* 8.7 10.3 9.3  NEUTROABS  --   --   --   --  7.0  HGB 10.7* 10.5* 11.6* 10.8* 10.6*  HCT 32.0* 31.6* 35.1* 32.2* 31.4*  MCV 95.8 95.5 96.4 94.4 94.0  PLT 171 189 264 299 [id number]   Basic Metabolic Panel:  Recent Labs Lab 07/04/15 0445  07/06/15 0609 07/07/15 0524 07/08/15 0416 07/09/15 0412 07/09/15 0855 07/10/15 0406  NA 135  < >  134* 136 134* 135  --  133*  K 2.9*  < > 3.1* 3.4* 3.6 3.8  --  3.7  CL 98*  < > 97* 98* 99* 102  --  100*  CO2 32  < > 31 32 30 25  --  28  GLUCOSE 107*  < > 129* 112* 110* 103*  --  126*  BUN 11  < > 13 15 15 13   --  13  CREATININE 0.81  < > 0.74 0.91 0.96 0.89  --  0.86  CALCIUM 7.6*  < > 7.6* 8.1* 8.0* 7.9*  --  8.0*  MG 1.9  --   --   --   --   --  2.1 2.2  PHOS  --   --   --   --   --   --  3.3 3.4  < > = values in this interval not displayed. GFR: Estimated  Creatinine Clearance: 95.6 mL/min (by C-G formula based on Cr of 0.86). Liver Function Tests:  Recent Labs Lab 07/04/15 0445 07/05/15 0410 07/07/15 0524 07/08/15 0416 07/09/15 0412 07/10/15 0406  AST 47* 49* 62* 51*  --  40  ALT 54 49 66* 58  --  50  ALKPHOS 65 63 58 48  --  40  BILITOT 1.3* 1.2 0.8 0.9  --  0.9  PROT 5.4* 5.5* 6.3* 5.9*  --  6.0*  ALBUMIN 2.1* 2.1* 2.2* 2.3* 2.3* 2.3*    Recent Labs Lab 07/05/15 0410  LIPASE 27  Lipid Profile:  Recent Labs  07/10/15 0406  TRIG 69   Urine analysis:    Component Value Date/Time   COLORURINE YELLOW 06/26/2015 0909   APPEARANCEUR CLEAR 06/26/2015 0909   LABSPEC 1.020 06/26/2015 0909   PHURINE 5.5 06/26/2015 0909   GLUCOSEU NEGATIVE 06/26/2015 0909   HGBUR TRACE* 06/26/2015 0909   BILIRUBINUR NEGATIVE 06/26/2015 0909   KETONESUR NEGATIVE 06/26/2015 0909   PROTEINUR NEGATIVE 06/26/2015 0909   NITRITE NEGATIVE 06/26/2015 0909   LEUKOCYTESUR NEGATIVE 06/26/2015 0909   Sepsis Labs: @LABRCNTIP (procalcitonin:4,lacticidven:4)  ) Recent Results (from the past 240 hour(s))  C difficile quick scan w PCR reflex     Status: None   Collection Time: 07/07/15  3:12 PM  Result Value Ref Range Status   C Diff antigen NEGATIVE NEGATIVE Final   C Diff toxin NEGATIVE NEGATIVE Final   C Diff interpretation Negative for toxigenic C. difficile  Final     Radiology Studies: Ct Abdomen Pelvis W Contrast  07/08/2015  CLINICAL DATA:  Pancreatitis. Nausea. Abdominal distention with right lower quadrant pain. EXAM: CT ABDOMEN AND PELVIS WITH CONTRAST TECHNIQUE: Multidetector CT imaging of the abdomen and pelvis was performed using the standard protocol following bolus administration of intravenous contrast. CONTRAST:  138mL ISOVUE-300 IOPAMIDOL (ISOVUE-300) INJECTION 61% COMPARISON:  07/03/2015. FINDINGS: Lower chest: Small right and trace left pleural effusions. Associated right lower lobe opacity, likely atelectasis. Coronary  atherosclerosis in the LAD. Atherosclerotic calcifications of the aortic root. Hepatobiliary: Liver is within normal limits. Mild gallbladder distention. Common duct is mildly prominent. No cholelithiasis or choledocholithiasis. Pancreas: Peripancreatic inflammatory changes, predominantly along the uncinate process, compatible with acute pancreatitis. Fluid extends inferiorly along the anterior right perirenal space, right pericolic gutter, retroperitoneum along the iliopsoas bursa, and into a right inguinal hernia. When compared to the prior, there has been expected slight progression with minimal thickening/enhancing rim medially (series 2/ image 81). Additionally, there is mild hyperdense fluid layering dependently (series 2/image 58), possibly reflecting  hemorrhage or proteinaceous debris. No unexpected or significant interval change. No large hematoma or pseudoaneurysm formation. Spleen: Within normal limits. Adrenals/Urinary Tract: Adrenal glands are within normal limits. 5 mm nonobstructing left lower pole renal calculus (series 2/ image 48). 13 mm anterior interpolar left renal cyst (series 2/ image 46). Right kidney is within normal limits. No hydronephrosis. Bladder is within normal limits. Stomach/Bowel: Stomach is grossly unremarkable. Enteric tube course cysts to the ligament of Treitz. Inflammatory changes/ fluid along the proximal/ mid duodenum. No evidence of bowel obstruction. Normal appendix (series 2/image 70). Vascular/Lymphatic: No evidence of abdominal aortic aneurysm. Mild atherosclerotic calcifications. No suspicious abdominopelvic lymphadenopathy. Reproductive: Prostate is grossly unremarkable. Other: Additional trace presacral fluid (series 2/ image 84). Musculoskeletal: Degenerative changes of the visualized thoracolumbar spine. IMPRESSION: Acute pancreatitis, as described above. Expected mild progression of peripancreatic fluid extending into the right retroperitoneum along the iliopsoas  bursa, as described above. No unexpected or significant interval change. No large hematoma or pseudoaneurysm formation. No evidence of bowel obstruction.  Normal appendix. Electronically Signed   By: Julian Hy M.D.   On: 07/08/2015 12:46    Scheduled Meds: . enoxaparin (LOVENOX) injection  40 mg Subcutaneous Q24H  . famotidine (PEPCID) IV  20 mg Intravenous Q12H  . levothyroxine  75 mcg Intravenous Daily  . metoCLOPramide (REGLAN) injection  5 mg Intravenous Q6H   Continuous Infusions: . dextrose 5 % and 0.45 % NaCl with KCl 20 mEq/L    . [name] KitchenTPN (CLINIMIX-E) Adult 40 mL/hr at 07/09/15 1827   And  . fat emulsion 120 mL (07/09/15 1829)     LOS: 14 days    Time spent: 25 minutes   Kathie Dike, MD [facility] Pager [phone]  If 7PM-7AM, please contact night-coverage [email or web address] Password [facility] 07/10/2015, 7:41 AM

## 2015-07-10 NOTE — Progress Notes (Addendum)
HIDA results reviewed with patient and his family members. Cystic duct is patent. He is tolerating sips of clear liquids and feels much better today. No more nausea and/or vomiting. Will discontinue metoclopramide.

## 2015-07-10 NOTE — Progress Notes (Signed)
Dr. Laural Golden called to check on pt.  Relayed to MD pt resting at this time; refused midnight Reglan.  Suggested morphine/called hospitalist if pt needs rest.  Also stated ok to place 75F NG tube if pt compliant for continued N/V.

## 2015-07-11 LAB — COMPREHENSIVE METABOLIC PANEL
ALT: 44 U/L (ref 17–63)
ANION GAP: 7 (ref 5–15)
AST: 36 U/L (ref 15–41)
Albumin: 2.4 g/dL — ABNORMAL LOW (ref 3.5–5.0)
Alkaline Phosphatase: 43 U/L (ref 38–126)
BUN: 13 mg/dL (ref 6–20)
CALCIUM: 7.9 mg/dL — AB (ref 8.9–10.3)
CHLORIDE: 99 mmol/L — AB (ref 101–111)
CO2: 27 mmol/L (ref 22–32)
Creatinine, Ser: 0.8 mg/dL (ref 0.61–1.24)
Glucose, Bld: 105 mg/dL — ABNORMAL HIGH (ref 65–99)
Potassium: 3.7 mmol/L (ref 3.5–5.1)
SODIUM: 133 mmol/L — AB (ref 135–145)
Total Bilirubin: 0.8 mg/dL (ref 0.3–1.2)
Total Protein: 6 g/dL — ABNORMAL LOW (ref 6.5–8.1)

## 2015-07-11 LAB — MAGNESIUM: MAGNESIUM: 2.2 mg/dL (ref 1.7–2.4)

## 2015-07-11 LAB — PHOSPHORUS: PHOSPHORUS: 3.4 mg/dL (ref 2.5–4.6)

## 2015-07-11 MED ORDER — KCL IN DEXTROSE-NACL 20-5-0.9 MEQ/L-%-% IV SOLN
INTRAVENOUS | Status: DC
  Administered 2015-07-11: 09:00:00 via INTRAVENOUS

## 2015-07-11 MED ORDER — TRACE MINERALS CR-CU-MN-SE-ZN 10-1000-500-60 MCG/ML IV SOLN
INTRAVENOUS | Status: DC
  Administered 2015-07-11: 18:00:00 via INTRAVENOUS
  Filled 2015-07-11 (×2): qty 2400

## 2015-07-11 MED ORDER — PANTOPRAZOLE SODIUM 40 MG PO TBEC
40.0000 mg | DELAYED_RELEASE_TABLET | Freq: Every day | ORAL | Status: DC
  Administered 2015-07-12: 40 mg via ORAL
  Filled 2015-07-11: qty 1

## 2015-07-11 MED ORDER — LEVOTHYROXINE SODIUM 75 MCG PO TABS
150.0000 ug | ORAL_TABLET | Freq: Every day | ORAL | Status: DC
  Administered 2015-07-12: 150 ug via ORAL
  Filled 2015-07-11: qty 2

## 2015-07-11 MED ORDER — FAT EMULSION 20 % IV EMUL
240.0000 mL | INTRAVENOUS | Status: DC
  Administered 2015-07-11: 240 mL via INTRAVENOUS
  Filled 2015-07-11: qty 240

## 2015-07-11 NOTE — Progress Notes (Signed)
PROGRESS NOTE    Andrew Watson  [id number] DOB: August 19, 1940 DOA: 06/26/2015 PCP: Wende Neighbors, MD  Outpatient Specialists:    Brief Narrative:  47 yom presented with abd pain and was admitted for acute pancreatitis. He rapidly improved with resolution of elevated lipase, however, he developed abd pain with oral nutrition. Feeding tube was placed, but patient could not tolerate due to nausea. PICC line was subsequently placed and nausea resolved with discontinuation of feeding tube.   Assessment & Plan: Principal Problem:   Acute pancreatitis Active Problems:   Hypothyroidism   Alcohol use ([place])   Atrial fibrillation ([place])  1. Acute alcoholic pancreatitis. Lipase has returned to normal limits. Patient is asymptomatic. Nausea has resolved since tube feeds have been discontinued. HIDA did not indicate cholecystitis. Able to tolerate clear liquids.   2. Severe malnutrition. PICC line has been placed to IV nutrition. Continue TPN. 3. Volume overload secondary to aggressive IV fluids for pancreatitis. Contiues to improve. Anticipate the patient should be able to self regulate at this point. 4. Anemia of acute illness, stable. 5. Alcohol use disorder with acute withdrawal, resolved. 6. Hypothyroidism. Continue synthroid. 7. Constipation, resolved.  DVT prophylaxis: Lovenox Code Status: Full Family Communication: Discussed with patient and daughter at the bedside Disposition Plan: Discharge home  In 24 hours.    Consultants:   GI  Procedures:   none  Antimicrobials:  none    Subjective: Feeling well. Was able to tolerate diet without difficulty. Nausea and vomiting have resolved. Swelling in legs have improved. Mild lower abdominal pain that is well controlled with pain mangament.   Objective: Filed Vitals:   07/09/15 0626 07/09/15 2200 07/10/15 2234 07/11/15 0643  BP: 121/73 131/68 120/64 123/65  Pulse: 78 75 87 83  Temp: 98 F (36.7 C) 98.2 F (36.8 C) 98.1 F  (36.7 C) 98.3 F (36.8 C)  TempSrc: Oral Oral Oral Oral  Resp: 20 18 18 18   Height:      Weight:      SpO2: 98% 96% 96% 97%   No intake or output data in the 24 hours ending 07/11/15 1222 Filed Weights   06/26/15 1528 07/02/15 0617 07/04/15 0644  Weight: 104.509 kg (230 lb 6.4 oz) 113.4 kg (250 lb) 114.851 kg (253 lb 3.2 oz)    Examination:  General exam: Appears calm and comfortable  Respiratory system: Clear to auscultation. Respiratory effort normal. Cardiovascular system: S1 & S2 heard, RRR. No JVD, murmurs, rubs, gallops or clicks. 1+  pedal edema. Gastrointestinal system: Abdomen is nondistended, soft and nontender. No organomegaly or masses felt. Normal bowel sounds heard. Central nervous system: Alert and oriented. No focal neurological deficits. Psychiatry: Judgement and insight appear normal. Mood & affect appropriate.   Data Reviewed: I have personally reviewed following labs and imaging studies  CBC:  Recent Labs Lab 07/05/15 0410 07/07/15 0524 07/09/15 0412 07/10/15 0406  WBC 11.4* 8.7 10.3 9.3  NEUTROABS  --   --   --  7.0  HGB 10.5* 11.6* 10.8* 10.6*  HCT 31.6* 35.1* 32.2* 31.4*  MCV 95.5 96.4 94.4 94.0  PLT 189 264 299 [id number]   Basic Metabolic Panel:  Recent Labs Lab 07/07/15 0524 07/08/15 0416 07/09/15 0412 07/09/15 0855 07/10/15 0406 07/11/15 0421  NA 136 134* 135  --  133* 133*  K 3.4* 3.6 3.8  --  3.7 3.7  CL 98* 99* 102  --  100* 99*  CO2 32 30 25  --  28 27  GLUCOSE  112* 110* 103*  --  126* 105*  BUN 15 15 13   --  13 13  CREATININE 0.91 0.96 0.89  --  0.86 0.80  CALCIUM 8.1* 8.0* 7.9*  --  8.0* 7.9*  MG  --   --   --  2.1 2.2 2.2  PHOS  --   --   --  3.3 3.4 3.4   GFR: Estimated Creatinine Clearance: 102.8 mL/min (by C-G formula based on Cr of 0.8). Liver Function Tests:  Recent Labs Lab 07/05/15 0410 07/07/15 0524 07/08/15 0416 07/09/15 0412 07/10/15 0406 07/11/15 0421  AST 49* 62* 51*  --  40 36  ALT 49 66* 58  --  50 44    ALKPHOS 63 58 48  --  40 43  BILITOT 1.2 0.8 0.9  --  0.9 0.8  PROT 5.5* 6.3* 5.9*  --  6.0* 6.0*  ALBUMIN 2.1* 2.2* 2.3* 2.3* 2.3* 2.4*    Recent Labs Lab 07/05/15 0410  LIPASE 27  Lipid Profile:  Recent Labs  07/10/15 0406  TRIG 69   Urine analysis:    Component Value Date/Time   COLORURINE YELLOW 06/26/2015 0909   APPEARANCEUR CLEAR 06/26/2015 0909   LABSPEC 1.020 06/26/2015 0909   PHURINE 5.5 06/26/2015 0909   GLUCOSEU NEGATIVE 06/26/2015 0909   HGBUR TRACE* 06/26/2015 0909   BILIRUBINUR NEGATIVE 06/26/2015 0909   KETONESUR NEGATIVE 06/26/2015 0909   PROTEINUR NEGATIVE 06/26/2015 0909   NITRITE NEGATIVE 06/26/2015 0909   LEUKOCYTESUR NEGATIVE 06/26/2015 0909   Sepsis Labs: @LABRCNTIP (procalcitonin:4,lacticidven:4)  ) Recent Results (from the past 240 hour(s))  C difficile quick scan w PCR reflex     Status: None   Collection Time: 07/07/15  3:12 PM  Result Value Ref Range Status   C Diff antigen NEGATIVE NEGATIVE Final   C Diff toxin NEGATIVE NEGATIVE Final   C Diff interpretation Negative for toxigenic C. difficile  Final     Radiology Studies: Nm Hepatobiliary Including Gb  07/10/2015  CLINICAL DATA:  Abdominal pain, nausea, vomiting, acute pancreatitis EXAM: NUCLEAR MEDICINE HEPATOBILIARY IMAGING TECHNIQUE: Sequential images of the abdomen were obtained out to 60 minutes following intravenous administration of radiopharmaceutical. Imaging was continued for an additional 30 minutes at which time the patient received 4 mg of morphine IV and imaging was continued for another 30 minutes. RADIOPHARMACEUTICALS:  5.5 mCi Tc-83m  Choletec IV COMPARISON:  CT abdomen and pelvis 07/08/2015 FINDINGS: Prompt clearance of tracer by bloodstream indicating normal hepatocellular function. Mildly delayed concentration and excretion of tracer into the biliary tree. Photopenic defect at RIGHT lobe of liver corresponding to mildly distended gallbladder on preceding CT. CBD patent  with tracer identified within the bowel at 60 minutes. Beginning at 48 minutes, questionable visualization of tracer within the gallbladder. Imaging through 90 minutes failed to demonstrate additional significant localization of tracer within the gallbladder. Following morphine, increased tracer accumulation within the gallbladder is identified compatible with patent cystic duct. IMPRESSION: Patent biliary tree with tracer seen within small bowel confirming CBD patency and within the gallbladder confirming cystic duct patency. Electronically Signed   By: Lavonia Dana M.D.   On: 07/10/2015 14:45    Scheduled Meds: . enoxaparin (LOVENOX) injection  40 mg Subcutaneous Q24H  . famotidine (PEPCID) IV  20 mg Intravenous Q12H  . levothyroxine  75 mcg Intravenous Daily   Continuous Infusions: . dextrose 5 % and 0.9 % NaCl with KCl 20 mEq/L 10 mL/hr at 07/11/15 0915  . [name] KitchenTPN (CLINIMIX-E) Adult 66  mL/hr at 07/10/15 1747   And  . fat emulsion 240 mL (07/10/15 1747)  . [name] KitchenTPN (CLINIMIX-E) Adult     And  . fat emulsion       LOS: 15 days    Time spent: 25 minutes   Kathie Dike, MD [facility] Pager [phone]  If 7PM-7AM, please contact night-coverage [email or web address] Password [email or web address] 07/11/2015, 12:22 PM    By signing my name below, I, Rennis Harding, attest that this documentation has been prepared under the direction and in the presence of Kathie Dike, MD. Electronically signed: Rennis Harding, Scribe. 07/11/2015 1:19pm   I, Dr. Kathie Dike, personally performed the services described in this documentaiton. All medical record entries made by the scribe were at my direction and in my presence. I have reviewed the chart and agree that the record reflects my personal performance and is accurate and complete  Kathie Dike, MD, 07/11/2015 1:29 PM

## 2015-07-11 NOTE — Progress Notes (Signed)
PARENTERAL NUTRITION CONSULT NOTE   Pharmacy Consult for TPN Indication: pancreatitis  No Known Allergies  Patient Measurements: Height: 5\' 11"  (180.3 cm) Weight: 253 lb 3.2 oz (114.851 kg) IBW/kg (Calculated) : 75.3   Vital Signs: Temp: 98.3 F (36.8 C) (06/15 0643) Temp Source: Oral (06/15 0643) BP: 123/65 mmHg (06/15 0643) Pulse Rate: 83 (06/15 0643) Intake/Output from previous day: 06/14 0701 - 06/15 0700 In: 0  Out: 2 [Urine:1; Stool:1] Intake/Output from this shift:    Labs:  Recent Labs  07/09/15 0412 07/10/15 0406  WBC 10.3 9.3  HGB 10.8* 10.6*  HCT 32.2* 31.4*  PLT 299 341     Recent Labs  07/09/15 0412 07/09/15 0855 07/10/15 0406 07/11/15 0421  NA 135  --  133* 133*  K 3.8  --  3.7 3.7  CL 102  --  100* 99*  CO2 25  --  28 27  GLUCOSE 103*  --  126* 105*  BUN 13  --  13 13  CREATININE 0.89  --  0.86 0.80  CALCIUM 7.9*  --  8.0* 7.9*  MG  --  2.1 2.2 2.2  PHOS  --  3.3 3.4 3.4  PROT  --   --  6.0* 6.0*  ALBUMIN 2.3*  --  2.3* 2.4*  AST  --   --  40 36  ALT  --   --  50 44  ALKPHOS  --   --  40 43  BILITOT  --   --  0.9 0.8  PREALBUMIN  --   --  7.4*  --   TRIG  --   --  69  --    Estimated Creatinine Clearance: 102.8 mL/min (by C-G formula based on Cr of 0.8).   No results for input(s): GLUCAP in the last 72 hours.  Medical History: Past Medical History  Diagnosis Date  . Arthritis   . Gout   . Thyroid disease     Medications:  Prescriptions prior to admission  Medication Sig Dispense Refill Last Dose  . albuterol (PROVENTIL HFA;VENTOLIN HFA) 108 (90 Base) MCG/ACT inhaler Inhale 1-2 puffs into the lungs every 6 (six) hours as needed for wheezing or shortness of breath. 1 Inhaler 1 unknown  . chlorpheniramine-HYDROcodone (TUSSIONEX) 10-8 MG/5ML SUER TAKE 5 ML BY MOUTH EVERY 12 HOURS AS NEEDED FOR COUGH  0 unknown  . levothyroxine (SYNTHROID, LEVOTHROID) 150 MCG tablet Take 150 mcg by mouth daily.  3 06/25/2015 at Unknown time  .  loratadine (CLARITIN) 10 MG tablet Take 10 mg by mouth daily.   Past Week at Unknown time  . azithromycin (ZITHROMAX) 250 MG tablet Reported on 06/26/2015  0 Completed Course at Unknown time  . levofloxacin (LEVAQUIN) 500 MG tablet Take 1 tablet (500 mg total) by mouth daily. (Patient not taking: Reported on 06/26/2015) 7 tablet 0 Completed Course at Unknown time  . levothyroxine (SYNTHROID) 50 MCG tablet Take 1 tablet (50 mcg total) by mouth every morning. 30 tablet 3 07/08/2011 at 0715  . metoprolol succinate (TOPROL XL) 25 MG 24 hr tablet Take 1 tablet (25 mg total) by mouth daily. 30 tablet 3 Taking  . pantoprazole (PROTONIX) 40 MG tablet Take 1 tablet (40 mg total) by mouth daily. 30 tablet 3 Taking  . predniSONE (DELTASONE) 20 MG tablet 2 tabs po daily x 3 days (Patient not taking: Reported on 06/26/2015) 6 tablet 0 Completed Course at Unknown time  . traMADol (ULTRAM) 50 MG tablet Take 1 tablet (50  mg total) by mouth every 6 (six) hours as needed. (Patient not taking: Reported on 06/26/2015) 15 tablet 0 Completed Course at Unknown time  . zolpidem (AMBIEN) 5 MG tablet Take 0.5 tablets (2.5 mg total) by mouth at bedtime as needed for sleep. 30 tablet 0 Past Month at Unknown    Insulin Requirements in the past 24 hours:  none  Current Nutrition:  Clinimix E 5/15 @ 75 ml/hr and lipids @ 10 ml/hr IVF: D51/2NS with 20 mEq KCl @ 35 ml/hr  Assessment: 75 yo man on TPN for pancreatitis. He was intolerant of tube feedings but now feels better and is allowed sips of clear liquids . He is tolerating TPN at current rate.  Labs are normal except decrease in NA and Cl.  We are unable to adjust electrolyte concentrations in premixed formula but will change IV fluid.  Advance Clinimix E 5/15 to goal rate of 100 ml/hr with 20% lipids at 10 ml/hr to provide 120 gm protein and 2184 Kcal per day.   Nutritional Goals:  Kcal 2000-2200 Protein 109-125 Fluid 2.2 L   Plan:  Increase Clinimix E 5/15 to 100 ml/hr  with lipids at 10 ml/hr Change IV to D5NS with 20 mEQ KCl at [facility] F/u diet advancement  Andrew Watson 07/11/2015,8:59 AM

## 2015-07-11 NOTE — Progress Notes (Signed)
  Subjective:  Patient feels much better. He states that he walked twice this morning. He has noted mild pain in right low quadrant. He said pain management because he's been turning too much to attend to his IV pole which has been moved to the right side. He is not had a bowel movement today. He is voiding without difficulty. He does not feel nauseated anymore. He is taking sips of clear liquids without any difficulty.  Objective: Blood pressure 123/65, pulse 83, temperature 98.3 F (36.8 C), temperature source Oral, resp. rate 18, height 5\' 11"  (1.803 m), weight 253 lb 3.2 oz (114.851 kg), SpO2 97 %. Patient is alert and in no acute distress. Conjunctiva is pink. Sclera is nonicteric Oropharyngeal mucosa is normal. No neck masses or thyromegaly noted. Cardiac exam with regular rhythm normal S1 and S2. Historical murmur is unchanged. Lungs are clear to auscultation. Abdomen is full. Bowel sounds are normal. On palpation abdomen is soft with fullness and firmness and right mid and low quadrant. No organomegaly or masses. He has 1+ pitting edema involving both legs.  Labs/studies Results:   Recent Labs  07/09/15 0412 07/10/15 0406  WBC 10.3 9.3  HGB 10.8* 10.6*  HCT 32.2* 31.4*  PLT 299 341    BMET   Recent Labs  07/09/15 0412 07/10/15 0406 07/11/15 0421  NA 135 133* 133*  K 3.8 3.7 3.7  CL 102 100* 99*  CO2 25 28 27   GLUCOSE 103* 126* 105*  BUN 13 13 13   CREATININE 0.89 0.86 0.80  CALCIUM 7.9* 8.0* 7.9*    LFT   Recent Labs  07/09/15 0412 07/10/15 0406 07/11/15 0421  PROT  --  6.0* 6.0*  ALBUMIN 2.3* 2.3* 2.4*  AST  --  40 36  ALT  --  50 44  ALKPHOS  --  40 43  BILITOT  --  0.9 0.8    CA-19-9 is 10 which is normal Pre-albumin 7.4.  Assessment:  #1. Severe pancreatitis presumed to be secondary to alcohol. Pancreatitis complicated by extensive abdominal edema and peripancreatic inflammatory changes. He could not tolerate nasoenteral feeding. Day 2 on TPN  which he is tolerating well. He is still at risk for pseudocyst formation. #2. Malnutrition. Serum albumin is up slightly. #3. Anemia secondary to acute illness.   Recommendations:  Patient should be able to go home within target rate of TPN reached and electrolytes are stable. Leave him on limited amount of clear liquids. He will need blood work on 07/15/2015. Will see him in the office next week and repeat CT in 2 weeks. Home health service has been consulted to arrange for outpatient TPN and nursing care.

## 2015-07-12 LAB — BASIC METABOLIC PANEL
ANION GAP: 4 — AB (ref 5–15)
BUN: 10 mg/dL (ref 6–20)
CHLORIDE: 100 mmol/L — AB (ref 101–111)
CO2: 28 mmol/L (ref 22–32)
Calcium: 8 mg/dL — ABNORMAL LOW (ref 8.9–10.3)
Creatinine, Ser: 0.87 mg/dL (ref 0.61–1.24)
GFR calc non Af Amer: >60 mL/min (ref >60)
GLUCOSE: 103 mg/dL — AB (ref 65–99)
POTASSIUM: 4 mmol/L (ref 3.5–5.1)
Sodium: 132 mmol/L — ABNORMAL LOW (ref 135–145)

## 2015-07-12 MED ORDER — PANTOPRAZOLE SODIUM 40 MG PO TBEC: 40.0000 mg | DELAYED_RELEASE_TABLET | Freq: Every day | ORAL | Status: DC

## 2015-07-12 NOTE — Discharge Summary (Signed)
Physician Discharge Summary  Andrew Watson [id number] DOB: Sep 19, 1940 DOA: 06/26/2015  PCP: Wende Neighbors, MD  Admit date: 06/26/2015 Discharge date: 07/12/2015  Time spent: 35 minutes  Recommendations for Outpatient Follow-up:  1. Follow up with PCP in 1-2 weeks. 2. Discharge with [facility] for TPN 3. Follow up with GI on 6/22   Discharge Diagnoses:  Principal Problem:   Acute pancreatitis Active Problems:   Hypothyroidism   Alcohol use ([place])   Atrial fibrillation [facility])   Discharge Condition: improved  Diet recommendation: Heart healthy  Filed Weights   06/26/15 1528 07/02/15 0617 07/04/15 0644  Weight: 104.509 kg (230 lb 6.4 oz) 113.4 kg (250 lb) 114.851 kg (253 lb 3.2 oz)    History of present illness:  38 yom with a hx of hypothyroidism presented to the ED for evaluation of severe abx pain which he described as non-radiating and throbbing in nature. He reported associated nausea and vomiting. Patient reported drinking approximately 4-5 ox of liquor per day. While being evaluated in the ED, workup showed a potassium of 3.4 and an elevated lipase. CT abd was performed and was consistent with acute pancreatitis without signs of necrosis. He was admitted for further evaluation and management.   Hospital Course:  Patient was found to have acute pancreatitis secondary to alcohol. CT abd was performed but showed no evidence of biliary duct dilatation.  He was started on IVFs and pain meds PRN. Despite tx of pancreatitis, and improvement of his lab values, pt continued to have significant nausea and abd pain.  He was unable to tolerate nutrition by mouth. Enteral feeding tube was placed, but patient did not appear to tolerate this. Therefore, PICC line was placed to start parenteral nutrition. He was continued on anti-emetics and pain management. Slowly his abd pain and nausea have improved. He is now tolerating clear liquids. Plan is to continue the patient on TPN as well as clear liquids  until he is seen by GI in follow up. Patient has been advised to abstain from any further alcohol use. Today he appears to be doing well and is ready for discharge home.      Procedures:  none  Consultations:  GI  Discharge Exam: Filed Vitals:   07/11/15 2313 07/12/15 0645  BP: 115/64 149/64  Pulse: 86 84  Temp: 98.5 F (36.9 C) 98.1 F (36.7 C)  Resp: 18 18    Examination:  General exam: Appears calm and comfortable  Respiratory system: Clear to auscultation. Respiratory effort normal. Cardiovascular system: S1 & S2 heard, RRR. No JVD, murmurs, rubs, gallops or clicks. 1+ pedal edema. Gastrointestinal system: Abdomen is nondistended, soft and nontender. No organomegaly or masses felt. Normal bowel sounds heard. Central nervous system: Alert and oriented. No focal neurological deficits. Extremities: Symmetric 5 x 5 power. Skin: No rashes, lesions or ulcers Psychiatry: Judgement and insight appear normal. Mood & affect appropriate.    Discharge Instructions   Discharge Instructions    Diet - low sodium heart healthy    Complete by:  As directed      Increase activity slowly    Complete by:  As directed           Current Discharge Medication List    CONTINUE these medications which have CHANGED   Details  pantoprazole (PROTONIX) 40 MG tablet Take 1 tablet (40 mg total) by mouth daily. Qty: 30 tablet, Refills: 3      CONTINUE these medications which have NOT CHANGED   Details  albuterol (PROVENTIL HFA;VENTOLIN HFA) 108 (90 Base) MCG/ACT inhaler Inhale 1-2 puffs into the lungs every 6 (six) hours as needed for wheezing or shortness of breath. Qty: 1 Inhaler, Refills: 1    levothyroxine (SYNTHROID, LEVOTHROID) 150 MCG tablet Take 150 mcg by mouth daily. Refills: 3    loratadine (CLARITIN) 10 MG tablet Take 10 mg by mouth daily.    traMADol (ULTRAM) 50 MG tablet Take 1 tablet (50 mg total) by mouth every 6 (six) hours as needed. Qty: 15 tablet, Refills: 0       STOP taking these medications     chlorpheniramine-HYDROcodone (TUSSIONEX) 10-8 MG/5ML SUER      azithromycin (ZITHROMAX) 250 MG tablet      levofloxacin (LEVAQUIN) 500 MG tablet      metoprolol succinate (TOPROL XL) 25 MG 24 hr tablet      predniSONE (DELTASONE) 20 MG tablet      zolpidem (AMBIEN) 5 MG tablet        No Known Allergies Follow-up Information    Follow up with [facility].   Contact information:   [address] [place] [address] [phone]       Follow up with Wende Neighbors, MD. Schedule an appointment as soon as possible for a visit today.   Specialty:  Internal Medicine   Why:  follow up in 1-2 weeks   Contact information:   [place] [address] [phone]       Follow up with Hildred Laser, MD On 07/18/2015.   Specialty:  Gastroenterology   Contact information:   [address], [address] [place] [address] [phone]        The results of significant diagnostics from this hospitalization (including imaging, microbiology, ancillary and laboratory) are listed below for reference.    Significant Diagnostic Studies: Dg Chest 1 View  07/04/2015  CLINICAL DATA:  Evaluate NG tube placement. EXAM: CHEST 1 VIEW COMPARISON:  July 01, 2015 FINDINGS: The NG tube terminates in the region of the gastric antrum. Elevation of the right hemidiaphragm is again identified. Scar or atelectasis is seen in the right mid lung. No other interval changes or acute abnormalities. IMPRESSION: The NG tube is in good position. Electronically Signed   By: Dorise Bullion III M.D   On: 07/04/2015 15:18   Dg Chest 2 View  06/26/2015  CLINICAL DATA:  Epigastric pain EXAM: CHEST  2 VIEW COMPARISON:  05/08/2015 FINDINGS: The heart size and mediastinal contours are within normal limits. Both lungs are clear. The visualized skeletal structures are unremarkable. IMPRESSION: No active cardiopulmonary disease. Electronically Signed   By:  Inez Catalina M.D.   On: 06/26/2015 08:01   Dg Abd 1 View  07/06/2015  CLINICAL DATA:  75 year old male with a history of feeding tube placement. EXAM: ABDOMEN - 1 VIEW COMPARISON:  07/05/2015 FINDINGS: Limited images of the upper abdomen demonstrates a weighted tip metallic feeding tube projecting over the mediastinum, body of the stomach, and terminating in the third/ fourth portion the duodenum. IMPRESSION: Weighted tip enteric feeding tube appears to terminate in the third/ fourth portion the duodenum. Signed, Dulcy Fanny. Earleen Newport, DO [facility] Electronically Signed   By: Corrie Mckusick D.O.   On: 07/06/2015 13:29   Dg Abd 1 View  07/05/2015  CLINICAL DATA:  Evaluate feeding tube placement EXAM: ABDOMEN - 1 VIEW COMPARISON:  07/04/2015 FINDINGS: The feeding tube tip terminates in the projection of  the expected location of the third portion of duodenum. Decrease in small bowel dilatation. IMPRESSION: 1. Interval improvement in small bowel dilatation. 2. Feeding tube tip projects over the expected location of the third portion of duodenum. Electronically Signed   By: Kerby Moors M.D.   On: 07/05/2015 10:10   Nm Hepatobiliary Including Gb  07/10/2015  CLINICAL DATA:  Abdominal pain, nausea, vomiting, acute pancreatitis EXAM: NUCLEAR MEDICINE HEPATOBILIARY IMAGING TECHNIQUE: Sequential images of the abdomen were obtained out to 60 minutes following intravenous administration of radiopharmaceutical. Imaging was continued for an additional 30 minutes at which time the patient received 4 mg of morphine IV and imaging was continued for another 30 minutes. RADIOPHARMACEUTICALS:  5.5 mCi Tc-52m  Choletec IV COMPARISON:  CT abdomen and pelvis 07/08/2015 FINDINGS: Prompt clearance of tracer by bloodstream indicating normal hepatocellular function. Mildly delayed concentration and excretion of tracer into the biliary tree. Photopenic defect at RIGHT lobe of  liver corresponding to mildly distended gallbladder on preceding CT. CBD patent with tracer identified within the bowel at 60 minutes. Beginning at 48 minutes, questionable visualization of tracer within the gallbladder. Imaging through 90 minutes failed to demonstrate additional significant localization of tracer within the gallbladder. Following morphine, increased tracer accumulation within the gallbladder is identified compatible with patent cystic duct. IMPRESSION: Patent biliary tree with tracer seen within small bowel confirming CBD patency and within the gallbladder confirming cystic duct patency. Electronically Signed   By: Lavonia Dana M.D.   On: 07/10/2015 14:45   US Abdomen Complete  06/30/2015  CLINICAL DATA:  Abnormal liver enzymes and pain.  Acute pancreatitis EXAM: ABDOMEN ULTRASOUND COMPLETE COMPARISON:  CT abdomen and pelvis Jun 26, 2015 FINDINGS: Gallbladder: There is sludge in the gallbladder. There are no echogenic foci in the gallbladder which move and shadow as is expected with gallstones. The gallbladder wall is mildly thickened. There is no pericholecystic fluid. No sonographic Murphy sign noted by sonographer. Common bile duct: Diameter: 4 mm. There is no intrahepatic, common hepatic, or common bile duct dilatation. Liver: No focal lesion identified. Liver echogenicity is increased diffusely. There is mild fatty sparing near the gallbladder fossa. IVC: Limited visualization due to bowel gas. Visualized portions appear normal. Pancreas: Pancreas completely obscured by gas. Spleen: Spleen is enlarged measuring 13.9 x 6.7 x 12.2 cm with a measured splenic volume of 616 cubic cm. No focal splenic lesions are identified. Right Kidney: Length: 12.2 cm. Echogenicity and renal cortical thickness are within normal limits. No mass or hydronephrosis visualized. Left Kidney: Length: 12.0 cm. Echogenicity and renal cortical thickness are within normal limits. No mass or hydronephrosis visualized. There  is a 6 mm calculus in the mid to lower pole left kidney. Abdominal aorta: No aneurysm visualized in visualized areas. Portions the or obscured by gas. Other findings: There is no demonstrable ascites. There is a small right pleural effusion. IMPRESSION: Pancreas obscured by gas. Comparison of pancreatitis change with recent CT cannot be made given this gas obscuring the pancreas. Sludge in gallbladder. Gallbladder wall mildly thickened. A degree of acalculus cholecystitis cannot be excluded. This finding may warrant nuclear medicine hepatobiliary imaging study to assess for cystic duct patency. Diffuse increased liver echogenicity. Mild fatty sparing near the gallbladder fossa noted. While no focal liver lesions beyond mild fatty sparing are noted, it should be noted that the sensitivity of ultrasound for focal liver lesions is diminished in this circumstance. Splenomegaly. 6 mm calculus left kidney, nonobstructing. Small right pleural effusion. Electronically Signed  By: Lowella Grip III M.D.   On: 06/30/2015 17:41   US Scrotum  06/25/2015  CLINICAL DATA:  Micro hematuria, left testicular pain for 6 months with some swelling EXAM: ULTRASOUND OF SCROTUM TECHNIQUE: Complete ultrasound examination of the testicles, epididymis, and other scrotal structures was performed. COMPARISON:  None. FINDINGS: Right testicle Measurements: The right testicle measures 4.1 x 2.4 x 2.7 cm. No intratesticular abnormality is seen. Blood flow is demonstrated to the right testicle with arterial and venous waveforms. Small calcifications are present which appear benign. Left testicle Measurements: The left testicle measures 3.7 x 2.0 x 3.0 cm. No intratesticular abnormality is noted. Blood flow is demonstrated to the left testicle with arterial and venous waveforms. Small calcifications are noted which appear benign. Right epididymis:  The right epididymis is unremarkable. Left epididymis:  The left epididymis also is  unremarkable. Hydrocele:  Only a small amount of fluid is noted on the left. Varicocele:  Conceal is seen. IMPRESSION: 1. No intratesticular abnormality is seen. Blood flow is demonstrated to both testicles. 2. Small amount of fluid on the left. Electronically Signed   By: Ivar Drape M.D.   On: 06/25/2015 12:36   Ct Abdomen Pelvis W Contrast  07/08/2015  CLINICAL DATA:  Pancreatitis. Nausea. Abdominal distention with right lower quadrant pain. EXAM: CT ABDOMEN AND PELVIS WITH CONTRAST TECHNIQUE: Multidetector CT imaging of the abdomen and pelvis was performed using the standard protocol following bolus administration of intravenous contrast. CONTRAST:  161mL ISOVUE-300 IOPAMIDOL (ISOVUE-300) INJECTION 61% COMPARISON:  07/03/2015. FINDINGS: Lower chest: Small right and trace left pleural effusions. Associated right lower lobe opacity, likely atelectasis. Coronary atherosclerosis in the LAD. Atherosclerotic calcifications of the aortic root. Hepatobiliary: Liver is within normal limits. Mild gallbladder distention. Common duct is mildly prominent. No cholelithiasis or choledocholithiasis. Pancreas: Peripancreatic inflammatory changes, predominantly along the uncinate process, compatible with acute pancreatitis. Fluid extends inferiorly along the anterior right perirenal space, right pericolic gutter, retroperitoneum along the iliopsoas bursa, and into a right inguinal hernia. When compared to the prior, there has been expected slight progression with minimal thickening/enhancing rim medially (series 2/ image 81). Additionally, there is mild hyperdense fluid layering dependently (series 2/image 58), possibly reflecting hemorrhage or proteinaceous debris. No unexpected or significant interval change. No large hematoma or pseudoaneurysm formation. Spleen: Within normal limits. Adrenals/Urinary Tract: Adrenal glands are within normal limits. 5 mm nonobstructing left lower pole renal calculus (series 2/ image 48). 13  mm anterior interpolar left renal cyst (series 2/ image 46). Right kidney is within normal limits. No hydronephrosis. Bladder is within normal limits. Stomach/Bowel: Stomach is grossly unremarkable. Enteric tube course cysts to the ligament of Treitz. Inflammatory changes/ fluid along the proximal/ mid duodenum. No evidence of bowel obstruction. Normal appendix (series 2/image 70). Vascular/Lymphatic: No evidence of abdominal aortic aneurysm. Mild atherosclerotic calcifications. No suspicious abdominopelvic lymphadenopathy. Reproductive: Prostate is grossly unremarkable. Other: Additional trace presacral fluid (series 2/ image 84). Musculoskeletal: Degenerative changes of the visualized thoracolumbar spine. IMPRESSION: Acute pancreatitis, as described above. Expected mild progression of peripancreatic fluid extending into the right retroperitoneum along the iliopsoas bursa, as described above. No unexpected or significant interval change. No large hematoma or pseudoaneurysm formation. No evidence of bowel obstruction.  Normal appendix. Electronically Signed   By: Julian Hy M.D.   On: 07/08/2015 12:46   Ct Abdomen Pelvis W Contrast  07/03/2015  CLINICAL DATA:  Abdominal distension with right lower quadrant pain since 06/26/2015 pain. Nausea. EXAM: CT ABDOMEN AND PELVIS WITH CONTRAST  TECHNIQUE: Multidetector CT imaging of the abdomen and pelvis was performed using the standard protocol following bolus administration of intravenous contrast. CONTRAST:  121mL ISOVUE-300 IOPAMIDOL (ISOVUE-300) INJECTION 61% COMPARISON:  06/26/2015. FINDINGS: Lower chest: Moderate right pleural effusion and small left pleural effusion, new. Associated compressive atelectasis in both lower lobes, right greater than left. Heart is enlarged. No pericardial effusion. Hepatobiliary: Liver and gallbladder are unremarkable. Common bile duct measures up to 12 mm, similar. Minimal intrahepatic biliary ductal dilatation. Pancreas: Marked  increase in inflammatory stranding, haziness and fluid surrounding the pancreatic head and uncinate process. Fluid extends into the right posterior para renal space and inferiorly into the right anatomic pelvis and right inguinal hernia. Pancreas is uniform in attenuation. Spleen: Negative. Adrenals/Urinary Tract: Adrenal gland are unremarkable. Low-attenuation lesions in the kidneys measure up to 1.3 cm on the left, too small to characterize but statistically, cysts are most likely. Small left renal stone. Ureters are decompressed. Bladder is grossly unremarkable. Stomach/Bowel: Stomach is unremarkable. Peripancreatic inflammatory stranding and fluid surrounds the duodenum. Small bowel and colon are otherwise unremarkable. Appendix is not readily visualized. Vascular/Lymphatic: Atherosclerotic calcification of the arterial vasculature without abdominal aortic aneurysm. No pathologically enlarged lymph nodes. Reproductive: Prostate is visualized. Other: Peripancreatic fluid extends into the right posterior para renal space and inferiorly into the right anatomic pelvis treated is also seen within a right inguinal hernia, as discussed above. Left inguinal hernia contains a small amount of fluid. Mild presacral edema. Small ascites. Musculoskeletal: No worrisome lytic or sclerotic lesions. Degenerative changes are seen in the spine and hips. IMPRESSION: 1. Worsening pancreatitis.  No evidence of necrosis. 2. Moderate right and small left pleural effusions, new. 3. Common bile duct dilatation, stable. 4. Small left renal stone. Electronically Signed   By: Lorin Picket M.D.   On: 07/03/2015 12:55   [place] Art/ven Flow Abd Pelv Doppler  06/25/2015  CLINICAL DATA:  Micro hematuria, left testicular pain for 6 months with some swelling EXAM: ULTRASOUND OF SCROTUM TECHNIQUE: Complete ultrasound examination of the testicles, epididymis, and other scrotal structures was performed. COMPARISON:  None. FINDINGS: Right testicle  Measurements: The right testicle measures 4.1 x 2.4 x 2.7 cm. No intratesticular abnormality is seen. Blood flow is demonstrated to the right testicle with arterial and venous waveforms. Small calcifications are present which appear benign. Left testicle Measurements: The left testicle measures 3.7 x 2.0 x 3.0 cm. No intratesticular abnormality is noted. Blood flow is demonstrated to the left testicle with arterial and venous waveforms. Small calcifications are noted which appear benign. Right epididymis:  The right epididymis is unremarkable. Left epididymis:  The left epididymis also is unremarkable. Hydrocele:  Only a small amount of fluid is noted on the left. Varicocele:  Conceal is seen. IMPRESSION: 1. No intratesticular abnormality is seen. Blood flow is demonstrated to both testicles. 2. Small amount of fluid on the left. Electronically Signed   By: Ivar Drape M.D.   On: 06/25/2015 12:36   Dg Chest Port 1 View  07/01/2015  CLINICAL DATA:  Short of breath, wheezing. Increased work of breathing. EXAM: PORTABLE CHEST 1 VIEW COMPARISON:  06/29/2015 FINDINGS: Stable enlarged cardiac silhouette. There is extremely low lung volumes similar to prior. The gas-filled colon noted between the LEFT and RIGHT hemidiaphragms. Rounded density projecting over the lateral LEFT lower lobe and cardiac border measuring approximate 6 cm appears to be removed abetween the first and second images. There is new atelectasis or effusion at the LEFT lung base. IMPRESSION: 1.  Extremely low lung volumes and basilar atelectasis, not changed from prior. 2. New atelectasis or effusion in the LEFT lower lobe. Infiltrate felt less likely. Electronically Signed   By: Suzy Bouchard M.D.   On: 07/01/2015 10:08   Dg Chest Port 1 View  06/29/2015  CLINICAL DATA:  In total evaluation for sudden onset shortness of breath, wheezing. EXAM: PORTABLE CHEST 1 VIEW COMPARISON:  Prior radiograph from 06/26/2015. FINDINGS: Cardiomegaly is stable  from prior. Mediastinal silhouette remains within normal limits. The current examination as been performed with a similar degree of shallow lung inflation. Mild perihilar vascular congestion without overt pulmonary edema. Probable small right pleural effusion. No definite left pleural effusion. Right basilar opacity favored to reflect atelectasis/bronchovascular crowding/congestion. Superimposed infiltrate and/or aspiration pneumonitis could be considered in the correct clinical setting. No pneumothorax. No acute osseous abnormality. Degenerative changes about the partially visualized shoulders. Few prominent gas-filled loops of bowel noted within the upper abdomen. Curvilinear focus of air at the medial left lung base felt to lie within gas-filled bowel. IMPRESSION: 1. Stable cardiomegaly with mild perihilar vascular congestion without overt pulmonary edema. 2. Shallow lung inflation with patchy opacity at the right lung base. Finding is favored to reflect atelectasis/bronchovascular crowding and/or congestion. Possible infectious infiltrate or sequela of aspiration could be considered in the correct clinical setting. 3. Probable small right pleural effusion. Electronically Signed   By: Jeannine Boga M.D.   On: 06/29/2015 01:32   Dg Abd Acute W/chest  07/07/2015  CLINICAL DATA:  Nausea, vomiting, and distention. EXAM: DG ABDOMEN ACUTE W/ 1V CHEST COMPARISON:  July 06, 2015 FINDINGS: The feeding tube terminates in stable position, likely just beyond the ligament of Treitz. No evidence of bowel obstruction. No free air, portal venous gas, or pneumatosis. Minimal atelectasis in the left base. The chest is otherwise normal. No other acute abnormalities. IMPRESSION: The feeding tube is in good position.  No acute abnormalities. Electronically Signed   By: Dorise Bullion III M.D   On: 07/07/2015 13:12   Dg Abd Acute W/chest  07/04/2015  CLINICAL DATA:  NG tube placement EXAM: DG ABDOMEN ACUTE W/ 1V CHEST  COMPARISON:  Chest radiographs dated 07/04/2015 at 1357 hours. CT abdomen pelvis dated 07/03/2015. FINDINGS: Low lung volumes. Mild patchy/platelike opacity in the right mid lung, likely atelectasis. Elevation the right hemidiaphragm. Small right pleural effusion. No pneumothorax. The heart is top-normal in size. Nonobstructive bowel gas pattern. Weighted feeding tube terminates in the distal 2nd portion of the duodenum. No evidence of free air under the diaphragm on the upright view. Degenerative changes of the lower lumbar spine. IMPRESSION: Small right pleural effusion. No evidence of small bowel obstruction or free air. Weighted feeding tube terminates in the distal 2nd portion the duodenum. Electronically Signed   By: Julian Hy M.D.   On: 07/04/2015 19:44   Dg Abd Portable 1v  07/04/2015  CLINICAL DATA:  NG tube placement EXAM: PORTABLE ABDOMEN - 1 VIEW COMPARISON:  07/04/2015 at 1924 hours FINDINGS: Weighted feeding tube terminates in the proximal 3rd portion of the duodenum. Mildly prominent loops of small bowel in the left mid abdomen. Colon is not decompressed. IMPRESSION: Weighted feeding tube terminates in the proximal 3rd portion of the duodenum. Electronically Signed   By: Julian Hy M.D.   On: 07/04/2015 21:36   Ct Cta Abd/pel W/cm &/or W/o Cm  06/26/2015  CLINICAL DATA:  Severe abdominal pain for several hours, initial encounter EXAM: CTA ABDOMEN AND PELVIS wITHOUT AND WITH  CONTRAST TECHNIQUE: Multidetector CT imaging of the abdomen and pelvis was performed using the standard protocol during bolus administration of intravenous contrast. Multiplanar reconstructed images and MIPs were obtained and reviewed to evaluate the vascular anatomy. CONTRAST:  100 mL Isovue 370. COMPARISON:  None. FINDINGS: Lung bases are free of acute infiltrate or sizable effusion. The liver, gallbladder, spleen, adrenal glands and kidneys are within normal limits with the exception of a single  nonobstructing left renal stone in the lower pole. It measures approximately 8 mm. No obstructive changes are seen. The pancreas is well visualized and peripancreatic inflammatory changes are noted surrounding the head and uncinate process. These also developed the duodenum. No focal pseudocyst is seen. No significant phlegmon is noted. The abdominal aorta and its branches are within normal limits. No aneurysmal dilatation is seen. No significant mesenteric ischemia is seen. The bladder is well distended. No pelvic mass lesion is seen. The appendix is partially visualized. No inflammatory changes are seen. The osseous structures show degenerative change of the lumbar spine. No acute bony abnormality is seen. Review of the MIP images confirms the above findings. IMPRESSION: Nonobstructing left lower pole renal calculus. Inflammatory changes in the region of the pancreatic head and uncinate process also developing the duodenum. These changes are consistent with acute pancreatitis. Correlation with laboratory values is recommended. No other focal abnormality is seen. Electronically Signed   By: Inez Catalina M.D.   On: 06/26/2015 09:37    Microbiology: Recent Results (from the past 240 hour(s))  C difficile quick scan w PCR reflex     Status: None   Collection Time: 07/07/15  3:12 PM  Result Value Ref Range Status   C Diff antigen NEGATIVE NEGATIVE Final   C Diff toxin NEGATIVE NEGATIVE Final   C Diff interpretation Negative for toxigenic C. difficile  Final     Labs: Basic Metabolic Panel:  Recent Labs Lab 07/08/15 0416 07/09/15 0412 07/09/15 0855 07/10/15 0406 07/11/15 0421 07/12/15 0756  NA 134* 135  --  133* 133* 132*  K 3.6 3.8  --  3.7 3.7 4.0  CL 99* 102  --  100* 99* 100*  CO2 30 25  --  28 27 28   GLUCOSE 110* 103*  --  126* 105* 103*  BUN 15 13  --  13 13 10   CREATININE 0.96 0.89  --  0.86 0.80 0.87  CALCIUM 8.0* 7.9*  --  8.0* 7.9* 8.0*  MG  --   --  2.1 2.2 2.2  --   PHOS  --    --  3.3 3.4 3.4  --    Liver Function Tests:  Recent Labs Lab 07/07/15 0524 07/08/15 0416 07/09/15 0412 07/10/15 0406 07/11/15 0421  AST 62* 51*  --  40 36  ALT 66* 58  --  50 44  ALKPHOS 58 48  --  40 43  BILITOT 0.8 0.9  --  0.9 0.8  PROT 6.3* 5.9*  --  6.0* 6.0*  ALBUMIN 2.2* 2.3* 2.3* 2.3* 2.4*   No results for input(s): LIPASE, AMYLASE in the last 168 hours. No results for input(s): AMMONIA in the last 168 hours. CBC:  Recent Labs Lab 07/07/15 0524 07/09/15 0412 07/10/15 0406  WBC 8.7 10.3 9.3  NEUTROABS  --   --  7.0  HGB 11.6* 10.8* 10.6*  HCT 35.1* 32.2* 31.4*  MCV 96.4 94.4 94.0  PLT 264 299 341   Cardiac Enzymes: No results for input(s): CKTOTAL, CKMB, CKMBINDEX, TROPONINI in  the last 168 hours. BNP: BNP (last 3 results) No results for input(s): BNP in the last 8760 hours.  ProBNP (last 3 results) No results for input(s): PROBNP in the last 8760 hours.  CBG: No results for input(s): GLUCAP in the last 168 hours.    Signed:  Kathie Dike, MD.  [facility] 07/12/2015, 1:11 PM  By signing my name below, I, Delene Ruffini, attest that this documentation has been prepared under the direction and in the presence of Kathie Dike, MD. Electronically Signed: Delene Ruffini 07/12/2015 1:00pm  I, Dr. Kathie Dike, personally performed the services described in this documentaiton. All medical record entries made by the scribe were at my direction and in my presence. I have reviewed the chart and agree that the record reflects my personal performance and is accurate and complete  Kathie Dike, MD, 07/12/2015 1:11 PM

## 2015-07-12 NOTE — Care Management Important Message (Signed)
Important Message  Patient Details  Name: Andrew Watson MRN: [id number] Date of Birth: 1940-02-02   Medicare Important Message Given:  Yes    Sherald Barge, RN 07/12/2015, 9:51 AM

## 2015-07-12 NOTE — Care Management Note (Signed)
Case Management Note  Patient Details  Name: Andrew Watson MRN: [id number] Date of Birth: 03-Jan-1941  Expected Discharge Date:     07/12/2015             Expected Discharge Plan:  [place]  In-House Referral:  Nutrition  Discharge planning Services  CM Consult  Post Acute Care Choice:  Home Health Choice offered to:  Patient  DME Arranged:  Bedside commode DME Agency:  [place] Arranged:  RN, Social Work [facility] Agency:  [place]  Status of Service:  Completed, signed off  Medicare Important Message Given:  Yes Date Medicare IM Given:    Medicare IM give by:    Date Additional Medicare IM Given:    Additional Medicare Important Message give by:     If discussed at [place] of Stay Meetings, dates discussed:    Additional Comments: Pt discharging home today with [facility] services through [facility], aware of DC and pt's TPN will be delivered at home at 3pm. Pt is aware.   Sherald Barge, RN 07/12/2015, 1:42 PM

## 2015-07-12 NOTE — Progress Notes (Signed)
Patient states he feels well. He has minimal discomfort in right low quadrant hypogastric region. He denies diarrhea or difficulty voiding. She also denies nausea vomiting chest pain or shortness of breath. Metabolic 7. Serum sodium is 133 and potassium 4.0. Creatinine 0.87. Patient is ready for discharge. He will need to continue pantoprazole primarily for GERD symptoms. Patient advised not to take OTC NSAIDs. I will see patient office on 07/18/2015.

## 2015-07-12 NOTE — Progress Notes (Signed)
Discharged PT per MD order and protocol. Discharge handouts reviewed/explained. Education completed. Explained about [facility] coming to complete TPN.   Pt verbalized understanding and left with all belongings. VSS. PICC left in place for [facility] services.  Patient wheeled down by staff member. Oswald Hillock, RN

## 2015-07-18 ENCOUNTER — Ambulatory Visit (INDEPENDENT_AMBULATORY_CARE_PROVIDER_SITE_OTHER): Payer: Medicare Other | Admitting: Internal Medicine

## 2015-07-18 ENCOUNTER — Encounter (INDEPENDENT_AMBULATORY_CARE_PROVIDER_SITE_OTHER): Payer: Self-pay | Admitting: Internal Medicine

## 2015-07-18 VITALS — BP 118/70 | HR 73 | Temp 97.6°F | Resp 18 | Ht 70.0 in | Wt 213.5 lb

## 2015-07-18 DIAGNOSIS — R740 Nonspecific elevation of levels of transaminase and lactic acid dehydrogenase [LDH]: Secondary | ICD-10-CM | POA: Diagnosis not present

## 2015-07-18 DIAGNOSIS — K858 Other acute pancreatitis without necrosis or infection: Secondary | ICD-10-CM

## 2015-07-18 DIAGNOSIS — R748 Abnormal levels of other serum enzymes: Secondary | ICD-10-CM

## 2015-07-18 NOTE — Patient Instructions (Signed)
Abdominopelvic CT with contrast to be scheduled next week Wednesday

## 2015-07-18 NOTE — Progress Notes (Signed)
Presenting complaint;  Follow-up for complicated pancreatitis.  Database and Subjective:  Patient is 75 year old Caucasian male who has been in excellent health who was hospitalized at [facility] for more than 2 weeks for acute pancreatitis presumed to be due to alcohol. He was discharged 02/11/2015. He remains on parenteral nutrition. He states he has had very little pain since he was discharged from the hospital and as a result he has not taken pain medication but he has had nausea and vomiting. He had nausea and vomiting over the weekend and tried ondansetron. He was experiencing intense nausea yesterday and prescription for Phenergan suppository was called. He used one last evening. He states he was able to leave well. This morning he has slight nausea but none at the present time. He does not have good appetite. He did not experience any side effects with Phenergan suppository. He walks some at home. He is not having any problems with TPN. He is hoping that he can go to the beach week after next. He is nothing by mouth except by mouth medications week which she takes with small sips of water. He is having 4-5 small volume loose stools daily. He states he is losing about a pound every day.  Current Medications: Outpatient Encounter Prescriptions as of 07/18/2015  Medication Sig  . levothyroxine (SYNTHROID, LEVOTHROID) 150 MCG tablet Take 150 mcg by mouth daily.  [name] Kitchen loratadine (CLARITIN) 10 MG tablet Take 10 mg by mouth daily.  . pantoprazole (PROTONIX) 40 MG tablet Take 1 tablet (40 mg total) by mouth daily.  . promethazine (PHENERGAN) 25 MG suppository Place 25 mg rectally 2 (two) times daily as needed for nausea or vomiting.   [name] Kitchen albuterol (PROVENTIL HFA;VENTOLIN HFA) 108 (90 Base) MCG/ACT inhaler Inhale 1-2 puffs into the lungs every 6 (six) hours as needed for wheezing or shortness of breath. (Patient not taking: Reported on 07/18/2015)  . traMADol (ULTRAM) 50 MG tablet Take 1 tablet (50 mg total)  by mouth every 6 (six) hours as needed. (Patient not taking: Reported on 06/26/2015)   No facility-administered encounter medications on file as of 07/18/2015.    Objective: Blood pressure 118/70, pulse 73, temperature 97.6 F (36.4 C), temperature source Oral, resp. rate 18, height 5\' 10"  (1.778 m), weight 213 lb 8 oz (96.843 kg). Patient is alert and in no acute distress. Conjunctiva is pink. Sclera is nonicteric Oropharyngeal mucosa is normal. No neck masses or thyromegaly noted. Cardiac exam with regular rhythm normal S1 and S2. Faint systolic ejection murmur noted at left sternal border. Lungs are clear to auscultation. Abdomen is internal and not distended. Bowel sounds are normal. On palpation it is soft. From this fullness remains in right low quadrant and right midabdomen laterally. No masses or organomegaly. No LE edema or clubbing noted. No tenderness noted at PICC line entry site.  Labs/studies Results: Lab data from 07/15/2015  WBC 7.2  H&H 10.3 and 32.2  Platelet count 374K  Glucose 98, BUN 16, creatinine 0.75  Serum sodium 136, potassium 4.3, chloride 97, CO2 23, calcium 9.0  Bilirubin 0.8, AP 69, AST 42, ALT 56 total protein 7.1 and albumin 3.1 Serum magnesium 2.0 Phosphorus 3.8 Triglycerides 57 and pre-albumin 13  Assessment:  #1. 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 He did not tolerate tube feeding. He has been on TPN for more than one week and is doing well. Intermittent nausea and vomiting possibly secondary to impingement of pancreatic phlegmon and duodenum or he could have an element of  gastroparesis. Doubt that he has peptic ulcer disease. He is on PPI. If this symptom recurs will consider diagnostic EGD. #2. TPN. He is doing well except there is slight increase in his transaminases. Transaminases mildly elevated in the hospital but returned to normal before discharge. Will monitor. #3. Anemia secondary to acute illness. No evidence of  GI blood loss.   Plan:  Patient can try 1 or 2 popsicles per day. Continue TPN at current rate. Complete blood work next Monday. Abdominopelvic CT with IV contrast on 07/24/2015. Office visit in 3 weeks. Patient will call if vomiting recurs in which case we'll consider EGD.

## 2015-07-19 ENCOUNTER — Encounter (INDEPENDENT_AMBULATORY_CARE_PROVIDER_SITE_OTHER): Payer: Self-pay | Admitting: *Deleted

## 2015-07-19 DIAGNOSIS — K858 Other acute pancreatitis without necrosis or infection: Secondary | ICD-10-CM

## 2015-07-19 NOTE — Addendum Note (Signed)
Addended by: Rogene Houston on: 07/19/2015 08:52 AM   Modules accepted: Orders

## 2015-07-19 NOTE — Progress Notes (Signed)
This encounter was created in error - please disregard.

## 2015-07-22 ENCOUNTER — Telehealth (INDEPENDENT_AMBULATORY_CARE_PROVIDER_SITE_OTHER): Payer: Self-pay | Admitting: *Deleted

## 2015-07-22 ENCOUNTER — Telehealth (INDEPENDENT_AMBULATORY_CARE_PROVIDER_SITE_OTHER): Payer: Self-pay | Admitting: Internal Medicine

## 2015-07-22 NOTE — Telephone Encounter (Signed)
Last weight with Dr Laural Golden 211.

## 2015-07-22 NOTE — Telephone Encounter (Signed)
The patient's daughter, Leafy Kindle called and stated that the patient was prescribed Compro which is $110, but insurance only covered $30 because it is not on his preferred medication list.  The pharmacy told her that if the doctor calls on the patient's behalf they may pay.  Per Karna Christmas this is a prior authorization.  [phone]

## 2015-07-22 NOTE — Telephone Encounter (Signed)
Patient needs CT A/P sch'd and his insurance requires authorization. I submitted all information to insurance company on Friday (07/19/15). I did call today and spoke to patient's daughter letting her know that it needed authorization and I was waiting to hear back from insurance company and once I rec'd that I would schedule it and give them a call but I couldn't schedule CT until authorization was done (per hospital protocol). She voiced understanding. She called back this afternoon very pushy about getting CT scheduled. Again I explained until I got authorization I couldn't schedule CT and that I understood the frustration but we were at the mercy of the insurance company.

## 2015-07-22 NOTE — Telephone Encounter (Signed)
Please send this to [facility]

## 2015-07-22 NOTE — Telephone Encounter (Signed)
Judson Roch, nurse from [place] called, she said she is seeing the patient for PPN. She stated that his vitals are good, but the patient has had a significant amount of weight loss.  She stated when she saw him last week he weighed 216.7 and today he weighed 209.7.  She wanted to make [place] aware and see if there was anything we want to do.  She would like a call back.  [phone]

## 2015-07-24 ENCOUNTER — Ambulatory Visit (HOSPITAL_COMMUNITY)
Admission: RE | Admit: 2015-07-24 | Discharge: 2015-07-24 | Disposition: A | Discharge status: Home / Self Care | Payer: Medicare Other | Source: Ambulatory Visit | Attending: Internal Medicine | Admitting: Internal Medicine

## 2015-07-24 DIAGNOSIS — R740 Nonspecific elevation of levels of transaminase and lactic acid dehydrogenase [LDH]: Secondary | ICD-10-CM | POA: Diagnosis present

## 2015-07-24 DIAGNOSIS — R748 Abnormal levels of other serum enzymes: Secondary | ICD-10-CM

## 2015-07-24 DIAGNOSIS — K859 Acute pancreatitis without necrosis or infection, unspecified: Secondary | ICD-10-CM | POA: Insufficient documentation

## 2015-07-24 DIAGNOSIS — K858 Other acute pancreatitis without necrosis or infection: Secondary | ICD-10-CM

## 2015-07-24 DIAGNOSIS — Z90410 Acquired total absence of pancreas: Secondary | ICD-10-CM | POA: Diagnosis not present

## 2015-07-24 MED ORDER — IOPAMIDOL (ISOVUE-300) INJECTION 61%
100.0000 mL | Freq: Once | INTRAVENOUS | Status: AC | PRN
  Administered 2015-07-24: 100 mL via INTRAVENOUS

## 2015-07-24 NOTE — Telephone Encounter (Signed)
noted 

## 2015-07-26 ENCOUNTER — Other Ambulatory Visit (INDEPENDENT_AMBULATORY_CARE_PROVIDER_SITE_OTHER): Payer: Self-pay | Admitting: *Deleted

## 2015-07-26 ENCOUNTER — Other Ambulatory Visit (INDEPENDENT_AMBULATORY_CARE_PROVIDER_SITE_OTHER): Payer: Self-pay | Admitting: Internal Medicine

## 2015-07-26 ENCOUNTER — Telehealth (INDEPENDENT_AMBULATORY_CARE_PROVIDER_SITE_OTHER): Payer: Self-pay | Admitting: *Deleted

## 2015-07-26 DIAGNOSIS — K863 Pseudocyst of pancreas: Secondary | ICD-10-CM

## 2015-07-26 DIAGNOSIS — R634 Abnormal weight loss: Secondary | ICD-10-CM

## 2015-07-26 DIAGNOSIS — K858 Other acute pancreatitis without necrosis or infection: Secondary | ICD-10-CM

## 2015-07-26 NOTE — Telephone Encounter (Signed)
Per Dr.Rehman may call in Pantoprazole 40 mg - take 1 by mouth twice a day 360 5 refills. Phenergan Suppository 25 mg - Inset reectally twice a day for nausea or vomiting. #24 1 refill. This was called to [facility] Patient's daughter was called and made aware.

## 2015-07-27 ENCOUNTER — Other Ambulatory Visit (INDEPENDENT_AMBULATORY_CARE_PROVIDER_SITE_OTHER): Payer: Self-pay | Admitting: Internal Medicine

## 2015-07-27 ENCOUNTER — Ambulatory Visit (HOSPITAL_COMMUNITY)
Admission: RE | Admit: 2015-07-27 | Discharge: 2015-07-27 | Disposition: A | Discharge status: Home / Self Care | Payer: Medicare Other | Source: Ambulatory Visit | Attending: Internal Medicine | Admitting: Internal Medicine

## 2015-07-27 DIAGNOSIS — K858 Other acute pancreatitis without necrosis or infection: Secondary | ICD-10-CM | POA: Diagnosis present

## 2015-07-27 DIAGNOSIS — R634 Abnormal weight loss: Secondary | ICD-10-CM

## 2015-07-27 DIAGNOSIS — K863 Pseudocyst of pancreas: Secondary | ICD-10-CM | POA: Insufficient documentation

## 2015-07-27 DIAGNOSIS — K859 Acute pancreatitis without necrosis or infection, unspecified: Secondary | ICD-10-CM | POA: Insufficient documentation

## 2015-07-27 MED ORDER — GADOBENATE DIMEGLUMINE 529 MG/ML IV SOLN
20.0000 mL | Freq: Once | INTRAVENOUS | Status: AC | PRN
  Administered 2015-07-27: 19 mL via INTRAVENOUS

## 2015-07-29 ENCOUNTER — Encounter (INDEPENDENT_AMBULATORY_CARE_PROVIDER_SITE_OTHER): Payer: Self-pay

## 2015-07-31 ENCOUNTER — Encounter (INDEPENDENT_AMBULATORY_CARE_PROVIDER_SITE_OTHER): Payer: Self-pay

## 2015-08-01 DIAGNOSIS — K863 Pseudocyst of pancreas: Secondary | ICD-10-CM | POA: Insufficient documentation

## 2015-08-06 ENCOUNTER — Ambulatory Visit (INDEPENDENT_AMBULATORY_CARE_PROVIDER_SITE_OTHER): Payer: Medicare Other | Admitting: Internal Medicine

## 2015-08-07 ENCOUNTER — Encounter (INDEPENDENT_AMBULATORY_CARE_PROVIDER_SITE_OTHER): Payer: Self-pay

## 2015-08-08 ENCOUNTER — Ambulatory Visit (INDEPENDENT_AMBULATORY_CARE_PROVIDER_SITE_OTHER): Payer: Medicare Other | Admitting: Internal Medicine

## 2015-08-10 DIAGNOSIS — K315 Obstruction of duodenum: Secondary | ICD-10-CM | POA: Insufficient documentation

## 2015-08-10 DIAGNOSIS — K311 Adult hypertrophic pyloric stenosis: Secondary | ICD-10-CM | POA: Insufficient documentation

## 2015-08-10 DIAGNOSIS — Z980 Intestinal bypass and anastomosis status: Secondary | ICD-10-CM | POA: Insufficient documentation

## 2015-08-12 DIAGNOSIS — K8309 Other cholangitis: Secondary | ICD-10-CM | POA: Insufficient documentation

## 2015-08-12 DIAGNOSIS — I2699 Other pulmonary embolism without acute cor pulmonale: Secondary | ICD-10-CM | POA: Insufficient documentation

## 2015-08-12 DIAGNOSIS — K859 Acute pancreatitis without necrosis or infection, unspecified: Secondary | ICD-10-CM | POA: Insufficient documentation

## 2015-08-12 DIAGNOSIS — Z86711 Personal history of pulmonary embolism: Secondary | ICD-10-CM | POA: Insufficient documentation

## 2015-09-04 DIAGNOSIS — K863 Pseudocyst of pancreas: Secondary | ICD-10-CM | POA: Diagnosis not present

## 2015-09-04 DIAGNOSIS — K861 Other chronic pancreatitis: Secondary | ICD-10-CM | POA: Diagnosis not present

## 2015-09-10 ENCOUNTER — Inpatient Hospital Stay
Admission: RE | Admit: 2015-09-10 | Discharge: 2015-09-18 | Disposition: A | Discharge status: Home / Self Care | Payer: Medicare Other | Source: Ambulatory Visit | Attending: Internal Medicine | Admitting: Internal Medicine

## 2015-09-12 ENCOUNTER — Encounter: Payer: Self-pay | Admitting: Internal Medicine

## 2015-09-12 ENCOUNTER — Non-Acute Institutional Stay (SKILLED_NURSING_FACILITY): Payer: Medicare Other | Admitting: Internal Medicine

## 2015-09-12 DIAGNOSIS — K311 Adult hypertrophic pyloric stenosis: Secondary | ICD-10-CM

## 2015-09-12 DIAGNOSIS — K8581 Other acute pancreatitis with uninfected necrosis: Secondary | ICD-10-CM | POA: Diagnosis not present

## 2015-09-12 DIAGNOSIS — K858 Other acute pancreatitis without necrosis or infection: Secondary | ICD-10-CM

## 2015-09-12 DIAGNOSIS — Z980 Intestinal bypass and anastomosis status: Secondary | ICD-10-CM | POA: Diagnosis not present

## 2015-09-12 DIAGNOSIS — K863 Pseudocyst of pancreas: Secondary | ICD-10-CM

## 2015-09-12 DIAGNOSIS — K315 Obstruction of duodenum: Secondary | ICD-10-CM

## 2015-09-12 DIAGNOSIS — D638 Anemia in other chronic diseases classified elsewhere: Secondary | ICD-10-CM

## 2015-09-12 DIAGNOSIS — I2699 Other pulmonary embolism without acute cor pulmonale: Secondary | ICD-10-CM

## 2015-09-12 DIAGNOSIS — E038 Other specified hypothyroidism: Secondary | ICD-10-CM

## 2015-09-12 NOTE — Progress Notes (Addendum)
Provider:  Veleta Miners Location:   [place] Room Number: 128/P Place of Service:  SNF (31)  PCP: Wende Neighbors, MD Patient Care Team: Celene Squibb, MD as PCP - General (Internal Medicine)  Extended Emergency Contact Information Primary Emergency Contact: Maree Erie [place] Phone: [phone] Relation: Daughter Secondary Emergency Contact: Bud Face [place] of [place] Phone: [phone] Mobile Phone: [phone] Relation: Friend  Code Status: Full Code Goals of Care: Advanced Directive information Advanced Directives 09/12/2015  Does patient have an advance directive? Yes  Type of Advance Directive (No Data)  Does patient want to make changes to advanced directive? No - Patient declined  Copy of advanced directive(s) in chart? Yes  Would patient like information on creating an advanced directive? -  Pre-existing out of facility DNR order (yellow form or pink MOST form) -      Chief Complaint  Patient presents with  . New Admit To SNF    HPI: Patient is a 75 y.o. male seen today for admission to to SNF for Rehab. Patient had Acute Pancreatitis in 05/17.  Pancreatitis was complicated by Pseudocyst. Patient was unable to tolerate PO feed. He was on TPA and then was changed to J tube. 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 He was send to [place] for further evaluation. There he was also diagnosed with PE. And was started on Eliquis.He also had retroperitoneal Fluid collection. The Drain was placed. The fluid had no growth. Patient is now discharged with J tube. He is tolerating 39ml/hr feed. He is having occassional nausea. He is in no pain. Is not having any chest pain or SOB. No cough fever or chills. He says he has lost almost 30 lbs since this episode. Also feels very weak. But wants to go home as soon as he feels better.  Past Medical History:  Diagnosis Date     . Arthritis     . Gout   . Thyroid disease    Past Surgical History:  Procedure Laterality Date  . BACK SURGERY    . COLONOSCOPY  07/08/2011   Procedure: COLONOSCOPY;  Surgeon: Rogene Houston, MD;  Location: [facility];  Service: Endoscopy;  Laterality: N/A;  930  . KNEE ARTHROSCOPY    . LEFT HEART CATHETERIZATION WITH CORONARY ANGIOGRAM N/A 05/07/2011   Procedure: LEFT HEART CATHETERIZATION WITH CORONARY ANGIOGRAM;  Surgeon: Peter M Martinique, MD;  Location: [facility] CATH LAB;  Service: Cardiovascular;  Laterality: N/A;    reports that he has never smoked. He has never used smokeless tobacco. He reports that he drinks about 30.0 oz of alcohol per week . He reports that he does not use drugs. Social History   Social History  . Marital status: widowed    Spouse name: N/A  . Number of children: N/A  . Years of education: N/A   Occupational History  . Not on file.   Social History Main Topics  . Smoking status: Never Smoker  . Smokeless tobacco: Never Used  . Alcohol use 30.0 oz/week    50 Shots of liquor per week  . Drug use: No  . Sexual activity: Not on file   Other Topics Concern  . Not on file   Social History Narrative  . No narrative on file    Functional Status Survey:    History reviewed. No pertinent family history.  Health Maintenance  Topic Date Due  . TETANUS/TDAP  09/26/1958  . ZOSTAVAX  09/26/1999  . PNA vac Low Risk Adult (1 of 2 - PCV13) 09/25/2004  . INFLUENZA VACCINE  12/27/2015 (Originally 08/27/2015)  . COLONOSCOPY  07/07/2021    No Known Allergies  Current Outpatient Prescriptions on File Prior to Visit  Medication Sig Dispense Refill  . levothyroxine (SYNTHROID, LEVOTHROID) 150 MCG tablet Take 150 mcg by mouth daily.  3  . pantoprazole (PROTONIX) 40 MG tablet Take 1 tablet (40 mg total) by mouth daily. 30 tablet 3   Eliquis 5mg  BID Diflucan200 mg QD Ondansetron Q8 hours PRN Trazadone 50 mg QHS    Review of Systems  Constitutional: Positive for fatigue.  Negative for fever.  HENT: Positive for mouth sores.   Respiratory: Negative for chest tightness, shortness of breath and wheezing.   Cardiovascular: Negative for chest pain, palpitations and leg swelling.  Gastrointestinal: Positive for diarrhea and nausea. Negative for abdominal distention, abdominal pain, blood in stool and vomiting.  Genitourinary: Negative.     Vitals:   09/12/15 1208  BP: 96/68  Pulse: 95  Resp: 20  Temp: 98.2 F (36.8 C)  TempSrc: Oral   There is no height or weight on file to calculate BMI. Physical Exam  Constitutional: He is oriented to person, place, and time. He appears well-developed.  HENT:  Head: Normocephalic.  Neck: Neck supple.  Cardiovascular: Normal rate and regular rhythm.   Pulmonary/Chest: Breath sounds normal. He has no wheezes. He has no rales.  Abdominal: Bowel sounds are normal. He exhibits no distension. There is no tenderness. There is no rebound.  Had 3 tubes. J tube and tube in retroperitoneal area  Musculoskeletal: He exhibits no edema or tenderness.  Neurological: He is oriented to person, place, and time.  Skin: Skin is warm. No rash noted. No erythema.  Psychiatric: He has a normal mood and affect. His behavior is normal.    Labs reviewed: Basic Metabolic Panel:  Recent Labs  07/09/15 0855 07/10/15 0406 07/11/15 0421 07/12/15 0756  NA  --  133* 133* 132*  K  --  3.7 3.7 4.0  CL  --  100* 99* 100*  CO2  --  28 27 28   GLUCOSE  --  126* 105* 103*  BUN  --  13 13 10   CREATININE  --  0.86 0.80 0.87  CALCIUM  --  8.0* 7.9* 8.0*  MG 2.1 2.2 2.2  --   PHOS 3.3 3.4 3.4  --    Liver Function Tests:  Recent Labs  07/08/15 0416 07/09/15 0412 07/10/15 0406 07/11/15 0421  AST 51*  --  40 36  ALT 58  --  50 44  ALKPHOS 48  --  40 43  BILITOT 0.9  --  0.9 0.8  PROT 5.9*  --  6.0* 6.0*  ALBUMIN 2.3* 2.3* 2.3* 2.4*    Recent Labs  07/01/15 1110 07/02/15 0602 07/05/15 0410  LIPASE 17 16 27    No results for  input(s): AMMONIA in the last 8760 hours. CBC:  Recent Labs  07/07/15 0524 07/09/15 0412 07/10/15 0406  WBC 8.7 10.3 9.3  NEUTROABS  --   --  7.0  HGB 11.6* 10.8* 10.6*  HCT 35.1* 32.2* 31.4*  MCV 96.4 94.4 94.0  PLT 264 299 341   Cardiac Enzymes:  Recent Labs  05/08/15 1344 06/26/15 0730  CKTOTAL 155  --   TROPONINI  --  <0.03   BNP: Invalid input(s): POCBNP No results found for: HGBA1C Lab Results  Component  Value Date   TSH 13.000 (H) 05/08/2011   No results found for: VITAMINB12 No results found for: FOLATE No results found for: IRON, TIBC, FERRITIN  Imaging and Procedures obtained prior to SNF admission: No results found.  Assessment/Plan  S/P Complicated Acute Pancreatitis.  Patient is doing well now. Prognosis still stays Guarded. Continue J tube feeding. Patient tolerating PO liquids. Discussed in detail with dietician about his needs.Follow up with GI in [facility]. His last Na was 132 K 4.5 Albumin was 2.4. Liver enzymes are normal. MG 1.9  Pulmonary Embolism  Continue Eliquis. To evaluate after 3 months if patient gets more ambulatory.  Hypothyrodism  Couldn't find any TSH. Will repeat with other Blood work. Continue same dose of thyroid.  Anemia  Last Hgb was 9.9 Iron studies showed increase ferritin.Folate and Vit B12 was normal.Will folllow up with labs in 1 week.  Labs ordered  Repeat CMP and serum Magnesium with CBC in one week.

## 2015-09-18 ENCOUNTER — Encounter (HOSPITAL_COMMUNITY)
Admission: RE | Admit: 2015-09-18 | Discharge: 2015-09-18 | Disposition: A | Discharge status: Home / Self Care | Payer: Medicare Other | Source: Skilled Nursing Facility | Attending: Internal Medicine | Admitting: Internal Medicine

## 2015-09-18 DIAGNOSIS — K859 Acute pancreatitis without necrosis or infection, unspecified: Secondary | ICD-10-CM | POA: Insufficient documentation

## 2015-09-18 DIAGNOSIS — N179 Acute kidney failure, unspecified: Secondary | ICD-10-CM | POA: Diagnosis not present

## 2015-09-18 DIAGNOSIS — M069 Rheumatoid arthritis, unspecified: Secondary | ICD-10-CM | POA: Diagnosis not present

## 2015-09-18 DIAGNOSIS — Z515 Encounter for palliative care: Secondary | ICD-10-CM | POA: Diagnosis not present

## 2015-09-18 DIAGNOSIS — R1031 Right lower quadrant pain: Secondary | ICD-10-CM | POA: Diagnosis not present

## 2015-09-18 DIAGNOSIS — R52 Pain, unspecified: Secondary | ICD-10-CM | POA: Diagnosis not present

## 2015-09-18 DIAGNOSIS — Z978 Presence of other specified devices: Secondary | ICD-10-CM | POA: Diagnosis not present

## 2015-09-18 DIAGNOSIS — K838 Other specified diseases of biliary tract: Secondary | ICD-10-CM | POA: Diagnosis not present

## 2015-09-18 DIAGNOSIS — R112 Nausea with vomiting, unspecified: Secondary | ICD-10-CM | POA: Diagnosis not present

## 2015-09-18 DIAGNOSIS — Z86711 Personal history of pulmonary embolism: Secondary | ICD-10-CM | POA: Diagnosis not present

## 2015-09-18 DIAGNOSIS — E860 Dehydration: Secondary | ICD-10-CM | POA: Diagnosis not present

## 2015-09-18 DIAGNOSIS — Z7901 Long term (current) use of anticoagulants: Secondary | ICD-10-CM | POA: Insufficient documentation

## 2015-09-18 DIAGNOSIS — N2889 Other specified disorders of kidney and ureter: Secondary | ICD-10-CM | POA: Diagnosis not present

## 2015-09-18 DIAGNOSIS — K863 Pseudocyst of pancreas: Secondary | ICD-10-CM | POA: Diagnosis not present

## 2015-09-18 DIAGNOSIS — K311 Adult hypertrophic pyloric stenosis: Secondary | ICD-10-CM | POA: Diagnosis not present

## 2015-09-18 DIAGNOSIS — N281 Cyst of kidney, acquired: Secondary | ICD-10-CM | POA: Diagnosis not present

## 2015-09-18 DIAGNOSIS — R748 Abnormal levels of other serum enzymes: Secondary | ICD-10-CM | POA: Diagnosis not present

## 2015-09-18 DIAGNOSIS — E871 Hypo-osmolality and hyponatremia: Secondary | ICD-10-CM | POA: Diagnosis not present

## 2015-09-18 DIAGNOSIS — N200 Calculus of kidney: Secondary | ICD-10-CM | POA: Diagnosis not present

## 2015-09-18 DIAGNOSIS — E46 Unspecified protein-calorie malnutrition: Secondary | ICD-10-CM | POA: Diagnosis not present

## 2015-09-18 DIAGNOSIS — A4151 Sepsis due to Escherichia coli [E. coli]: Secondary | ICD-10-CM | POA: Diagnosis not present

## 2015-09-18 DIAGNOSIS — N132 Hydronephrosis with renal and ureteral calculous obstruction: Secondary | ICD-10-CM | POA: Diagnosis not present

## 2015-09-18 DIAGNOSIS — Z4659 Encounter for fitting and adjustment of other gastrointestinal appliance and device: Secondary | ICD-10-CM | POA: Diagnosis not present

## 2015-09-18 DIAGNOSIS — N1330 Unspecified hydronephrosis: Secondary | ICD-10-CM | POA: Diagnosis not present

## 2015-09-18 DIAGNOSIS — Z980 Intestinal bypass and anastomosis status: Secondary | ICD-10-CM | POA: Diagnosis not present

## 2015-09-18 DIAGNOSIS — M869 Osteomyelitis, unspecified: Secondary | ICD-10-CM | POA: Diagnosis not present

## 2015-09-18 DIAGNOSIS — I82409 Acute embolism and thrombosis of unspecified deep veins of unspecified lower extremity: Secondary | ICD-10-CM | POA: Diagnosis not present

## 2015-09-18 DIAGNOSIS — K862 Cyst of pancreas: Secondary | ICD-10-CM | POA: Diagnosis not present

## 2015-09-18 DIAGNOSIS — R55 Syncope and collapse: Secondary | ICD-10-CM | POA: Diagnosis not present

## 2015-09-18 DIAGNOSIS — K8591 Acute pancreatitis with uninfected necrosis, unspecified: Secondary | ICD-10-CM | POA: Diagnosis not present

## 2015-09-18 DIAGNOSIS — K8689 Other specified diseases of pancreas: Secondary | ICD-10-CM | POA: Diagnosis not present

## 2015-09-18 DIAGNOSIS — K8511 Biliary acute pancreatitis with uninfected necrosis: Secondary | ICD-10-CM | POA: Diagnosis not present

## 2015-09-18 DIAGNOSIS — R188 Other ascites: Secondary | ICD-10-CM | POA: Diagnosis not present

## 2015-09-18 DIAGNOSIS — Z434 Encounter for attention to other artificial openings of digestive tract: Secondary | ICD-10-CM | POA: Diagnosis not present

## 2015-09-18 DIAGNOSIS — K851 Biliary acute pancreatitis without necrosis or infection: Secondary | ICD-10-CM | POA: Diagnosis not present

## 2015-09-18 DIAGNOSIS — E039 Hypothyroidism, unspecified: Secondary | ICD-10-CM | POA: Diagnosis not present

## 2015-09-18 DIAGNOSIS — K830 Cholangitis: Secondary | ICD-10-CM | POA: Diagnosis not present

## 2015-09-18 DIAGNOSIS — R197 Diarrhea, unspecified: Secondary | ICD-10-CM | POA: Diagnosis not present

## 2015-09-18 DIAGNOSIS — K6819 Other retroperitoneal abscess: Secondary | ICD-10-CM | POA: Diagnosis not present

## 2015-09-18 DIAGNOSIS — E876 Hypokalemia: Secondary | ICD-10-CM | POA: Diagnosis not present

## 2015-09-18 DIAGNOSIS — I959 Hypotension, unspecified: Secondary | ICD-10-CM | POA: Diagnosis not present

## 2015-09-18 DIAGNOSIS — E43 Unspecified severe protein-calorie malnutrition: Secondary | ICD-10-CM | POA: Diagnosis not present

## 2015-09-18 DIAGNOSIS — Z9181 History of falling: Secondary | ICD-10-CM | POA: Diagnosis not present

## 2015-09-18 DIAGNOSIS — D649 Anemia, unspecified: Secondary | ICD-10-CM | POA: Diagnosis not present

## 2015-09-18 DIAGNOSIS — K315 Obstruction of duodenum: Secondary | ICD-10-CM | POA: Diagnosis not present

## 2015-09-18 DIAGNOSIS — R633 Feeding difficulties: Secondary | ICD-10-CM | POA: Diagnosis not present

## 2015-09-18 DIAGNOSIS — K831 Obstruction of bile duct: Secondary | ICD-10-CM | POA: Diagnosis not present

## 2015-09-18 DIAGNOSIS — I2699 Other pulmonary embolism without acute cor pulmonale: Secondary | ICD-10-CM | POA: Diagnosis not present

## 2015-09-27 DIAGNOSIS — I959 Hypotension, unspecified: Secondary | ICD-10-CM | POA: Diagnosis not present

## 2015-09-27 DIAGNOSIS — R633 Feeding difficulties: Secondary | ICD-10-CM | POA: Diagnosis not present

## 2015-09-27 DIAGNOSIS — R197 Diarrhea, unspecified: Secondary | ICD-10-CM | POA: Diagnosis not present

## 2015-09-27 DIAGNOSIS — I82409 Acute embolism and thrombosis of unspecified deep veins of unspecified lower extremity: Secondary | ICD-10-CM | POA: Diagnosis not present

## 2015-09-27 DIAGNOSIS — K8591 Acute pancreatitis with uninfected necrosis, unspecified: Secondary | ICD-10-CM | POA: Diagnosis not present

## 2015-09-28 DIAGNOSIS — R197 Diarrhea, unspecified: Secondary | ICD-10-CM | POA: Diagnosis not present

## 2015-09-28 DIAGNOSIS — D649 Anemia, unspecified: Secondary | ICD-10-CM | POA: Diagnosis not present

## 2015-09-28 DIAGNOSIS — R748 Abnormal levels of other serum enzymes: Secondary | ICD-10-CM | POA: Diagnosis not present

## 2015-09-28 DIAGNOSIS — K859 Acute pancreatitis without necrosis or infection, unspecified: Secondary | ICD-10-CM | POA: Diagnosis not present

## 2015-09-28 DIAGNOSIS — I959 Hypotension, unspecified: Secondary | ICD-10-CM | POA: Diagnosis not present

## 2015-09-29 DIAGNOSIS — R633 Feeding difficulties: Secondary | ICD-10-CM | POA: Diagnosis not present

## 2015-09-29 DIAGNOSIS — R188 Other ascites: Secondary | ICD-10-CM | POA: Diagnosis not present

## 2015-09-29 DIAGNOSIS — D649 Anemia, unspecified: Secondary | ICD-10-CM | POA: Diagnosis not present

## 2015-09-29 DIAGNOSIS — I959 Hypotension, unspecified: Secondary | ICD-10-CM | POA: Diagnosis not present

## 2015-09-29 DIAGNOSIS — K859 Acute pancreatitis without necrosis or infection, unspecified: Secondary | ICD-10-CM | POA: Diagnosis not present

## 2015-09-29 DIAGNOSIS — R197 Diarrhea, unspecified: Secondary | ICD-10-CM | POA: Diagnosis not present

## 2015-09-29 DIAGNOSIS — N281 Cyst of kidney, acquired: Secondary | ICD-10-CM | POA: Diagnosis not present

## 2015-09-29 DIAGNOSIS — R748 Abnormal levels of other serum enzymes: Secondary | ICD-10-CM | POA: Diagnosis not present

## 2015-09-29 DIAGNOSIS — N132 Hydronephrosis with renal and ureteral calculous obstruction: Secondary | ICD-10-CM | POA: Diagnosis not present

## 2015-09-30 DIAGNOSIS — R748 Abnormal levels of other serum enzymes: Secondary | ICD-10-CM | POA: Diagnosis not present

## 2015-09-30 DIAGNOSIS — K859 Acute pancreatitis without necrosis or infection, unspecified: Secondary | ICD-10-CM | POA: Diagnosis not present

## 2015-09-30 DIAGNOSIS — I959 Hypotension, unspecified: Secondary | ICD-10-CM | POA: Diagnosis not present

## 2015-09-30 DIAGNOSIS — D649 Anemia, unspecified: Secondary | ICD-10-CM | POA: Diagnosis not present

## 2015-10-01 DIAGNOSIS — K8689 Other specified diseases of pancreas: Secondary | ICD-10-CM | POA: Diagnosis not present

## 2015-10-01 DIAGNOSIS — R197 Diarrhea, unspecified: Secondary | ICD-10-CM | POA: Diagnosis not present

## 2015-10-01 DIAGNOSIS — R748 Abnormal levels of other serum enzymes: Secondary | ICD-10-CM | POA: Diagnosis not present

## 2015-10-01 DIAGNOSIS — Z4659 Encounter for fitting and adjustment of other gastrointestinal appliance and device: Secondary | ICD-10-CM | POA: Diagnosis not present

## 2015-10-01 DIAGNOSIS — K8511 Biliary acute pancreatitis with uninfected necrosis: Secondary | ICD-10-CM | POA: Diagnosis not present

## 2015-10-01 DIAGNOSIS — K859 Acute pancreatitis without necrosis or infection, unspecified: Secondary | ICD-10-CM | POA: Diagnosis not present

## 2015-10-01 DIAGNOSIS — E871 Hypo-osmolality and hyponatremia: Secondary | ICD-10-CM | POA: Diagnosis not present

## 2015-10-01 DIAGNOSIS — I959 Hypotension, unspecified: Secondary | ICD-10-CM | POA: Diagnosis not present

## 2015-10-02 DIAGNOSIS — E871 Hypo-osmolality and hyponatremia: Secondary | ICD-10-CM | POA: Diagnosis not present

## 2015-10-02 DIAGNOSIS — K8689 Other specified diseases of pancreas: Secondary | ICD-10-CM | POA: Diagnosis not present

## 2015-10-02 DIAGNOSIS — K6819 Other retroperitoneal abscess: Secondary | ICD-10-CM | POA: Diagnosis not present

## 2015-10-02 DIAGNOSIS — K8511 Biliary acute pancreatitis with uninfected necrosis: Secondary | ICD-10-CM | POA: Diagnosis not present

## 2015-10-02 DIAGNOSIS — R197 Diarrhea, unspecified: Secondary | ICD-10-CM | POA: Diagnosis not present

## 2015-10-02 DIAGNOSIS — D649 Anemia, unspecified: Secondary | ICD-10-CM | POA: Diagnosis not present

## 2015-10-02 DIAGNOSIS — I959 Hypotension, unspecified: Secondary | ICD-10-CM | POA: Diagnosis not present

## 2015-10-03 DIAGNOSIS — E871 Hypo-osmolality and hyponatremia: Secondary | ICD-10-CM | POA: Diagnosis not present

## 2015-10-03 DIAGNOSIS — D649 Anemia, unspecified: Secondary | ICD-10-CM | POA: Diagnosis not present

## 2015-10-03 DIAGNOSIS — R748 Abnormal levels of other serum enzymes: Secondary | ICD-10-CM | POA: Diagnosis not present

## 2015-10-03 DIAGNOSIS — K6819 Other retroperitoneal abscess: Secondary | ICD-10-CM | POA: Diagnosis not present

## 2015-10-04 DIAGNOSIS — K6819 Other retroperitoneal abscess: Secondary | ICD-10-CM | POA: Diagnosis not present

## 2015-10-04 DIAGNOSIS — K8689 Other specified diseases of pancreas: Secondary | ICD-10-CM | POA: Diagnosis not present

## 2015-10-04 DIAGNOSIS — R748 Abnormal levels of other serum enzymes: Secondary | ICD-10-CM | POA: Diagnosis not present

## 2015-10-04 DIAGNOSIS — K851 Biliary acute pancreatitis without necrosis or infection: Secondary | ICD-10-CM | POA: Diagnosis not present

## 2015-10-04 DIAGNOSIS — D649 Anemia, unspecified: Secondary | ICD-10-CM | POA: Diagnosis not present

## 2015-10-04 DIAGNOSIS — E871 Hypo-osmolality and hyponatremia: Secondary | ICD-10-CM | POA: Diagnosis not present

## 2015-10-05 DIAGNOSIS — R748 Abnormal levels of other serum enzymes: Secondary | ICD-10-CM | POA: Diagnosis not present

## 2015-10-05 DIAGNOSIS — K6819 Other retroperitoneal abscess: Secondary | ICD-10-CM | POA: Diagnosis not present

## 2015-10-05 DIAGNOSIS — D649 Anemia, unspecified: Secondary | ICD-10-CM | POA: Diagnosis not present

## 2015-10-05 DIAGNOSIS — E871 Hypo-osmolality and hyponatremia: Secondary | ICD-10-CM | POA: Diagnosis not present

## 2015-10-06 DIAGNOSIS — E871 Hypo-osmolality and hyponatremia: Secondary | ICD-10-CM | POA: Diagnosis not present

## 2015-10-06 DIAGNOSIS — R748 Abnormal levels of other serum enzymes: Secondary | ICD-10-CM | POA: Diagnosis not present

## 2015-10-06 DIAGNOSIS — K6819 Other retroperitoneal abscess: Secondary | ICD-10-CM | POA: Diagnosis not present

## 2015-10-06 DIAGNOSIS — R197 Diarrhea, unspecified: Secondary | ICD-10-CM | POA: Diagnosis not present

## 2015-10-07 DIAGNOSIS — E871 Hypo-osmolality and hyponatremia: Secondary | ICD-10-CM | POA: Diagnosis not present

## 2015-10-07 DIAGNOSIS — E46 Unspecified protein-calorie malnutrition: Secondary | ICD-10-CM | POA: Diagnosis not present

## 2015-10-07 DIAGNOSIS — K6819 Other retroperitoneal abscess: Secondary | ICD-10-CM | POA: Diagnosis not present

## 2015-10-07 DIAGNOSIS — R1031 Right lower quadrant pain: Secondary | ICD-10-CM | POA: Diagnosis not present

## 2015-10-07 DIAGNOSIS — R112 Nausea with vomiting, unspecified: Secondary | ICD-10-CM | POA: Diagnosis not present

## 2015-10-07 DIAGNOSIS — R197 Diarrhea, unspecified: Secondary | ICD-10-CM | POA: Diagnosis not present

## 2015-10-08 DIAGNOSIS — K6819 Other retroperitoneal abscess: Secondary | ICD-10-CM | POA: Diagnosis not present

## 2015-10-08 DIAGNOSIS — E46 Unspecified protein-calorie malnutrition: Secondary | ICD-10-CM | POA: Diagnosis not present

## 2015-10-08 DIAGNOSIS — K8689 Other specified diseases of pancreas: Secondary | ICD-10-CM | POA: Diagnosis not present

## 2015-10-08 DIAGNOSIS — R112 Nausea with vomiting, unspecified: Secondary | ICD-10-CM | POA: Diagnosis not present

## 2015-10-08 DIAGNOSIS — R188 Other ascites: Secondary | ICD-10-CM | POA: Diagnosis not present

## 2015-10-08 DIAGNOSIS — E871 Hypo-osmolality and hyponatremia: Secondary | ICD-10-CM | POA: Diagnosis not present

## 2015-10-08 DIAGNOSIS — N132 Hydronephrosis with renal and ureteral calculous obstruction: Secondary | ICD-10-CM | POA: Diagnosis not present

## 2015-10-08 DIAGNOSIS — K862 Cyst of pancreas: Secondary | ICD-10-CM | POA: Diagnosis not present

## 2015-10-08 DIAGNOSIS — R197 Diarrhea, unspecified: Secondary | ICD-10-CM | POA: Diagnosis not present

## 2015-10-09 DIAGNOSIS — E46 Unspecified protein-calorie malnutrition: Secondary | ICD-10-CM | POA: Diagnosis not present

## 2015-10-09 DIAGNOSIS — R112 Nausea with vomiting, unspecified: Secondary | ICD-10-CM | POA: Diagnosis not present

## 2015-10-09 DIAGNOSIS — K6819 Other retroperitoneal abscess: Secondary | ICD-10-CM | POA: Diagnosis not present

## 2015-10-09 DIAGNOSIS — R197 Diarrhea, unspecified: Secondary | ICD-10-CM | POA: Diagnosis not present

## 2015-10-09 DIAGNOSIS — E871 Hypo-osmolality and hyponatremia: Secondary | ICD-10-CM | POA: Diagnosis not present

## 2015-10-10 DIAGNOSIS — E46 Unspecified protein-calorie malnutrition: Secondary | ICD-10-CM | POA: Diagnosis not present

## 2015-10-10 DIAGNOSIS — K6819 Other retroperitoneal abscess: Secondary | ICD-10-CM | POA: Diagnosis not present

## 2015-10-10 DIAGNOSIS — R197 Diarrhea, unspecified: Secondary | ICD-10-CM | POA: Diagnosis not present

## 2015-10-10 DIAGNOSIS — E871 Hypo-osmolality and hyponatremia: Secondary | ICD-10-CM | POA: Diagnosis not present

## 2015-10-10 DIAGNOSIS — R112 Nausea with vomiting, unspecified: Secondary | ICD-10-CM | POA: Diagnosis not present

## 2015-10-11 DIAGNOSIS — E46 Unspecified protein-calorie malnutrition: Secondary | ICD-10-CM | POA: Diagnosis not present

## 2015-10-11 DIAGNOSIS — K315 Obstruction of duodenum: Secondary | ICD-10-CM | POA: Diagnosis not present

## 2015-10-11 DIAGNOSIS — R112 Nausea with vomiting, unspecified: Secondary | ICD-10-CM | POA: Diagnosis not present

## 2015-10-11 DIAGNOSIS — K863 Pseudocyst of pancreas: Secondary | ICD-10-CM | POA: Diagnosis not present

## 2015-10-11 DIAGNOSIS — Z4659 Encounter for fitting and adjustment of other gastrointestinal appliance and device: Secondary | ICD-10-CM | POA: Diagnosis not present

## 2015-10-11 DIAGNOSIS — E871 Hypo-osmolality and hyponatremia: Secondary | ICD-10-CM | POA: Diagnosis not present

## 2015-10-11 DIAGNOSIS — R197 Diarrhea, unspecified: Secondary | ICD-10-CM | POA: Diagnosis not present

## 2015-10-11 DIAGNOSIS — K838 Other specified diseases of biliary tract: Secondary | ICD-10-CM | POA: Diagnosis not present

## 2015-10-11 DIAGNOSIS — K6819 Other retroperitoneal abscess: Secondary | ICD-10-CM | POA: Diagnosis not present

## 2015-10-11 DIAGNOSIS — K831 Obstruction of bile duct: Secondary | ICD-10-CM | POA: Diagnosis not present

## 2015-10-12 DIAGNOSIS — R112 Nausea with vomiting, unspecified: Secondary | ICD-10-CM | POA: Diagnosis not present

## 2015-10-12 DIAGNOSIS — K6819 Other retroperitoneal abscess: Secondary | ICD-10-CM | POA: Diagnosis not present

## 2015-10-12 DIAGNOSIS — R197 Diarrhea, unspecified: Secondary | ICD-10-CM | POA: Diagnosis not present

## 2015-10-12 DIAGNOSIS — E46 Unspecified protein-calorie malnutrition: Secondary | ICD-10-CM | POA: Diagnosis not present

## 2015-10-12 DIAGNOSIS — E871 Hypo-osmolality and hyponatremia: Secondary | ICD-10-CM | POA: Diagnosis not present

## 2015-10-13 DIAGNOSIS — R197 Diarrhea, unspecified: Secondary | ICD-10-CM | POA: Diagnosis not present

## 2015-10-13 DIAGNOSIS — K6819 Other retroperitoneal abscess: Secondary | ICD-10-CM | POA: Diagnosis not present

## 2015-10-13 DIAGNOSIS — R112 Nausea with vomiting, unspecified: Secondary | ICD-10-CM | POA: Diagnosis not present

## 2015-10-13 DIAGNOSIS — E871 Hypo-osmolality and hyponatremia: Secondary | ICD-10-CM | POA: Diagnosis not present

## 2015-10-13 DIAGNOSIS — E46 Unspecified protein-calorie malnutrition: Secondary | ICD-10-CM | POA: Diagnosis not present

## 2015-10-14 DIAGNOSIS — K311 Adult hypertrophic pyloric stenosis: Secondary | ICD-10-CM | POA: Diagnosis not present

## 2015-10-14 DIAGNOSIS — R197 Diarrhea, unspecified: Secondary | ICD-10-CM | POA: Diagnosis not present

## 2015-10-14 DIAGNOSIS — K8689 Other specified diseases of pancreas: Secondary | ICD-10-CM | POA: Diagnosis not present

## 2015-10-14 DIAGNOSIS — R112 Nausea with vomiting, unspecified: Secondary | ICD-10-CM | POA: Diagnosis not present

## 2015-10-14 DIAGNOSIS — E871 Hypo-osmolality and hyponatremia: Secondary | ICD-10-CM | POA: Diagnosis not present

## 2015-10-14 DIAGNOSIS — K6819 Other retroperitoneal abscess: Secondary | ICD-10-CM | POA: Diagnosis not present

## 2015-10-14 DIAGNOSIS — E46 Unspecified protein-calorie malnutrition: Secondary | ICD-10-CM | POA: Diagnosis not present

## 2015-10-15 DIAGNOSIS — E46 Unspecified protein-calorie malnutrition: Secondary | ICD-10-CM | POA: Diagnosis not present

## 2015-10-15 DIAGNOSIS — K6819 Other retroperitoneal abscess: Secondary | ICD-10-CM | POA: Diagnosis not present

## 2015-10-15 DIAGNOSIS — R112 Nausea with vomiting, unspecified: Secondary | ICD-10-CM | POA: Diagnosis not present

## 2015-10-15 DIAGNOSIS — R197 Diarrhea, unspecified: Secondary | ICD-10-CM | POA: Diagnosis not present

## 2015-10-15 DIAGNOSIS — E871 Hypo-osmolality and hyponatremia: Secondary | ICD-10-CM | POA: Diagnosis not present

## 2015-10-16 DIAGNOSIS — K6819 Other retroperitoneal abscess: Secondary | ICD-10-CM | POA: Diagnosis not present

## 2015-10-16 DIAGNOSIS — E46 Unspecified protein-calorie malnutrition: Secondary | ICD-10-CM | POA: Diagnosis not present

## 2015-10-16 DIAGNOSIS — R112 Nausea with vomiting, unspecified: Secondary | ICD-10-CM | POA: Diagnosis not present

## 2015-10-16 DIAGNOSIS — K315 Obstruction of duodenum: Secondary | ICD-10-CM | POA: Diagnosis not present

## 2015-10-16 DIAGNOSIS — E871 Hypo-osmolality and hyponatremia: Secondary | ICD-10-CM | POA: Diagnosis not present

## 2015-10-16 DIAGNOSIS — K8689 Other specified diseases of pancreas: Secondary | ICD-10-CM | POA: Diagnosis not present

## 2015-10-16 DIAGNOSIS — R197 Diarrhea, unspecified: Secondary | ICD-10-CM | POA: Diagnosis not present

## 2015-10-17 DIAGNOSIS — R112 Nausea with vomiting, unspecified: Secondary | ICD-10-CM | POA: Diagnosis not present

## 2015-10-17 DIAGNOSIS — K315 Obstruction of duodenum: Secondary | ICD-10-CM | POA: Diagnosis not present

## 2015-10-17 DIAGNOSIS — E46 Unspecified protein-calorie malnutrition: Secondary | ICD-10-CM | POA: Diagnosis not present

## 2015-10-17 DIAGNOSIS — K6819 Other retroperitoneal abscess: Secondary | ICD-10-CM | POA: Diagnosis not present

## 2015-10-17 DIAGNOSIS — E871 Hypo-osmolality and hyponatremia: Secondary | ICD-10-CM | POA: Diagnosis not present

## 2015-10-17 DIAGNOSIS — R197 Diarrhea, unspecified: Secondary | ICD-10-CM | POA: Diagnosis not present

## 2015-10-17 DIAGNOSIS — K8689 Other specified diseases of pancreas: Secondary | ICD-10-CM | POA: Diagnosis not present

## 2015-10-18 DIAGNOSIS — N200 Calculus of kidney: Secondary | ICD-10-CM | POA: Diagnosis not present

## 2015-10-18 DIAGNOSIS — K8689 Other specified diseases of pancreas: Secondary | ICD-10-CM | POA: Diagnosis not present

## 2015-10-18 DIAGNOSIS — K6819 Other retroperitoneal abscess: Secondary | ICD-10-CM | POA: Diagnosis not present

## 2015-10-18 DIAGNOSIS — E871 Hypo-osmolality and hyponatremia: Secondary | ICD-10-CM | POA: Diagnosis not present

## 2015-10-18 DIAGNOSIS — R197 Diarrhea, unspecified: Secondary | ICD-10-CM | POA: Diagnosis not present

## 2015-10-18 DIAGNOSIS — E46 Unspecified protein-calorie malnutrition: Secondary | ICD-10-CM | POA: Diagnosis not present

## 2015-10-18 DIAGNOSIS — R188 Other ascites: Secondary | ICD-10-CM | POA: Diagnosis not present

## 2015-10-18 DIAGNOSIS — R112 Nausea with vomiting, unspecified: Secondary | ICD-10-CM | POA: Diagnosis not present

## 2015-10-19 DIAGNOSIS — Z86711 Personal history of pulmonary embolism: Secondary | ICD-10-CM | POA: Diagnosis not present

## 2015-10-19 DIAGNOSIS — E871 Hypo-osmolality and hyponatremia: Secondary | ICD-10-CM | POA: Diagnosis not present

## 2015-10-19 DIAGNOSIS — R112 Nausea with vomiting, unspecified: Secondary | ICD-10-CM | POA: Diagnosis not present

## 2015-10-19 DIAGNOSIS — K6819 Other retroperitoneal abscess: Secondary | ICD-10-CM | POA: Diagnosis not present

## 2015-10-19 DIAGNOSIS — E46 Unspecified protein-calorie malnutrition: Secondary | ICD-10-CM | POA: Diagnosis not present

## 2015-10-20 DIAGNOSIS — E46 Unspecified protein-calorie malnutrition: Secondary | ICD-10-CM | POA: Diagnosis not present

## 2015-10-20 DIAGNOSIS — R112 Nausea with vomiting, unspecified: Secondary | ICD-10-CM | POA: Diagnosis not present

## 2015-10-20 DIAGNOSIS — K6819 Other retroperitoneal abscess: Secondary | ICD-10-CM | POA: Diagnosis not present

## 2015-10-20 DIAGNOSIS — Z86711 Personal history of pulmonary embolism: Secondary | ICD-10-CM | POA: Diagnosis not present

## 2015-10-20 DIAGNOSIS — E871 Hypo-osmolality and hyponatremia: Secondary | ICD-10-CM | POA: Diagnosis not present

## 2015-10-21 DIAGNOSIS — K8689 Other specified diseases of pancreas: Secondary | ICD-10-CM | POA: Diagnosis not present

## 2015-10-21 DIAGNOSIS — E46 Unspecified protein-calorie malnutrition: Secondary | ICD-10-CM | POA: Diagnosis not present

## 2015-10-21 DIAGNOSIS — K6819 Other retroperitoneal abscess: Secondary | ICD-10-CM | POA: Diagnosis not present

## 2015-10-21 DIAGNOSIS — E871 Hypo-osmolality and hyponatremia: Secondary | ICD-10-CM | POA: Diagnosis not present

## 2015-10-21 DIAGNOSIS — R112 Nausea with vomiting, unspecified: Secondary | ICD-10-CM | POA: Diagnosis not present

## 2015-10-21 DIAGNOSIS — Z978 Presence of other specified devices: Secondary | ICD-10-CM | POA: Diagnosis not present

## 2015-10-21 DIAGNOSIS — Z86711 Personal history of pulmonary embolism: Secondary | ICD-10-CM | POA: Diagnosis not present

## 2015-10-22 DIAGNOSIS — E860 Dehydration: Secondary | ICD-10-CM | POA: Diagnosis not present

## 2015-10-22 DIAGNOSIS — K6819 Other retroperitoneal abscess: Secondary | ICD-10-CM | POA: Diagnosis not present

## 2015-10-22 DIAGNOSIS — R112 Nausea with vomiting, unspecified: Secondary | ICD-10-CM | POA: Diagnosis not present

## 2015-10-22 DIAGNOSIS — E46 Unspecified protein-calorie malnutrition: Secondary | ICD-10-CM | POA: Diagnosis not present

## 2015-10-22 DIAGNOSIS — Z980 Intestinal bypass and anastomosis status: Secondary | ICD-10-CM | POA: Diagnosis not present

## 2015-10-22 DIAGNOSIS — R52 Pain, unspecified: Secondary | ICD-10-CM | POA: Diagnosis not present

## 2015-10-22 DIAGNOSIS — K315 Obstruction of duodenum: Secondary | ICD-10-CM | POA: Diagnosis not present

## 2015-10-22 DIAGNOSIS — K8689 Other specified diseases of pancreas: Secondary | ICD-10-CM | POA: Diagnosis not present

## 2015-10-23 DIAGNOSIS — E860 Dehydration: Secondary | ICD-10-CM | POA: Diagnosis not present

## 2015-10-23 DIAGNOSIS — R112 Nausea with vomiting, unspecified: Secondary | ICD-10-CM | POA: Diagnosis not present

## 2015-10-23 DIAGNOSIS — K6819 Other retroperitoneal abscess: Secondary | ICD-10-CM | POA: Diagnosis not present

## 2015-10-23 DIAGNOSIS — R52 Pain, unspecified: Secondary | ICD-10-CM | POA: Diagnosis not present

## 2015-10-23 DIAGNOSIS — K315 Obstruction of duodenum: Secondary | ICD-10-CM | POA: Diagnosis not present

## 2015-10-23 DIAGNOSIS — R55 Syncope and collapse: Secondary | ICD-10-CM | POA: Diagnosis not present

## 2015-10-23 DIAGNOSIS — Z515 Encounter for palliative care: Secondary | ICD-10-CM | POA: Diagnosis not present

## 2015-10-23 DIAGNOSIS — Z980 Intestinal bypass and anastomosis status: Secondary | ICD-10-CM | POA: Diagnosis not present

## 2015-10-23 DIAGNOSIS — E46 Unspecified protein-calorie malnutrition: Secondary | ICD-10-CM | POA: Diagnosis not present

## 2015-10-23 DIAGNOSIS — K8689 Other specified diseases of pancreas: Secondary | ICD-10-CM | POA: Diagnosis not present

## 2015-10-24 DIAGNOSIS — Z980 Intestinal bypass and anastomosis status: Secondary | ICD-10-CM | POA: Diagnosis not present

## 2015-10-24 DIAGNOSIS — K315 Obstruction of duodenum: Secondary | ICD-10-CM | POA: Diagnosis not present

## 2015-10-24 DIAGNOSIS — Z4659 Encounter for fitting and adjustment of other gastrointestinal appliance and device: Secondary | ICD-10-CM | POA: Diagnosis not present

## 2015-10-24 DIAGNOSIS — K8689 Other specified diseases of pancreas: Secondary | ICD-10-CM | POA: Diagnosis not present

## 2015-10-24 DIAGNOSIS — R112 Nausea with vomiting, unspecified: Secondary | ICD-10-CM | POA: Diagnosis not present

## 2015-10-24 DIAGNOSIS — E46 Unspecified protein-calorie malnutrition: Secondary | ICD-10-CM | POA: Diagnosis not present

## 2015-10-24 DIAGNOSIS — K6819 Other retroperitoneal abscess: Secondary | ICD-10-CM | POA: Diagnosis not present

## 2015-10-25 DIAGNOSIS — M869 Osteomyelitis, unspecified: Secondary | ICD-10-CM | POA: Diagnosis not present

## 2015-10-25 DIAGNOSIS — M069 Rheumatoid arthritis, unspecified: Secondary | ICD-10-CM | POA: Diagnosis not present

## 2015-10-25 DIAGNOSIS — K315 Obstruction of duodenum: Secondary | ICD-10-CM | POA: Diagnosis not present

## 2015-10-25 DIAGNOSIS — K8689 Other specified diseases of pancreas: Secondary | ICD-10-CM | POA: Diagnosis not present

## 2015-10-25 DIAGNOSIS — E876 Hypokalemia: Secondary | ICD-10-CM | POA: Diagnosis not present

## 2015-10-25 DIAGNOSIS — R197 Diarrhea, unspecified: Secondary | ICD-10-CM | POA: Diagnosis not present

## 2015-10-26 DIAGNOSIS — E46 Unspecified protein-calorie malnutrition: Secondary | ICD-10-CM | POA: Diagnosis not present

## 2015-10-26 DIAGNOSIS — E871 Hypo-osmolality and hyponatremia: Secondary | ICD-10-CM | POA: Diagnosis not present

## 2015-10-26 DIAGNOSIS — K6819 Other retroperitoneal abscess: Secondary | ICD-10-CM | POA: Diagnosis not present

## 2015-10-26 DIAGNOSIS — R112 Nausea with vomiting, unspecified: Secondary | ICD-10-CM | POA: Diagnosis not present

## 2015-10-26 DIAGNOSIS — K8689 Other specified diseases of pancreas: Secondary | ICD-10-CM | POA: Diagnosis not present

## 2015-10-27 DIAGNOSIS — E46 Unspecified protein-calorie malnutrition: Secondary | ICD-10-CM | POA: Diagnosis not present

## 2015-10-27 DIAGNOSIS — K8689 Other specified diseases of pancreas: Secondary | ICD-10-CM | POA: Diagnosis not present

## 2015-10-27 DIAGNOSIS — R197 Diarrhea, unspecified: Secondary | ICD-10-CM | POA: Diagnosis not present

## 2015-10-27 DIAGNOSIS — K6819 Other retroperitoneal abscess: Secondary | ICD-10-CM | POA: Diagnosis not present

## 2015-10-27 DIAGNOSIS — R112 Nausea with vomiting, unspecified: Secondary | ICD-10-CM | POA: Diagnosis not present

## 2015-10-28 DIAGNOSIS — K831 Obstruction of bile duct: Secondary | ICD-10-CM | POA: Diagnosis not present

## 2015-10-28 DIAGNOSIS — N1330 Unspecified hydronephrosis: Secondary | ICD-10-CM | POA: Diagnosis not present

## 2015-10-28 DIAGNOSIS — K6819 Other retroperitoneal abscess: Secondary | ICD-10-CM | POA: Diagnosis not present

## 2015-10-28 DIAGNOSIS — R197 Diarrhea, unspecified: Secondary | ICD-10-CM | POA: Diagnosis not present

## 2015-10-28 DIAGNOSIS — K315 Obstruction of duodenum: Secondary | ICD-10-CM | POA: Diagnosis not present

## 2015-10-28 DIAGNOSIS — E46 Unspecified protein-calorie malnutrition: Secondary | ICD-10-CM | POA: Diagnosis not present

## 2015-10-28 DIAGNOSIS — K8689 Other specified diseases of pancreas: Secondary | ICD-10-CM | POA: Diagnosis not present

## 2015-10-28 DIAGNOSIS — R112 Nausea with vomiting, unspecified: Secondary | ICD-10-CM | POA: Diagnosis not present

## 2015-10-28 DIAGNOSIS — N2889 Other specified disorders of kidney and ureter: Secondary | ICD-10-CM | POA: Diagnosis not present

## 2015-10-29 DIAGNOSIS — K315 Obstruction of duodenum: Secondary | ICD-10-CM | POA: Diagnosis not present

## 2015-10-29 DIAGNOSIS — R112 Nausea with vomiting, unspecified: Secondary | ICD-10-CM | POA: Diagnosis not present

## 2015-10-29 DIAGNOSIS — E871 Hypo-osmolality and hyponatremia: Secondary | ICD-10-CM | POA: Diagnosis not present

## 2015-10-29 DIAGNOSIS — K831 Obstruction of bile duct: Secondary | ICD-10-CM | POA: Diagnosis not present

## 2015-10-29 DIAGNOSIS — E46 Unspecified protein-calorie malnutrition: Secondary | ICD-10-CM | POA: Diagnosis not present

## 2015-10-29 DIAGNOSIS — K8689 Other specified diseases of pancreas: Secondary | ICD-10-CM | POA: Diagnosis not present

## 2015-10-29 DIAGNOSIS — K6819 Other retroperitoneal abscess: Secondary | ICD-10-CM | POA: Diagnosis not present

## 2015-10-30 DIAGNOSIS — E871 Hypo-osmolality and hyponatremia: Secondary | ICD-10-CM | POA: Diagnosis not present

## 2015-10-30 DIAGNOSIS — K8689 Other specified diseases of pancreas: Secondary | ICD-10-CM | POA: Diagnosis not present

## 2015-10-30 DIAGNOSIS — K831 Obstruction of bile duct: Secondary | ICD-10-CM | POA: Diagnosis not present

## 2015-10-30 DIAGNOSIS — K6819 Other retroperitoneal abscess: Secondary | ICD-10-CM | POA: Diagnosis not present

## 2015-10-30 DIAGNOSIS — R112 Nausea with vomiting, unspecified: Secondary | ICD-10-CM | POA: Diagnosis not present

## 2015-10-30 DIAGNOSIS — K315 Obstruction of duodenum: Secondary | ICD-10-CM | POA: Diagnosis not present

## 2015-10-30 DIAGNOSIS — E46 Unspecified protein-calorie malnutrition: Secondary | ICD-10-CM | POA: Diagnosis not present

## 2015-10-31 DIAGNOSIS — K8689 Other specified diseases of pancreas: Secondary | ICD-10-CM | POA: Diagnosis not present

## 2015-10-31 DIAGNOSIS — E871 Hypo-osmolality and hyponatremia: Secondary | ICD-10-CM | POA: Diagnosis not present

## 2015-10-31 DIAGNOSIS — K6819 Other retroperitoneal abscess: Secondary | ICD-10-CM | POA: Diagnosis not present

## 2015-10-31 DIAGNOSIS — R112 Nausea with vomiting, unspecified: Secondary | ICD-10-CM | POA: Diagnosis not present

## 2015-10-31 DIAGNOSIS — K315 Obstruction of duodenum: Secondary | ICD-10-CM | POA: Diagnosis not present

## 2015-10-31 DIAGNOSIS — E46 Unspecified protein-calorie malnutrition: Secondary | ICD-10-CM | POA: Diagnosis not present

## 2015-10-31 DIAGNOSIS — K831 Obstruction of bile duct: Secondary | ICD-10-CM | POA: Diagnosis not present

## 2015-11-01 DIAGNOSIS — R112 Nausea with vomiting, unspecified: Secondary | ICD-10-CM | POA: Diagnosis not present

## 2015-11-01 DIAGNOSIS — K6819 Other retroperitoneal abscess: Secondary | ICD-10-CM | POA: Diagnosis not present

## 2015-11-01 DIAGNOSIS — E46 Unspecified protein-calorie malnutrition: Secondary | ICD-10-CM | POA: Diagnosis not present

## 2015-11-01 DIAGNOSIS — E871 Hypo-osmolality and hyponatremia: Secondary | ICD-10-CM | POA: Diagnosis not present

## 2015-11-01 DIAGNOSIS — K831 Obstruction of bile duct: Secondary | ICD-10-CM | POA: Diagnosis not present

## 2015-11-01 DIAGNOSIS — K8689 Other specified diseases of pancreas: Secondary | ICD-10-CM | POA: Diagnosis not present

## 2015-11-01 DIAGNOSIS — K315 Obstruction of duodenum: Secondary | ICD-10-CM | POA: Diagnosis not present

## 2015-11-02 DIAGNOSIS — E871 Hypo-osmolality and hyponatremia: Secondary | ICD-10-CM | POA: Diagnosis not present

## 2015-11-02 DIAGNOSIS — K6819 Other retroperitoneal abscess: Secondary | ICD-10-CM | POA: Diagnosis not present

## 2015-11-02 DIAGNOSIS — R112 Nausea with vomiting, unspecified: Secondary | ICD-10-CM | POA: Diagnosis not present

## 2015-11-02 DIAGNOSIS — E46 Unspecified protein-calorie malnutrition: Secondary | ICD-10-CM | POA: Diagnosis not present

## 2015-11-02 DIAGNOSIS — K8689 Other specified diseases of pancreas: Secondary | ICD-10-CM | POA: Diagnosis not present

## 2015-11-03 DIAGNOSIS — E46 Unspecified protein-calorie malnutrition: Secondary | ICD-10-CM | POA: Diagnosis not present

## 2015-11-03 DIAGNOSIS — K8689 Other specified diseases of pancreas: Secondary | ICD-10-CM | POA: Diagnosis not present

## 2015-11-03 DIAGNOSIS — K6819 Other retroperitoneal abscess: Secondary | ICD-10-CM | POA: Diagnosis not present

## 2015-11-03 DIAGNOSIS — E871 Hypo-osmolality and hyponatremia: Secondary | ICD-10-CM | POA: Diagnosis not present

## 2015-11-03 DIAGNOSIS — R112 Nausea with vomiting, unspecified: Secondary | ICD-10-CM | POA: Diagnosis not present

## 2015-11-04 DIAGNOSIS — R112 Nausea with vomiting, unspecified: Secondary | ICD-10-CM | POA: Diagnosis not present

## 2015-11-04 DIAGNOSIS — E871 Hypo-osmolality and hyponatremia: Secondary | ICD-10-CM | POA: Diagnosis not present

## 2015-11-04 DIAGNOSIS — K6819 Other retroperitoneal abscess: Secondary | ICD-10-CM | POA: Diagnosis not present

## 2015-11-04 DIAGNOSIS — E46 Unspecified protein-calorie malnutrition: Secondary | ICD-10-CM | POA: Diagnosis not present

## 2015-11-04 DIAGNOSIS — K831 Obstruction of bile duct: Secondary | ICD-10-CM | POA: Diagnosis not present

## 2015-11-04 DIAGNOSIS — K315 Obstruction of duodenum: Secondary | ICD-10-CM | POA: Diagnosis not present

## 2015-11-04 DIAGNOSIS — K8689 Other specified diseases of pancreas: Secondary | ICD-10-CM | POA: Diagnosis not present

## 2015-11-05 DIAGNOSIS — R112 Nausea with vomiting, unspecified: Secondary | ICD-10-CM | POA: Diagnosis not present

## 2015-11-05 DIAGNOSIS — K6819 Other retroperitoneal abscess: Secondary | ICD-10-CM | POA: Diagnosis not present

## 2015-11-05 DIAGNOSIS — K8689 Other specified diseases of pancreas: Secondary | ICD-10-CM | POA: Diagnosis not present

## 2015-11-05 DIAGNOSIS — E871 Hypo-osmolality and hyponatremia: Secondary | ICD-10-CM | POA: Diagnosis not present

## 2015-11-05 DIAGNOSIS — E46 Unspecified protein-calorie malnutrition: Secondary | ICD-10-CM | POA: Diagnosis not present

## 2015-11-06 DIAGNOSIS — R112 Nausea with vomiting, unspecified: Secondary | ICD-10-CM | POA: Diagnosis not present

## 2015-11-06 DIAGNOSIS — K8689 Other specified diseases of pancreas: Secondary | ICD-10-CM | POA: Diagnosis not present

## 2015-11-06 DIAGNOSIS — E871 Hypo-osmolality and hyponatremia: Secondary | ICD-10-CM | POA: Diagnosis not present

## 2015-11-06 DIAGNOSIS — E46 Unspecified protein-calorie malnutrition: Secondary | ICD-10-CM | POA: Diagnosis not present

## 2015-11-06 DIAGNOSIS — K6819 Other retroperitoneal abscess: Secondary | ICD-10-CM | POA: Diagnosis not present

## 2015-11-07 DIAGNOSIS — I2699 Other pulmonary embolism without acute cor pulmonale: Secondary | ICD-10-CM | POA: Diagnosis not present

## 2015-11-07 DIAGNOSIS — E039 Hypothyroidism, unspecified: Secondary | ICD-10-CM | POA: Diagnosis not present

## 2015-11-07 DIAGNOSIS — Z434 Encounter for attention to other artificial openings of digestive tract: Secondary | ICD-10-CM | POA: Diagnosis not present

## 2015-11-07 DIAGNOSIS — E43 Unspecified severe protein-calorie malnutrition: Secondary | ICD-10-CM | POA: Diagnosis not present

## 2015-11-07 DIAGNOSIS — E860 Dehydration: Secondary | ICD-10-CM | POA: Diagnosis not present

## 2015-11-07 DIAGNOSIS — K831 Obstruction of bile duct: Secondary | ICD-10-CM | POA: Diagnosis not present

## 2015-11-07 DIAGNOSIS — K315 Obstruction of duodenum: Secondary | ICD-10-CM | POA: Diagnosis not present

## 2015-11-07 DIAGNOSIS — E46 Unspecified protein-calorie malnutrition: Secondary | ICD-10-CM | POA: Diagnosis not present

## 2015-11-07 DIAGNOSIS — Z9181 History of falling: Secondary | ICD-10-CM | POA: Diagnosis not present

## 2015-11-07 DIAGNOSIS — Z86711 Personal history of pulmonary embolism: Secondary | ICD-10-CM | POA: Diagnosis not present

## 2015-11-07 DIAGNOSIS — A4151 Sepsis due to Escherichia coli [E. coli]: Secondary | ICD-10-CM | POA: Diagnosis not present

## 2015-11-08 DIAGNOSIS — A4151 Sepsis due to Escherichia coli [E. coli]: Secondary | ICD-10-CM | POA: Diagnosis not present

## 2015-11-08 DIAGNOSIS — E43 Unspecified severe protein-calorie malnutrition: Secondary | ICD-10-CM | POA: Diagnosis not present

## 2015-11-12 DIAGNOSIS — E43 Unspecified severe protein-calorie malnutrition: Secondary | ICD-10-CM | POA: Diagnosis not present

## 2015-11-12 DIAGNOSIS — A4151 Sepsis due to Escherichia coli [E. coli]: Secondary | ICD-10-CM | POA: Diagnosis not present

## 2015-11-24 ENCOUNTER — Emergency Department (HOSPITAL_COMMUNITY): Payer: Medicare Other

## 2015-11-24 ENCOUNTER — Inpatient Hospital Stay (HOSPITAL_COMMUNITY)
Admission: EM | Admit: 2015-11-24 | Discharge: 2015-11-26 | DRG: 641 | Disposition: A | Discharge status: Home Health | Payer: Medicare Other | Attending: Internal Medicine | Admitting: Internal Medicine

## 2015-11-24 ENCOUNTER — Encounter (HOSPITAL_COMMUNITY): Payer: Self-pay | Admitting: Emergency Medicine

## 2015-11-24 DIAGNOSIS — A419 Sepsis, unspecified organism: Secondary | ICD-10-CM

## 2015-11-24 DIAGNOSIS — Z79899 Other long term (current) drug therapy: Secondary | ICD-10-CM

## 2015-11-24 DIAGNOSIS — I959 Hypotension, unspecified: Secondary | ICD-10-CM | POA: Diagnosis not present

## 2015-11-24 DIAGNOSIS — E869 Volume depletion, unspecified: Secondary | ICD-10-CM | POA: Diagnosis not present

## 2015-11-24 DIAGNOSIS — Z934 Other artificial openings of gastrointestinal tract status: Secondary | ICD-10-CM

## 2015-11-24 DIAGNOSIS — Z8719 Personal history of other diseases of the digestive system: Secondary | ICD-10-CM

## 2015-11-24 HISTORY — DX: Acute pancreatitis without necrosis or infection, unspecified: K85.90

## 2015-11-24 LAB — COMPREHENSIVE METABOLIC PANEL
ALBUMIN: 3 g/dL — AB (ref 3.5–5.0)
ALK PHOS: 82 U/L (ref 38–126)
ALT: 35 U/L (ref 17–63)
AST: 19 U/L (ref 15–41)
Anion gap: 7 (ref 5–15)
BILIRUBIN TOTAL: 0.8 mg/dL (ref 0.3–1.2)
BUN: 18 mg/dL (ref 6–20)
CALCIUM: 8.9 mg/dL (ref 8.9–10.3)
CO2: 28 mmol/L (ref 22–32)
Chloride: 99 mmol/L — ABNORMAL LOW (ref 101–111)
Creatinine, Ser: 0.69 mg/dL (ref 0.61–1.24)
GFR calc Af Amer: >60 mL/min (ref >60)
GFR calc non Af Amer: >60 mL/min (ref >60)
GLUCOSE: 84 mg/dL (ref 65–99)
Potassium: 4.2 mmol/L (ref 3.5–5.1)
Sodium: 134 mmol/L — ABNORMAL LOW (ref 135–145)
TOTAL PROTEIN: 6.9 g/dL (ref 6.5–8.1)

## 2015-11-24 LAB — CBC WITH DIFFERENTIAL/PLATELET
BASOS PCT: 0 %
Basophils Absolute: 0 10*3/uL (ref 0.0–0.1)
EOS ABS: 0.2 10*3/uL (ref 0.0–0.7)
Eosinophils Relative: 2 %
HEMATOCRIT: 31.7 % — AB (ref 39.0–52.0)
Hemoglobin: 9.8 g/dL — ABNORMAL LOW (ref 13.0–17.0)
Lymphocytes Relative: 15 %
Lymphs Abs: 1.1 10*3/uL (ref 0.7–4.0)
MCH: 28.2 pg (ref 26.0–34.0)
MCHC: 30.9 g/dL (ref 30.0–36.0)
MCV: 91.4 fL (ref 78.0–100.0)
MONO ABS: 1.2 10*3/uL — AB (ref 0.1–1.0)
MONOS PCT: 16 %
Neutro Abs: 5.2 10*3/uL (ref 1.7–7.7)
Neutrophils Relative %: 67 %
Platelets: 223 10*3/uL (ref 150–400)
RBC: 3.47 MIL/uL — ABNORMAL LOW (ref 4.22–5.81)
RDW: 19.2 % — AB (ref 11.5–15.5)
WBC: 7.8 10*3/uL (ref 4.0–10.5)

## 2015-11-24 LAB — URINALYSIS, ROUTINE W REFLEX MICROSCOPIC
BILIRUBIN URINE: NEGATIVE
Glucose, UA: NEGATIVE mg/dL
HGB URINE DIPSTICK: NEGATIVE
Leukocytes, UA: NEGATIVE
Nitrite: NEGATIVE
Protein, ur: NEGATIVE mg/dL
SPECIFIC GRAVITY, URINE: 1.01 (ref 1.005–1.030)
pH: 6.5 (ref 5.0–8.0)

## 2015-11-24 LAB — I-STAT CG4 LACTIC ACID, ED
LACTIC ACID, VENOUS: 2.07 mmol/L — AB (ref 0.5–1.9)
Lactic Acid, Venous: 1.8 mmol/L (ref 0.5–1.9)

## 2015-11-24 LAB — LIPASE, BLOOD: Lipase: 40 U/L (ref 11–51)

## 2015-11-24 LAB — CBG MONITORING, ED: GLUCOSE-CAPILLARY: 73 mg/dL (ref 65–99)

## 2015-11-24 MED ORDER — LOPERAMIDE HCL 2 MG PO CAPS
2.0000 mg | ORAL_CAPSULE | Freq: Four times a day (QID) | ORAL | Status: DC | PRN
  Administered 2015-11-25: 2 mg via ORAL
  Filled 2015-11-24: qty 1

## 2015-11-24 MED ORDER — ONDANSETRON HCL 4 MG/2ML IJ SOLN
4.0000 mg | Freq: Four times a day (QID) | INTRAMUSCULAR | Status: DC | PRN
  Administered 2015-11-25: 4 mg via INTRAVENOUS
  Filled 2015-11-24 (×2): qty 2

## 2015-11-24 MED ORDER — SODIUM CHLORIDE 0.9% FLUSH
3.0000 mL | Freq: Two times a day (BID) | INTRAVENOUS | Status: DC
  Administered 2015-11-25 (×2): 3 mL via INTRAVENOUS

## 2015-11-24 MED ORDER — SODIUM CHLORIDE 0.9 % IV BOLUS (SEPSIS)
1000.0000 mL | Freq: Once | INTRAVENOUS | Status: AC
  Administered 2015-11-24: 1000 mL via INTRAVENOUS

## 2015-11-24 MED ORDER — SODIUM CHLORIDE 0.9 % IV SOLN
INTRAVENOUS | Status: DC
  Administered 2015-11-24 – 2015-11-26 (×4): via INTRAVENOUS

## 2015-11-24 MED ORDER — OXYCODONE HCL 5 MG PO TABS: 5.0000 mg | ORAL_TABLET | ORAL | Status: DC | PRN

## 2015-11-24 MED ORDER — PROMETHAZINE HCL 12.5 MG PO TABS
12.5000 mg | ORAL_TABLET | Freq: Three times a day (TID) | ORAL | Status: DC | PRN
  Administered 2015-11-25: 12.5 mg via ORAL
  Filled 2015-11-24 (×2): qty 1

## 2015-11-24 MED ORDER — SODIUM CHLORIDE 0.9 % IV BOLUS (SEPSIS)
500.0000 mL | Freq: Once | INTRAVENOUS | Status: AC
  Administered 2015-11-24: 500 mL via INTRAVENOUS

## 2015-11-24 MED ORDER — SODIUM CHLORIDE 0.9 % IV BOLUS (SEPSIS)
1000.0000 mL | Freq: Once | INTRAVENOUS | Status: AC
Start: 2015-11-24 — End: 2015-11-24
  Administered 2015-11-24: 1000 mL via INTRAVENOUS

## 2015-11-24 MED ORDER — TRAZODONE HCL 50 MG PO TABS
50.0000 mg | ORAL_TABLET | Freq: Every day | ORAL | Status: DC
  Administered 2015-11-24 – 2015-11-25 (×2): 50 mg via JEJUNOSTOMY
  Filled 2015-11-24 (×2): qty 1

## 2015-11-24 MED ORDER — ENOXAPARIN SODIUM 40 MG/0.4ML ~~LOC~~ SOLN
40.0000 mg | SUBCUTANEOUS | Status: DC
  Filled 2015-11-24 (×2): qty 0.4

## 2015-11-24 MED ORDER — VANCOMYCIN HCL IN DEXTROSE 1-5 GM/200ML-% IV SOLN
1000.0000 mg | Freq: Two times a day (BID) | INTRAVENOUS | Status: DC
  Administered 2015-11-24 – 2015-11-25 (×3): 1000 mg via INTRAVENOUS
  Filled 2015-11-24 (×4): qty 200

## 2015-11-24 MED ORDER — ONDANSETRON HCL 4 MG PO TABS
4.0000 mg | ORAL_TABLET | Freq: Four times a day (QID) | ORAL | Status: DC | PRN
  Administered 2015-11-26: 4 mg via ORAL
  Filled 2015-11-24: qty 1

## 2015-11-24 MED ORDER — PIPERACILLIN-TAZOBACTAM 3.375 G IVPB
3.3750 g | Freq: Three times a day (TID) | INTRAVENOUS | Status: DC
  Administered 2015-11-24 – 2015-11-26 (×5): 3.375 g via INTRAVENOUS
  Filled 2015-11-24 (×5): qty 50

## 2015-11-24 MED ORDER — PIPERACILLIN-TAZOBACTAM 3.375 G IVPB 30 MIN
3.3750 g | Freq: Once | INTRAVENOUS | Status: AC
  Administered 2015-11-24: 3.375 g via INTRAVENOUS
  Filled 2015-11-24: qty 50

## 2015-11-24 MED ORDER — VANCOMYCIN HCL IN DEXTROSE 1-5 GM/200ML-% IV SOLN
1000.0000 mg | Freq: Once | INTRAVENOUS | Status: AC
  Administered 2015-11-24: 1000 mg via INTRAVENOUS
  Filled 2015-11-24: qty 200

## 2015-11-24 MED ORDER — LEVOTHYROXINE SODIUM 75 MCG PO TABS
150.0000 ug | ORAL_TABLET | Freq: Every day | ORAL | Status: DC
  Administered 2015-11-25 – 2015-11-26 (×2): 150 ug via ORAL
  Filled 2015-11-24 (×2): qty 2

## 2015-11-24 MED ORDER — ONDANSETRON HCL 4 MG/2ML IJ SOLN
4.0000 mg | Freq: Once | INTRAMUSCULAR | Status: AC
  Administered 2015-11-24: 4 mg via INTRAVENOUS
  Filled 2015-11-24: qty 2

## 2015-11-24 MED ORDER — PANCRELIPASE (LIP-PROT-AMYL) 12000-38000 UNITS PO CPEP
48000.0000 [IU] | ORAL_CAPSULE | Freq: Three times a day (TID) | ORAL | Status: DC
  Administered 2015-11-25 – 2015-11-26 (×4): 48000 [IU] via ORAL
  Filled 2015-11-24 (×5): qty 4

## 2015-11-24 MED ORDER — PANTOPRAZOLE SODIUM 40 MG PO TBEC
40.0000 mg | DELAYED_RELEASE_TABLET | Freq: Every day | ORAL | Status: DC
  Administered 2015-11-25 (×2): 40 mg via ORAL
  Filled 2015-11-24 (×2): qty 1

## 2015-11-24 NOTE — ED Triage Notes (Signed)
Per daughter concerned patient may be dehydrated due to skin turgor and elevated heart rate. Daughter states that patient gets dehydrated. Per daughter patient doesn't drink much and has j-tube and peg tube. Patient also has nausea and vomiting associated with pancreatitis.

## 2015-11-24 NOTE — Progress Notes (Signed)
Pharmacy Antibiotic Note  Andrew Watson is a 75 y.o. male admitted on 11/24/2015 with sepsis.  Pharmacy has been consulted for [name] dosing.  1st doses given in ED.  Since pt not fully loaded with Vanc will given 2nd dose early.    Plan:  Vancomycin 1000mg  IV q12h Check trough at steady state Zosyn 3.375gm IV q8h, EID Monitor labs, renal fxn, progress and c/s Deescalate ABX when improved / appropriate.    Height: 5\' 10"  (177.8 cm) Weight: 180 lb (81.6 kg) IBW/kg (Calculated) : 73  Temp (24hrs), Avg:97.6 F (36.4 C), Min:97.6 F (36.4 C), Max:97.6 F (36.4 C)   Recent Labs Lab 11/24/15 1402 11/24/15 1519  WBC 7.8  --   CREATININE 0.69  --   LATICACIDVEN  --  2.07*    Estimated Creatinine Clearance: 81.1 mL/min (by C-G formula based on SCr of 0.69 mg/dL).    No Known Allergies  Antimicrobials this admission: Vanc 10/29 >>  Zosyn 10/29 >>   Dose adjustments this admission:  Microbiology results: (11/24/15)  BCx: pending  UCx: pending   Sputum:    MRSA PCR:   Thank you for allowing pharmacy to be a part of this patient's care.  Andrew Watson A 11/24/2015 4:33 PM

## 2015-11-24 NOTE — Progress Notes (Signed)
Patient and daughter both upset about time taken to get IV fluids and tube feedings and medicines. Felt that they were not receiving adequate care and became quite upset and belligerent with staff. This RN and others tried to explain the process of doctor orders being written acknowledged and verified before anything could be given and also that the patient was not a 1:1 RN to patient, and as matter of fact the physician had actually written an order to move the patient to telemetry, as he was not as sick as initially thought to be. Much service recovery was attempted but mostly fell on deaf ears. After transfer patient and daughter seemed to be more understanding and apologetic about the situation but seems to this RN that they did not understand why the patient was not being cared for in the manner they thought was appropriate. Attempts to explain acuity and level of care and priority of nursing care to them did nothing but upset them more. Again, patient and RN both apologetic towards each other and state that everything is ok. [facility] supervisor was aware of situation but did not intervene as he felt he could do nothing to improve situation.

## 2015-11-24 NOTE — ED Notes (Signed)
Report given to [place], Therapist, sports. Pt transferred to ICU/SDU room 9, care handed out

## 2015-11-24 NOTE — H&P (Signed)
[facility] History and Physical  Andrew Watson [id number] DOB: 1940/12/09 DOA: 11/24/2015  Referring physician: Dr Rogene Houston PCP: Wende Neighbors, MD   Chief Complaint: Hypotension  HPI: Andrew Watson is a 75 y.o. male with history of gout, DJD and recently complicated pancreatitis, who presents from home with low BP and concerns of dehydration by family.  His skin looked like "dehydration" and they called the HHN to check his BP which was low.  Normal BP for pt is about [id number] systolic.    Patient was admitted here at [facility] in May 2017 with abd pain and acute pancreatitis, pt reported drinking about 4-5 oz of liquor per day.  CT showed acute pancreatitis w/o necrosis. CT showed no biliary obstruction.  He was kept NPO but pain/ nauseas didn't improve so he had feeding tube and PICC placed for TPN.  Looks like he was sent to [facility] then and had complications including pseudocyst , and gastric outlet obstruction from local duodenitis related to surrounding panc inflammation.  Abd drains were placed by IR and GI placed a biliary stent in the CBD and a gastrojejunostomy stent to relieve GOO.  DC"d on 08/09/15.  Readmitted within days with cholangitis, dislodged biliary stent.  Rx with [facility] abx at [facility].  Had acute PE.  Had G/JK tubes placed sometime during these admissions.     His regimen at home now is nine (9) 8 oz boxes of supplements per J-tube over 16 hrs every day.  They clamp the G-tube unless he has N/V.  He gets 150 cc tap H2O flush of the J tube every 4 hrs.  Patient says he throws up / vomits "every day", his family disagress and says its no tthat much. Patinet says he feels "fine".     Patient denies any recent F/C/S, new abd pain, cough/ CP, dysuria.  In ED BP 80's.  Afeb, normal HR.  Labs show normal CBC and BMET, normal LFT"s and lipase.     ROS  denies CP  no joint pain   no HA  no blurry vision  no rash  no diarrhea  no nausea/ vomiting  no dysuria  no difficulty  voiding  no change in urine color    Past Medical History  Past Medical History:  Diagnosis Date  . Acute pancreatitis   . Arthritis   . Gout   . Thyroid disease    Past Surgical History  Past Surgical History:  Procedure Laterality Date  . BACK SURGERY    . COLONOSCOPY  07/08/2011   Procedure: COLONOSCOPY;  Surgeon: Rogene Houston, MD;  [facility];  Service: Endoscopy;  Laterality: N/A;  930  . JEJUNOSTOMY FEEDING TUBE    . KNEE ARTHROSCOPY    . LEFT HEART CATHETERIZATION WITH CORONARY ANGIOGRAM N/A 05/07/2011   Procedure: LEFT HEART CATHETERIZATION WITH CORONARY ANGIOGRAM;  Surgeon: Peter M Martinique, MD;  Location: [facility] CATH LAB;  Service: Cardiovascular;  Laterality: N/A;  . PEG TUBE PLACEMENT     Family History History reviewed. No pertinent family history. Social History  reports that he has never smoked. He has never used smokeless tobacco. He reports that he does not drink alcohol or use drugs. Allergies No Known Allergies Home medications Prior to Admission medications   Medication Sig Start Date End Date Taking? Authorizing Provider  levothyroxine (SYNTHROID, LEVOTHROID) 150 MCG tablet Take 150 mcg by mouth daily.  11/07/15 12/07/15 Yes Historical Provider, MD  loperamide (IMODIUM) 2 MG capsule Take 2  mg by mouth 4 (four) times daily as needed for diarrhea or loose stools.  11/07/15 12/07/15 Yes Historical Provider, MD  ondansetron (ZOFRAN) 4 MG tablet Take 4 mg by mouth every 8 (eight) hours as needed. 11/13/15  Yes Historical Provider, MD  Pancrelipase, Lip-Prot-Amyl, 24000-76000 units CPEP Take 24,000-48,000 Units by mouth 3 (three) times daily.  11/08/15  Yes Historical Provider, MD  pantoprazole (PROTONIX) 40 MG tablet Take 1 tablet (40 mg total) by mouth daily. 07/12/15 07/11/16 Yes Kathie Dike, MD  traZODone (DESYREL) 50 MG tablet 50 mg by Per J Tube route at bedtime. 11/07/15 12/07/15 Yes Historical Provider, MD  promethazine (PHENERGAN) 12.5 MG tablet  Take 12.5 mg by mouth every 8 (eight) hours as needed for nausea or vomiting.    Historical Provider, MD   Liver Function Tests  Recent Labs Lab 11/24/15 1402  AST 19  ALT 35  ALKPHOS 82  BILITOT 0.8  PROT 6.9  ALBUMIN 3.0*    Recent Labs Lab 11/24/15 1402  LIPASE 40   CBC  Recent Labs Lab 11/24/15 1402  WBC 7.8  NEUTROABS 5.2  HGB 9.8*  HCT 31.7*  MCV 91.4  PLT [id number]   Basic Metabolic Panel  Recent Labs Lab 11/24/15 1402  NA 134*  K 4.2  CL 99*  CO2 28  GLUCOSE 84  BUN 18  CREATININE 0.69  CALCIUM 8.9     Vitals:   11/24/15 1500 11/24/15 1515 11/24/15 1530 11/24/15 1545  BP:   (!) 88/49   Pulse: 85 80 80 78  Resp: 15 20 18 23   Temp:      TempSrc:      SpO2: 100% 100% 100% 99%  Weight:      Height:       Exam: Gen alert, no distress No rash, cyanosis or gangrene Sclera anicteric, throat clear and slightly dry  No jvd or bruits Chest clear bilat RRR no MRG Abd soft ntnd no mass or ascites +bs LUQ G-tube and J-tubes in place, Dressing over a healing wound in the RLQ (site of prior intraabd drain) GU normal male MS no joint effusions or deformity Ext no LE edema / no wounds or ulcers Neuro is alert, Ox 3 , nf   Cr 0.69  BNa 134    WBC 7k  Hb 9.8   UA negative  CXR (independ reviewed) > no acute disease   Assessment: 1. Hypotension - in patient w complicated pancreatitis since May 2017.  Suspect vol depletion.  He is asymptomatic and not toxic , normal wbc/ temp.  Plan IVF's, empiric abx IV started in ED. Will continue for now pending cx's.  Blood and urine cx's sent.  No IV's or PICC lines in.   2. Complicated pancreatitis - see above. Followed by [facility]. Continue TF's and panc enzymes.    3. Nutrition - J/G tubes in place, getting scheduled JT feeds 9 cans per day   Plan - as above, give lovenox SQ for DVT proph.      Roney Jaffe D [facility] Pager [phone]   If 7PM-7AM, please contact  night-coverage [email or web address] Password [facility] 11/24/2015, 4:33 PM

## 2015-11-24 NOTE — ED Provider Notes (Addendum)
Andrew Watson DEPT Provider Note   CSN: [id number] Arrival date & time: 11/24/15  1301     History   Chief Complaint Chief Complaint  Patient presents with  . Dehydration    HPI Andrew Watson is a 75 y.o. male.  Patient referred in after visit with home nurse for concerns for dehydration. Patient stated his mouth felt dry. Apparently blood pressure was normal at home but a little lower than his usual. Patient arrived here with a blood pressure 109/71 with normal room air sats. Patient denies any fevers any pain anywhere. Patient admits to some fatigue but denies any of feeling sick. Patient status post complicated course of pancreatitis due to biliary obstruction Gaspar Cola was there for 12 weeks. Patient receives all his feedings through tube feeding.      Past Medical History:  Diagnosis Date  . Acute pancreatitis   . Arthritis   . Gout   . Thyroid disease     Patient Active Problem List   Diagnosis Date Noted  . Hypotension 11/24/2015  . Pancreatitis 08/12/2015  . Cholangitis 08/12/2015  . Pulmonary emboli ([place]) 08/12/2015  . Duodenal stricture 08/10/2015  . Gastric outlet obstruction 08/10/2015  . H/O bypass gastrojejunostomy 08/10/2015  . Pancreatic pseudocyst 08/01/2015  . Alcohol use 07/03/2015  . Atrial fibrillation ([place]) 07/03/2015  . Acute pancreatitis 06/26/2015  . Insomnia 05/15/2011  . Pericarditis 05/09/2011  . Hypothyroidism 05/08/2011    Past Surgical History:  Procedure Laterality Date  . BACK SURGERY    . COLONOSCOPY  07/08/2011   Procedure: COLONOSCOPY;  Surgeon: Rogene Houston, MD;  [facility];  Service: Endoscopy;  Laterality: N/A;  930  . JEJUNOSTOMY FEEDING TUBE    . KNEE ARTHROSCOPY    . LEFT HEART CATHETERIZATION WITH CORONARY ANGIOGRAM N/A 05/07/2011   Procedure: LEFT HEART CATHETERIZATION WITH CORONARY ANGIOGRAM;  Surgeon: Peter M Martinique, MD;  Location: [facility] CATH LAB;  Service: Cardiovascular;  Laterality: N/A;  .  PEG TUBE PLACEMENT         Home Medications    Prior to Admission medications   Medication Sig Start Date End Date Taking? Authorizing Provider  levothyroxine (SYNTHROID, LEVOTHROID) 150 MCG tablet Take 150 mcg by mouth daily.  11/07/15 12/07/15 Yes Historical Provider, MD  loperamide (IMODIUM) 2 MG capsule Take 2 mg by mouth 4 (four) times daily as needed for diarrhea or loose stools.  11/07/15 12/07/15 Yes Historical Provider, MD  ondansetron (ZOFRAN) 4 MG tablet Take 4 mg by mouth every 8 (eight) hours as needed. 11/13/15  Yes Historical Provider, MD  Pancrelipase, Lip-Prot-Amyl, 24000-76000 units CPEP Take 24,000-48,000 Units by mouth 3 (three) times daily.  11/08/15  Yes Historical Provider, MD  pantoprazole (PROTONIX) 40 MG tablet Take 1 tablet (40 mg total) by mouth daily. 07/12/15 07/11/16 Yes Kathie Dike, MD  traZODone (DESYREL) 50 MG tablet 50 mg by Per J Tube route at bedtime. 11/07/15 12/07/15 Yes Historical Provider, MD  promethazine (PHENERGAN) 12.5 MG tablet Take 12.5 mg by mouth every 8 (eight) hours as needed for nausea or vomiting.    Historical Provider, MD    Family History History reviewed. No pertinent family history.  Social History Social History  Substance Use Topics  . Smoking status: Never Smoker  . Smokeless tobacco: Never Used  . Alcohol use No     Comment: none since May 31st     Allergies   Review of patient's allergies indicates no known allergies.   Review of Systems  Review of Systems  Constitutional: Positive for fatigue. Negative for fever.  HENT: Negative for congestion.   Eyes: Negative for visual disturbance.  Respiratory: Negative for shortness of breath.   Cardiovascular: Negative for chest pain.  Gastrointestinal: Negative for abdominal pain.  Genitourinary: Negative for dysuria.  Skin: Negative for rash.  Neurological: Negative for headaches.  Hematological: Does not bruise/bleed easily.  Psychiatric/Behavioral: Negative for  confusion.     Physical Exam Updated Vital Signs BP (!) 88/49   Pulse 78   Temp 97.6 F (36.4 C) (Oral)   Resp 23   Ht 5\' 10"  (1.778 m)   Wt 81.6 kg   SpO2 99%   BMI 25.83 kg/m   Physical Exam  Constitutional: He is oriented to person, place, and time. He appears well-developed and well-nourished. No distress.  HENT:  Head: Normocephalic and atraumatic.  Mucous membranes dry  Eyes: Conjunctivae and EOM are normal. Pupils are equal, round, and reactive to light.  Neck: Normal range of motion. Neck supple.  Cardiovascular: Normal rate, regular rhythm and normal heart sounds.   Pulmonary/Chest: Effort normal and breath sounds normal. No respiratory distress.  Abdominal: Soft. Bowel sounds are normal. He exhibits no distension. There is no tenderness.  G-tube and feeding tube in place  Musculoskeletal: Normal range of motion.  Neurological: He is alert and oriented to person, place, and time. No cranial nerve deficit. He exhibits normal muscle tone. Coordination normal.  Skin: Skin is warm.  Nursing note and vitals reviewed.    ED Treatments / Results  Labs (all labs ordered are listed, but only abnormal results are displayed) Labs Reviewed  COMPREHENSIVE METABOLIC PANEL - Abnormal; Notable for the following:       Result Value   Sodium 134 (*)    Chloride 99 (*)    Albumin 3.0 (*)    All other components within normal limits  CBC WITH DIFFERENTIAL/PLATELET - Abnormal; Notable for the following:    RBC 3.47 (*)    Hemoglobin 9.8 (*)    HCT 31.7 (*)    RDW 19.2 (*)    Monocytes Absolute 1.2 (*)    All other components within normal limits  URINALYSIS, ROUTINE W REFLEX MICROSCOPIC (NOT AT [facility]) - Abnormal; Notable for the following:    Ketones, ur TRACE (*)    All other components within normal limits  I-STAT CG4 LACTIC ACID, ED - Abnormal; Notable for the following:    Lactic Acid, Venous 2.07 (*)    All other components within normal limits  CULTURE, BLOOD  (ROUTINE X 2)  CULTURE, BLOOD (ROUTINE X 2)  URINE CULTURE  LIPASE, BLOOD  CBG MONITORING, ED  I-STAT CG4 LACTIC ACID, ED    EKG  EKG Interpretation  Date/Time:  Sunday November 24 2015 13:18:36 EDT Ventricular Rate:  92 PR Interval:    QRS Duration: 73 QT Interval:  356 QTC Calculation: 441 R Axis:   4 Text Interpretation:  Sinus rhythm Low voltage, precordial leads Baseline wander in lead(s) II III aVR aVF Interpretation limited secondary to artifact Confirmed by Koraima Albertsen  MD, [place] [phone]) on 11/24/2015 3:52:46 PM       Radiology Dg Chest Port 1 View  Result Date: 11/24/2015 CLINICAL DATA:  Hypotension, history of pancreatitis EXAM: PORTABLE CHEST 1 VIEW COMPARISON:  07/07/2015 FINDINGS: Mild blunting of the left costophrenic angle. No definite pleural effusion. No focal consolidation. No frank interstitial edema. No pneumothorax. The heart is normal in size. IMPRESSION: No evidence of acute cardiopulmonary  disease. Electronically Signed   By: Julian Hy M.D.   On: 11/24/2015 15:02    Procedures Procedures (including critical care time) CRITICAL CARE Performed by: Fredia Sorrow Total critical care time: 30 minutes Critical care time was exclusive of separately billable procedures and treating other patients. Critical care was necessary to treat or prevent imminent or life-threatening deterioration. Critical care was time spent personally by me on the following activities: development of treatment plan with patient and/or surrogate as well as nursing, discussions with consultants, evaluation of patient's response to treatment, examination of patient, obtaining history from patient or surrogate, ordering and performing treatments and interventions, ordering and review of laboratory studies, ordering and review of radiographic studies, pulse oximetry and re-evaluation of patient's condition.   Medications Ordered in ED Medications  0.9 %  sodium chloride infusion (  Intravenous New Bag/Given 11/24/15 1453)  sodium chloride 0.9 % bolus 1,000 mL (1,000 mLs Intravenous New Bag/Given 11/24/15 1602)    And  sodium chloride 0.9 % bolus 1,000 mL (not administered)    And  sodium chloride 0.9 % bolus 500 mL (not administered)  vancomycin (VANCOCIN) IVPB 1000 mg/200 mL premix (not administered)  piperacillin-tazobactam (ZOSYN) IVPB 3.375 g (not administered)  vancomycin (VANCOCIN) IVPB 1000 mg/200 mL premix (not administered)  sodium chloride 0.9 % bolus 1,000 mL (0 mLs Intravenous Stopped 11/24/15 1456)  ondansetron (ZOFRAN) injection 4 mg (4 mg Intravenous Given 11/24/15 1416)  sodium chloride 0.9 % bolus 500 mL (500 mLs Intravenous Bolus from Bag 11/24/15 1453)  piperacillin-tazobactam (ZOSYN) IVPB 3.375 g (3.375 g Intravenous New Bag/Given 11/24/15 1604)     Initial Impression / Assessment and Plan / ED Course  I have reviewed the triage vital signs and the nursing notes.  Pertinent labs & imaging results that were available during my care of the patient were reviewed by me and considered in my medical decision making (see chart for details).  Clinical Course    Patient discharged in September from [facility] following an extensive and complicated course of pancreatitis. Due to biliary obstruction. Patient receives tube feedings. Patient sent in today for concerns about being dehydrated. Mouth was dry but patient states dry all the time. Workup here does not seem to be consistent with dehydration. Initially came in with normal blood pressures. However than blood pressures dropped down to low 80s no real change in heart rate. Lactic acid slightly elevated above 2. Sepsis protocol started due to the hypotension. Blood pressure still now in the upper 80s patient is receiving fluid challenge. Patient clinically is nontoxic feels fine doesn't have any complaints. [facility] has a lactic acid recorded on August 29 last one recorded there 2.1. Patient's LFTs and  lipase are all normal. There is no leukocytosis there is no fevers. There is no significant anemia. Urinalysis is negative for urinary tract infection. Blood cultures were done urine culture was sent.  Based on hypotension fluid challenges at 2-1/2 L of fluid. Patient started on broad-spectrum antibiotics. Code sepsis called.   Patient will be admitted to telemetry observation admission for monitoring of the blood pressure and rechecks of the lactic acid.  Final Clinical Impressions(s) / ED Diagnoses   Final diagnoses:  Sepsis, due to unspecified organism ([place])  Hypotension, unspecified hypotension type    New Prescriptions New Prescriptions   No medications on file     Fredia Sorrow, MD 11/24/15 1656    Fredia Sorrow, MD 11/24/15 1657

## 2015-11-25 DIAGNOSIS — E869 Volume depletion, unspecified: Principal | ICD-10-CM

## 2015-11-25 DIAGNOSIS — Z934 Other artificial openings of gastrointestinal tract status: Secondary | ICD-10-CM

## 2015-11-25 DIAGNOSIS — Z79899 Other long term (current) drug therapy: Secondary | ICD-10-CM | POA: Diagnosis not present

## 2015-11-25 DIAGNOSIS — Z8719 Personal history of other diseases of the digestive system: Secondary | ICD-10-CM

## 2015-11-25 DIAGNOSIS — I959 Hypotension, unspecified: Secondary | ICD-10-CM | POA: Diagnosis present

## 2015-11-25 LAB — CBC
HEMATOCRIT: 26 % — AB (ref 39.0–52.0)
HEMOGLOBIN: 8.1 g/dL — AB (ref 13.0–17.0)
MCH: 28.8 pg (ref 26.0–34.0)
MCHC: 31.2 g/dL (ref 30.0–36.0)
MCV: 92.5 fL (ref 78.0–100.0)
Platelets: 171 10*3/uL (ref 150–400)
RBC: 2.81 MIL/uL — AB (ref 4.22–5.81)
RDW: 19.6 % — ABNORMAL HIGH (ref 11.5–15.5)
WBC: 5.7 10*3/uL (ref 4.0–10.5)

## 2015-11-25 LAB — BASIC METABOLIC PANEL
ANION GAP: 4 — AB (ref 5–15)
BUN: 14 mg/dL (ref 6–20)
CALCIUM: 8 mg/dL — AB (ref 8.9–10.3)
CO2: 23 mmol/L (ref 22–32)
Chloride: 107 mmol/L (ref 101–111)
Creatinine, Ser: 0.76 mg/dL (ref 0.61–1.24)
GLUCOSE: 121 mg/dL — AB (ref 65–99)
POTASSIUM: 4 mmol/L (ref 3.5–5.1)
SODIUM: 134 mmol/L — AB (ref 135–145)

## 2015-11-25 LAB — MRSA PCR SCREENING: MRSA BY PCR: NEGATIVE

## 2015-11-25 MED ORDER — PROMETHAZINE HCL 25 MG/ML IJ SOLN
12.5000 mg | Freq: Four times a day (QID) | INTRAMUSCULAR | Status: DC | PRN
Start: 2015-11-25 — End: 2015-11-26
  Administered 2015-11-25: 12.5 mg via INTRAVENOUS
  Filled 2015-11-25: qty 1

## 2015-11-25 NOTE — Care Management Note (Signed)
Case Management Note  Patient Details  Name: Andrew Watson MRN: [id number] Date of Birth: Sep 01, 1940  Subjective/Objective:   Patient adm from home with hypotension. He gets medical care in [place]. Patient sleeping, daughter did not want me to wake him. Per daughter patient has home health with [facility] for nursing. They pay someone to come in a few hours a day to sit with patient. Patient has a G tube and J tube per daughter. She states that family and friend help to manage tubes and patient care.             Action/Plan: Anticipate DC home with resumption of [facility] services. No other CM needs.    Expected Discharge Date:  11/26/15               Expected Discharge Plan:  [place]  In-House Referral:  NA  Discharge planning Services  CM Consult  Post Acute Care Choice:  Home Health, Resumption of Svcs/PTA Provider Choice offered to:  Adult Children  DME Arranged:    DME Agency:     HH Arranged:    HH Agency:     Status of Service:  Completed, signed off  If discussed at [place] of Stay Meetings, dates discussed:    Additional Comments:  Suhaas Agena, Chauncey [place], RN 11/25/2015, 12:24 PM

## 2015-11-25 NOTE — Care Management Obs Status (Signed)
[facility] NOTIFICATION   Patient Details  Name: Andrew Watson MRN: [id number] Date of Birth: 02-19-1940   Medicare Observation Status Notification Given:  Yes    Kasem Mozer, Chauncey [place], RN 11/25/2015, 12:29 PM

## 2015-11-25 NOTE — Progress Notes (Signed)
PROGRESS NOTE    Andrew Watson  [id number] DOB: 11-Jul-1940 DOA: 11/24/2015 PCP: Wende Neighbors, MD   Brief Narrative: patient with hx of complicated pancreatitis, from home, with hx of significant alcohol use, pancreatic pseudocyst, GOO, with cholangitis, dislodged biliary stent, acute PE, admitted for hypotension, suspicious for dehydration, and to r/out sepsis.  He is on IV Van/Zosyn, and had BC done.   Assessment & Plan:   Active Problems:   Hypotension   History of pancreatitis   Jejunostomy tube in situ (HCC)   Volume depletion   Hypotension:  Continue with IVF.  Resume TF, and wait cultures.  Continue with IV Antibiotics for now.  He wants to go home, but I think it is too soon, as he could have sepsis.  Spoke with daughter and patient, will continue with current Tx for now, and if BC remaines negative, will d/c him home likely Wednesday.    DVT prophylaxis: Lovenox.  Code Status: FULL CODE.  Family Communication:Daughter.  Consultants:  None   Procedures:   None     Antimicrobials: IV Van/Zosy.    Subjective:   I feel fine.    Objective: Vitals:   11/25/15 0100 11/25/15 0500 11/25/15 0647 11/25/15 0945  BP: 95/61  (!) 95/55 102/64  Pulse: 89  82 99  Resp:   16 18  Temp:   98.4 F (36.9 C) 97.9 F (36.6 C)  TempSrc:   Oral Oral  SpO2:   98% 99%  Weight:  84.8 kg (186 lb 15.2 oz)    Height:        Intake/Output Summary (Last 24 hours) at 11/25/15 1106 Last data filed at 11/25/15 0646  Gross per 24 hour  Intake             2300 ml  Output             2000 ml  Net              300 ml   Filed Weights   11/24/15 1317 11/24/15 2001 11/25/15 0500  Weight: 81.6 kg (180 lb) 85.4 kg (188 lb 4.4 oz) 84.8 kg (186 lb 15.2 oz)    Examination:  General exam: Appears calm and comfortable  Respiratory system: Clear to auscultation. Respiratory effort normal. Cardiovascular system: S1 & S2 heard, RRR. No JVD, murmurs, rubs, gallops or clicks. No pedal  edema. Gastrointestinal system: Abdomen is nondistended, soft and nontender. No organomegaly or masses felt. Normal bowel sounds heard. Central nervous system: Alert and oriented. No focal neurological deficits. Extremities: Symmetric 5 x 5 power. Skin: No rashes, lesions or ulcers Psychiatry: Judgement and insight appear normal. Mood & affect appropriate.     Data Reviewed:   CBC:  Recent Labs Lab 11/24/15 1402 11/25/15 0500  WBC 7.8 5.7  NEUTROABS 5.2  --   HGB 9.8* 8.1*  HCT 31.7* 26.0*  MCV 91.4 92.5  PLT 223 [id number]   Basic Metabolic Panel:  Recent Labs Lab 11/24/15 1402 11/25/15 0500  NA 134* 134*  K 4.2 4.0  CL 99* 107  CO2 28 23  GLUCOSE 84 121*  BUN 18 14  CREATININE 0.69 0.76  CALCIUM 8.9 8.0*   GFR: Estimated Creatinine Clearance: 81.1 mL/min (by C-G formula based on SCr of 0.76 mg/dL). Liver Function Tests:  Recent Labs Lab 11/24/15 1402  AST 19  ALT 35  ALKPHOS 82  BILITOT 0.8  PROT 6.9  ALBUMIN 3.0*    Recent Labs Lab 11/24/15 1402  LIPASE 40   CBG:  Recent Labs Lab 11/24/15 1508  GLUCAP 73   Lipid Profile: Sepsis Labs:  Recent Labs Lab 11/24/15 1519 11/24/15 1805  LATICACIDVEN 2.07* 1.80    Recent Results (from the past 240 hour(s))  Blood Culture (routine x 2)     Status: None (Preliminary result)   Collection Time: 11/24/15  3:56 PM  Result Value Ref Range Status   Specimen Description BLOOD RIGHT ANTECUBITAL DRAWN BY RN  Final   Special Requests   Final    BOTTLES DRAWN AEROBIC AND ANAEROBIC AEB=6CC ANA=4CC   Culture NO GROWTH < 24 HOURS  Final   Report Status PENDING  Incomplete  Blood Culture (routine x 2)     Status: None (Preliminary result)   Collection Time: 11/24/15  4:01 PM  Result Value Ref Range Status   Specimen Description BLOOD LEFT HAND  Final   Special Requests BOTTLES DRAWN AEROBIC AND ANAEROBIC 6CC  Final   Culture NO GROWTH < 24 HOURS  Final   Report Status PENDING  Incomplete  MRSA PCR  Screening     Status: None   Collection Time: 11/24/15  7:48 PM  Result Value Ref Range Status   MRSA by PCR NEGATIVE NEGATIVE Final    Comment:        The GeneXpert MRSA Assay (FDA approved for NASAL specimens only), is one component of a comprehensive MRSA colonization surveillance program. It is not intended to diagnose MRSA infection nor to guide or monitor treatment for MRSA infections.          Radiology Studies: Dg Chest Port 1 View  Result Date: 11/24/2015 CLINICAL DATA:  Hypotension, history of pancreatitis EXAM: PORTABLE CHEST 1 VIEW COMPARISON:  07/07/2015 FINDINGS: Mild blunting of the left costophrenic angle. No definite pleural effusion. No focal consolidation. No frank interstitial edema. No pneumothorax. The heart is normal in size. IMPRESSION: No evidence of acute cardiopulmonary disease. Electronically Signed   By: Julian Hy M.D.   On: 11/24/2015 15:02   Scheduled Meds: . enoxaparin (LOVENOX) injection  40 mg Subcutaneous Q24H  . levothyroxine  150 mcg Oral QAC breakfast  . lipase/protease/amylase  48,000 Units Oral TID AC  . pantoprazole  40 mg Oral Daily  . piperacillin-tazobactam (ZOSYN)  IV  3.375 g Intravenous Q8H  . sodium chloride flush  3 mL Intravenous Q12H  . traZODone  50 mg Per J Tube QHS  . vancomycin  1,000 mg Intravenous Q12H   Continuous Infusions: . sodium chloride 75 mL/hr at 11/24/15 2245     LOS: 0 days  Time spent: 50   Andrew Borquez, MD FACP. [facility]   If 7PM-7AM, please contact night-coverage [email or web address] Password [email or web address] 11/25/2015, 11:06 AM

## 2015-11-26 LAB — COMPREHENSIVE METABOLIC PANEL
ALK PHOS: 61 U/L (ref 38–126)
ALT: 27 U/L (ref 17–63)
AST: 22 U/L (ref 15–41)
Albumin: 2.4 g/dL — ABNORMAL LOW (ref 3.5–5.0)
Anion gap: 5 (ref 5–15)
BUN: 12 mg/dL (ref 6–20)
CALCIUM: 8.5 mg/dL — AB (ref 8.9–10.3)
CO2: 25 mmol/L (ref 22–32)
CREATININE: 0.68 mg/dL (ref 0.61–1.24)
Chloride: 105 mmol/L (ref 101–111)
Glucose, Bld: 88 mg/dL (ref 65–99)
Potassium: 4 mmol/L (ref 3.5–5.1)
Sodium: 135 mmol/L (ref 135–145)
TOTAL PROTEIN: 5.7 g/dL — AB (ref 6.5–8.1)
Total Bilirubin: 0.8 mg/dL (ref 0.3–1.2)

## 2015-11-26 LAB — CBC
HCT: 28.1 % — ABNORMAL LOW (ref 39.0–52.0)
Hemoglobin: 8.7 g/dL — ABNORMAL LOW (ref 13.0–17.0)
MCH: 28.6 pg (ref 26.0–34.0)
MCHC: 31 g/dL (ref 30.0–36.0)
MCV: 92.4 fL (ref 78.0–100.0)
PLATELETS: 153 10*3/uL (ref 150–400)
RBC: 3.04 MIL/uL — AB (ref 4.22–5.81)
RDW: 19.6 % — ABNORMAL HIGH (ref 11.5–15.5)
WBC: 4.9 10*3/uL (ref 4.0–10.5)

## 2015-11-26 LAB — URINE CULTURE: Culture: NO GROWTH

## 2015-11-26 NOTE — Discharge Summary (Signed)
Physician Discharge Summary  [name] [id number] DOB: 11-19-1940 DOA: 11/24/2015  PCP: Wende Neighbors, MD  Admit date: 11/24/2015 Discharge date: 11/26/2015  Admitted From: Home.  Disposition:  Home.   Recommendations for Outpatient Follow-up:  1. Follow up with your doctor at [facility].   Home Health: None.  Equipment/Devices: None.  Discharge Condition:Improved.  CODE STATUS: FULL CODE.  Diet recommendation: As previously.   Brief/Interim Summary:  Patient was admitted into the hospital for hypotension felt to be due to volume depletion, r/out sepsis by Dr Melvia Heaps on Nov 14, 2015.  As per his H and P: " Andrew Watson is a 75 y.o. male with history of gout, DJD and recently complicated pancreatitis, who presents from home with low BP and concerns of dehydration by family.  His skin looked like "dehydration" and they called the [facility]N to check his BP which was low.  Normal BP for pt is about [id number] systolic.    Patient was admitted here at [facility] in May 2017 with abd pain and acute pancreatitis, pt reported drinking about 4-5 oz of liquor per day.  CT showed acute pancreatitis w/o necrosis. CT showed no biliary obstruction.  He was kept NPO but pain/ nauseas didn't improve so he had feeding tube and PICC placed for TPN.  Looks like he was sent to [facility] then and had complications including pseudocyst , and gastric outlet obstruction from local duodenitis related to surrounding panc inflammation.  Abd drains were placed by IR and GI placed a biliary stent in the CBD and a gastrojejunostomy stent to relieve GOO.  DC"d on 08/09/15.  Readmitted within days with cholangitis, dislodged biliary stent.  Rx with IV abx at [facility].  Had acute PE.  Had G/JK tubes placed sometime during these admissions.     His regimen at home now is nine (9) 8 oz boxes of supplements per J-tube over 16 hrs every day.  They clamp the G-tube unless he has N/V.  He gets 150 cc tap H2O flush of the J tube every 4 hrs.  Patient  says he throws up / vomits "every day", his family disagress and says its no tthat much. Patinet says he feels "fine".     Patient denies any recent F/C/S, new abd pain, cough/ CP, dysuria.  In ED BP 80's.  Afeb, normal HR.  Labs show normal CBC and BMET, normal LFT"s and lipase.     Hospital Course:  Patient was admitted into the telemetry and was given gentle IVF.  Blood cultures were obtained, and he was started empircally on IV Van/Zosyn.  His BP came up, and it was felt that he was volume depleted, rather having sepsis.  He had normal WBC, no fever, but lactic acid was just a little over 2.   The following day, he felt well and wanted to go home, however, he was recommneded to stay another day, given a chance to check his blood culture.  Today, he BP has been stable, and nothing grew out of his blood culture.   He has an appointment with his gastroenterologist at [facility] where he is receiving his care, and would like to be discharged.  Though his blood cultures were not completely finalized, it has been NGTD, and so it is reasonable to discharge him today.  He and his GF is aware that if his BC grew something, he will be contacted.  He will be discharged on his home medication, and will follow up with his doctors at  [facility].  Thank you for allowing me to participate in his care.  Good Day.   Discharge Diagnoses:  Active Problems:   Hypotension   History of pancreatitis   Jejunostomy tube in situ (Amo)   Volume depletion    Discharge Instructions  Discharge Instructions    Diet - low sodium heart healthy    Complete by:  As directed    Discharge instructions    Complete by:  As directed    Follow up with your physician at [facility] tomorrow.   Increase activity slowly    Complete by:  As directed        Medication List    STOP taking these medications   promethazine 12.5 MG tablet Commonly known as:  PHENERGAN     TAKE these medications   levothyroxine 150 MCG tablet Commonly known as:   SYNTHROID, LEVOTHROID Take 150 mcg by mouth daily.   loperamide 2 MG capsule Commonly known as:  IMODIUM Take 2 mg by mouth 4 (four) times daily as needed for diarrhea or loose stools.   ondansetron 4 MG tablet Commonly known as:  ZOFRAN Take 4 mg by mouth every 8 (eight) hours as needed.   Pancrelipase (Lip-Prot-Amyl) 24000-76000 units Cpep Take 24,000-48,000 Units by mouth 3 (three) times daily.   pantoprazole 40 MG tablet Commonly known as:  PROTONIX Take 1 tablet (40 mg total) by mouth daily.   traZODone 50 MG tablet Commonly known as:  DESYREL 50 mg by Per J Tube route at bedtime.       No Known Allergies  Consultations:  None.    Procedures/Studies: Dg Chest Port 1 View  Result Date: 11/24/2015 CLINICAL DATA:  Hypotension, history of pancreatitis EXAM: PORTABLE CHEST 1 VIEW COMPARISON:  07/07/2015 FINDINGS: Mild blunting of the left costophrenic angle. No definite pleural effusion. No focal consolidation. No frank interstitial edema. No pneumothorax. The heart is normal in size. IMPRESSION: No evidence of acute cardiopulmonary disease. Electronically Signed   By: Julian Hy M.D.   On: 11/24/2015 15:02       Subjective:  Feeling well.  Want to be discharge.    Discharge Exam: Vitals:   11/25/15 2021 11/26/15 0501  BP: (!) 104/57 96/80  Pulse: 84 81  Resp: 18 18  Temp: 98.2 F (36.8 C) 98 F (36.7 C)   Vitals:   11/25/15 0945 11/25/15 1446 11/25/15 2021 11/26/15 0501  BP: 102/64 109/62 (!) 104/57 96/80  Pulse: 99 95 84 81  Resp: 18 20 18 18   Temp: 97.9 F (36.6 C) 98.2 F (36.8 C) 98.2 F (36.8 C) 98 F (36.7 C)  TempSrc: Oral Oral Oral Oral  SpO2: 99% 98% 99% 98%  Weight:    87 kg (191 lb 14.4 oz)  Height:        General: Pt is alert, awake, not in acute distress Cardiovascular: RRR, S1/S2 +, no rubs, no gallops Respiratory: CTA bilaterally, no wheezing, no rhonchi Abdominal: Soft, NT, ND, bowel sounds + Extremities: no edema, no  cyanosis    The results of significant diagnostics from this hospitalization (including imaging, microbiology, ancillary and laboratory) are listed below for reference.     Microbiology: Recent Results (from the past 240 hour(s))  Urine culture     Status: None   Collection Time: 11/24/15  2:57 PM  Result Value Ref Range Status   Specimen Description URINE, CLEAN CATCH  Final   Special Requests NONE  Final   Culture NO GROWTH Performed at [facility]  [facility]   Final   Report Status 11/26/2015 FINAL  Final  Blood Culture (routine x 2)     Status: None (Preliminary result)   Collection Time: 11/24/15  3:56 PM  Result Value Ref Range Status   Specimen Description BLOOD RIGHT ANTECUBITAL DRAWN BY RN  Final   Special Requests   Final    BOTTLES DRAWN AEROBIC AND ANAEROBIC AEB=6CC ANA=4CC   Culture NO GROWTH 2 DAYS  Final   Report Status PENDING  Incomplete  Blood Culture (routine x 2)     Status: None (Preliminary result)   Collection Time: 11/24/15  4:01 PM  Result Value Ref Range Status   Specimen Description BLOOD LEFT HAND  Final   Special Requests BOTTLES DRAWN AEROBIC AND ANAEROBIC 6CC  Final   Culture NO GROWTH 2 DAYS  Final   Report Status PENDING  Incomplete  MRSA PCR Screening     Status: None   Collection Time: 11/24/15  7:48 PM  Result Value Ref Range Status   MRSA by PCR NEGATIVE NEGATIVE Final    Comment:        The GeneXpert MRSA Assay (FDA approved for NASAL specimens only), is one component of a comprehensive MRSA colonization surveillance program. It is not intended to diagnose MRSA infection nor to guide or monitor treatment for MRSA infections.      Basic Metabolic Panel:  Recent Labs Lab 11/24/15 1402 11/25/15 0500 11/26/15 0506  NA 134* 134* 135  K 4.2 4.0 4.0  CL 99* 107 105  CO2 28 23 25   GLUCOSE 84 121* 88  BUN 18 14 12   CREATININE 0.69 0.76 0.68  CALCIUM 8.9 8.0* 8.5*   Liver Function Tests:  Recent Labs Lab 11/24/15 1402  11/26/15 0506  AST 19 22  ALT 35 27  ALKPHOS 82 61  BILITOT 0.8 0.8  PROT 6.9 5.7*  ALBUMIN 3.0* 2.4*    Recent Labs Lab 11/24/15 1402  LIPASE 40   CBC:  Recent Labs Lab 11/24/15 1402 11/25/15 0500 11/26/15 0506  WBC 7.8 5.7 4.9  NEUTROABS 5.2  --   --   HGB 9.8* 8.1* 8.7*  HCT 31.7* 26.0* 28.1*  MCV 91.4 92.5 92.4  PLT 223 171 153   CBG:  Recent Labs Lab 11/24/15 1508  GLUCAP 73   Urinalysis    Component Value Date/Time   COLORURINE YELLOW 11/24/2015 [place] 11/24/2015 1457   LABSPEC 1.010 11/24/2015 1457   PHURINE 6.5 11/24/2015 [place] 11/24/2015 1457   HGBUR NEGATIVE 11/24/2015 1457   BILIRUBINUR NEGATIVE 11/24/2015 [place] (A) 11/24/2015 1457   PROTEINUR NEGATIVE 11/24/2015 1457   NITRITE NEGATIVE 11/24/2015 [place] 11/24/2015 1457   Microbiology Recent Results (from the past 240 hour(s))  Urine culture     Status: None   Collection Time: 11/24/15  2:57 PM  Result Value Ref Range Status   Specimen Description URINE, CLEAN CATCH  Final   Special Requests NONE  Final   Culture NO GROWTH Performed at [facility]   Final   Report Status 11/26/2015 FINAL  Final  Blood Culture (routine x 2)     Status: None (Preliminary result)   Collection Time: 11/24/15  3:56 PM  Result Value Ref Range Status   Specimen Description BLOOD RIGHT ANTECUBITAL DRAWN BY RN  Final   Special Requests   Final    BOTTLES DRAWN AEROBIC AND ANAEROBIC AEB=6CC ANA=4CC  Culture NO GROWTH 2 DAYS  Final   Report Status PENDING  Incomplete  Blood Culture (routine x 2)     Status: None (Preliminary result)   Collection Time: 11/24/15  4:01 PM  Result Value Ref Range Status   Specimen Description BLOOD LEFT HAND  Final   Special Requests BOTTLES DRAWN AEROBIC AND ANAEROBIC 6CC  Final   Culture NO GROWTH 2 DAYS  Final   Report Status PENDING  Incomplete  MRSA PCR Screening     Status: None    Collection Time: 11/24/15  7:48 PM  Result Value Ref Range Status   MRSA by PCR NEGATIVE NEGATIVE Final    Comment:        The GeneXpert MRSA Assay (FDA approved for NASAL specimens only), is one component of a comprehensive MRSA colonization surveillance program. It is not intended to diagnose MRSA infection nor to guide or monitor treatment for MRSA infections.      Time coordinating discharge: Over 30 minutes  SIGNED:  Orvan Falconer, MD FACP [facility] 11/26/2015, 11:22 AM   If 7PM-7AM, please contact night-coverage [email or web address] Password [email or web address]

## 2015-11-26 NOTE — Progress Notes (Signed)
Patient states understanding of discharge instructions.  

## 2015-11-27 DIAGNOSIS — K315 Obstruction of duodenum: Secondary | ICD-10-CM | POA: Diagnosis not present

## 2015-11-27 DIAGNOSIS — K8689 Other specified diseases of pancreas: Secondary | ICD-10-CM | POA: Diagnosis not present

## 2015-11-27 DIAGNOSIS — R112 Nausea with vomiting, unspecified: Secondary | ICD-10-CM | POA: Diagnosis not present

## 2015-11-29 DIAGNOSIS — Z4659 Encounter for fitting and adjustment of other gastrointestinal appliance and device: Secondary | ICD-10-CM | POA: Diagnosis not present

## 2015-11-29 DIAGNOSIS — K219 Gastro-esophageal reflux disease without esophagitis: Secondary | ICD-10-CM | POA: Diagnosis not present

## 2015-11-29 DIAGNOSIS — Z978 Presence of other specified devices: Secondary | ICD-10-CM | POA: Diagnosis not present

## 2015-11-29 DIAGNOSIS — Z79899 Other long term (current) drug therapy: Secondary | ICD-10-CM | POA: Diagnosis not present

## 2015-11-29 DIAGNOSIS — E039 Hypothyroidism, unspecified: Secondary | ICD-10-CM | POA: Diagnosis not present

## 2015-11-29 DIAGNOSIS — Z4589 Encounter for adjustment and management of other implanted devices: Secondary | ICD-10-CM | POA: Diagnosis not present

## 2015-11-29 DIAGNOSIS — Z931 Gastrostomy status: Secondary | ICD-10-CM | POA: Diagnosis not present

## 2015-11-29 LAB — CULTURE, BLOOD (ROUTINE X 2)
CULTURE: NO GROWTH
CULTURE: NO GROWTH

## 2015-12-06 DIAGNOSIS — Z23 Encounter for immunization: Secondary | ICD-10-CM | POA: Diagnosis not present

## 2015-12-07 DIAGNOSIS — A4151 Sepsis due to Escherichia coli [E. coli]: Secondary | ICD-10-CM | POA: Diagnosis not present

## 2015-12-07 DIAGNOSIS — E43 Unspecified severe protein-calorie malnutrition: Secondary | ICD-10-CM | POA: Diagnosis not present

## 2015-12-09 DIAGNOSIS — A4151 Sepsis due to Escherichia coli [E. coli]: Secondary | ICD-10-CM | POA: Diagnosis not present

## 2015-12-09 DIAGNOSIS — E43 Unspecified severe protein-calorie malnutrition: Secondary | ICD-10-CM | POA: Diagnosis not present

## 2016-01-01 DIAGNOSIS — Z431 Encounter for attention to gastrostomy: Secondary | ICD-10-CM | POA: Diagnosis not present

## 2016-01-01 DIAGNOSIS — K8591 Acute pancreatitis with uninfected necrosis, unspecified: Secondary | ICD-10-CM | POA: Diagnosis not present

## 2016-01-01 DIAGNOSIS — R600 Localized edema: Secondary | ICD-10-CM | POA: Diagnosis not present

## 2016-01-01 DIAGNOSIS — E039 Hypothyroidism, unspecified: Secondary | ICD-10-CM | POA: Diagnosis not present

## 2016-01-01 DIAGNOSIS — J9811 Atelectasis: Secondary | ICD-10-CM | POA: Diagnosis not present

## 2016-01-01 DIAGNOSIS — K8689 Other specified diseases of pancreas: Secondary | ICD-10-CM | POA: Diagnosis not present

## 2016-01-01 DIAGNOSIS — R05 Cough: Secondary | ICD-10-CM | POA: Diagnosis not present

## 2016-01-01 DIAGNOSIS — R918 Other nonspecific abnormal finding of lung field: Secondary | ICD-10-CM | POA: Diagnosis not present

## 2016-01-01 DIAGNOSIS — R112 Nausea with vomiting, unspecified: Secondary | ICD-10-CM | POA: Diagnosis not present

## 2016-01-01 DIAGNOSIS — K9429 Other complications of gastrostomy: Secondary | ICD-10-CM | POA: Diagnosis not present

## 2016-01-01 DIAGNOSIS — Z09 Encounter for follow-up examination after completed treatment for conditions other than malignant neoplasm: Secondary | ICD-10-CM | POA: Diagnosis not present

## 2016-01-01 DIAGNOSIS — K315 Obstruction of duodenum: Secondary | ICD-10-CM | POA: Diagnosis not present

## 2016-01-04 DIAGNOSIS — E43 Unspecified severe protein-calorie malnutrition: Secondary | ICD-10-CM | POA: Diagnosis not present

## 2016-01-04 DIAGNOSIS — A4151 Sepsis due to Escherichia coli [E. coli]: Secondary | ICD-10-CM | POA: Diagnosis not present

## 2016-01-08 DIAGNOSIS — A4151 Sepsis due to Escherichia coli [E. coli]: Secondary | ICD-10-CM | POA: Diagnosis not present

## 2016-01-08 DIAGNOSIS — E43 Unspecified severe protein-calorie malnutrition: Secondary | ICD-10-CM | POA: Diagnosis not present

## 2016-01-10 DIAGNOSIS — K8689 Other specified diseases of pancreas: Secondary | ICD-10-CM | POA: Diagnosis not present

## 2016-01-10 DIAGNOSIS — K838 Other specified diseases of biliary tract: Secondary | ICD-10-CM | POA: Diagnosis not present

## 2016-01-10 DIAGNOSIS — K315 Obstruction of duodenum: Secondary | ICD-10-CM | POA: Diagnosis not present

## 2016-01-10 DIAGNOSIS — R932 Abnormal findings on diagnostic imaging of liver and biliary tract: Secondary | ICD-10-CM | POA: Diagnosis not present

## 2016-01-10 DIAGNOSIS — N1330 Unspecified hydronephrosis: Secondary | ICD-10-CM | POA: Diagnosis not present

## 2016-01-10 DIAGNOSIS — J90 Pleural effusion, not elsewhere classified: Secondary | ICD-10-CM | POA: Diagnosis not present

## 2016-01-10 DIAGNOSIS — R188 Other ascites: Secondary | ICD-10-CM | POA: Diagnosis not present

## 2016-01-27 DIAGNOSIS — E43 Unspecified severe protein-calorie malnutrition: Secondary | ICD-10-CM | POA: Diagnosis not present

## 2016-01-27 DIAGNOSIS — A4151 Sepsis due to Escherichia coli [E. coli]: Secondary | ICD-10-CM | POA: Diagnosis not present

## 2016-02-03 DIAGNOSIS — K219 Gastro-esophageal reflux disease without esophagitis: Secondary | ICD-10-CM | POA: Diagnosis not present

## 2016-02-03 DIAGNOSIS — E039 Hypothyroidism, unspecified: Secondary | ICD-10-CM | POA: Diagnosis not present

## 2016-02-03 DIAGNOSIS — K859 Acute pancreatitis without necrosis or infection, unspecified: Secondary | ICD-10-CM | POA: Diagnosis not present

## 2016-02-03 DIAGNOSIS — K838 Other specified diseases of biliary tract: Secondary | ICD-10-CM | POA: Diagnosis not present

## 2016-02-03 DIAGNOSIS — K831 Obstruction of bile duct: Secondary | ICD-10-CM | POA: Diagnosis not present

## 2016-02-03 DIAGNOSIS — Z4659 Encounter for fitting and adjustment of other gastrointestinal appliance and device: Secondary | ICD-10-CM | POA: Diagnosis not present

## 2016-02-03 DIAGNOSIS — D759 Disease of blood and blood-forming organs, unspecified: Secondary | ICD-10-CM | POA: Diagnosis not present

## 2016-02-06 ENCOUNTER — Telehealth (INDEPENDENT_AMBULATORY_CARE_PROVIDER_SITE_OTHER): Payer: Self-pay | Admitting: Internal Medicine

## 2016-02-06 DIAGNOSIS — R112 Nausea with vomiting, unspecified: Secondary | ICD-10-CM | POA: Diagnosis not present

## 2016-02-06 DIAGNOSIS — E860 Dehydration: Secondary | ICD-10-CM | POA: Diagnosis not present

## 2016-02-06 DIAGNOSIS — Z8719 Personal history of other diseases of the digestive system: Secondary | ICD-10-CM | POA: Diagnosis not present

## 2016-02-06 DIAGNOSIS — Z452 Encounter for adjustment and management of vascular access device: Secondary | ICD-10-CM | POA: Diagnosis not present

## 2016-02-06 NOTE — Telephone Encounter (Signed)
Dr. Gayleen Orem of [facility] called regarding Andrew Watson this afternoon. He attempted to remove duodenal stent was not able to do so. Patient had another stent and stent placed. Patient was discharged. She has not been able to keep anything down. Therefore I was asked to arrange for IV fluids that he would not get dehydrated. He is planning to remove stents in 1-2 weeks. Staff at home healthcare was contacted. Patient will receive D5 normal saline 100 mL per hour for 20 hours at which time his condition will be reassessed via [facility] staff.

## 2016-02-08 DIAGNOSIS — E43 Unspecified severe protein-calorie malnutrition: Secondary | ICD-10-CM | POA: Diagnosis not present

## 2016-02-08 DIAGNOSIS — A4151 Sepsis due to Escherichia coli [E. coli]: Secondary | ICD-10-CM | POA: Diagnosis not present

## 2016-02-11 ENCOUNTER — Encounter (INDEPENDENT_AMBULATORY_CARE_PROVIDER_SITE_OTHER): Payer: Self-pay

## 2016-02-11 ENCOUNTER — Ambulatory Visit (INDEPENDENT_AMBULATORY_CARE_PROVIDER_SITE_OTHER): Payer: Medicare Other | Admitting: Internal Medicine

## 2016-02-11 ENCOUNTER — Encounter (INDEPENDENT_AMBULATORY_CARE_PROVIDER_SITE_OTHER): Payer: Self-pay | Admitting: Internal Medicine

## 2016-02-11 VITALS — BP 120/76 | HR 72 | Temp 97.3°F | Resp 18 | Ht 70.0 in | Wt 188.2 lb

## 2016-02-11 DIAGNOSIS — K219 Gastro-esophageal reflux disease without esophagitis: Secondary | ICD-10-CM

## 2016-02-11 DIAGNOSIS — R112 Nausea with vomiting, unspecified: Secondary | ICD-10-CM | POA: Diagnosis not present

## 2016-02-11 DIAGNOSIS — K861 Other chronic pancreatitis: Secondary | ICD-10-CM

## 2016-02-11 NOTE — Progress Notes (Signed)
Presenting complaint;  Nausea vomiting and heartburn.  Database and Subjective:  Patient is 76 year old Caucasian male who presented back in June 2017 with acute pancreatitis and had protracted course. He developed pancreatic pseudocyst duodenal obstruction as well as biliary stricture. Patient was referred to Dr. Gayleen Orem of biliary service at [facility] and has undergone multiple interventions. Patient underwent procedure on 02/03/2015 in order to remove remove duodenal stents biliary stent and remove 2 stents connecting stomach to gallbladder. Proximal duodenal stent was removed without difficulty distal stent was embedded and could not be removed. Therefore 2 fully covered stents were placed pink to facilitate removing previously placed duodenal stent at a later date. He also had another stent placed across gas cholecystostomy. Patient develop nausea vomiting and intractable heartburn day after the procedure. He was not able to keep food or fluids down. Dr. Lysle Rubens contacted me and requested IV fluids and further evaluation. Patient was given IV fluids on 111 and 02/07/2016. He was begun on as omeprazole 20 mg by mouth twice a day and advised to use Phenergan suppositories on as-needed basis. Patient was evaluated by home health care nurse yesterday and he was not feeling well. Vital signs however stable. It was decided to bring him to the office today. Patient feels much better as of this morning. He kept his breakfast down and also eats [employer] sandwich. He is still having intermittent heartburn but not like he was having last week. Also complains of mild epigastric discomfort. He denies fever chills hematemesis melena or rectal bleeding. Patient states he has lost close to 10 pounds in 11 days. He states he was doing very well prior to our stent intervention. He denies postural lightheadedness but this morning he was transiently unsteady on his feet. He denies headache or paresthesias. Patient and  his friend Loletha Carrow are very easily and thankful for the care that they have received at [facility].    Current Medications: Outpatient Encounter Prescriptions as of 02/11/2016  Medication Sig  . Esomeprazole Magnesium (NEXIUM PO) Take by mouth daily. OTC  . levothyroxine (SYNTHROID, LEVOTHROID) 150 MCG tablet Take 150 mcg by mouth daily before breakfast.  . ondansetron (ZOFRAN) 4 MG tablet Take 4 mg by mouth every 8 (eight) hours as needed.  . Pancrelipase, Lip-Prot-Amyl, 24000-76000 units CPEP Take 24,000-48,000 Units by mouth 3 (three) times daily.   . promethazine (PHENERGAN) 12.5 MG tablet TAKE 1 TABLET BY MOUTH EVERY SIX HOURS AS NEEDED. FOR UP TO 7 DAYS  . Simethicone (GAS-X PO) Take by mouth as needed.  . traZODone (DESYREL) 50 MG tablet Take 50 mg by mouth at bedtime.  . traZODone (DESYREL) 50 MG tablet 50 mg by Per J Tube route at bedtime.  . [DISCONTINUED] levothyroxine (SYNTHROID, LEVOTHROID) 150 MCG tablet Take 150 mcg by mouth daily.   . [DISCONTINUED] pantoprazole (PROTONIX) 40 MG tablet Take 1 tablet (40 mg total) by mouth daily. (Patient not taking: Reported on 02/11/2016)   No facility-administered encounter medications on file as of 02/11/2016.      Objective: Blood pressure 120/76, pulse 72, temperature 97.3 F (36.3 C), temperature source Oral, resp. rate 18, height 5\' 10"  (1.778 m), weight 188 lb 3.2 oz (85.4 kg). Patient is alert and in no acute distress. Conjunctiva is pink. Sclera is nonicteric Oropharyngeal mucosa is normal. No neck masses or thyromegaly noted. Cardiac exam with regular rhythm normal S1 and S2. No murmur or gallop noted. Lungs are clear to auscultation. Abdomen is symmetrical. Bowel sounds are normal.  On palpation abdomen is soft. He has fullness and mild tenderness in midepigastric region but no organomegaly or masses noted.  No LE edema or clubbing noted.  Labs/studies Results: Creatinine on 01/10/2016 was 0.7    LFTs from 11/02/2015  as follows Bilirubin 0.9, AP 172, AST 57, ALT 108 and albumin 2.7.   Assessment:  #1. Postprocedure nausea and vomiting and intractable heartburn most likely secondary to duodenal stent manipulation. Only one of the 2 stents could be removed. He is feeling better as of this morning and therefore gastric emptying must have improved. Heartburn and what appeared to be secondary to above and he is responding to PPI which he should continue for time being. #2. History of pancreatitis(June 0000000) complicated by biliary and duodenal strictures treated with endoscopic stenting. #3. Weight loss. All in all he has lost 50 pounds since his illness started 7 months ago. Recent weight loss of 10 pounds appear to be due to acute symptoms and he seemed to be finally getting better.   Plan:  Patient will continue as omeprazole OTC 20 mg by mouth twice a day. Patient encouraged to increase by mouth intake of fluids. Patient will call if nausea and vomiting recurs. Patient will have duodenal stents removed later this week at [facility]. Office visit on as-needed basis.

## 2016-02-11 NOTE — Patient Instructions (Signed)
Continue OTC Nexium or esomeprazole 20 mg by mouth twice daily. Please call office with progress report next week.

## 2016-02-12 ENCOUNTER — Ambulatory Visit (INDEPENDENT_AMBULATORY_CARE_PROVIDER_SITE_OTHER): Payer: Medicare Other | Admitting: Internal Medicine

## 2016-02-14 DIAGNOSIS — E039 Hypothyroidism, unspecified: Secondary | ICD-10-CM | POA: Diagnosis not present

## 2016-02-14 DIAGNOSIS — R945 Abnormal results of liver function studies: Secondary | ICD-10-CM | POA: Diagnosis not present

## 2016-02-14 DIAGNOSIS — T85528A Displacement of other gastrointestinal prosthetic devices, implants and grafts, initial encounter: Secondary | ICD-10-CM | POA: Diagnosis not present

## 2016-02-14 DIAGNOSIS — K315 Obstruction of duodenum: Secondary | ICD-10-CM | POA: Diagnosis not present

## 2016-02-14 DIAGNOSIS — Z4659 Encounter for fitting and adjustment of other gastrointestinal appliance and device: Secondary | ICD-10-CM | POA: Diagnosis not present

## 2016-02-14 DIAGNOSIS — K219 Gastro-esophageal reflux disease without esophagitis: Secondary | ICD-10-CM | POA: Diagnosis not present

## 2016-02-14 DIAGNOSIS — K838 Other specified diseases of biliary tract: Secondary | ICD-10-CM | POA: Diagnosis not present

## 2016-02-14 DIAGNOSIS — K831 Obstruction of bile duct: Secondary | ICD-10-CM | POA: Diagnosis not present

## 2016-02-14 DIAGNOSIS — K8689 Other specified diseases of pancreas: Secondary | ICD-10-CM | POA: Diagnosis not present

## 2016-02-14 DIAGNOSIS — R7989 Other specified abnormal findings of blood chemistry: Secondary | ICD-10-CM | POA: Diagnosis not present

## 2016-02-14 DIAGNOSIS — K3189 Other diseases of stomach and duodenum: Secondary | ICD-10-CM | POA: Diagnosis not present

## 2016-02-14 DIAGNOSIS — R112 Nausea with vomiting, unspecified: Secondary | ICD-10-CM | POA: Diagnosis not present

## 2016-02-19 DIAGNOSIS — K838 Other specified diseases of biliary tract: Secondary | ICD-10-CM | POA: Diagnosis not present

## 2016-02-24 DIAGNOSIS — R1030 Lower abdominal pain, unspecified: Secondary | ICD-10-CM | POA: Diagnosis not present

## 2016-02-24 DIAGNOSIS — E039 Hypothyroidism, unspecified: Secondary | ICD-10-CM | POA: Diagnosis not present

## 2016-02-24 DIAGNOSIS — N50819 Testicular pain, unspecified: Secondary | ICD-10-CM | POA: Diagnosis not present

## 2016-02-24 DIAGNOSIS — K861 Other chronic pancreatitis: Secondary | ICD-10-CM | POA: Diagnosis not present

## 2016-04-17 DIAGNOSIS — Z6828 Body mass index (BMI) 28.0-28.9, adult: Secondary | ICD-10-CM | POA: Diagnosis not present

## 2016-04-17 DIAGNOSIS — M19049 Primary osteoarthritis, unspecified hand: Secondary | ICD-10-CM | POA: Diagnosis not present

## 2016-04-17 DIAGNOSIS — M169 Osteoarthritis of hip, unspecified: Secondary | ICD-10-CM | POA: Diagnosis not present

## 2016-04-19 ENCOUNTER — Inpatient Hospital Stay (HOSPITAL_COMMUNITY)
Admission: EM | Admit: 2016-04-19 | Discharge: 2016-04-22 | DRG: 372 | Disposition: A | Discharge status: Home Health | Payer: Medicare Other | Attending: Internal Medicine | Admitting: Internal Medicine

## 2016-04-19 ENCOUNTER — Emergency Department (HOSPITAL_COMMUNITY): Payer: Medicare Other

## 2016-04-19 ENCOUNTER — Encounter (HOSPITAL_COMMUNITY): Payer: Self-pay | Admitting: Emergency Medicine

## 2016-04-19 ENCOUNTER — Emergency Department (HOSPITAL_COMMUNITY)
Admission: EM | Admit: 2016-04-19 | Discharge: 2016-04-19 | Disposition: A | Discharge status: Home / Self Care | Payer: Medicare Other | Source: Home / Self Care | Attending: Emergency Medicine | Admitting: Emergency Medicine

## 2016-04-19 DIAGNOSIS — Z980 Intestinal bypass and anastomosis status: Secondary | ICD-10-CM | POA: Diagnosis not present

## 2016-04-19 DIAGNOSIS — E039 Hypothyroidism, unspecified: Secondary | ICD-10-CM

## 2016-04-19 DIAGNOSIS — K6819 Other retroperitoneal abscess: Secondary | ICD-10-CM

## 2016-04-19 DIAGNOSIS — R509 Fever, unspecified: Secondary | ICD-10-CM | POA: Insufficient documentation

## 2016-04-19 DIAGNOSIS — M25552 Pain in left hip: Secondary | ICD-10-CM | POA: Insufficient documentation

## 2016-04-19 DIAGNOSIS — R319 Hematuria, unspecified: Secondary | ICD-10-CM

## 2016-04-19 DIAGNOSIS — L02211 Cutaneous abscess of abdominal wall: Secondary | ICD-10-CM | POA: Diagnosis present

## 2016-04-19 DIAGNOSIS — Z8719 Personal history of other diseases of the digestive system: Secondary | ICD-10-CM

## 2016-04-19 DIAGNOSIS — K861 Other chronic pancreatitis: Secondary | ICD-10-CM | POA: Diagnosis present

## 2016-04-19 DIAGNOSIS — D696 Thrombocytopenia, unspecified: Secondary | ICD-10-CM | POA: Diagnosis present

## 2016-04-19 DIAGNOSIS — E038 Other specified hypothyroidism: Secondary | ICD-10-CM | POA: Diagnosis not present

## 2016-04-19 DIAGNOSIS — M1990 Unspecified osteoarthritis, unspecified site: Secondary | ICD-10-CM | POA: Diagnosis present

## 2016-04-19 DIAGNOSIS — R188 Other ascites: Secondary | ICD-10-CM | POA: Diagnosis not present

## 2016-04-19 DIAGNOSIS — K8681 Exocrine pancreatic insufficiency: Secondary | ICD-10-CM | POA: Diagnosis present

## 2016-04-19 DIAGNOSIS — K863 Pseudocyst of pancreas: Secondary | ICD-10-CM | POA: Diagnosis not present

## 2016-04-19 DIAGNOSIS — M109 Gout, unspecified: Secondary | ICD-10-CM | POA: Diagnosis present

## 2016-04-19 DIAGNOSIS — Z79899 Other long term (current) drug therapy: Secondary | ICD-10-CM

## 2016-04-19 DIAGNOSIS — Z931 Gastrostomy status: Secondary | ICD-10-CM

## 2016-04-19 DIAGNOSIS — I4891 Unspecified atrial fibrillation: Secondary | ICD-10-CM | POA: Diagnosis present

## 2016-04-19 DIAGNOSIS — I480 Paroxysmal atrial fibrillation: Secondary | ICD-10-CM | POA: Diagnosis not present

## 2016-04-19 LAB — COMPREHENSIVE METABOLIC PANEL
ALBUMIN: 3.1 g/dL — AB (ref 3.5–5.0)
ALK PHOS: 44 U/L (ref 38–126)
ALT: 11 U/L — AB (ref 17–63)
ALT: 14 U/L — ABNORMAL LOW (ref 17–63)
AST: 13 U/L — AB (ref 15–41)
AST: 18 U/L (ref 15–41)
Albumin: 2.9 g/dL — ABNORMAL LOW (ref 3.5–5.0)
Alkaline Phosphatase: 46 U/L (ref 38–126)
Anion gap: 7 (ref 5–15)
Anion gap: 6 (ref 5–15)
BUN: 18 mg/dL (ref 6–20)
BUN: 17 mg/dL (ref 6–20)
CALCIUM: 8.7 mg/dL — AB (ref 8.9–10.3)
CHLORIDE: 101 mmol/L (ref 101–111)
CO2: 24 mmol/L (ref 22–32)
CO2: 26 mmol/L (ref 22–32)
CREATININE: 0.95 mg/dL (ref 0.61–1.24)
Calcium: 8.6 mg/dL — ABNORMAL LOW (ref 8.9–10.3)
Chloride: 102 mmol/L (ref 101–111)
Creatinine, Ser: 1.05 mg/dL (ref 0.61–1.24)
GFR calc Af Amer: >60 mL/min (ref >60)
GFR calc non Af Amer: >60 mL/min (ref >60)
GLUCOSE: 107 mg/dL — AB (ref 65–99)
Glucose, Bld: 118 mg/dL — ABNORMAL HIGH (ref 65–99)
POTASSIUM: 3.8 mmol/L (ref 3.5–5.1)
Potassium: 3.8 mmol/L (ref 3.5–5.1)
SODIUM: 133 mmol/L — AB (ref 135–145)
SODIUM: 133 mmol/L — AB (ref 135–145)
Total Bilirubin: 1.8 mg/dL — ABNORMAL HIGH (ref 0.3–1.2)
Total Bilirubin: 1.5 mg/dL — ABNORMAL HIGH (ref 0.3–1.2)
Total Protein: 7 g/dL (ref 6.5–8.1)
Total Protein: 6.5 g/dL (ref 6.5–8.1)

## 2016-04-19 LAB — URINALYSIS, ROUTINE W REFLEX MICROSCOPIC
BILIRUBIN URINE: NEGATIVE
Bacteria, UA: NONE SEEN
GLUCOSE, UA: NEGATIVE mg/dL
Ketones, ur: NEGATIVE mg/dL
LEUKOCYTES UA: NEGATIVE
NITRITE: NEGATIVE
PH: 5 (ref 5.0–8.0)
Protein, ur: NEGATIVE mg/dL
Specific Gravity, Urine: >1.046 — ABNORMAL HIGH (ref 1.005–1.030)
Squamous Epithelial / LPF: NONE SEEN

## 2016-04-19 LAB — CBC WITH DIFFERENTIAL/PLATELET
BASOS ABS: 0 10*3/uL (ref 0.0–0.1)
BASOS PCT: 0 %
Basophils Absolute: 0 10*3/uL (ref 0.0–0.1)
Basophils Relative: 0 %
EOS ABS: 0 10*3/uL (ref 0.0–0.7)
EOS ABS: 0 10*3/uL (ref 0.0–0.7)
EOS PCT: 0 %
Eosinophils Relative: 0 %
HCT: 32.6 % — ABNORMAL LOW (ref 39.0–52.0)
HCT: 29.9 % — ABNORMAL LOW (ref 39.0–52.0)
HEMOGLOBIN: 10.7 g/dL — AB (ref 13.0–17.0)
Hemoglobin: 9.9 g/dL — ABNORMAL LOW (ref 13.0–17.0)
LYMPHS ABS: 0.8 10*3/uL (ref 0.7–4.0)
LYMPHS PCT: 9 %
LYMPHS PCT: 7 %
Lymphs Abs: 0.6 10*3/uL — ABNORMAL LOW (ref 0.7–4.0)
MCH: 29.3 pg (ref 26.0–34.0)
MCH: 29.6 pg (ref 26.0–34.0)
MCHC: 32.8 g/dL (ref 30.0–36.0)
MCHC: 33.1 g/dL (ref 30.0–36.0)
MCV: 89.3 fL (ref 78.0–100.0)
MCV: 89.3 fL (ref 78.0–100.0)
MONO ABS: 1.7 10*3/uL — AB (ref 0.1–1.0)
Monocytes Absolute: 1.7 10*3/uL — ABNORMAL HIGH (ref 0.1–1.0)
Monocytes Relative: 20 %
Monocytes Relative: 18 %
Neutro Abs: 6 10*3/uL (ref 1.7–7.7)
Neutro Abs: 6.7 10*3/uL (ref 1.7–7.7)
Neutrophils Relative %: 71 %
Neutrophils Relative %: 75 %
PLATELETS: 116 10*3/uL — AB (ref 150–400)
Platelets: 119 10*3/uL — ABNORMAL LOW (ref 150–400)
RBC: 3.65 MIL/uL — AB (ref 4.22–5.81)
RBC: 3.35 MIL/uL — ABNORMAL LOW (ref 4.22–5.81)
RDW: 14.1 % (ref 11.5–15.5)
RDW: 14.1 % (ref 11.5–15.5)
WBC: 8.4 10*3/uL (ref 4.0–10.5)
WBC: 9 10*3/uL (ref 4.0–10.5)

## 2016-04-19 LAB — LIPASE, BLOOD: Lipase: 29 U/L (ref 11–51)

## 2016-04-19 MED ORDER — VANCOMYCIN HCL IN DEXTROSE 1-5 GM/200ML-% IV SOLN
1000.0000 mg | Freq: Once | INTRAVENOUS | Status: AC
  Administered 2016-04-20: 1000 mg via INTRAVENOUS
  Filled 2016-04-19: qty 200

## 2016-04-19 MED ORDER — PIPERACILLIN-TAZOBACTAM 3.375 G IVPB 30 MIN
3.3750 g | Freq: Once | INTRAVENOUS | Status: AC
  Administered 2016-04-20: 3.375 g via INTRAVENOUS
  Filled 2016-04-19: qty 50

## 2016-04-19 MED ORDER — SODIUM CHLORIDE 0.9 % IV BOLUS (SEPSIS)
1000.0000 mL | Freq: Once | INTRAVENOUS | Status: AC
  Administered 2016-04-19: 1000 mL via INTRAVENOUS

## 2016-04-19 MED ORDER — ACETAMINOPHEN 325 MG PO TABS
650.0000 mg | ORAL_TABLET | Freq: Once | ORAL | Status: AC
  Administered 2016-04-19: 650 mg via ORAL
  Filled 2016-04-19: qty 2

## 2016-04-19 MED ORDER — FENTANYL CITRATE (PF) 100 MCG/2ML IJ SOLN
50.0000 ug | Freq: Once | INTRAMUSCULAR | Status: AC
  Administered 2016-04-19: 50 ug via INTRAVENOUS
  Filled 2016-04-19: qty 2

## 2016-04-19 MED ORDER — MORPHINE SULFATE (PF) 4 MG/ML IV SOLN
8.0000 mg | Freq: Once | INTRAVENOUS | Status: AC
  Administered 2016-04-19: 8 mg via INTRAVENOUS
  Filled 2016-04-19: qty 2

## 2016-04-19 MED ORDER — IOPAMIDOL (ISOVUE-300) INJECTION 61%
100.0000 mL | Freq: Once | INTRAVENOUS | Status: AC | PRN
Start: 2016-04-19 — End: 2016-04-19
  Administered 2016-04-19: 100 mL via INTRAVENOUS

## 2016-04-19 NOTE — ED Triage Notes (Signed)
Patient c/o left groin pain that started 2 weeks ago. Patient reports needs surgery on left hip and contributed pain to that. Patient states right hip started hurting and contributed that to walking different due to left groin pain. Patient now has fever and "indigestion" that started 2 days ago. Denies any nausea, vomiting, or diarrhea. Patient states some changes in urinary flow. Patient also states hx of pancreatitis. Patient diaphoretic. Per patient fever of 100.9 in which he took tylenol at 8:30am. Afebrile at this time. Denies any chest pain or shortness of breath.

## 2016-04-19 NOTE — ED Triage Notes (Signed)
Pt returns, state he had a quart of bloody pus drainage from abdomen. Pt has tubes placed in abdomen for pancreatis, tubes was removed in Dec. Pt states he had chills this morning

## 2016-04-19 NOTE — ED Notes (Signed)
Awaiting recehck and dispo

## 2016-04-19 NOTE — ED Provider Notes (Signed)
[facility] DEPT Provider Note   CSN: [id number] Arrival date & time: 04/19/16  2116 By signing my name below, I, Georgette Shell, attest that this documentation has been prepared under the direction and in the presence of Jola Schmidt, MD. Electronically Signed: Georgette Shell, ED Scribe. 04/19/16. 11:29 PM.  History   Chief Complaint Chief Complaint  Patient presents with  . Wound Infection    HPI The history is provided by the patient. No language interpreter was used.   HPI Comments: Andrew Watson is a 76 y.o. male with h/o gout, PE, pancreatitis, and A-fib, who presents to the Emergency Department complaining of a moderate, gradually worsening area of pain and swelling to the abdomen beginning one week ago and worsening this morning. Pt states there is purulent and bloody drainage present from the site, further noting that there was about a quart of drainage tonight. Pt also has a fever (Tmax 100.9) since this morning. Patient has a history of pancreatitis and pancreatic pseudocyst with a fluid collection status post IR drain placement.  This had since resolved but now it feels like he is having drainage from his old drain site.    Pt states pain is exacerbated with palpation and direct pressure.  Additionally, pt states he's had bilateral hip pain worsening the past several days. He states it seems to worsen when ambulating. Pt also notes he has been more active recently. Pt has taken Tylenol for this with no relief to his symptoms. Pt NAD.  PCP: Wende Neighbors, MD GI: Gayleen Orem, MD   Past Medical History:  Diagnosis Date  . Acute pancreatitis   . Arthritis   . Gout   . Thyroid disease     Patient Active Problem List   Diagnosis Date Noted  . Hypotension 11/24/2015  . History of pancreatitis 11/24/2015  . Jejunostomy tube in situ ([place]) 11/24/2015  . Volume depletion 11/24/2015  . Pancreatitis 08/12/2015  . Cholangitis 08/12/2015  . Pulmonary emboli ([place]) 08/12/2015  . Duodenal  stricture 08/10/2015  . Gastric outlet obstruction 08/10/2015  . H/O bypass gastrojejunostomy 08/10/2015  . Pancreatic pseudocyst 08/01/2015  . Alcohol use 07/03/2015  . Atrial fibrillation ([place]) 07/03/2015  . Acute pancreatitis 06/26/2015  . Insomnia 05/15/2011  . Pericarditis 05/09/2011  . Hypothyroidism 05/08/2011    Past Surgical History:  Procedure Laterality Date  . BACK SURGERY    . COLONOSCOPY  07/08/2011   Procedure: COLONOSCOPY;  Surgeon: Rogene Houston, MD;  Location[facility];  Service: Endoscopy;  Laterality: N/A;  930  . JEJUNOSTOMY FEEDING TUBE    . KNEE ARTHROSCOPY    . LEFT HEART CATHETERIZATION WITH CORONARY ANGIOGRAM N/A 05/07/2011   Procedure: LEFT HEART CATHETERIZATION WITH CORONARY ANGIOGRAM;  Surgeon: Peter M Martinique, MD;  Location: [facility] CATH LAB;  Service: Cardiovascular;  Laterality: N/A;  . PEG TUBE PLACEMENT       Home Medications    Prior to Admission medications   Medication Sig Start Date End Date Taking? Authorizing Provider  levothyroxine (SYNTHROID, LEVOTHROID) 175 MCG tablet Take 175 mcg by mouth daily before breakfast.    Historical Provider, MD  ondansetron (ZOFRAN) 4 MG tablet Take 4 mg by mouth every 8 (eight) hours as needed. 11/13/15   Historical Provider, MD  Pancrelipase, Lip-Prot-Amyl, 24000-76000 units CPEP Take 24,000-48,000 Units by mouth 3 (three) times daily.  11/08/15   Historical Provider, MD  Simethicone (GAS-X PO) Take by mouth as needed.    Historical Provider, MD  Family History No family history on file.  Social History Social History  Substance Use Topics  . Smoking status: Never Smoker  . Smokeless tobacco: Never Used  . Alcohol use No     Comment: none since May 31st     Allergies   Patient has no known allergies.   Review of Systems Review of Systems 10 Systems reviewed and all are negative for acute change except as noted in the HPI. Physical Exam Updated Vital Signs BP (!) 107/58 (BP Location:  Right Arm)   Pulse 100   Temp 100 F (37.8 C)   Resp 20   Ht 5\' 11"  (1.803 m)   Wt 200 lb (90.7 kg)   SpO2 95%   BMI 27.89 kg/m   Physical Exam  Constitutional: He is oriented to person, place, and time. He appears well-developed and well-nourished.  HENT:  Head: Normocephalic and atraumatic.  Eyes: EOM are normal.  Neck: Normal range of motion.  Cardiovascular: Normal rate, regular rhythm, normal heart sounds and intact distal pulses.   Pulmonary/Chest: Effort normal and breath sounds normal. No respiratory distress.  Abdominal: Soft. He exhibits no distension. There is no tenderness.  Musculoskeletal: Normal range of motion.  Neurological: He is alert and oriented to person, place, and time.  Skin: Skin is warm and dry. There is erythema.  Erythema in the RLQ with chronic wound and purulent drainage.  Psychiatric: He has a normal mood and affect. Judgment normal.  Nursing note and vitals reviewed.    ED Treatments / Results  DIAGNOSTIC STUDIES: Oxygen Saturation is 95% on RA, adequate by my interpretation.   COORDINATION OF CARE: 11:29 PM-Discussed next steps with pt. Pt verbalized understanding and is agreeable with the plan.   Labs (all labs ordered are listed, but only abnormal results are displayed) Labs Reviewed  CBC WITH DIFFERENTIAL/PLATELET - Abnormal; Notable for the following:       Result Value   RBC 3.35 (*)    Hemoglobin 9.9 (*)    HCT 29.9 (*)    Platelets 116 (*)    Lymphs Abs 0.6 (*)    Monocytes Absolute 1.7 (*)    All other components within normal limits  COMPREHENSIVE METABOLIC PANEL - Abnormal; Notable for the following:    Sodium 133 (*)    Glucose, Bld 118 (*)    Calcium 8.6 (*)    Albumin 2.9 (*)    ALT 14 (*)    Total Bilirubin 1.5 (*)    All other components within normal limits  CULTURE, BLOOD (ROUTINE X 2)  CULTURE, BLOOD (ROUTINE X 2)    EKG  EKG Interpretation None       Radiology Ct Abdomen Pelvis W  Contrast  Result Date: 04/19/2016 CLINICAL DATA:  Patient with fever and indigestion.  Left hip pain. EXAM: CT ABDOMEN AND PELVIS WITH CONTRAST TECHNIQUE: Multidetector CT imaging of the abdomen and pelvis was performed using the standard protocol following bolus administration of intravenous contrast. CONTRAST:  156mL ISOVUE-300 IOPAMIDOL (ISOVUE-300) INJECTION 61% COMPARISON:  MRI abdomen 07/27/2015 FINDINGS: Lower chest: Normal heart size. No pericardial effusion. Consolidative opacities within the right lower lobe. Small right pleural effusion. Hepatobiliary: Liver is normal in size and contour. Pneumobilia within the left hepatic lobe. Gas within the gallbladder lumen. Stent is demonstrated coursing from the gastric antrum through the common bile duct into the duodenum. Pancreas: Mild stranding about the pancreatic head/ uncinate process. Spleen: Enlarged measuring 15.7 cm. Adrenals/Urinary Tract: Adrenal glands are normal.  Multiple left-sided parapelvic cysts. Unchanged cyst interpolar region left kidney. 5 mm stone inferior pole left kidney. There is mild right hydronephrosis. Urinary bladder is unremarkable. Stomach/Bowel: Mild nonspecific wall thickening of the second and third portion of the duodenum. No free intraperitoneal air. Normal morphology of the stomach. Vascular/Lymphatic: Normal caliber abdominal aorta. Peripheral calcified atherosclerotic plaque. No retroperitoneal lymphadenopathy. Reproductive: Central dystrophic calcifications in the prostate. Other: Interval decrease in size of fluid collection within the right retroperitoneum measuring up to 6.3 x 4.5 cm inferiorly (image 63; series 2), previously 11.5 x 8.3 cm. This appears to communicate with the overlying skin within the right lower quadrant (image 66; series 2). Small bilateral fat containing inguinal hernias. Musculoskeletal: No aggressive or acute appearing osseous lesions. Lumbar spine degenerative changes. IMPRESSION: Interval  decrease in size of right retroperitoneal fluid collection extending anterior to the ileus psoas muscle. This collection appears to communicate with the overlying skin within the right lower quadrant. Interval stent placement which appears to communicate between the gastric antrum, common bile duct and duodenum. There is marked wall thickening of the second and third portion of the duodenum which is nonspecific and may be infectious or inflammatory in etiology. There is fat stranding about the uncinate process and the duodenum. Associated pancreatitis should be considered. Persistent mild right hydronephrosis. This is likely secondary to inflammatory changes and fluid along the course of the right ureter. Aortic atherosclerosis. Splenomegaly. Small bilateral fat containing inguinal hernias. Electronically Signed   By: Lovey Newcomer M.D.   On: 04/19/2016 12:23    Procedures Procedures (including critical care time)  Medications Ordered in ED Medications  piperacillin-tazobactam (ZOSYN) IVPB 3.375 g (not administered)  vancomycin (VANCOCIN) IVPB 1000 mg/200 mL premix (not administered)  morphine 4 MG/ML injection 8 mg (not administered)  sodium chloride 0.9 % bolus 1,000 mL (1,000 mLs Intravenous New Bag/Given 04/19/16 2303)     Initial Impression / Assessment and Plan / ED Course  I have reviewed the triage vital signs and the nursing notes.  Pertinent labs & imaging results that were available during my care of the patient were reviewed by me and considered in my medical decision making (see chart for details).     I think this is recurrent retroperitoneal fluid.  He previously been drained and I suspect that that had improved and now he has had gradual worsening of his symptoms again.  I think he is reaccumulating fluid.  CT was performed earlier in the day in the emergency department.  At this time and the patient's retroperitoneal abscess is spontaneously draining on its own.  Wound culture  obtained.  Patient be admitted the hospital for IV antibiotics this time.  I do not think he needs acute surgical intervention as this abscess appears to be draining completely on its own.  Final Clinical Impressions(s) / ED Diagnoses   Final diagnoses:  Retroperitoneal abscess [facility])    New Prescriptions Current Discharge Medication List     I personally performed the services described in this documentation, which was scribed in my presence. The recorded information has been reviewed and is accurate.        Jola Schmidt, MD 04/20/16 [phone]

## 2016-04-19 NOTE — ED Notes (Signed)
SO reports contrast is in

## 2016-04-19 NOTE — ED Notes (Signed)
Dr in to reassess

## 2016-04-19 NOTE — ED Notes (Signed)
Patient transported to CT 

## 2016-04-19 NOTE — ED Provider Notes (Signed)
[facility] DEPT Provider Note   CSN: [id number] Arrival date & time: 04/19/16  4098  By signing my name below, I, Delton Prairie, attest that this documentation has been prepared under the direction and in the presence of Merrily Pew, MD  Electronically Signed: Delton Prairie, ED Scribe. 04/19/16. 10:40 AM.   History   Chief Complaint Chief Complaint  Patient presents with  . Groin Pain    HPI Comments:  Andrew Watson is a 76 y.o. male, with a PMHx of pancreatitis and atrial fibrillation, who presents to the Emergency Department complaining of acute onset left groin pain x today. His pain is worse with palpation. Pt also reports generalized body aches, fever (tmax 100.9), generalized weakness, fatigue, mid back pain and bilateral hip pain. He states he is planning on having surgery to his left hip but states his right hip pain is new due to walking differently. Pt states he urinated about 20 times yesterday night but notes his flow was minimal. Pt has had Tylenol with mild relief. Pt denies chest pain, abdominal pain, upper respiratory symptoms or any other associated symptoms. No other complaints noted.   The history is provided by the patient. No language interpreter was used.    Past Medical History:  Diagnosis Date  . Acute pancreatitis   . Arthritis   . Gout   . Thyroid disease     Patient Active Problem List   Diagnosis Date Noted  . Hypotension 11/24/2015  . History of pancreatitis 11/24/2015  . Jejunostomy tube in situ ([place]) 11/24/2015  . Volume depletion 11/24/2015  . Pancreatitis 08/12/2015  . Cholangitis 08/12/2015  . Pulmonary emboli ([place]) 08/12/2015  . Duodenal stricture 08/10/2015  . Gastric outlet obstruction 08/10/2015  . H/O bypass gastrojejunostomy 08/10/2015  . Pancreatic pseudocyst 08/01/2015  . Alcohol use 07/03/2015  . Atrial fibrillation ([place]) 07/03/2015  . Acute pancreatitis 06/26/2015  . Insomnia 05/15/2011  . Pericarditis 05/09/2011  .  Hypothyroidism 05/08/2011    Past Surgical History:  Procedure Laterality Date  . BACK SURGERY    . COLONOSCOPY  07/08/2011   Procedure: COLONOSCOPY;  Surgeon: Rogene Houston, MD;  Location[facility];  Service: Endoscopy;  Laterality: N/A;  930  . JEJUNOSTOMY FEEDING TUBE    . KNEE ARTHROSCOPY    . LEFT HEART CATHETERIZATION WITH CORONARY ANGIOGRAM N/A 05/07/2011   Procedure: LEFT HEART CATHETERIZATION WITH CORONARY ANGIOGRAM;  Surgeon: Peter M Martinique, MD;  Location: [facility] CATH LAB;  Service: Cardiovascular;  Laterality: N/A;  . PEG TUBE PLACEMENT         Home Medications    Prior to Admission medications   Medication Sig Start Date End Date Taking? Authorizing Provider  Esomeprazole Magnesium (NEXIUM PO) Take by mouth daily. OTC    Historical Provider, MD  levothyroxine (SYNTHROID, LEVOTHROID) 150 MCG tablet Take 150 mcg by mouth daily before breakfast.    Historical Provider, MD  ondansetron (ZOFRAN) 4 MG tablet Take 4 mg by mouth every 8 (eight) hours as needed. 11/13/15   Historical Provider, MD  Pancrelipase, Lip-Prot-Amyl, 24000-76000 units CPEP Take 24,000-48,000 Units by mouth 3 (three) times daily.  11/08/15   Historical Provider, MD  promethazine (PHENERGAN) 12.5 MG tablet TAKE 1 TABLET BY MOUTH EVERY SIX HOURS AS NEEDED. FOR UP TO 7 DAYS 11/07/15   Historical Provider, MD  Simethicone (GAS-X PO) Take by mouth as needed.    Historical Provider, MD  traZODone (DESYREL) 50 MG tablet 50 mg by Per J Tube route at  bedtime. 11/07/15 12/07/15  Historical Provider, MD  traZODone (DESYREL) 50 MG tablet Take 50 mg by mouth at bedtime.    Historical Provider, MD    Family History No family history on file.  Social History Social History  Substance Use Topics  . Smoking status: Never Smoker  . Smokeless tobacco: Never Used  . Alcohol use No     Comment: none since May 31st     Allergies   Patient has no known allergies.   Review of Systems Review of Systems    Constitutional: Positive for fatigue and fever.  HENT: Negative for congestion and rhinorrhea.   Respiratory: Negative for cough.   Cardiovascular: Negative for chest pain.  Gastrointestinal: Negative for abdominal pain.  Musculoskeletal: Positive for back pain and myalgias.  Neurological: Positive for weakness.  All other systems reviewed and are negative.  Physical Exam Updated Vital Signs BP 97/64 (BP Location: Left Arm)   Pulse 96   Temp 98.4 F (36.9 C) (Oral)   Resp (!) 28   Ht 6' (1.829 m)   Wt 200 lb (90.7 kg)   SpO2 95%   BMI 27.12 kg/m   Physical Exam  Constitutional: He is oriented to person, place, and time. He appears well-developed and well-nourished. No distress.  HENT:  Head: Normocephalic and atraumatic.  Eyes: Conjunctivae are normal.  Neck: Normal range of motion.  Cardiovascular: Normal rate.   Pulmonary/Chest: Effort normal.  Abdominal: Soft. He exhibits no distension. There is no tenderness.  Abdomen is benign but is tan.   Genitourinary:  Genitourinary Comments: Mild tenderness left inguinal area. No obvious swelling noted.   Neurological: He is alert and oriented to person, place, and time.  Skin: Skin is warm and dry.  Psychiatric: He has a normal mood and affect.  Nursing note and vitals reviewed.  ED Treatments / Results  DIAGNOSTIC STUDIES:  Oxygen Saturation is 95% on RA, normal by my interpretation.    COORDINATION OF CARE:  10:33 AM Discussed treatment plan with pt at bedside and pt agreed to plan.  Labs (all labs ordered are listed, but only abnormal results are displayed) Labs Reviewed  URINALYSIS, ROUTINE W REFLEX MICROSCOPIC - Abnormal; Notable for the following:       Result Value   Specific Gravity, Urine >1.046 (*)    Hgb urine dipstick MODERATE (*)    All other components within normal limits  CBC WITH DIFFERENTIAL/PLATELET - Abnormal; Notable for the following:    RBC 3.65 (*)    Hemoglobin 10.7 (*)    HCT 32.6 (*)     Platelets 119 (*)    Monocytes Absolute 1.7 (*)    All other components within normal limits  COMPREHENSIVE METABOLIC PANEL - Abnormal; Notable for the following:    Sodium 133 (*)    Glucose, Bld 107 (*)    Calcium 8.7 (*)    Albumin 3.1 (*)    AST 13 (*)    ALT 11 (*)    Total Bilirubin 1.8 (*)    All other components within normal limits  LIPASE, BLOOD    EKG  EKG Interpretation None       Radiology Ct Abdomen Pelvis W Contrast  Result Date: 04/19/2016 CLINICAL DATA:  Patient with fever and indigestion.  Left hip pain. EXAM: CT ABDOMEN AND PELVIS WITH CONTRAST TECHNIQUE: Multidetector CT imaging of the abdomen and pelvis was performed using the standard protocol following bolus administration of intravenous contrast. CONTRAST:  166mL ISOVUE-300 IOPAMIDOL (ISOVUE-300)  INJECTION 61% COMPARISON:  MRI abdomen 07/27/2015 FINDINGS: Lower chest: Normal heart size. No pericardial effusion. Consolidative opacities within the right lower lobe. Small right pleural effusion. Hepatobiliary: Liver is normal in size and contour. Pneumobilia within the left hepatic lobe. Gas within the gallbladder lumen. Stent is demonstrated coursing from the gastric antrum through the common bile duct into the duodenum. Pancreas: Mild stranding about the pancreatic head/ uncinate process. Spleen: Enlarged measuring 15.7 cm. Adrenals/Urinary Tract: Adrenal glands are normal. Multiple left-sided parapelvic cysts. Unchanged cyst interpolar region left kidney. 5 mm stone inferior pole left kidney. There is mild right hydronephrosis. Urinary bladder is unremarkable. Stomach/Bowel: Mild nonspecific wall thickening of the second and third portion of the duodenum. No free intraperitoneal air. Normal morphology of the stomach. Vascular/Lymphatic: Normal caliber abdominal aorta. Peripheral calcified atherosclerotic plaque. No retroperitoneal lymphadenopathy. Reproductive: Central dystrophic calcifications in the prostate.  Other: Interval decrease in size of fluid collection within the right retroperitoneum measuring up to 6.3 x 4.5 cm inferiorly (image 63; series 2), previously 11.5 x 8.3 cm. This appears to communicate with the overlying skin within the right lower quadrant (image 66; series 2). Small bilateral fat containing inguinal hernias. Musculoskeletal: No aggressive or acute appearing osseous lesions. Lumbar spine degenerative changes. IMPRESSION: Interval decrease in size of right retroperitoneal fluid collection extending anterior to the ileus psoas muscle. This collection appears to communicate with the overlying skin within the right lower quadrant. Interval stent placement which appears to communicate between the gastric antrum, common bile duct and duodenum. There is marked wall thickening of the second and third portion of the duodenum which is nonspecific and may be infectious or inflammatory in etiology. There is fat stranding about the uncinate process and the duodenum. Associated pancreatitis should be considered. Persistent mild right hydronephrosis. This is likely secondary to inflammatory changes and fluid along the course of the right ureter. Aortic atherosclerosis. Splenomegaly. Small bilateral fat containing inguinal hernias. Electronically Signed   By: Lovey Newcomer M.D.   On: 04/19/2016 12:23    Procedures Procedures (including critical care time)  Medications Ordered in ED Medications  sodium chloride 0.9 % bolus 1,000 mL (0 mLs Intravenous Stopped 04/19/16 1212)  acetaminophen (TYLENOL) tablet 650 mg (650 mg Oral Given 04/19/16 1058)  fentaNYL (SUBLIMAZE) injection 50 mcg (50 mcg Intravenous Given 04/19/16 1059)  iopamidol (ISOVUE-300) 61 % injection 100 mL (100 mLs Intravenous Contrast Given 04/19/16 1133)     Initial Impression / Assessment and Plan / ED Course  I have reviewed the triage vital signs and the nursing notes.  Pertinent labs & imaging results that were available during my care  of the patient were reviewed by me and considered in my medical decision making (see chart for details).     Unsure of cause of his left hip pain or overall general malaise. Labs negative. Ct with interval decrease in size of fluid accumulation relative to previously. Some erythema to rlq in area of interest on CT scan but no other evidence of infection, will hold on antibiotics for now. Plan to follow up with GI and ortho as scheduled.   Final Clinical Impressions(s) / ED Diagnoses   Final diagnoses:  Left hip pain  Hematuria, unspecified type    New Prescriptions New Prescriptions   No medications on file   I personally performed the services described in this documentation, which was scribed in my presence. The recorded information has been reviewed and is accurate.       Sara Lee,  MD 04/20/16 1012

## 2016-04-20 ENCOUNTER — Encounter (HOSPITAL_COMMUNITY): Payer: Self-pay | Admitting: *Deleted

## 2016-04-20 DIAGNOSIS — E038 Other specified hypothyroidism: Secondary | ICD-10-CM | POA: Diagnosis not present

## 2016-04-20 DIAGNOSIS — K863 Pseudocyst of pancreas: Secondary | ICD-10-CM | POA: Diagnosis not present

## 2016-04-20 DIAGNOSIS — I4891 Unspecified atrial fibrillation: Secondary | ICD-10-CM | POA: Diagnosis present

## 2016-04-20 DIAGNOSIS — R188 Other ascites: Secondary | ICD-10-CM | POA: Diagnosis present

## 2016-04-20 DIAGNOSIS — Z980 Intestinal bypass and anastomosis status: Secondary | ICD-10-CM

## 2016-04-20 DIAGNOSIS — Z8719 Personal history of other diseases of the digestive system: Secondary | ICD-10-CM | POA: Diagnosis not present

## 2016-04-20 DIAGNOSIS — K8681 Exocrine pancreatic insufficiency: Secondary | ICD-10-CM | POA: Diagnosis present

## 2016-04-20 DIAGNOSIS — K861 Other chronic pancreatitis: Secondary | ICD-10-CM | POA: Diagnosis present

## 2016-04-20 DIAGNOSIS — Z79899 Other long term (current) drug therapy: Secondary | ICD-10-CM | POA: Diagnosis not present

## 2016-04-20 DIAGNOSIS — K6819 Other retroperitoneal abscess: Principal | ICD-10-CM

## 2016-04-20 DIAGNOSIS — L02211 Cutaneous abscess of abdominal wall: Secondary | ICD-10-CM | POA: Diagnosis present

## 2016-04-20 DIAGNOSIS — D696 Thrombocytopenia, unspecified: Secondary | ICD-10-CM | POA: Diagnosis present

## 2016-04-20 DIAGNOSIS — Z931 Gastrostomy status: Secondary | ICD-10-CM | POA: Diagnosis not present

## 2016-04-20 DIAGNOSIS — I480 Paroxysmal atrial fibrillation: Secondary | ICD-10-CM

## 2016-04-20 DIAGNOSIS — E039 Hypothyroidism, unspecified: Secondary | ICD-10-CM | POA: Diagnosis present

## 2016-04-20 DIAGNOSIS — M109 Gout, unspecified: Secondary | ICD-10-CM | POA: Diagnosis present

## 2016-04-20 DIAGNOSIS — M1990 Unspecified osteoarthritis, unspecified site: Secondary | ICD-10-CM | POA: Diagnosis present

## 2016-04-20 LAB — CBC
HEMATOCRIT: 28.3 % — AB (ref 39.0–52.0)
Hemoglobin: 9.3 g/dL — ABNORMAL LOW (ref 13.0–17.0)
MCH: 29.4 pg (ref 26.0–34.0)
MCHC: 32.9 g/dL (ref 30.0–36.0)
MCV: 89.6 fL (ref 78.0–100.0)
Platelets: 108 10*3/uL — ABNORMAL LOW (ref 150–400)
RBC: 3.16 MIL/uL — ABNORMAL LOW (ref 4.22–5.81)
RDW: 14.2 % (ref 11.5–15.5)
WBC: 7.9 10*3/uL (ref 4.0–10.5)

## 2016-04-20 LAB — PROTIME-INR
INR: 1.42
PROTHROMBIN TIME: 17.5 s — AB (ref 11.4–15.2)

## 2016-04-20 LAB — BASIC METABOLIC PANEL
ANION GAP: 7 (ref 5–15)
BUN: 16 mg/dL (ref 6–20)
CALCIUM: 8.3 mg/dL — AB (ref 8.9–10.3)
CO2: 22 mmol/L (ref 22–32)
Chloride: 103 mmol/L (ref 101–111)
Creatinine, Ser: 0.89 mg/dL (ref 0.61–1.24)
GFR calc Af Amer: >60 mL/min (ref >60)
Glucose, Bld: 92 mg/dL (ref 65–99)
POTASSIUM: 3.5 mmol/L (ref 3.5–5.1)
SODIUM: 132 mmol/L — AB (ref 135–145)

## 2016-04-20 LAB — LIPASE, BLOOD: Lipase: 24 U/L (ref 11–51)

## 2016-04-20 LAB — APTT: APTT: 45 s — AB (ref 24–36)

## 2016-04-20 MED ORDER — PIPERACILLIN-TAZOBACTAM 3.375 G IVPB
3.3750 g | Freq: Three times a day (TID) | INTRAVENOUS | Status: DC
  Administered 2016-04-20 – 2016-04-22 (×7): 3.375 g via INTRAVENOUS
  Filled 2016-04-20 (×7): qty 50

## 2016-04-20 MED ORDER — ONDANSETRON HCL 4 MG/2ML IJ SOLN: 4.0000 mg | Freq: Four times a day (QID) | INTRAMUSCULAR | Status: DC | PRN

## 2016-04-20 MED ORDER — SODIUM CHLORIDE 0.9 % IV SOLN
INTRAVENOUS | Status: AC
  Administered 2016-04-20: 03:00:00 via INTRAVENOUS

## 2016-04-20 MED ORDER — ONDANSETRON HCL 4 MG PO TABS: 4.0000 mg | ORAL_TABLET | Freq: Four times a day (QID) | ORAL | Status: DC | PRN

## 2016-04-20 MED ORDER — VANCOMYCIN HCL IN DEXTROSE 1-5 GM/200ML-% IV SOLN
1000.0000 mg | Freq: Two times a day (BID) | INTRAVENOUS | Status: DC
  Administered 2016-04-20 – 2016-04-22 (×5): 1000 mg via INTRAVENOUS
  Filled 2016-04-20 (×6): qty 200

## 2016-04-20 MED ORDER — HYDROCODONE-ACETAMINOPHEN 5-325 MG PO TABS: 1.0000 | ORAL_TABLET | ORAL | Status: DC | PRN

## 2016-04-20 NOTE — Progress Notes (Signed)
Patient admitted to the hospital earlier this morning by Dr. Shanon Brow  Patient seen and examined. He does not have any complaints at this time.   He has been admitted with a retroperitoneal fluid collection. He is currently on IV antibiotics. Plans are for IR to place CT guided drain tomorrow at [facility]. Will continue with current treatments.  MEMON,JEHANZEB

## 2016-04-20 NOTE — Consult Note (Signed)
Reason for Consult: Retroperitoneal fluid collection Referring Physician: Dr. Telford Nab Andrew Watson is an 76 y.o. male.  HPI: Patient is a 76 year old white male with a significant history of pancreatic cyst and pancreatitis that was treated with stent placement at [facility] several months ago. Multiple stones had been placed in the hepatobiliary tree. He apparently had a drain site in the right lower quadrant of the abdomen. 24 hours ago, he started having spontaneous clear yellow fluid draining from the right lower quadrant of his abdomen. He denies any nausea, vomiting, fever, or chills. He presented to the emergency room and was found on CT scan of the abdomen to have a large retroperitoneal fluid collection on the right side, though it was decreased from a previous scan. He was admitted to the hospital for further evaluation and treatment. This morning, he denies any significant abdominal pain. No nausea or vomiting have been noted. He has been nothing by mouth.  Dr. Laural Golden of GI has followed him in town for his GI issues. No diarrhea or constipation are noted.  Past Medical History:  Diagnosis Date  . Acute pancreatitis   . Arthritis   . Gout   . Thyroid disease     Past Surgical History:  Procedure Laterality Date  . BACK SURGERY    . COLONOSCOPY  07/08/2011   Procedure: COLONOSCOPY;  Surgeon: Rogene Houston, MD;  Location[facility];  Service: Endoscopy;  Laterality: N/A;  930  . JEJUNOSTOMY FEEDING TUBE    . KNEE ARTHROSCOPY    . LEFT HEART CATHETERIZATION WITH CORONARY ANGIOGRAM N/A 05/07/2011   Procedure: LEFT HEART CATHETERIZATION WITH CORONARY ANGIOGRAM;  Surgeon: Peter M Martinique, MD;  Location: [facility] CATH LAB;  Service: Cardiovascular;  Laterality: N/A;  . PEG TUBE PLACEMENT      History reviewed. No pertinent family history.  Social History:  reports that he has never smoked. He has never used smokeless tobacco. He reports that he does not drink alcohol or use  drugs.  Allergies: No Known Allergies  Medications:  Prior to Admission:  Prescriptions Prior to Admission  Medication Sig Dispense Refill Last Dose  . levothyroxine (SYNTHROID, LEVOTHROID) 175 MCG tablet Take 175 mcg by mouth daily before breakfast.   04/18/2016 at Unknown time  . ondansetron (ZOFRAN) 4 MG tablet Take 4 mg by mouth every 8 (eight) hours as needed.   unknown  . Pancrelipase, Lip-Prot-Amyl, 24000-76000 units CPEP Take 24,000-48,000 Units by mouth 3 (three) times daily.    04/18/2016 at Unknown time  . Simethicone (GAS-X PO) Take by mouth as needed.   unknown   Scheduled: . piperacillin-tazobactam (ZOSYN)  IV  3.375 g Intravenous Q8H  . vancomycin  1,000 mg Intravenous Q12H    Results for orders placed or performed during the hospital encounter of 04/19/16 (from the past 48 hour(s))  CBC with Differential/Platelet     Status: Abnormal   Collection Time: 04/19/16 10:58 PM  Result Value Ref Range   WBC 9.0 4.0 - 10.5 K/uL   RBC 3.35 (L) 4.22 - 5.81 MIL/uL   Hemoglobin 9.9 (L) 13.0 - 17.0 g/dL   HCT 29.9 (L) 39.0 - 52.0 %   MCV 89.3 78.0 - 100.0 fL   MCH 29.6 26.0 - 34.0 pg   MCHC 33.1 30.0 - 36.0 g/dL   RDW 14.1 11.5 - 15.5 %   Platelets 116 (L) 150 - 400 K/uL    Comment: SPECIMEN CHECKED FOR CLOTS CONSISTENT WITH PREVIOUS RESULT  Neutrophils Relative % 75 %   Neutro Abs 6.7 1.7 - 7.7 K/uL   Lymphocytes Relative 7 %   Lymphs Abs 0.6 (L) 0.7 - 4.0 K/uL   Monocytes Relative 18 %   Monocytes Absolute 1.7 (H) 0.1 - 1.0 K/uL   Eosinophils Relative 0 %   Eosinophils Absolute 0.0 0.0 - 0.7 K/uL   Basophils Relative 0 %   Basophils Absolute 0.0 0.0 - 0.1 K/uL  Comprehensive metabolic panel     Status: Abnormal   Collection Time: 04/19/16 10:58 PM  Result Value Ref Range   Sodium 133 (L) 135 - 145 mmol/L   Potassium 3.8 3.5 - 5.1 mmol/L   Chloride 101 101 - 111 mmol/L   CO2 26 22 - 32 mmol/L   Glucose, Bld 118 (H) 65 - 99 mg/dL   BUN 17 6 - 20 mg/dL    Creatinine, Ser 1.05 0.61 - 1.24 mg/dL   Calcium 8.6 (L) 8.9 - 10.3 mg/dL   Total Protein 6.5 6.5 - 8.1 g/dL   Albumin 2.9 (L) 3.5 - 5.0 g/dL   AST 18 15 - 41 U/L   ALT 14 (L) 17 - 63 U/L   Alkaline Phosphatase 46 38 - 126 U/L   Total Bilirubin 1.5 (H) 0.3 - 1.2 mg/dL   GFR calc non Af Amer >60 >60 mL/min   GFR calc Af Amer >60 >60 mL/min    Comment: (NOTE) The eGFR has been calculated using the CKD EPI equation. This calculation has not been validated in all clinical situations. eGFR's persistently <60 mL/min signify possible Chronic Kidney Disease.    Anion gap 6 5 - 15  Lipase, blood     Status: None   Collection Time: 04/19/16 10:58 PM  Result Value Ref Range   Lipase 24 11 - 51 U/L  Blood culture (routine x 2)     Status: None (Preliminary result)   Collection Time: 04/19/16 11:53 PM  Result Value Ref Range   Specimen Description BLOOD RIGHT ARM    Special Requests BOTTLES DRAWN AEROBIC AND ANAEROBIC 6CC    Culture PENDING    Report Status PENDING   Blood culture (routine x 2)     Status: None (Preliminary result)   Collection Time: 04/20/16 12:04 AM  Result Value Ref Range   Specimen Description BLOOD RIGHT HAND    Special Requests BOTTLES DRAWN AEROBIC AND ANAEROBIC 6CC    Culture PENDING    Report Status PENDING     Ct Abdomen Pelvis W Contrast  Result Date: 04/19/2016 CLINICAL DATA:  Patient with fever and indigestion.  Left hip pain. EXAM: CT ABDOMEN AND PELVIS WITH CONTRAST TECHNIQUE: Multidetector CT imaging of the abdomen and pelvis was performed using the standard protocol following bolus administration of intravenous contrast. CONTRAST:  149m ISOVUE-300 IOPAMIDOL (ISOVUE-300) INJECTION 61% COMPARISON:  MRI abdomen 07/27/2015 FINDINGS: Lower chest: Normal heart size. No pericardial effusion. Consolidative opacities within the right lower lobe. Small right pleural effusion. Hepatobiliary: Liver is normal in size and contour. Pneumobilia within the left hepatic  lobe. Gas within the gallbladder lumen. Stent is demonstrated coursing from the gastric antrum through the common bile duct into the duodenum. Pancreas: Mild stranding about the pancreatic head/ uncinate process. Spleen: Enlarged measuring 15.7 cm. Adrenals/Urinary Tract: Adrenal glands are normal. Multiple left-sided parapelvic cysts. Unchanged cyst interpolar region left kidney. 5 mm stone inferior pole left kidney. There is mild right hydronephrosis. Urinary bladder is unremarkable. Stomach/Bowel: Mild nonspecific wall thickening of the second  and third portion of the duodenum. No free intraperitoneal air. Normal morphology of the stomach. Vascular/Lymphatic: Normal caliber abdominal aorta. Peripheral calcified atherosclerotic plaque. No retroperitoneal lymphadenopathy. Reproductive: Central dystrophic calcifications in the prostate. Other: Interval decrease in size of fluid collection within the right retroperitoneum measuring up to 6.3 x 4.5 cm inferiorly (image 63; series 2), previously 11.5 x 8.3 cm. This appears to communicate with the overlying skin within the right lower quadrant (image 66; series 2). Small bilateral fat containing inguinal hernias. Musculoskeletal: No aggressive or acute appearing osseous lesions. Lumbar spine degenerative changes. IMPRESSION: Interval decrease in size of right retroperitoneal fluid collection extending anterior to the ileus psoas muscle. This collection appears to communicate with the overlying skin within the right lower quadrant. Interval stent placement which appears to communicate between the gastric antrum, common bile duct and duodenum. There is marked wall thickening of the second and third portion of the duodenum which is nonspecific and may be infectious or inflammatory in etiology. There is fat stranding about the uncinate process and the duodenum. Associated pancreatitis should be considered. Persistent mild right hydronephrosis. This is likely secondary to  inflammatory changes and fluid along the course of the right ureter. Aortic atherosclerosis. Splenomegaly. Small bilateral fat containing inguinal hernias. Electronically Signed   By: Lovey Newcomer M.D.   On: 04/19/2016 12:23    ROS:  Pertinent items noted in HPI and remainder of comprehensive ROS otherwise negative.  Blood pressure (!) 110/53, pulse 94, temperature 98.7 F (37.1 C), temperature source Oral, resp. rate 18, height 5' 11"  (1.803 m), weight 204 lb 5.9 oz (92.7 kg), SpO2 96 %. Physical Exam: Pleasant white male in no acute distress. Head is normocephalic, atraumatic Neck is supple without lymphadenopathy Lungs clear auscultation with equal breath sounds bilaterally. Heart exam reveals a regular rate and rhythm without S3, S4, murmurs Abdomen is soft, nontender, nondistended. In the right lower quadrant, a wound is present with mild erythema surrounding it. Mild induration is noted. This seems to extend to the right flank. No purulent drainage is noted. Clear yellow fluid was noted on the bandage. CT scan images personally reviewed Endoscopy notes from [facility] reviewed.  Assessment/Plan: Impression: Retroperitoneal fluid collection, history of pancreatitis and pseudocyst formation Plan: Will arrange for interventional radiologic drainage of the retroperitoneal fluid collection today. Management has been discussed with the patient, who agrees.  Aviva Signs 04/20/2016, 8:23 AM

## 2016-04-20 NOTE — Progress Notes (Signed)
Pharmacy Antibiotic Note  Andrew Watson is a 76 y.o. male admitted on 04/19/2016 with wound infection / intra abdominal infection.  Pharmacy has been consulted for [place] dosing.  Plan:  Vancomycin 1000mg  IV q12h Check trough at steady state Zosyn 3.375gm IV q8h, EID Monitor labs, renal fxn, progress and c/s Deescalate ABX when improved / appropriate.    Height: 5\' 11"  (180.3 cm) Weight: 204 lb 5.9 oz (92.7 kg) IBW/kg (Calculated) : 75.3  Temp (24hrs), Avg:99.3 F (37.4 C), Min:98.4 F (36.9 C), Max:100.2 F (37.9 C)   Recent Labs Lab 04/19/16 1026 04/19/16 2258 04/20/16 0803  WBC 8.4 9.0 7.9  CREATININE 0.95 1.05  --     Estimated Creatinine Clearance: 69.7 mL/min (by C-G formula based on SCr of 1.05 mg/dL).    No Known Allergies  Antimicrobials this admission: Vancomycin 3/26 >>  Zosyn 3/26 >>   Dose adjustments this admission:  Microbiology results:  BCx: pending  UCx: pending   Sputum:    MRSA PCR: pending  Thank you for allowing pharmacy to be a part of this patient's care.  Hart Robinsons A 04/20/2016 8:33 AM

## 2016-04-20 NOTE — Progress Notes (Addendum)
Patient ID: Andrew Watson, male   DOB: [birth year], 76 y.o.   MRN: [id number]   Known hx pancreatitis Known pancreatic cyst Was seen and treated in [place] yesterday to [facility] ED Spontaneous drain of yellow fluid from skin  CT 3/25: IMPRESSION: Interval decrease in size of right retroperitoneal fluid collection extending anterior to the ileus psoas muscle. This collection appears to communicate with the overlying skin within the right lower quadrant. Interval stent placement which appears to communicate between the gastric antrum, common bile duct and duodenum. There is marked wall thickening of the second and third portion of the duodenum which is nonspecific and may be infectious or inflammatory in etiology. There is fat stranding about the uncinate process and the duodenum. Associated pancreatitis should be considered. Persistent mild right hydronephrosis. This is likely secondary to inflammatory changes and fluid along the course of the right ureter. Aortic atherosclerosis. Splenomegaly. Small bilateral fat containing inguinal hernias.  Request for drain placement per Dr Mickeal Needy Reviewed with Dr Josue Hector procedure Will plan for 3/27 am  IR PA will place orders and talk with RN  Addendum: Pt to be at [facility] rad 1030 am via ambulance Will return to [facility] after procedure RN aware

## 2016-04-20 NOTE — Progress Notes (Signed)
Patient requested that his labs be drawn around 8AM because he doesn't want to be disturbed while he is sleeping.

## 2016-04-20 NOTE — H&P (Signed)
History and Physical    AHMARION SARACENO CBU:[id number] DOB: 1940-12-23 DOA: 04/19/2016  PCP: Wende Neighbors, MD  Patient coming from:  home  Chief Complaint:   Spontaneously pus draining from abdomen  HPI: MARTEL GALVAN is a 76 y.o. male with medical history significant of pancreatic cyst and pancreatitis that required a prolonged hospitalization 2- 2months ago at outside facility which included several "drains in his abdomen" 3 in total.  Pt reports these areas where his drains were have all healed well or so he thought.  He denies any rash or area of concern around his previous drain site to his right lower quadrant abdominal area.  Tonight he as lying in bed when all of a sudden something "popped" and a whole bunch of fluid drained out of his abdomen to the bed.  Again, he denies any concern to the area recently.  He has been having chronic hip pain, but no pain in this area.  He came to the ED, where the area is red and is sponaneously draining purulent material.  He denies any fevers at home,  No n/v/d.  Ct shows a retroperotenial absess which is draining to the rlq. Referred for admission for treatment.  Dr Arnoldo Morale also called who agreed to see pt here.   Review of Systems: As per HPI otherwise 10 point review of systems negative.   Past Medical History:  Diagnosis Date  . Acute pancreatitis   . Arthritis   . Gout   . Thyroid disease     Past Surgical History:  Procedure Laterality Date  . BACK SURGERY    . COLONOSCOPY  07/08/2011   Procedure: COLONOSCOPY;  Surgeon: Rogene Houston, MD;  [facility];  Service: Endoscopy;  Laterality: N/A;  930  . JEJUNOSTOMY FEEDING TUBE    . KNEE ARTHROSCOPY    . LEFT HEART CATHETERIZATION WITH CORONARY ANGIOGRAM N/A 05/07/2011   Procedure: LEFT HEART CATHETERIZATION WITH CORONARY ANGIOGRAM;  Surgeon: Peter M Martinique, MD;  Location: [facility] CATH LAB;  Service: Cardiovascular;  Laterality: N/A;  . PEG TUBE PLACEMENT       reports that he  has never smoked. He has never used smokeless tobacco. He reports that he does not drink alcohol or use drugs.  No Known Allergies  No family history on file. no preamture CAD  Prior to Admission medications   Medication Sig Start Date End Date Taking? Authorizing Provider  levothyroxine (SYNTHROID, LEVOTHROID) 175 MCG tablet Take 175 mcg by mouth daily before breakfast.    Historical Provider, MD  ondansetron (ZOFRAN) 4 MG tablet Take 4 mg by mouth every 8 (eight) hours as needed. 11/13/15   Historical Provider, MD  Pancrelipase, Lip-Prot-Amyl, 24000-76000 units CPEP Take 24,000-48,000 Units by mouth 3 (three) times daily.  11/08/15   Historical Provider, MD  Simethicone (GAS-X PO) Take by mouth as needed.    Historical Provider, MD    Physical Exam: Vitals:   04/19/16 2148 04/19/16 2304 04/20/16 0023 04/20/16 0113  BP:  (!) 107/58 (!) 97/36 (!) 100/57  Pulse:  100 98 98  Resp:  20 20 20   Temp:  100 F (37.8 C)    TempSrc:      SpO2:  95% 97% 99%  Weight: 90.7 kg (200 lb)     Height: 5\' 11"  (1.803 m)       Constitutional: NAD, calm, comfortable Vitals:   04/19/16 2148 04/19/16 2304 04/20/16 0023 04/20/16 0113  BP:  (!) 107/58 (!) 97/36 [name] Kitchen)  100/57  Pulse:  100 98 98  Resp:  20 20 20   Temp:  100 F (37.8 C)    TempSrc:      SpO2:  95% 97% 99%  Weight: 90.7 kg (200 lb)     Height: 5\' 11"  (1.803 m)      Eyes: PERRL, lids and conjunctivae normal ENMT: Mucous membranes are moist. Posterior pharynx clear of any exudate or lesions.Normal dentition.  Neck: normal, supple, no masses, no thyromegaly Respiratory: clear to auscultation bilaterally, no wheezing, no crackles. Normal respiratory effort. No accessory muscle use.  Cardiovascular: Regular rate and rhythm, no murmurs / rubs / gallops. No extremity edema. 2+ pedal pulses. No carotid bruits.  Abdomen: no tenderness, no masses palpated. No hepatosplenomegaly. Bowel sounds positive.  rlq with open wound draining pus and  erythematous area Musculoskeletal: no clubbing / cyanosis. No joint deformity upper and lower extremities. Good ROM, no contractures. Normal muscle tone.  Skin: no rashes, lesions, ulcers. No induration Neurologic: CN 2-12 grossly intact. Sensation intact, DTR normal. Strength 5/5 in all 4.  Psychiatric: Normal judgment and insight. Alert and oriented x 3. Normal mood.    Labs on Admission: I have personally reviewed following labs and imaging studies  CBC:  Recent Labs Lab 04/19/16 1026 04/19/16 2258  WBC 8.4 9.0  NEUTROABS 6.0 6.7  HGB 10.7* 9.9*  HCT 32.6* 29.9*  MCV 89.3 89.3  PLT 119* 646*   Basic Metabolic Panel:  Recent Labs Lab 04/19/16 1026 04/19/16 2258  NA 133* 133*  K 3.8 3.8  CL 102 101  CO2 24 26  GLUCOSE 107* 118*  BUN 18 17  CREATININE 0.95 1.05  CALCIUM 8.7* 8.6*   GFR: Estimated Creatinine Clearance: 69 mL/min (by C-G formula based on SCr of 1.05 mg/dL). Liver Function Tests:  Recent Labs Lab 04/19/16 1026 04/19/16 2258  AST 13* 18  ALT 11* 14*  ALKPHOS 44 46  BILITOT 1.8* 1.5*  PROT 7.0 6.5  ALBUMIN 3.1* 2.9*    Recent Labs Lab 04/19/16 1026 04/19/16 2258  LIPASE 29 24   Urine analysis:    Component Value Date/Time   COLORURINE YELLOW 04/19/2016 1026   APPEARANCEUR CLEAR 04/19/2016 1026   LABSPEC >1.046 (H) 04/19/2016 1026   PHURINE 5.0 04/19/2016 1026   GLUCOSEU NEGATIVE 04/19/2016 1026   HGBUR MODERATE (A) 04/19/2016 1026   BILIRUBINUR NEGATIVE 04/19/2016 1026   KETONESUR NEGATIVE 04/19/2016 1026   PROTEINUR NEGATIVE 04/19/2016 1026   NITRITE NEGATIVE 04/19/2016 1026   LEUKOCYTESUR NEGATIVE 04/19/2016 1026    Recent Results (from the past 240 hour(s))  Blood culture (routine x 2)     Status: None (Preliminary result)   Collection Time: 04/19/16 11:53 PM  Result Value Ref Range Status   Specimen Description BLOOD RIGHT ARM  Final   Special Requests BOTTLES DRAWN AEROBIC AND ANAEROBIC 6CC  Final   Culture PENDING   Incomplete   Report Status PENDING  Incomplete  Blood culture (routine x 2)     Status: None (Preliminary result)   Collection Time: 04/20/16 12:04 AM  Result Value Ref Range Status   Specimen Description BLOOD RIGHT HAND  Final   Special Requests BOTTLES DRAWN AEROBIC AND ANAEROBIC 6CC  Final   Culture PENDING  Incomplete   Report Status PENDING  Incomplete     Radiological Exams on Admission: Ct Abdomen Pelvis W Contrast  Result Date: 04/19/2016 CLINICAL DATA:  Patient with fever and indigestion.  Left hip pain. EXAM: CT ABDOMEN AND  PELVIS WITH CONTRAST TECHNIQUE: Multidetector CT imaging of the abdomen and pelvis was performed using the standard protocol following bolus administration of intravenous contrast. CONTRAST:  142mL ISOVUE-300 IOPAMIDOL (ISOVUE-300) INJECTION 61% COMPARISON:  MRI abdomen 07/27/2015 FINDINGS: Lower chest: Normal heart size. No pericardial effusion. Consolidative opacities within the right lower lobe. Small right pleural effusion. Hepatobiliary: Liver is normal in size and contour. Pneumobilia within the left hepatic lobe. Gas within the gallbladder lumen. Stent is demonstrated coursing from the gastric antrum through the common bile duct into the duodenum. Pancreas: Mild stranding about the pancreatic head/ uncinate process. Spleen: Enlarged measuring 15.7 cm. Adrenals/Urinary Tract: Adrenal glands are normal. Multiple left-sided parapelvic cysts. Unchanged cyst interpolar region left kidney. 5 mm stone inferior pole left kidney. There is mild right hydronephrosis. Urinary bladder is unremarkable. Stomach/Bowel: Mild nonspecific wall thickening of the second and third portion of the duodenum. No free intraperitoneal air. Normal morphology of the stomach. Vascular/Lymphatic: Normal caliber abdominal aorta. Peripheral calcified atherosclerotic plaque. No retroperitoneal lymphadenopathy. Reproductive: Central dystrophic calcifications in the prostate. Other: Interval decrease  in size of fluid collection within the right retroperitoneum measuring up to 6.3 x 4.5 cm inferiorly (image 63; series 2), previously 11.5 x 8.3 cm. This appears to communicate with the overlying skin within the right lower quadrant (image 66; series 2). Small bilateral fat containing inguinal hernias. Musculoskeletal: No aggressive or acute appearing osseous lesions. Lumbar spine degenerative changes. IMPRESSION: Interval decrease in size of right retroperitoneal fluid collection extending anterior to the ileus psoas muscle. This collection appears to communicate with the overlying skin within the right lower quadrant. Interval stent placement which appears to communicate between the gastric antrum, common bile duct and duodenum. There is marked wall thickening of the second and third portion of the duodenum which is nonspecific and may be infectious or inflammatory in etiology. There is fat stranding about the uncinate process and the duodenum. Associated pancreatitis should be considered. Persistent mild right hydronephrosis. This is likely secondary to inflammatory changes and fluid along the course of the right ureter. Aortic atherosclerosis. Splenomegaly. Small bilateral fat containing inguinal hernias. Electronically Signed   By: Lovey Newcomer M.D.   On: 04/19/2016 12:23    Assessment/Plan 76 yo male with recent acute pancreatitis complicated by ?pseudocyst that was drained at the end of December 2017 comes in with spontanous draining of a retroperotineal absess to rlq  Principal Problem:   Retroperitoneal abscess ([place])- fluid has been obtained and sent for culture.  Place on vanc and zosyn.  Pt nontoxic.  Consult general surgery.  May need IR to place drain.  Await surgical evaluation.  Will keep npo and hold anticoagulants for this reason.  Active Problems:   Atrial fibrillation (HCC)- rate controlled   H/O bypass gastrojejunostomy- noted   Pancreatic pseudocyst- noted   History of pancreatitis-  noted   Med rec incomplete and pending   DVT prophylaxis:  scds  Code Status:  full Family Communication:  none  Disposition Plan:  Per day team Consults called:  Dr Arnoldo Morale with general surgery Admission status:  admission   Kamillah Didonato A MD [facility]  If 7PM-7AM, please contact night-coverage [email or web address] Password [email or web address]  04/20/2016, 1:58 AM

## 2016-04-21 ENCOUNTER — Ambulatory Visit (HOSPITAL_COMMUNITY)
Admit: 2016-04-21 | Discharge: 2016-04-21 | Disposition: A | Discharge status: Home / Self Care | Payer: Medicare Other | Attending: General Surgery | Admitting: General Surgery

## 2016-04-21 ENCOUNTER — Encounter (HOSPITAL_COMMUNITY): Payer: Self-pay

## 2016-04-21 DIAGNOSIS — K6819 Other retroperitoneal abscess: Secondary | ICD-10-CM | POA: Diagnosis not present

## 2016-04-21 MED ORDER — MIDAZOLAM HCL 2 MG/2ML IJ SOLN
INTRAMUSCULAR | Status: AC | PRN
  Administered 2016-04-21 (×2): 1 mg via INTRAVENOUS

## 2016-04-21 MED ORDER — LIDOCAINE HCL 1 % IJ SOLN
INTRAMUSCULAR | Status: AC
  Filled 2016-04-21: qty 20

## 2016-04-21 MED ORDER — FENTANYL CITRATE (PF) 100 MCG/2ML IJ SOLN
INTRAMUSCULAR | Status: AC | PRN
  Administered 2016-04-21: 50 ug via INTRAVENOUS
  Administered 2016-04-21: 25 ug via INTRAVENOUS

## 2016-04-21 MED ORDER — MIDAZOLAM HCL 2 MG/2ML IJ SOLN
INTRAMUSCULAR | Status: AC
  Filled 2016-04-21: qty 2

## 2016-04-21 MED ORDER — FENTANYL CITRATE (PF) 100 MCG/2ML IJ SOLN
INTRAMUSCULAR | Status: AC
  Filled 2016-04-21: qty 2

## 2016-04-21 NOTE — Progress Notes (Signed)
Report given to Georga Hacking at [facility]

## 2016-04-21 NOTE — Sedation Documentation (Signed)
Patient denies pain and is resting comfortably.  

## 2016-04-21 NOTE — Progress Notes (Signed)
PROGRESS NOTE    Andrew Watson  YDX:[id number] DOB: October 16, 1940 DOA: 04/19/2016 PCP: Wende Neighbors, MD    Brief Narrative:  76 year old male with history of chronic pancreatitis and pseudocyst, presents to the hospital with pus draining from his abdomen. CT scan of the abdomen and pelvis indicated retroperitoneal fluid collection, possibly abscess. He is started on intravenous antibiotics and will undergo CT-guided drain placement with interventional radiology today. Anticipate discharge home soon. General surgery is following.   Assessment & Plan:   Principal Problem:   Retroperitoneal fluid collection Active Problems:   Hypothyroidism   H/O bypass gastrojejunostomy   Pancreatic pseudocyst   History of pancreatitis   Retroperitoneal fluid collection, possibly abscess. Fluid has been sent for culture. He is on intravenous antibiotics. He will undergo CT-guided drain placement with interventional radiology today. General surgery is following.  2. History of pancreatitis. No abdominal pain at this time.  3. History of pancreatic pseudocyst. He has been seen at [facility] past.  4. Hypothyroidism. Stable  5. Thrombocytopenia. Possibly related to infection. No signs of bleeding at this time. Continue to monitor.   DVT prophylaxis: scd Code Status: full code Family Communication: discussed with girlfriend at bedside Disposition Plan: discharge home once improved.   Consultants:   General surgery  Procedures:   CT guided drain placement on 3/27  Antimicrobials:   Vancomycin 3/26>>  Zosyn 3/26>>   Subjective: Abdominal pain improving. No rash or vomiting.  Objective: Vitals:   04/20/16 1408 04/20/16 2145 04/21/16 0704 04/21/16 0928  BP: (!) 113/57 (!) 97/56 (!) 101/56 103/62  Pulse: 88 79 79 82  Resp: 18 18 18 16   Temp: 97.7 F (36.5 C) 98.3 F (36.8 C) 98.1 F (36.7 C) 98.4 F (36.9 C)  TempSrc: Oral Oral Oral Oral  SpO2: 100% 99% 99% 98%  Weight:        Height:        Intake/Output Summary (Last 24 hours) at 04/21/16 1039 Last data filed at 04/21/16 0945  Gross per 24 hour  Intake              550 ml  Output              700 ml  Net             -150 ml   Filed Weights   04/19/16 2148 04/20/16 0244  Weight: 90.7 kg (200 lb) 92.7 kg (204 lb 5.9 oz)    Examination:  General exam: Appears calm and comfortable  Respiratory system: Clear to auscultation. Respiratory effort normal. Cardiovascular system: S1 & S2 heard, RRR. No JVD, murmurs, rubs, gallops or clicks. No pedal edema. Gastrointestinal system: Abdomen is nondistended, soft and nontender. No organomegaly or masses felt. Normal bowel sounds heard. Central nervous system: Alert and oriented. No focal neurological deficits. Extremities: Symmetric 5 x 5 power. Skin: No rashes, lesions or ulcers Psychiatry: Judgement and insight appear normal. Mood & affect appropriate.     Data Reviewed: I have personally reviewed following labs and imaging studies  CBC:  Recent Labs Lab 04/19/16 1026 04/19/16 2258 04/20/16 0803  WBC 8.4 9.0 7.9  NEUTROABS 6.0 6.7  --   HGB 10.7* 9.9* 9.3*  HCT 32.6* 29.9* 28.3*  MCV 89.3 89.3 89.6  PLT 119* 116* 720*   Basic Metabolic Panel:  Recent Labs Lab 04/19/16 1026 04/19/16 2258 04/20/16 0803  NA 133* 133* 132*  K 3.8 3.8 3.5  CL 102 101 103  CO2 24 26  22  GLUCOSE 107* 118* 92  BUN 18 17 16   CREATININE 0.95 1.05 0.89  CALCIUM 8.7* 8.6* 8.3*   GFR: Estimated Creatinine Clearance: 82.2 mL/min (by C-G formula based on SCr of 0.89 mg/dL). Liver Function Tests:  Recent Labs Lab 04/19/16 1026 04/19/16 2258  AST 13* 18  ALT 11* 14*  ALKPHOS 44 46  BILITOT 1.8* 1.5*  PROT 7.0 6.5  ALBUMIN 3.1* 2.9*    Recent Labs Lab 04/19/16 1026 04/19/16 2258  LIPASE 29 24   No results for input(s): AMMONIA in the last 168 hours. Coagulation Profile:  Recent Labs Lab 04/20/16 1108  INR 1.42   Cardiac Enzymes: No results  for input(s): CKTOTAL, CKMB, CKMBINDEX, TROPONINI in the last 168 hours. BNP (last 3 results) No results for input(s): PROBNP in the last 8760 hours. HbA1C: No results for input(s): HGBA1C in the last 72 hours. CBG: No results for input(s): GLUCAP in the last 168 hours. Lipid Profile: No results for input(s): CHOL, HDL, LDLCALC, TRIG, CHOLHDL, LDLDIRECT in the last 72 hours. Thyroid Function Tests: No results for input(s): TSH, T4TOTAL, FREET4, T3FREE, THYROIDAB in the last 72 hours. Anemia Panel: No results for input(s): VITAMINB12, FOLATE, FERRITIN, TIBC, IRON, RETICCTPCT in the last 72 hours. Sepsis Labs: No results for input(s): PROCALCITON, LATICACIDVEN in the last 168 hours.  Recent Results (from the past 240 hour(s))  Blood culture (routine x 2)     Status: None (Preliminary result)   Collection Time: 04/19/16 11:53 PM  Result Value Ref Range Status   Specimen Description BLOOD RIGHT ARM  Final   Special Requests BOTTLES DRAWN AEROBIC AND ANAEROBIC 6CC  Final   Culture NO GROWTH < 12 HOURS  Final   Report Status PENDING  Incomplete  Blood culture (routine x 2)     Status: None (Preliminary result)   Collection Time: 04/20/16 12:03 AM  Result Value Ref Range Status   Specimen Description BLOOD RIGHT HAND  Final   Special Requests BOTTLES DRAWN AEROBIC AND ANAEROBIC 6CC  Final   Culture NO GROWTH < 12 HOURS  Final   Report Status PENDING  Incomplete  Aerobic Culture (superficial specimen)     Status: None (Preliminary result)   Collection Time: 04/20/16  1:38 PM  Result Value Ref Range Status   Specimen Description WOUND  Final   Special Requests ABSCESS  Final   Gram Stain   Final    MODERATE WBC PRESENT, PREDOMINANTLY PMN FEW GRAM POSITIVE COCCI IN PAIRS FEW GRAM NEGATIVE RODS Performed at [facility], [place] [address] [place], [place] [address]    Culture PENDING  Incomplete   Report Status PENDING  Incomplete         Radiology Studies: Ct Abdomen  Pelvis W Contrast  Result Date: 04/19/2016 CLINICAL DATA:  Patient with fever and indigestion.  Left hip pain. EXAM: CT ABDOMEN AND PELVIS WITH CONTRAST TECHNIQUE: Multidetector CT imaging of the abdomen and pelvis was performed using the standard protocol following bolus administration of intravenous contrast. CONTRAST:  186mL ISOVUE-300 IOPAMIDOL (ISOVUE-300) INJECTION 61% COMPARISON:  MRI abdomen 07/27/2015 FINDINGS: Lower chest: Normal heart size. No pericardial effusion. Consolidative opacities within the right lower lobe. Small right pleural effusion. Hepatobiliary: Liver is normal in size and contour. Pneumobilia within the left hepatic lobe. Gas within the gallbladder lumen. Stent is demonstrated coursing from the gastric antrum through the common bile duct into the duodenum. Pancreas: Mild stranding about the pancreatic head/ uncinate process. Spleen: Enlarged measuring 15.7  cm. Adrenals/Urinary Tract: Adrenal glands are normal. Multiple left-sided parapelvic cysts. Unchanged cyst interpolar region left kidney. 5 mm stone inferior pole left kidney. There is mild right hydronephrosis. Urinary bladder is unremarkable. Stomach/Bowel: Mild nonspecific wall thickening of the second and third portion of the duodenum. No free intraperitoneal air. Normal morphology of the stomach. Vascular/Lymphatic: Normal caliber abdominal aorta. Peripheral calcified atherosclerotic plaque. No retroperitoneal lymphadenopathy. Reproductive: Central dystrophic calcifications in the prostate. Other: Interval decrease in size of fluid collection within the right retroperitoneum measuring up to 6.3 x 4.5 cm inferiorly (image 63; series 2), previously 11.5 x 8.3 cm. This appears to communicate with the overlying skin within the right lower quadrant (image 66; series 2). Small bilateral fat containing inguinal hernias. Musculoskeletal: No aggressive or acute appearing osseous lesions. Lumbar spine degenerative changes. IMPRESSION:  Interval decrease in size of right retroperitoneal fluid collection extending anterior to the ileus psoas muscle. This collection appears to communicate with the overlying skin within the right lower quadrant. Interval stent placement which appears to communicate between the gastric antrum, common bile duct and duodenum. There is marked wall thickening of the second and third portion of the duodenum which is nonspecific and may be infectious or inflammatory in etiology. There is fat stranding about the uncinate process and the duodenum. Associated pancreatitis should be considered. Persistent mild right hydronephrosis. This is likely secondary to inflammatory changes and fluid along the course of the right ureter. Aortic atherosclerosis. Splenomegaly. Small bilateral fat containing inguinal hernias. Electronically Signed   By: Lovey Newcomer M.D.   On: 04/19/2016 12:23        Scheduled Meds: . piperacillin-tazobactam (ZOSYN)  IV  3.375 g Intravenous Q8H  . vancomycin  1,000 mg Intravenous Q12H   Continuous Infusions:   LOS: 1 day    Time spent: 73mins    MEMON,JEHANZEB, MD [facility] Pager [phone]  If 7PM-7AM, please contact night-coverage [email or web address] Password [facility] 04/21/2016, 10:39 AM

## 2016-04-21 NOTE — Sedation Documentation (Signed)
Patient is resting comfortably. 

## 2016-04-21 NOTE — Plan of Care (Signed)
[facility] arrived and picked up patient.

## 2016-04-21 NOTE — Consult Note (Signed)
Chief Complaint: Patient was seen in consultation today for  Right abdominal abscess drain placement at the request of Jenkins,Mark  Referring Physician(s): Aviva Signs  Supervising Physician: Sandi Mariscal  Patient Status: [facility] - In-pt  History of Present Illness: ADONNIS SALCEDA is a 76 y.o. male   Hx pancreatitis  Hx pancreatic cyst Was treated at [facility] for stent placement months ago Had post drains placed all removed before Dec 2017 Was doing well and at home -- when developed spontaneous yellow drainage from RLQ 3/25. Was admitted to [facility] CT 3/25: IMPRESSION: Interval decrease in size of right retroperitoneal fluid collection extending anterior to the ileus psoas muscle. This collection appears to communicate with the overlying skin within the right lower quadrant. Interval stent placement which appears to communicate between the gastric antrum, common bile duct and duodenum. There is marked wall thickening of the second and third portion of the duodenum which is nonspecific and may be infectious or inflammatory in etiology. There is fat stranding about the uncinate process and the duodenum. Associated pancreatitis should be considered. Persistent mild right hydronephrosis. This is likely secondary to inflammatory changes and fluid along the course of the right ureter. Aortic atherosclerosis. Splenomegaly. Small bilateral fat containing inguinal hernias.  Request for abdominal fluid collection drain placement per Dr Mickeal Needy Approved by Dr Barbie Banner Now scheduled for same   Past Medical History:  Diagnosis Date  . Acute pancreatitis   . Arthritis   . Gout   . Thyroid disease     Past Surgical History:  Procedure Laterality Date  . BACK SURGERY    . COLONOSCOPY  07/08/2011   Procedure: COLONOSCOPY;  Surgeon: Rogene Houston, MD;  Location: [facility] SUITE;  Service: Endoscopy;  Laterality: N/A;  930  . JEJUNOSTOMY FEEDING TUBE    . KNEE ARTHROSCOPY     . LEFT HEART CATHETERIZATION WITH CORONARY ANGIOGRAM N/A 05/07/2011   Procedure: LEFT HEART CATHETERIZATION WITH CORONARY ANGIOGRAM;  Surgeon: Peter M Martinique, MD;  Location: [facility] CATH LAB;  Service: Cardiovascular;  Laterality: N/A;  . PEG TUBE PLACEMENT      Allergies: Patient has no known allergies.  Medications: Prior to Admission medications   Medication Sig Start Date End Date Taking? Authorizing Provider  celecoxib (CELEBREX) 100 MG capsule Take 100 mg by mouth daily.    Historical Provider, MD  levothyroxine (SYNTHROID, LEVOTHROID) 175 MCG tablet Take 175 mcg by mouth daily before breakfast.    Historical Provider, MD  ondansetron (ZOFRAN) 4 MG tablet Take 4 mg by mouth every 8 (eight) hours as needed. 11/13/15   Historical Provider, MD  Pancrelipase, Lip-Prot-Amyl, 24000-76000 units CPEP Take 24,000-48,000 Units by mouth 3 (three) times daily. 2 with meals and 1 with snacks 11/08/15   Historical Provider, MD  Simethicone (GAS-X PO) Take 1 tablet by mouth daily as needed (for flatulence).     Historical Provider, MD  traZODone (DESYREL) 50 MG tablet Take 50 mg by mouth at bedtime.    Historical Provider, MD     No family history on file.  Social History   Social History  . Marital status: Widowed    Spouse name: N/A  . Number of children: N/A  . Years of education: N/A   Social History Main Topics  . Smoking status: Never Smoker  . Smokeless tobacco: Never Used  . Alcohol use No     Comment: none since May 31st  . Drug use: No  . Sexual activity: Not  on file   Other Topics Concern  . Not on file   Social History Narrative  . No narrative on file     Review of Systems: A 12 point ROS discussed and pertinent positives are indicated in the HPI above.  All other systems are negative.  Review of Systems  Constitutional: Positive for activity change. Negative for appetite change, fatigue and fever.  Gastrointestinal: Positive for abdominal pain and nausea.    Neurological: Negative for weakness.  Psychiatric/Behavioral: Negative for behavioral problems and confusion.    Vital Signs: BP 117/69   Pulse 74   Resp 18   SpO2 100%   Physical Exam  Constitutional: He is oriented to person, place, and time.  Cardiovascular: Normal rate and regular rhythm.   Pulmonary/Chest: Effort normal and breath sounds normal.  Abdominal: Soft. There is no tenderness.  Musculoskeletal: Normal range of motion.  Neurological: He is alert and oriented to person, place, and time.  Skin: Skin is warm and dry.  Psychiatric: He has a normal mood and affect. His behavior is normal. Judgment and thought content normal.  Nursing note and vitals reviewed.   Mallampati Score:  MD Evaluation Airway: WNL Heart: WNL Abdomen: WNL Chest/ Lungs: WNL ASA  Classification: 3 Mallampati/Airway Score: Two  Imaging: Ct Abdomen Pelvis W Contrast  Result Date: 04/19/2016 CLINICAL DATA:  Patient with fever and indigestion.  Left hip pain. EXAM: CT ABDOMEN AND PELVIS WITH CONTRAST TECHNIQUE: Multidetector CT imaging of the abdomen and pelvis was performed using the standard protocol following bolus administration of intravenous contrast. CONTRAST:  120mL ISOVUE-300 IOPAMIDOL (ISOVUE-300) INJECTION 61% COMPARISON:  MRI abdomen 07/27/2015 FINDINGS: Lower chest: Normal heart size. No pericardial effusion. Consolidative opacities within the right lower lobe. Small right pleural effusion. Hepatobiliary: Liver is normal in size and contour. Pneumobilia within the left hepatic lobe. Gas within the gallbladder lumen. Stent is demonstrated coursing from the gastric antrum through the common bile duct into the duodenum. Pancreas: Mild stranding about the pancreatic head/ uncinate process. Spleen: Enlarged measuring 15.7 cm. Adrenals/Urinary Tract: Adrenal glands are normal. Multiple left-sided parapelvic cysts. Unchanged cyst interpolar region left kidney. 5 mm stone inferior pole left kidney.  There is mild right hydronephrosis. Urinary bladder is unremarkable. Stomach/Bowel: Mild nonspecific wall thickening of the second and third portion of the duodenum. No free intraperitoneal air. Normal morphology of the stomach. Vascular/Lymphatic: Normal caliber abdominal aorta. Peripheral calcified atherosclerotic plaque. No retroperitoneal lymphadenopathy. Reproductive: Central dystrophic calcifications in the prostate. Other: Interval decrease in size of fluid collection within the right retroperitoneum measuring up to 6.3 x 4.5 cm inferiorly (image 63; series 2), previously 11.5 x 8.3 cm. This appears to communicate with the overlying skin within the right lower quadrant (image 66; series 2). Small bilateral fat containing inguinal hernias. Musculoskeletal: No aggressive or acute appearing osseous lesions. Lumbar spine degenerative changes. IMPRESSION: Interval decrease in size of right retroperitoneal fluid collection extending anterior to the ileus psoas muscle. This collection appears to communicate with the overlying skin within the right lower quadrant. Interval stent placement which appears to communicate between the gastric antrum, common bile duct and duodenum. There is marked wall thickening of the second and third portion of the duodenum which is nonspecific and may be infectious or inflammatory in etiology. There is fat stranding about the uncinate process and the duodenum. Associated pancreatitis should be considered. Persistent mild right hydronephrosis. This is likely secondary to inflammatory changes and fluid along the course of the right ureter. Aortic atherosclerosis.  Splenomegaly. Small bilateral fat containing inguinal hernias. Electronically Signed   By: Lovey Newcomer M.D.   On: 04/19/2016 12:23    Labs:  CBC:  Recent Labs  11/26/15 0506 04/19/16 1026 04/19/16 2258 04/20/16 0803  WBC 4.9 8.4 9.0 7.9  HGB 8.7* 10.7* 9.9* 9.3*  HCT 28.1* 32.6* 29.9* 28.3*  PLT 153 119* 116* 108*     COAGS:  Recent Labs  04/20/16 1108  INR 1.42  APTT 45*    BMP:  Recent Labs  11/26/15 0506 04/19/16 1026 04/19/16 2258 04/20/16 0803  NA 135 133* 133* 132*  K 4.0 3.8 3.8 3.5  CL 105 102 101 103  CO2 25 24 26 22   GLUCOSE 88 107* 118* 92  BUN 12 18 17 16   CALCIUM 8.5* 8.7* 8.6* 8.3*  CREATININE 0.68 0.95 1.05 0.89  GFRNONAA >60 >60 >60 >60  GFRAA >60 >60 >60 >60    LIVER FUNCTION TESTS:  Recent Labs  11/24/15 1402 11/26/15 0506 04/19/16 1026 04/19/16 2258  BILITOT 0.8 0.8 1.8* 1.5*  AST 19 22 13* 18  ALT 35 27 11* 14*  ALKPHOS 82 61 44 46  PROT 6.9 5.7* 7.0 6.5  ALBUMIN 3.0* 2.4* 3.1* 2.9*    TUMOR MARKERS:  Recent Labs  07/09/15 0855  CA199 10    Assessment and Plan:  Hx pancreatitis Pancreatic cyst; surgery with stent placement at [facility] Did well for months Now spontaneous drainage from RLQ New abscess seen on CT Scheduled for drain placement Risks and Benefits discussed with the patient including bleeding, infection, damage to adjacent structures, bowel perforation/fistula connection, and sepsis. All of the patient's questions were answered, patient is agreeable to proceed. Consent signed and in chart.   Thank you for this interesting consult.  I greatly enjoyed meeting KRIS NO and look forward to participating in their care.  A copy of this report was sent to the requesting provider on this date.  Electronically Signed: Monia Sabal A 04/21/2016, 11:10 AM   I spent a total of 40 Minutes    in face to face in clinical consultation, greater than 50% of which was counseling/coordinating care for abdominal abscess drain placement

## 2016-04-21 NOTE — Procedures (Signed)
Pre procedural Dx: Abdominal fluid collectoin Post procedural Dx: Same  Technically successful CT guided placed of a 10 Fr drainage catheter placement into the right RP space yielding 90 cc of dark yellow fluid.    All aspirated samples sent to the laboratory for analysis.    EBL: Minimal  Complications: None immediate  Ronny Bacon, MD Pager #: [phone]

## 2016-04-21 NOTE — Plan of Care (Signed)
Patient IV is patent. RN assessment and VS reveal stability for transport.  Report called to Parks Ranger, RN with [facility]. Should be arriving in about 30 min. CHG X2 complete. NPO since midnight. RN told that procedure consent will be completed at [facility].

## 2016-04-21 NOTE — Sedation Documentation (Addendum)
Patient denies pain and is resting comfortably.  

## 2016-04-22 DIAGNOSIS — R188 Other ascites: Secondary | ICD-10-CM

## 2016-04-22 DIAGNOSIS — Z8719 Personal history of other diseases of the digestive system: Secondary | ICD-10-CM

## 2016-04-22 DIAGNOSIS — E038 Other specified hypothyroidism: Secondary | ICD-10-CM

## 2016-04-22 LAB — BASIC METABOLIC PANEL
Anion gap: 7 (ref 5–15)
BUN: 13 mg/dL (ref 6–20)
CHLORIDE: 105 mmol/L (ref 101–111)
CO2: 26 mmol/L (ref 22–32)
CREATININE: 0.89 mg/dL (ref 0.61–1.24)
Calcium: 8.7 mg/dL — ABNORMAL LOW (ref 8.9–10.3)
GFR calc Af Amer: >60 mL/min (ref >60)
GFR calc non Af Amer: >60 mL/min (ref >60)
Glucose, Bld: 102 mg/dL — ABNORMAL HIGH (ref 65–99)
Potassium: 3.7 mmol/L (ref 3.5–5.1)
SODIUM: 138 mmol/L (ref 135–145)

## 2016-04-22 LAB — CBC
HCT: 30.5 % — ABNORMAL LOW (ref 39.0–52.0)
Hemoglobin: 9.8 g/dL — ABNORMAL LOW (ref 13.0–17.0)
MCH: 28.6 pg (ref 26.0–34.0)
MCHC: 32.1 g/dL (ref 30.0–36.0)
MCV: 88.9 fL (ref 78.0–100.0)
PLATELETS: 148 10*3/uL — AB (ref 150–400)
RBC: 3.43 MIL/uL — AB (ref 4.22–5.81)
RDW: 13.9 % (ref 11.5–15.5)
WBC: 4.7 10*3/uL (ref 4.0–10.5)

## 2016-04-22 LAB — AEROBIC CULTURE W GRAM STAIN (SUPERFICIAL SPECIMEN)

## 2016-04-22 LAB — AMYLASE, PLEURAL OR PERITONEAL FLUID: AMYLASE FL: 1201 U/L

## 2016-04-22 LAB — AEROBIC CULTURE  (SUPERFICIAL SPECIMEN)

## 2016-04-22 MED ORDER — FLUCONAZOLE 100 MG PO TABS: 400.0000 mg | ORAL_TABLET | Freq: Every day | ORAL | Status: DC

## 2016-04-22 MED ORDER — FLUCONAZOLE 100 MG PO TABS
800.0000 mg | ORAL_TABLET | Freq: Once | ORAL | Status: AC
  Administered 2016-04-22: 800 mg via ORAL
  Filled 2016-04-22: qty 8

## 2016-04-22 MED ORDER — FLUCONAZOLE 200 MG PO TABS: 400.0000 mg | ORAL_TABLET | Freq: Every day | ORAL | 0 refills | Status: AC

## 2016-04-22 MED ORDER — AMOXICILLIN-POT CLAVULANATE 875-125 MG PO TABS: 1.0000 | ORAL_TABLET | Freq: Two times a day (BID) | ORAL | 0 refills | Status: AC

## 2016-04-22 NOTE — Discharge Summary (Signed)
Physician Discharge Summary  Andrew Watson TSV:[id number] DOB: 10-06-1940 DOA: 04/19/2016  PCP: Wende Neighbors, MD  Admit date: 04/19/2016 Discharge date: 04/22/2016  Time spent: 30  minutes  Recommendations for Outpatient Follow-up:  1. Follow up with drain CLINIC.  2. Please follow up with PCP in one week.  3. Please follow up with Home Health.    Discharge Diagnoses:  Principal Problem:   Retroperitoneal fluid collection Active Problems:   Hypothyroidism   H/O bypass gastrojejunostomy   Pancreatic pseudocyst   History of pancreatitis   Discharge Condition: stable.   Diet recommendation: regular.   Filed Weights   04/19/16 2148 04/20/16 0244  Weight: 90.7 kg (200 lb) 92.7 kg (204 lb 5.9 oz)    History of present illness:  76 year old male with history of chronic pancreatitis and pseudocyst, presents to the hospital with pus draining from his abdomen. CT scan of the abdomen and pelvis indicated retroperitoneal fluid collection, possibly abscess. He is started on intravenous antibiotics and underwent CT-guided drain placement with interventional radiology , will need 2 weeks of IV antibiotics and follow up with PCP in one week and drain clinic as recommended.   Hospital Course:  Retroperitoneal fluid collection, possibly abscess. Cultures sent and he was started on  Intravenous antibiotics. underwent CT-guided drain placement with interventional radiology , general surgery recommendations given. Will need 2 weeks of antibiotics on discharge.   2. History of pancreatitis. No abdominal pain at this time.  3. History of pancreatic pseudocyst. He has been [facility] at [facility] past.  4. Hypothyroidism. Stable  5. Thrombocytopenia. Possibly related to infection. No signs of bleeding at this time. Continue to monitor.  Procedures: CT-guided drain placement with interventional radiology  Consultations:  surgery   IR  Discharge Exam: Vitals:   04/21/16 2011 04/22/16  0410  BP: 118/63 134/74  Pulse: 76 72  Resp: 16 18  Temp: 97.9 F (36.6 C) 97.8 F (36.6 C)    General: alert and comfortable.  Cardiovascular: no new complaints.  Respiratory: s1s2, clear.   Discharge Instructions   Discharge Instructions    Diet - low sodium heart healthy    Complete by:  As directed    Discharge instructions    Complete by:  As directed    [facility] PCP in one week.     Current Discharge Medication List    START taking these medications   Details  amoxicillin-clavulanate (AUGMENTIN) 875-125 MG tablet Take 1 tablet by mouth 2 (two) times daily. Qty: 26 tablet, Refills: 0    fluconazole (DIFLUCAN) 200 MG tablet Take 2 tablets (400 mg total) by mouth daily. Qty: 18 tablet, Refills: 0      CONTINUE these medications which have NOT CHANGED   Details  levothyroxine (SYNTHROID, LEVOTHROID) 175 MCG tablet Take 175 mcg by mouth daily before breakfast.    Pancrelipase, Lip-Prot-Amyl, 24000-76000 units CPEP Take 24,000-48,000 Units by mouth 3 (three) times daily. 2 with meals and 1 with snacks    Simethicone (GAS-X PO) Take 1 tablet by mouth daily as needed (for flatulence).     traZODone (DESYREL) 50 MG tablet Take 50 mg by mouth at bedtime.    ondansetron (ZOFRAN) 4 MG tablet Take 4 mg by mouth every 8 (eight) hours as needed.      STOP taking these medications     celecoxib (CELEBREX) 100 MG capsule        No Known Allergies Follow-up Information    Wende Neighbors, MD  Follow up.   Specialty:  Internal Medicine Contact information: [place] [address] [phone]        [facility] Follow up.   Why:  Home health RN Contact information: [address] [place] [phone]           The results of significant diagnostics from this hospitalization (including imaging, microbiology, ancillary and laboratory) are listed below for reference.    Significant Diagnostic  Studies: Ct Abdomen Pelvis W Contrast  Result Date: 04/19/2016 CLINICAL DATA:  Patient with fever and indigestion.  Left hip pain. EXAM: CT ABDOMEN AND PELVIS WITH CONTRAST TECHNIQUE: Multidetector CT imaging of the abdomen and pelvis was performed using the standard protocol following bolus administration of intravenous contrast. CONTRAST:  140mL ISOVUE-300 IOPAMIDOL (ISOVUE-300) INJECTION 61% COMPARISON:  MRI abdomen 07/27/2015 FINDINGS: Lower chest: Normal heart size. No pericardial effusion. Consolidative opacities within the right lower lobe. Small right pleural effusion. Hepatobiliary: Liver is normal in size and contour. Pneumobilia within the left hepatic lobe. Gas within the gallbladder lumen. Stent is demonstrated coursing from the gastric antrum through the common bile duct into the duodenum. Pancreas: Mild stranding about the pancreatic head/ uncinate process. Spleen: Enlarged measuring 15.7 cm. Adrenals/Urinary Tract: Adrenal glands are normal. Multiple left-sided parapelvic cysts. Unchanged cyst interpolar region left kidney. 5 mm stone inferior pole left kidney. There is mild right hydronephrosis. Urinary bladder is unremarkable. Stomach/Bowel: Mild nonspecific wall thickening of the second and third portion of the duodenum. No free intraperitoneal air. Normal morphology of the stomach. Vascular/Lymphatic: Normal caliber abdominal aorta. Peripheral calcified atherosclerotic plaque. No retroperitoneal lymphadenopathy. Reproductive: Central dystrophic calcifications in the prostate. Other: Interval decrease in size of fluid collection within the right retroperitoneum measuring up to 6.3 x 4.5 cm inferiorly (image 63; series 2), previously 11.5 x 8.3 cm. This appears to communicate with the overlying skin within the right lower quadrant (image 66; series 2). Small bilateral fat containing inguinal hernias. Musculoskeletal: No aggressive or acute appearing osseous lesions. Lumbar spine degenerative  changes. IMPRESSION: Interval decrease in size of right retroperitoneal fluid collection extending anterior to the ileus psoas muscle. This collection appears to communicate with the overlying skin within the right lower quadrant. Interval stent placement which appears to communicate between the gastric antrum, common bile duct and duodenum. There is marked wall thickening of the second and third portion of the duodenum which is nonspecific and may be infectious or inflammatory in etiology. There is fat stranding about the uncinate process and the duodenum. Associated pancreatitis should be considered. Persistent mild right hydronephrosis. This is likely secondary to inflammatory changes and fluid along the course of the right ureter. Aortic atherosclerosis. Splenomegaly. Small bilateral fat containing inguinal hernias. Electronically Signed   By: Lovey Newcomer M.D.   On: 04/19/2016 12:23   Ct Image Guided Drainage By Percutaneous Catheter  Result Date: 04/21/2016 INDICATION: The history of pancreatic leak and pseudocyst formation with chronic retroperitoneal fluid collection, now with fistulous communication to the dermal surface of the anterior aspect the right lower abdominal quadrant. Request made for CT-guided placement of a percutaneous drainage catheter for fluid source control. EXAM: CT IMAGE GUIDED DRAINAGE BY PERCUTANEOUS CATHETER COMPARISON:  CT abdomen pelvis - 04/19/2016; 07/24/2015 MEDICATIONS: The patient is currently admitted to the hospital and receiving intravenous antibiotics. The antibiotics were administered within an appropriate time frame prior to the initiation of the procedure. ANESTHESIA/SEDATION: Moderate (conscious) sedation was employed during this procedure. A total  of Versed 2 mg and Fentanyl 75 mcg was administered intravenously. Moderate Sedation Time: 16 minutes. The patient's level of consciousness and vital signs were monitored continuously by radiology nursing throughout the  procedure under my direct supervision. CONTRAST:  None COMPLICATIONS: None immediate. PROCEDURE: Informed written consent was obtained from the patient after a discussion of the risks, benefits and alternatives to treatment. The patient was placed supine on the CT gantry and a pre procedural CT was performed re-demonstrating the known abscess/fluid collection within the location. The procedure was planned. A timeout was performed prior to the initiation of the procedure. The skin overlying in the anterolateral aspect of the right lower abdomen/pelvis was prepped and draped in the usual sterile fashion. The overlying soft tissues were anesthetized with 1% lidocaine with epinephrine. Appropriate trajectory was planned with the use of a 22 gauge spinal needle. An 18 gauge trocar needle was advanced into the abscess/fluid collection and a short Amplatz super stiff wire was coiled within the collection. Appropriate positioning was confirmed with a limited CT scan. The tract was serially dilated allowing placement of a 10 [place] all-purpose drainage catheter. Appropriate positioning was confirmed with a limited postprocedural CT scan. Approximately 90 Ml of yellow colored fluid was aspirated. The tube was connected to a JP bulb and sutured in place. A dressing was placed. The patient tolerated the procedure well without immediate post procedural complication. IMPRESSION: Successful CT guided placement of a 10 French all purpose drain catheter into the right lower abdominal quadrant retroperitoneal fluid collection with aspiration of 90 mL of yellow colored fluid. Samples were sent to the laboratory as requested by the ordering clinical team, including samples sent for both amylase and lipase levels. Electronically Signed   By: Sandi Mariscal M.D.   On: 04/21/2016 14:00    Microbiology: Recent Results (from the past 240 hour(s))  Blood culture (routine x 2)     Status: None (Preliminary result)   Collection Time:  04/19/16 11:53 PM  Result Value Ref Range Status   Specimen Description BLOOD RIGHT ARM  Final   Special Requests BOTTLES DRAWN AEROBIC AND ANAEROBIC 6CC  Final   Culture NO GROWTH 2 DAYS  Final   Report Status PENDING  Incomplete  Blood culture (routine x 2)     Status: None (Preliminary result)   Collection Time: 04/20/16 12:03 AM  Result Value Ref Range Status   Specimen Description BLOOD RIGHT HAND  Final   Special Requests BOTTLES DRAWN AEROBIC AND ANAEROBIC 6CC  Final   Culture NO GROWTH 2 DAYS  Final   Report Status PENDING  Incomplete  Aerobic Culture (superficial specimen)     Status: Abnormal   Collection Time: 04/20/16  1:38 PM  Result Value Ref Range Status   Specimen Description WOUND  Final   Special Requests ABSCESS  Final   Gram Stain   Final    MODERATE WBC PRESENT, PREDOMINANTLY PMN FEW GRAM POSITIVE COCCI IN PAIRS FEW GRAM NEGATIVE RODS Performed at [facility], [place] [address], [place], [place] [address]    Culture MULTIPLE ORGANISMS PRESENT, NONE PREDOMINANT (A)  Final   Report Status 04/22/2016 FINAL  Final  Aerobic/Anaerobic Culture (surgical/deep wound)     Status: None (Preliminary result)   Collection Time: 04/21/16  1:48 PM  Result Value Ref Range Status   Specimen Description ABSCESS  Final   Special Requests POST CT GUIDED DRAIN PLACEMENT  Final   Gram Stain   Final    ABUNDANT  WBC PRESENT, PREDOMINANTLY PMN RARE YEAST    Culture CULTURE REINCUBATED FOR BETTER GROWTH  Final   Report Status PENDING  Incomplete     Labs: Basic Metabolic Panel:  Recent Labs Lab 04/19/16 1026 04/19/16 2258 04/20/16 0803 04/22/16 0502  NA 133* 133* 132* 138  K 3.8 3.8 3.5 3.7  CL 102 101 103 105  CO2 24 26 22 26   GLUCOSE 107* 118* 92 102*  BUN 18 17 16 13   CREATININE 0.95 1.05 0.89 0.89  CALCIUM 8.7* 8.6* 8.3* 8.7*   Liver Function Tests:  Recent Labs Lab 04/19/16 1026 04/19/16 2258  AST 13* 18  ALT 11* 14*  ALKPHOS 44 46  BILITOT 1.8*  1.5*  PROT 7.0 6.5  ALBUMIN 3.1* 2.9*    Recent Labs Lab 04/19/16 1026 04/19/16 2258  LIPASE 29 24   No results for input(s): AMMONIA in the last 168 hours. CBC:  Recent Labs Lab 04/19/16 1026 04/19/16 2258 04/20/16 0803 04/22/16 0502  WBC 8.4 9.0 7.9 4.7  NEUTROABS 6.0 6.7  --   --   HGB 10.7* 9.9* 9.3* 9.8*  HCT 32.6* 29.9* 28.3* 30.5*  MCV 89.3 89.3 89.6 88.9  PLT 119* 116* 108* 148*   Cardiac Enzymes: No results for input(s): CKTOTAL, CKMB, CKMBINDEX, TROPONINI in the last 168 hours. BNP: BNP (last 3 results) No results for input(s): BNP in the last 8760 hours.  ProBNP (last 3 results) No results for input(s): PROBNP in the last 8760 hours.  CBG: No results for input(s): GLUCAP in the last 168 hours.     SignedHosie Poisson MD.  [facility] 04/22/2016, 1:31 PM

## 2016-04-22 NOTE — Progress Notes (Signed)
Pharmacy Antibiotic Note  Andrew Watson is a 76 y.o. male admitted on 04/19/2016 with wound infection / intra abdominal infection.  Pharmacy has been consulted for Powell dosing.  Add Diflucan 04/22/2016  Plan:  Continue Vancomycin 1000mg  IV q12h Check trough at steady state (defer with plan noted to likely switch to oral Augmentin) Zosyn 3.375gm IV q8h, EID Monitor labs, renal fxn, progress and c/s Deescalate ABX when improved / appropriate.   Diflucan 800mg  orally x 1, followed by 400mg  every 24 hours.  Height: 5\' 11"  (180.3 cm) Weight: 204 lb 5.9 oz (92.7 kg) IBW/kg (Calculated) : 75.3  Temp (24hrs), Avg:98.1 F (36.7 C), Min:97.8 F (36.6 C), Max:98.6 F (37 C)   Recent Labs Lab 04/19/16 1026 04/19/16 2258 04/20/16 0803 04/22/16 0502  WBC 8.4 9.0 7.9 4.7  CREATININE 0.95 1.05 0.89 0.89    Estimated Creatinine Clearance: 82.2 mL/min (by C-G formula based on SCr of 0.89 mg/dL).    No Known Allergies  Antimicrobials this admission: Vancomycin 3/26 >>  Zosyn 3/26 >>  Fluconazole 3/28 >>  Dose adjustments this admission:  Microbiology results: 3/26 BCx: pending  3/27 Wound: Rare Yeast   Thank you for allowing pharmacy to be a part of this patient's care.  Pricilla Larsson 04/22/2016 11:41 AM

## 2016-04-22 NOTE — Progress Notes (Signed)
Patient ID: Andrew Watson, male   DOB: 07-01-1940, 76 y.o.   MRN: [id number]   Right abd abscess drain placed 3/27  OP 95 cc yesterday RN says pt has no complaints Says drain is flushing well  cx pending Wbc wnl  Will follow by phone May need drain follow up in IR OP drain clinic We will place follow up in chart

## 2016-04-22 NOTE — Care Management Note (Signed)
Case Management Note  Patient Details  Name: DRU PRIMEAU MRN: [id number] Date of Birth: 21-Sep-1940  Subjective/Objective:     Patient adm from home with retroperitoneal abscess. He is from home, ind with ADL's. Had drain placed 04/21/2016. Will need HH RN. Would like [facility].   Action/Plan: Romualdo Bolk made aware of referral and will obtain from chart. Patient aware [facility] has 48 hours to initiate services. No other CM needs.    Expected Discharge Date:  04/20/16               Expected Discharge Plan:  [place]  In-House Referral:     Discharge planning Services  CM Consult  Post Acute Care Choice:  Home Health Choice offered to:  Patient  DME Arranged:    DME Agency:     HH Arranged:  RN [place] Agency:  [place]  Status of Service:  Completed, signed off  If discussed at [place] of Stay Meetings, dates discussed:    Additional Comments:  Mersadez Linden, Chauncey Reading, RN 04/22/2016, 12:21 PM

## 2016-04-22 NOTE — Progress Notes (Signed)
Discharge instructions gone over with patient and his family. Verbalized understanding. IV removed. Patient tolerated procedure well.

## 2016-04-22 NOTE — Progress Notes (Signed)
Subjective: Patient has no complaints. Tolerated yesterday's procedure well.  Objective: Vital signs in last 24 hours: Temp:  [97.8 F (36.6 C)-98.6 F (37 C)] 97.8 F (36.6 C) (03/28 0410) Pulse Rate:  [71-90] 72 (03/28 0410) Resp:  [12-21] 18 (03/28 0410) BP: (95-134)/(49-74) 134/74 (03/28 0410) SpO2:  [98 %-100 %] 100 % (03/28 0410) Last BM Date: 04/19/16  Intake/Output from previous day: 03/27 0701 - 03/28 0700 In: 795 [P.O.:240; IV Piggyback:550] Out: 295 [Urine:200; Drains:95] Intake/Output this shift: No intake/output data recorded.  General appearance: alert, cooperative and no distress GI: Soft, nontender, nondistended. JP bulb with cloudy serosanguineous fluid present.  Lab Results:   Recent Labs  04/20/16 0803 04/22/16 0502  WBC 7.9 4.7  HGB 9.3* 9.8*  HCT 28.3* 30.5*  PLT 108* 148*   BMET  Recent Labs  04/20/16 0803 04/22/16 0502  NA 132* 138  K 3.5 3.7  CL 103 105  CO2 22 26  GLUCOSE 92 102*  BUN 16 13  CREATININE 0.89 0.89  CALCIUM 8.3* 8.7*   PT/INR  Recent Labs  04/20/16 1108  LABPROT 17.5*  INR 1.42    Studies/Results: Ct Image Guided Drainage By Percutaneous Catheter  Result Date: 04/21/2016 INDICATION: The history of pancreatic leak and pseudocyst formation with chronic retroperitoneal fluid collection, now with fistulous communication to the dermal surface of the anterior aspect the right lower abdominal quadrant. Request made for CT-guided placement of a percutaneous drainage catheter for fluid source control. EXAM: CT IMAGE GUIDED DRAINAGE BY PERCUTANEOUS CATHETER COMPARISON:  CT abdomen pelvis - 04/19/2016; 07/24/2015 MEDICATIONS: The patient is currently admitted to the hospital and receiving intravenous antibiotics. The antibiotics were administered within an appropriate time frame prior to the initiation of the procedure. ANESTHESIA/SEDATION: Moderate (conscious) sedation was employed during this procedure. A total of Versed  2 mg and Fentanyl 75 mcg was administered intravenously. Moderate Sedation Time: 16 minutes. The patient's level of consciousness and vital signs were monitored continuously by radiology nursing throughout the procedure under my direct supervision. CONTRAST:  None COMPLICATIONS: None immediate. PROCEDURE: Informed written consent was obtained from the patient after a discussion of the risks, benefits and alternatives to treatment. The patient was placed supine on the CT gantry and a pre procedural CT was performed re-demonstrating the known abscess/fluid collection within the location. The procedure was planned. A timeout was performed prior to the initiation of the procedure. The skin overlying in the anterolateral aspect of the right lower abdomen/pelvis was prepped and draped in the usual sterile fashion. The overlying soft tissues were anesthetized with 1% lidocaine with epinephrine. Appropriate trajectory was planned with the use of a 22 gauge spinal needle. An 18 gauge trocar needle was advanced into the abscess/fluid collection and a short Amplatz super stiff wire was coiled within the collection. Appropriate positioning was confirmed with a limited CT scan. The tract was serially dilated allowing placement of a 10 [place] all-purpose drainage catheter. Appropriate positioning was confirmed with a limited postprocedural CT scan. Approximately 90 Ml of yellow colored fluid was aspirated. The tube was connected to a JP bulb and sutured in place. A dressing was placed. The patient tolerated the procedure well without immediate post procedural complication. IMPRESSION: Successful CT guided placement of a 10 French all purpose drain catheter into the right lower abdominal quadrant retroperitoneal fluid collection with aspiration of 90 mL of yellow colored fluid. Samples were sent to the laboratory as requested by the ordering clinical team, including samples sent for both amylase  and lipase levels. Electronically  Signed   By: Sandi Mariscal M.D.   On: 04/21/2016 14:00  Gram stain reveals rare yeast, mixed flora.  Anti-infectives: Anti-infectives    Start     Dose/Rate Route Frequency Ordered Stop   04/20/16 1000  vancomycin (VANCOCIN) IVPB 1000 mg/200 mL premix     1,000 mg 200 mL/hr over 60 Minutes Intravenous Every 12 hours 04/20/16 0733     04/20/16 0800  piperacillin-tazobactam (ZOSYN) IVPB 3.375 g     3.375 g 12.5 mL/hr over 240 Minutes Intravenous Every 8 hours 04/20/16 0732     04/19/16 2330  piperacillin-tazobactam (ZOSYN) IVPB 3.375 g     3.375 g 100 mL/hr over 30 Minutes Intravenous  Once 04/19/16 2325 04/20/16 0053   04/19/16 2330  vancomycin (VANCOCIN) IVPB 1000 mg/200 mL premix     1,000 mg 200 mL/hr over 60 Minutes Intravenous  Once 04/19/16 2325 04/20/16 0134      Assessment/Plan: s/p Percutaneous drainage of retroperitoneal fluid collection. Plan: Okay for discharge from surgery standpoint. Patient is following up with his gastroenterologist at [facility] next week. Given the findings on Gram stain, I would prescribe Augmentin and Diflucan. A 10-14 day course may be needed, pending final identification results.  LOS: 2 days    Aviva Signs 04/22/2016

## 2016-04-23 LAB — LIPASE, FLUID: Lipase-Fluid: 5984 U/L

## 2016-04-25 LAB — CULTURE, BLOOD (ROUTINE X 2)
Culture: NO GROWTH
Culture: NO GROWTH

## 2016-04-26 LAB — AEROBIC/ANAEROBIC CULTURE (SURGICAL/DEEP WOUND)

## 2016-04-26 LAB — AEROBIC/ANAEROBIC CULTURE W GRAM STAIN (SURGICAL/DEEP WOUND)

## 2016-04-28 DIAGNOSIS — K851 Biliary acute pancreatitis without necrosis or infection: Secondary | ICD-10-CM | POA: Diagnosis not present

## 2016-04-28 DIAGNOSIS — K6819 Other retroperitoneal abscess: Secondary | ICD-10-CM | POA: Diagnosis not present

## 2016-04-30 DIAGNOSIS — K851 Biliary acute pancreatitis without necrosis or infection: Secondary | ICD-10-CM | POA: Diagnosis not present

## 2016-05-07 DIAGNOSIS — R188 Other ascites: Secondary | ICD-10-CM | POA: Diagnosis not present

## 2016-05-07 DIAGNOSIS — K265 Chronic or unspecified duodenal ulcer with perforation: Secondary | ICD-10-CM | POA: Diagnosis not present

## 2016-05-12 DIAGNOSIS — K219 Gastro-esophageal reflux disease without esophagitis: Secondary | ICD-10-CM | POA: Diagnosis not present

## 2016-05-12 DIAGNOSIS — Z978 Presence of other specified devices: Secondary | ICD-10-CM | POA: Diagnosis not present

## 2016-05-12 DIAGNOSIS — E039 Hypothyroidism, unspecified: Secondary | ICD-10-CM | POA: Diagnosis not present

## 2016-05-12 DIAGNOSIS — Z79899 Other long term (current) drug therapy: Secondary | ICD-10-CM | POA: Diagnosis not present

## 2016-05-12 DIAGNOSIS — K316 Fistula of stomach and duodenum: Secondary | ICD-10-CM | POA: Diagnosis not present

## 2016-05-12 DIAGNOSIS — K632 Fistula of intestine: Secondary | ICD-10-CM | POA: Diagnosis not present

## 2016-05-14 DIAGNOSIS — M76892 Other specified enthesopathies of left lower limb, excluding foot: Secondary | ICD-10-CM | POA: Diagnosis not present

## 2016-05-14 DIAGNOSIS — M1612 Unilateral primary osteoarthritis, left hip: Secondary | ICD-10-CM | POA: Diagnosis not present

## 2016-05-15 DIAGNOSIS — M76892 Other specified enthesopathies of left lower limb, excluding foot: Secondary | ICD-10-CM | POA: Diagnosis not present

## 2016-08-03 DIAGNOSIS — M7600 Gluteal tendinitis, unspecified hip: Secondary | ICD-10-CM | POA: Diagnosis not present

## 2016-08-03 DIAGNOSIS — J060 Acute laryngopharyngitis: Secondary | ICD-10-CM | POA: Diagnosis not present

## 2016-08-03 DIAGNOSIS — N6019 Diffuse cystic mastopathy of unspecified breast: Secondary | ICD-10-CM | POA: Diagnosis not present

## 2016-08-03 DIAGNOSIS — J309 Allergic rhinitis, unspecified: Secondary | ICD-10-CM | POA: Diagnosis not present

## 2016-09-06 DIAGNOSIS — R1013 Epigastric pain: Secondary | ICD-10-CM | POA: Diagnosis not present

## 2016-09-06 DIAGNOSIS — Z9884 Bariatric surgery status: Secondary | ICD-10-CM | POA: Diagnosis not present

## 2016-09-06 DIAGNOSIS — I4891 Unspecified atrial fibrillation: Secondary | ICD-10-CM | POA: Diagnosis not present

## 2016-09-06 DIAGNOSIS — E039 Hypothyroidism, unspecified: Secondary | ICD-10-CM | POA: Diagnosis not present

## 2016-09-06 DIAGNOSIS — R112 Nausea with vomiting, unspecified: Secondary | ICD-10-CM | POA: Diagnosis not present

## 2016-09-06 DIAGNOSIS — Z4682 Encounter for fitting and adjustment of non-vascular catheter: Secondary | ICD-10-CM | POA: Diagnosis not present

## 2016-09-06 DIAGNOSIS — K651 Peritoneal abscess: Secondary | ICD-10-CM | POA: Diagnosis not present

## 2016-09-06 DIAGNOSIS — Z79899 Other long term (current) drug therapy: Secondary | ICD-10-CM | POA: Diagnosis not present

## 2016-09-06 DIAGNOSIS — R748 Abnormal levels of other serum enzymes: Secondary | ICD-10-CM | POA: Diagnosis not present

## 2016-09-08 DIAGNOSIS — K315 Obstruction of duodenum: Secondary | ICD-10-CM | POA: Diagnosis not present

## 2016-09-08 DIAGNOSIS — K316 Fistula of stomach and duodenum: Secondary | ICD-10-CM | POA: Diagnosis not present

## 2016-09-08 DIAGNOSIS — R1013 Epigastric pain: Secondary | ICD-10-CM | POA: Diagnosis not present

## 2016-09-11 DIAGNOSIS — T85898A Other specified complication of other internal prosthetic devices, implants and grafts, initial encounter: Secondary | ICD-10-CM | POA: Diagnosis not present

## 2016-09-11 DIAGNOSIS — K863 Pseudocyst of pancreas: Secondary | ICD-10-CM | POA: Diagnosis not present

## 2016-09-11 DIAGNOSIS — I959 Hypotension, unspecified: Secondary | ICD-10-CM | POA: Diagnosis not present

## 2016-09-11 DIAGNOSIS — K851 Biliary acute pancreatitis without necrosis or infection: Secondary | ICD-10-CM | POA: Diagnosis not present

## 2016-09-11 DIAGNOSIS — R109 Unspecified abdominal pain: Secondary | ICD-10-CM | POA: Diagnosis not present

## 2016-09-11 DIAGNOSIS — K830 Cholangitis: Secondary | ICD-10-CM | POA: Diagnosis not present

## 2016-09-11 DIAGNOSIS — R0989 Other specified symptoms and signs involving the circulatory and respiratory systems: Secondary | ICD-10-CM | POA: Diagnosis not present

## 2016-09-11 DIAGNOSIS — Z86711 Personal history of pulmonary embolism: Secondary | ICD-10-CM | POA: Diagnosis not present

## 2016-09-11 DIAGNOSIS — R0682 Tachypnea, not elsewhere classified: Secondary | ICD-10-CM | POA: Diagnosis not present

## 2016-09-11 DIAGNOSIS — R918 Other nonspecific abnormal finding of lung field: Secondary | ICD-10-CM | POA: Diagnosis not present

## 2016-09-11 DIAGNOSIS — R0603 Acute respiratory distress: Secondary | ICD-10-CM | POA: Diagnosis not present

## 2016-09-11 DIAGNOSIS — K311 Adult hypertrophic pyloric stenosis: Secondary | ICD-10-CM | POA: Diagnosis not present

## 2016-09-11 DIAGNOSIS — R0902 Hypoxemia: Secondary | ICD-10-CM | POA: Diagnosis not present

## 2016-09-11 DIAGNOSIS — I10 Essential (primary) hypertension: Secondary | ICD-10-CM | POA: Diagnosis not present

## 2016-09-11 DIAGNOSIS — K6389 Other specified diseases of intestine: Secondary | ICD-10-CM | POA: Diagnosis not present

## 2016-09-11 DIAGNOSIS — K632 Fistula of intestine: Secondary | ICD-10-CM | POA: Diagnosis not present

## 2016-09-11 DIAGNOSIS — R1013 Epigastric pain: Secondary | ICD-10-CM | POA: Diagnosis not present

## 2016-09-11 DIAGNOSIS — Z4659 Encounter for fitting and adjustment of other gastrointestinal appliance and device: Secondary | ICD-10-CM | POA: Diagnosis not present

## 2016-09-11 DIAGNOSIS — T183XXA Foreign body in small intestine, initial encounter: Secondary | ICD-10-CM | POA: Diagnosis not present

## 2016-09-11 DIAGNOSIS — J690 Pneumonitis due to inhalation of food and vomit: Secondary | ICD-10-CM | POA: Diagnosis not present

## 2016-09-11 DIAGNOSIS — J189 Pneumonia, unspecified organism: Secondary | ICD-10-CM | POA: Diagnosis not present

## 2016-09-11 DIAGNOSIS — Z4682 Encounter for fitting and adjustment of non-vascular catheter: Secondary | ICD-10-CM | POA: Diagnosis not present

## 2016-09-11 DIAGNOSIS — E039 Hypothyroidism, unspecified: Secondary | ICD-10-CM | POA: Diagnosis not present

## 2016-09-11 DIAGNOSIS — Z978 Presence of other specified devices: Secondary | ICD-10-CM | POA: Diagnosis not present

## 2016-09-11 DIAGNOSIS — K316 Fistula of stomach and duodenum: Secondary | ICD-10-CM | POA: Diagnosis not present

## 2016-09-12 DIAGNOSIS — J189 Pneumonia, unspecified organism: Secondary | ICD-10-CM | POA: Diagnosis not present

## 2016-09-12 DIAGNOSIS — R0989 Other specified symptoms and signs involving the circulatory and respiratory systems: Secondary | ICD-10-CM | POA: Diagnosis not present

## 2016-09-13 DIAGNOSIS — I10 Essential (primary) hypertension: Secondary | ICD-10-CM | POA: Diagnosis not present

## 2016-09-13 DIAGNOSIS — K311 Adult hypertrophic pyloric stenosis: Secondary | ICD-10-CM | POA: Diagnosis not present

## 2016-09-13 DIAGNOSIS — E039 Hypothyroidism, unspecified: Secondary | ICD-10-CM | POA: Diagnosis not present

## 2016-09-14 DIAGNOSIS — K851 Biliary acute pancreatitis without necrosis or infection: Secondary | ICD-10-CM | POA: Diagnosis not present

## 2016-09-14 DIAGNOSIS — J690 Pneumonitis due to inhalation of food and vomit: Secondary | ICD-10-CM | POA: Diagnosis not present

## 2016-09-14 DIAGNOSIS — T183XXA Foreign body in small intestine, initial encounter: Secondary | ICD-10-CM | POA: Diagnosis not present

## 2016-09-14 DIAGNOSIS — Z4659 Encounter for fitting and adjustment of other gastrointestinal appliance and device: Secondary | ICD-10-CM | POA: Diagnosis not present

## 2016-09-15 DIAGNOSIS — R0603 Acute respiratory distress: Secondary | ICD-10-CM | POA: Diagnosis not present

## 2016-09-15 DIAGNOSIS — R1013 Epigastric pain: Secondary | ICD-10-CM | POA: Diagnosis not present

## 2016-09-15 DIAGNOSIS — K6389 Other specified diseases of intestine: Secondary | ICD-10-CM | POA: Diagnosis not present

## 2016-09-25 DIAGNOSIS — K8010 Calculus of gallbladder with chronic cholecystitis without obstruction: Secondary | ICD-10-CM | POA: Diagnosis not present

## 2016-09-29 DIAGNOSIS — K567 Ileus, unspecified: Secondary | ICD-10-CM | POA: Diagnosis not present

## 2016-09-29 DIAGNOSIS — R062 Wheezing: Secondary | ICD-10-CM | POA: Diagnosis not present

## 2016-09-29 DIAGNOSIS — K811 Chronic cholecystitis: Secondary | ICD-10-CM | POA: Diagnosis not present

## 2016-09-29 DIAGNOSIS — R188 Other ascites: Secondary | ICD-10-CM | POA: Diagnosis not present

## 2016-09-29 DIAGNOSIS — K316 Fistula of stomach and duodenum: Secondary | ICD-10-CM | POA: Diagnosis not present

## 2016-09-29 DIAGNOSIS — T797XXA Traumatic subcutaneous emphysema, initial encounter: Secondary | ICD-10-CM | POA: Diagnosis not present

## 2016-09-29 DIAGNOSIS — G8918 Other acute postprocedural pain: Secondary | ICD-10-CM | POA: Diagnosis not present

## 2016-09-29 DIAGNOSIS — R109 Unspecified abdominal pain: Secondary | ICD-10-CM | POA: Diagnosis not present

## 2016-09-29 DIAGNOSIS — Z4682 Encounter for fitting and adjustment of non-vascular catheter: Secondary | ICD-10-CM | POA: Diagnosis not present

## 2016-09-29 DIAGNOSIS — R918 Other nonspecific abnormal finding of lung field: Secondary | ICD-10-CM | POA: Diagnosis not present

## 2016-09-29 DIAGNOSIS — R0602 Shortness of breath: Secondary | ICD-10-CM | POA: Diagnosis not present

## 2016-09-29 DIAGNOSIS — E039 Hypothyroidism, unspecified: Secondary | ICD-10-CM | POA: Diagnosis not present

## 2016-09-29 DIAGNOSIS — K823 Fistula of gallbladder: Secondary | ICD-10-CM | POA: Diagnosis not present

## 2016-09-29 DIAGNOSIS — T85520A Displacement of bile duct prosthesis, initial encounter: Secondary | ICD-10-CM | POA: Diagnosis not present

## 2016-09-29 DIAGNOSIS — K560 Paralytic ileus: Secondary | ICD-10-CM | POA: Diagnosis not present

## 2016-09-29 DIAGNOSIS — K6389 Other specified diseases of intestine: Secondary | ICD-10-CM | POA: Diagnosis not present

## 2016-09-29 HISTORY — PX: DUODENOTOMY: SUR426

## 2016-09-30 DIAGNOSIS — R109 Unspecified abdominal pain: Secondary | ICD-10-CM | POA: Diagnosis not present

## 2016-09-30 DIAGNOSIS — G8918 Other acute postprocedural pain: Secondary | ICD-10-CM | POA: Diagnosis not present

## 2016-10-01 DIAGNOSIS — R109 Unspecified abdominal pain: Secondary | ICD-10-CM | POA: Diagnosis not present

## 2016-10-01 DIAGNOSIS — Z4682 Encounter for fitting and adjustment of non-vascular catheter: Secondary | ICD-10-CM | POA: Diagnosis not present

## 2016-10-01 DIAGNOSIS — K6389 Other specified diseases of intestine: Secondary | ICD-10-CM | POA: Diagnosis not present

## 2016-10-01 DIAGNOSIS — R918 Other nonspecific abnormal finding of lung field: Secondary | ICD-10-CM | POA: Diagnosis not present

## 2016-10-01 DIAGNOSIS — G8918 Other acute postprocedural pain: Secondary | ICD-10-CM | POA: Diagnosis not present

## 2016-10-01 DIAGNOSIS — T797XXA Traumatic subcutaneous emphysema, initial encounter: Secondary | ICD-10-CM | POA: Diagnosis not present

## 2016-10-01 DIAGNOSIS — R0602 Shortness of breath: Secondary | ICD-10-CM | POA: Diagnosis not present

## 2016-10-01 DIAGNOSIS — K560 Paralytic ileus: Secondary | ICD-10-CM | POA: Diagnosis not present

## 2016-10-02 DIAGNOSIS — G8918 Other acute postprocedural pain: Secondary | ICD-10-CM | POA: Diagnosis not present

## 2016-10-02 DIAGNOSIS — T797XXA Traumatic subcutaneous emphysema, initial encounter: Secondary | ICD-10-CM | POA: Diagnosis not present

## 2016-10-02 DIAGNOSIS — R109 Unspecified abdominal pain: Secondary | ICD-10-CM | POA: Diagnosis not present

## 2016-10-02 DIAGNOSIS — Z4682 Encounter for fitting and adjustment of non-vascular catheter: Secondary | ICD-10-CM | POA: Diagnosis not present

## 2016-10-03 DIAGNOSIS — R109 Unspecified abdominal pain: Secondary | ICD-10-CM | POA: Diagnosis not present

## 2016-10-03 DIAGNOSIS — G8918 Other acute postprocedural pain: Secondary | ICD-10-CM | POA: Diagnosis not present

## 2016-10-04 DIAGNOSIS — G8918 Other acute postprocedural pain: Secondary | ICD-10-CM | POA: Diagnosis not present

## 2016-10-04 DIAGNOSIS — T797XXA Traumatic subcutaneous emphysema, initial encounter: Secondary | ICD-10-CM | POA: Diagnosis not present

## 2016-10-04 DIAGNOSIS — R109 Unspecified abdominal pain: Secondary | ICD-10-CM | POA: Diagnosis not present

## 2016-10-04 DIAGNOSIS — R188 Other ascites: Secondary | ICD-10-CM | POA: Diagnosis not present

## 2016-10-05 DIAGNOSIS — R062 Wheezing: Secondary | ICD-10-CM | POA: Diagnosis not present

## 2016-10-05 DIAGNOSIS — R918 Other nonspecific abnormal finding of lung field: Secondary | ICD-10-CM | POA: Diagnosis not present

## 2016-10-16 DIAGNOSIS — I959 Hypotension, unspecified: Secondary | ICD-10-CM | POA: Diagnosis not present

## 2016-10-16 DIAGNOSIS — J811 Chronic pulmonary edema: Secondary | ICD-10-CM | POA: Diagnosis not present

## 2016-10-16 DIAGNOSIS — K863 Pseudocyst of pancreas: Secondary | ICD-10-CM | POA: Diagnosis not present

## 2016-10-16 DIAGNOSIS — R7881 Bacteremia: Secondary | ICD-10-CM | POA: Diagnosis not present

## 2016-10-16 DIAGNOSIS — R0902 Hypoxemia: Secondary | ICD-10-CM | POA: Diagnosis not present

## 2016-10-16 DIAGNOSIS — E860 Dehydration: Secondary | ICD-10-CM | POA: Diagnosis not present

## 2016-10-16 DIAGNOSIS — J90 Pleural effusion, not elsewhere classified: Secondary | ICD-10-CM | POA: Diagnosis not present

## 2016-10-16 DIAGNOSIS — A4150 Gram-negative sepsis, unspecified: Secondary | ICD-10-CM | POA: Diagnosis not present

## 2016-10-16 DIAGNOSIS — R109 Unspecified abdominal pain: Secondary | ICD-10-CM | POA: Diagnosis not present

## 2016-10-16 DIAGNOSIS — R6521 Severe sepsis with septic shock: Secondary | ICD-10-CM | POA: Diagnosis not present

## 2016-10-16 DIAGNOSIS — K6811 Postprocedural retroperitoneal abscess: Secondary | ICD-10-CM | POA: Diagnosis not present

## 2016-10-16 DIAGNOSIS — E039 Hypothyroidism, unspecified: Secondary | ICD-10-CM | POA: Diagnosis not present

## 2016-10-16 DIAGNOSIS — R188 Other ascites: Secondary | ICD-10-CM | POA: Diagnosis not present

## 2016-10-16 DIAGNOSIS — Z9049 Acquired absence of other specified parts of digestive tract: Secondary | ICD-10-CM | POA: Diagnosis not present

## 2016-10-16 DIAGNOSIS — R0600 Dyspnea, unspecified: Secondary | ICD-10-CM | POA: Diagnosis not present

## 2016-10-16 DIAGNOSIS — K651 Peritoneal abscess: Secondary | ICD-10-CM | POA: Diagnosis not present

## 2016-10-16 DIAGNOSIS — K766 Portal hypertension: Secondary | ICD-10-CM | POA: Diagnosis not present

## 2016-10-16 DIAGNOSIS — T814XXA Infection following a procedure, initial encounter: Secondary | ICD-10-CM | POA: Diagnosis not present

## 2016-10-16 DIAGNOSIS — R918 Other nonspecific abnormal finding of lung field: Secondary | ICD-10-CM | POA: Diagnosis not present

## 2016-10-17 DIAGNOSIS — R188 Other ascites: Secondary | ICD-10-CM | POA: Diagnosis not present

## 2016-10-17 DIAGNOSIS — R918 Other nonspecific abnormal finding of lung field: Secondary | ICD-10-CM | POA: Diagnosis not present

## 2016-10-17 DIAGNOSIS — R0600 Dyspnea, unspecified: Secondary | ICD-10-CM | POA: Diagnosis not present

## 2016-10-18 DIAGNOSIS — K651 Peritoneal abscess: Secondary | ICD-10-CM | POA: Diagnosis not present

## 2016-10-18 DIAGNOSIS — R0902 Hypoxemia: Secondary | ICD-10-CM | POA: Diagnosis not present

## 2016-10-18 DIAGNOSIS — R7881 Bacteremia: Secondary | ICD-10-CM | POA: Diagnosis not present

## 2016-10-18 DIAGNOSIS — A4150 Gram-negative sepsis, unspecified: Secondary | ICD-10-CM | POA: Diagnosis not present

## 2016-10-19 DIAGNOSIS — R918 Other nonspecific abnormal finding of lung field: Secondary | ICD-10-CM | POA: Diagnosis not present

## 2016-10-19 DIAGNOSIS — A4150 Gram-negative sepsis, unspecified: Secondary | ICD-10-CM | POA: Diagnosis not present

## 2016-10-19 DIAGNOSIS — J811 Chronic pulmonary edema: Secondary | ICD-10-CM | POA: Diagnosis not present

## 2016-10-19 DIAGNOSIS — I959 Hypotension, unspecified: Secondary | ICD-10-CM | POA: Diagnosis not present

## 2016-10-19 DIAGNOSIS — J90 Pleural effusion, not elsewhere classified: Secondary | ICD-10-CM | POA: Diagnosis not present

## 2016-10-19 DIAGNOSIS — R6521 Severe sepsis with septic shock: Secondary | ICD-10-CM | POA: Diagnosis not present

## 2016-10-19 IMAGING — MR MR 3D RECON AT [facility]
18 of 24 series · 18 of 24 positions shown · IV contrast (multihance)
Comparison: CT on 07/24/2015

CLINICAL DATA: Acute pancreatitis. Pancreatic pseudocyst. Weight
loss. Biliary ductal dilatation seen on recent CT.

EXAM:
MRI ABDOMEN WITHOUT AND WITH CONTRAST (INCLUDING MRCP)
TECHNIQUE: Multiplanar multisequence MR imaging of the abdomen was performed
both before and after the administration of intravenous contrast.
Heavily T2-weighted images of the biliary and pancreatic ducts were
obtained, and three-dimensional MRCP images were rendered by post
processing.
CONTRAST:  19mL MULTIHANCE GADOBENATE DIMEGLUMINE 529 MG/ML IV SOLN

[Series 4: T2 fat-sat · axial · 5.0mm · 0.78mm/px · 1 of 58 slices shown]
[im 1/58]
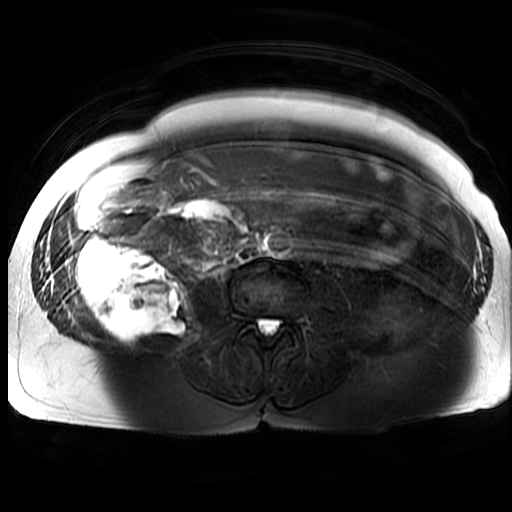

[Series 6: DWI b500 · axial · 6.0mm · 1.48mm/px · 1 of 72 slices shown]
[im 1/72]
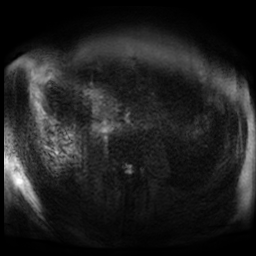

[Series 7: MRCP · coronal · 1.6mm · 0.62mm/px · 1 of 97 slices shown]
[im 1/97]
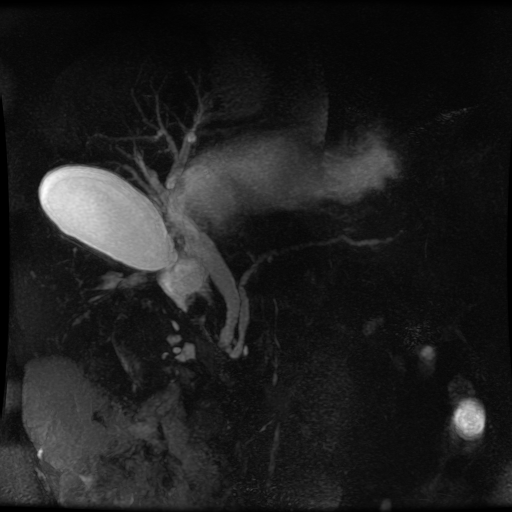

[Series 10: ax dualecho · axial · 5.0mm · 0.78mm/px · 1 of 118 slices shown]
[im 1/118]
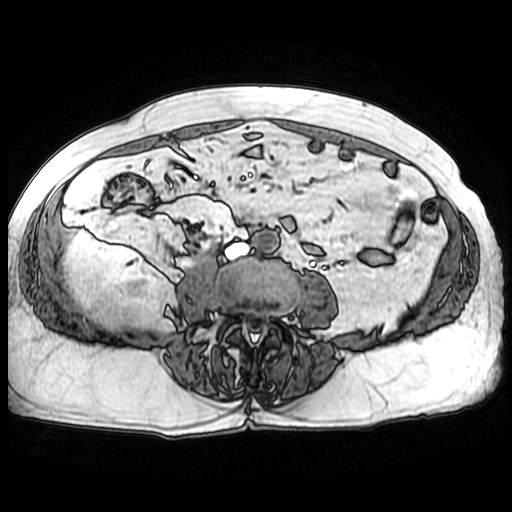

[Series 12: bSSFP fat-sat · coronal · 5.0mm · 0.76mm/px · 1 of 55 slices shown]
[im 1/55]
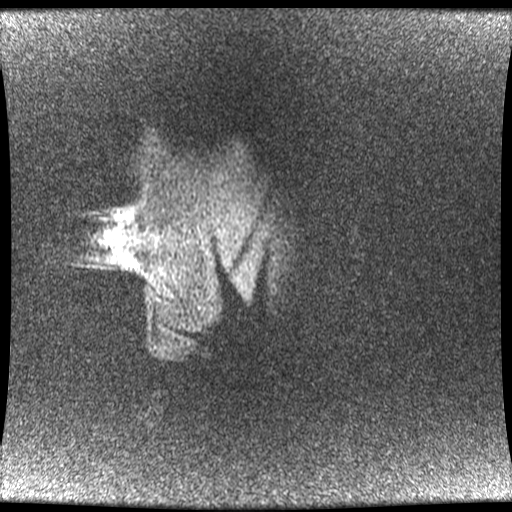

[Series 13: T2 · axial · 5.0mm · 0.78mm/px · 1 of 61 slices shown]
[im 1/61]
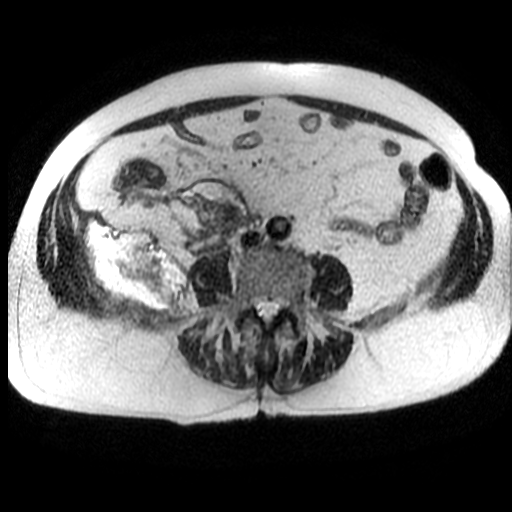

[Series 15: T1 dynamic · coronal · delayed · 4.0mm · 0.78mm/px · 1 of 136 slices shown (1 of 6)]
[im 1/136]
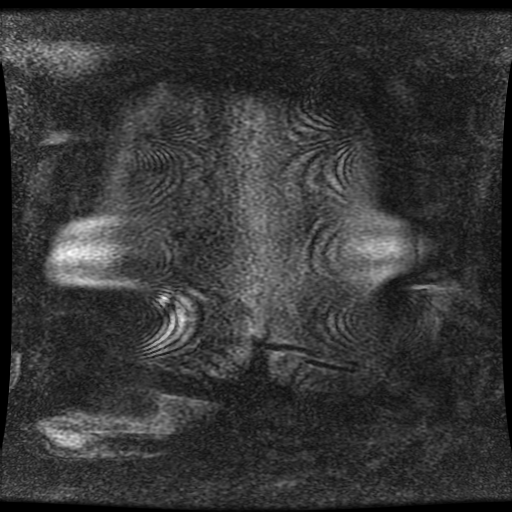

[Series 700: reformatted · coronal · 100.0mm · 0.51mm/px · 1 of 174 slices shown]
[im 1/174]
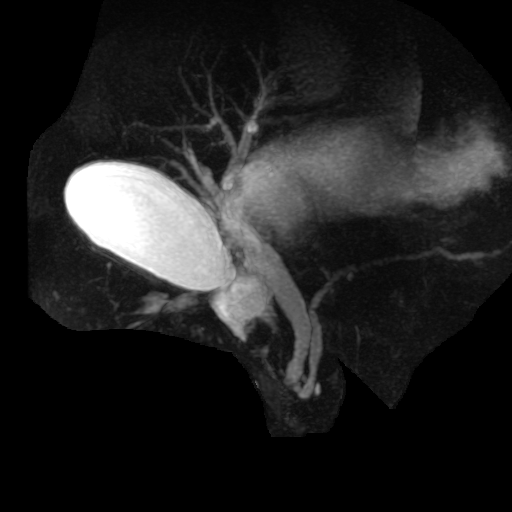

[Series 1400: T1 dynamic · axial · 5.0mm · 0.78mm/px · 1 of 116 slices shown (2 of 6)]
[im 1/116]
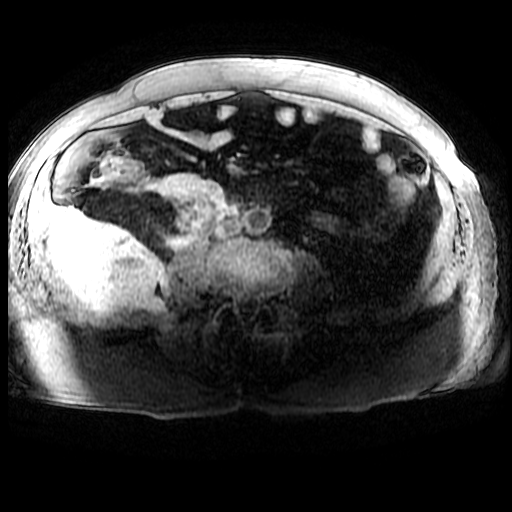

[Series 1401: T1 dynamic · axial · 5.0mm · 0.78mm/px · 1 of 116 slices shown (3 of 6)]
[im 1/116]
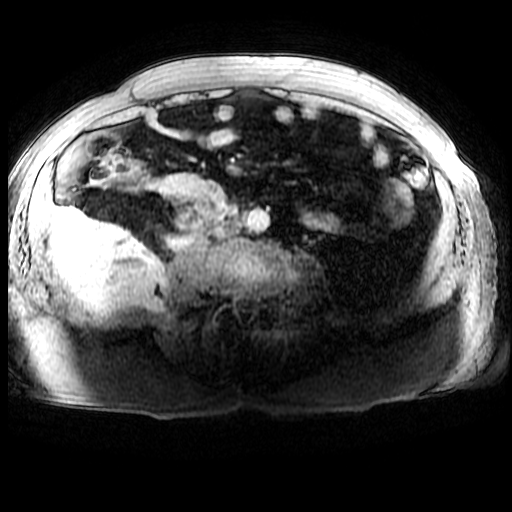

[Series 1403: T1 dynamic · axial · 5.0mm · 0.78mm/px · 1 of 116 slices shown (4 of 6)]
[im 1/116]
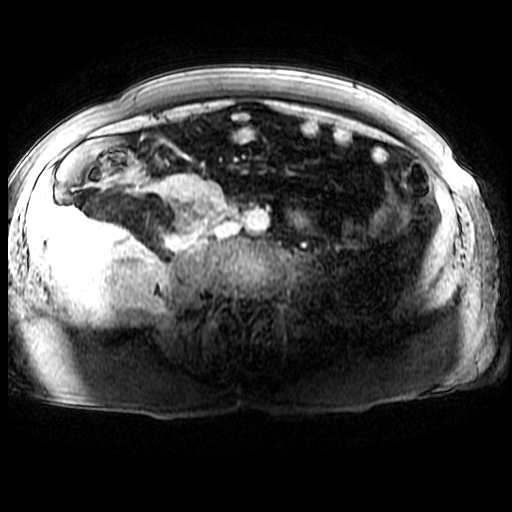

[Series 1404: T1 dynamic · axial · 5.0mm · 0.78mm/px · 1 of 116 slices shown (5 of 6)]
[im 1/116]
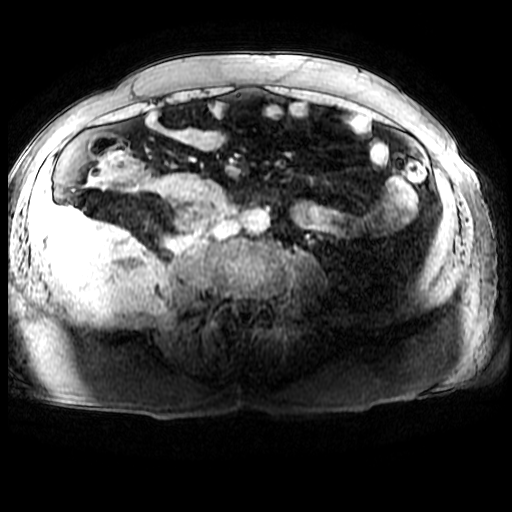

[Series 1405: T1 dynamic · axial · 5.0mm · 0.78mm/px · 1 of 116 slices shown (6 of 6)]
[im 1/116]
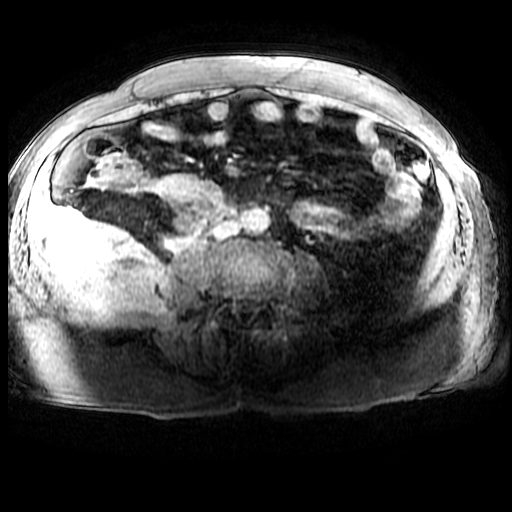

[((id)/(id)/1)-((id)/(id)/1) · axial · 5.0mm · 0.78mm/px · 1 of 116 slices shown (1 of 5)]
[im 1/116]
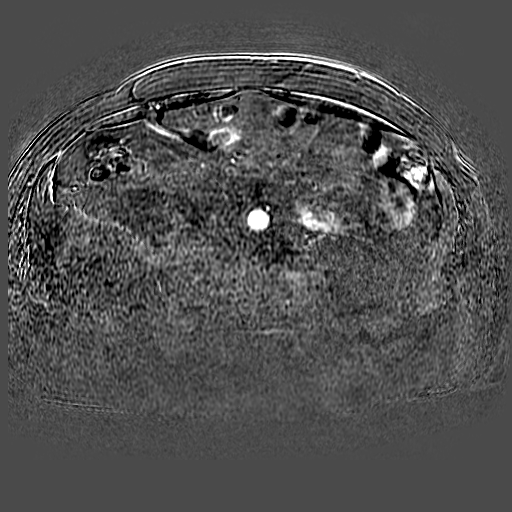

[((id)/(id)/1)-((id)/(id)/1) · axial · 5.0mm · 0.78mm/px · 1 of 116 slices shown (2 of 5)]
[im 1/116]
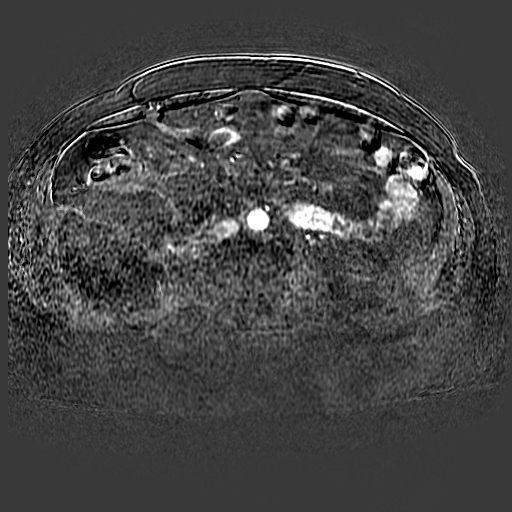

[((id)/(id)/1)-((id)/(id)/1) · axial · 5.0mm · 0.78mm/px · 1 of 116 slices shown (3 of 5)]
[im 1/116]
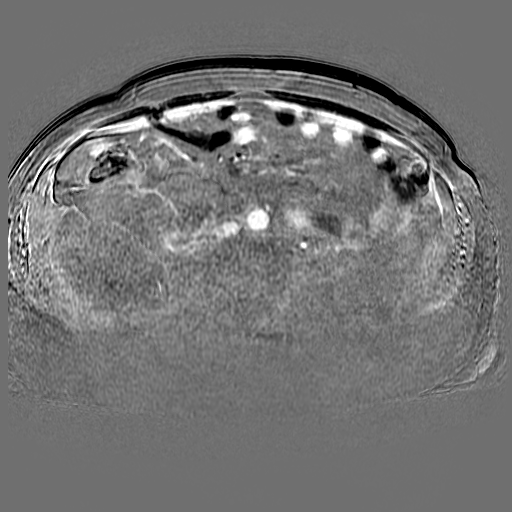

[((id)/(id)/1)-((id)/(id)/1) · axial · 5.0mm · 0.78mm/px · 1 of 116 slices shown (4 of 5)]
[im 1/116]
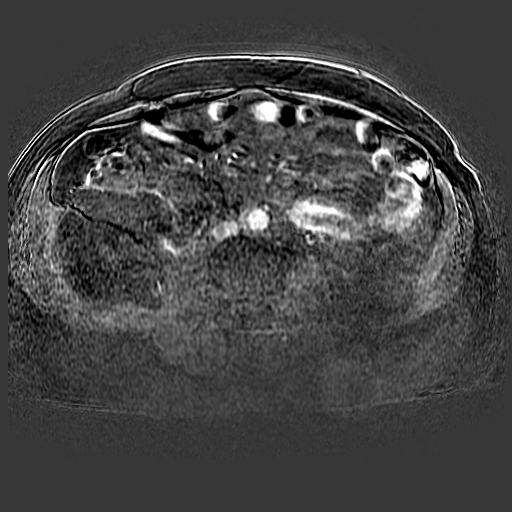

[((id)/(id)/1)-((id)/(id)/1) · axial · 5.0mm · 0.78mm/px · 1 of 116 slices shown (5 of 5)]
[im 1/116]
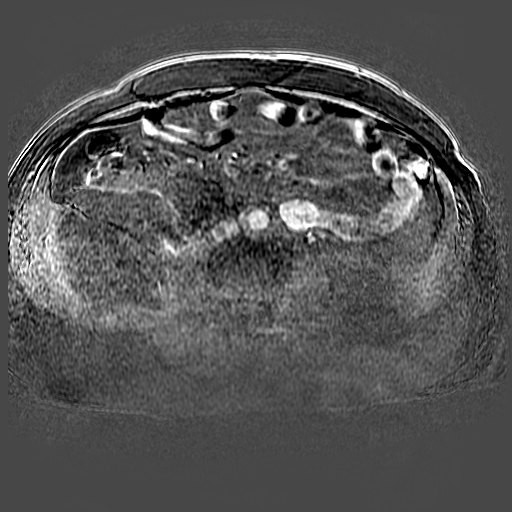

[18 of 24 positions shown; findings below may reference images not displayed]

FINDINGS: Lower chest:  No acute findings.

Hepatobiliary: No liver masses are identified. Mild layering sludge
is seen along the dependent gallbladder wall, however there is no
definite evidence of acute cholecystitis.

Diffuse biliary ductal dilatation seen with common bile duct
measuring 12 mm in diameter. No definite stones seen within the
common bile duct, however there is abrupt stricture of the distal
common bile duct in the pancreatic head.

Pancreas: Mild diffuse pancreatic ductal dilatation is seen, with
abrupt stricture of the main pancreatic duct in the pancreatic head.
Enlargement of the pancreatic head and uncinate process is seen with
adjacent peripancreatic inflammatory changes which involve the
duodenum. No discrete pancreatic mass identified.

Large rim enhancing fluid collection is seen extending inferiorly in
the right retroperitoneum in the right anterior and posterior para
renal spaces. This is incompletely visualized but not significant
change compared to recent CT has consistent with a large pseudocyst.
A smaller pseudocyst is again seen along the superior aspect of the
pancreatic body measuring approximately 3.4 cm, also not
significantly changed.

Spleen:  Within normal limits in size and appearance.

Adrenals/Urinary Tract: No masses identified. No evidence of
hydronephrosis. Tiny bilateral renal cysts again noted.

Stomach/Bowel: Thickening of the descending and transverse portions
of the duodenum adjacent to the pancreas, attributable to acute
pancreatitis.

Vascular/Lymphatic: No pathologically enlarged lymph nodes
identified. No abdominal aortic aneurysm demonstrated.

Other:  None.

Musculoskeletal:  No suspicious bone lesions identified.
IMPRESSION: Acute pancreatitis involving the pancreatic head and uncinate
process. This causes mild diffuse biliary and pancreatic ductal
dilatation. No definite evidence of choledocholithiasis or
pancreatic mass.

Large pseudocyst extending inferiorly in the right retroperitoneum
in the anterior and posterior para renal spaces, which is
incompletely visualized but not significantly changed compared to
recent CT. Stable smaller pseudocyst superior to the pancreatic
body.

Gallbladder sludge, without definite evidence of acute
cholecystitis.

## 2016-10-21 DIAGNOSIS — T814XXA Infection following a procedure, initial encounter: Secondary | ICD-10-CM | POA: Diagnosis not present

## 2016-10-30 DIAGNOSIS — K651 Peritoneal abscess: Secondary | ICD-10-CM | POA: Diagnosis not present

## 2016-10-30 DIAGNOSIS — L0231 Cutaneous abscess of buttock: Secondary | ICD-10-CM | POA: Diagnosis not present

## 2016-10-30 DIAGNOSIS — Z9049 Acquired absence of other specified parts of digestive tract: Secondary | ICD-10-CM | POA: Diagnosis not present

## 2016-10-30 DIAGNOSIS — Z885 Allergy status to narcotic agent status: Secondary | ICD-10-CM | POA: Diagnosis not present

## 2016-10-30 DIAGNOSIS — Z4803 Encounter for change or removal of drains: Secondary | ICD-10-CM | POA: Diagnosis not present

## 2016-12-10 DIAGNOSIS — D225 Melanocytic nevi of trunk: Secondary | ICD-10-CM | POA: Diagnosis not present

## 2016-12-10 DIAGNOSIS — B029 Zoster without complications: Secondary | ICD-10-CM | POA: Diagnosis not present

## 2016-12-10 DIAGNOSIS — L258 Unspecified contact dermatitis due to other agents: Secondary | ICD-10-CM | POA: Diagnosis not present

## 2016-12-10 DIAGNOSIS — L0202 Furuncle of face: Secondary | ICD-10-CM | POA: Diagnosis not present

## 2016-12-22 DIAGNOSIS — H10413 Chronic giant papillary conjunctivitis, bilateral: Secondary | ICD-10-CM | POA: Diagnosis not present

## 2016-12-22 DIAGNOSIS — H01022 Squamous blepharitis right lower eyelid: Secondary | ICD-10-CM | POA: Diagnosis not present

## 2016-12-22 DIAGNOSIS — H01021 Squamous blepharitis right upper eyelid: Secondary | ICD-10-CM | POA: Diagnosis not present

## 2016-12-22 DIAGNOSIS — H04123 Dry eye syndrome of bilateral lacrimal glands: Secondary | ICD-10-CM | POA: Diagnosis not present

## 2017-01-05 DIAGNOSIS — M1612 Unilateral primary osteoarthritis, left hip: Secondary | ICD-10-CM | POA: Diagnosis not present

## 2017-02-24 DIAGNOSIS — E039 Hypothyroidism, unspecified: Secondary | ICD-10-CM | POA: Diagnosis not present

## 2017-02-24 DIAGNOSIS — I10 Essential (primary) hypertension: Secondary | ICD-10-CM | POA: Diagnosis not present

## 2017-03-11 DIAGNOSIS — M1612 Unilateral primary osteoarthritis, left hip: Secondary | ICD-10-CM | POA: Diagnosis not present

## 2017-03-26 ENCOUNTER — Other Ambulatory Visit (HOSPITAL_COMMUNITY): Payer: Self-pay | Admitting: Nurse Practitioner

## 2017-03-26 DIAGNOSIS — I999 Unspecified disorder of circulatory system: Secondary | ICD-10-CM

## 2017-03-31 ENCOUNTER — Ambulatory Visit (HOSPITAL_COMMUNITY): Admission: RE | Admit: 2017-03-31 | Payer: Medicare Other | Source: Ambulatory Visit

## 2017-03-31 ENCOUNTER — Ambulatory Visit (HOSPITAL_COMMUNITY)
Admission: RE | Admit: 2017-03-31 | Discharge: 2017-03-31 | Disposition: A | Discharge status: Home / Self Care | Payer: Medicare Other | Source: Ambulatory Visit | Attending: Nurse Practitioner | Admitting: Nurse Practitioner

## 2017-03-31 DIAGNOSIS — I999 Unspecified disorder of circulatory system: Secondary | ICD-10-CM

## 2017-03-31 DIAGNOSIS — R600 Localized edema: Secondary | ICD-10-CM | POA: Diagnosis not present

## 2017-03-31 DIAGNOSIS — I739 Peripheral vascular disease, unspecified: Secondary | ICD-10-CM | POA: Diagnosis not present

## 2017-04-02 DIAGNOSIS — I350 Nonrheumatic aortic (valve) stenosis: Secondary | ICD-10-CM | POA: Diagnosis not present

## 2017-04-02 DIAGNOSIS — Z86711 Personal history of pulmonary embolism: Secondary | ICD-10-CM | POA: Diagnosis not present

## 2017-04-02 DIAGNOSIS — Z01818 Encounter for other preprocedural examination: Secondary | ICD-10-CM | POA: Diagnosis not present

## 2017-04-02 DIAGNOSIS — I959 Hypotension, unspecified: Secondary | ICD-10-CM | POA: Diagnosis not present

## 2017-04-02 DIAGNOSIS — M1612 Unilateral primary osteoarthritis, left hip: Secondary | ICD-10-CM | POA: Diagnosis not present

## 2017-04-02 HISTORY — DX: Nonrheumatic aortic (valve) stenosis: I35.0

## 2017-04-09 DIAGNOSIS — Z8679 Personal history of other diseases of the circulatory system: Secondary | ICD-10-CM | POA: Diagnosis not present

## 2017-04-09 DIAGNOSIS — Z86711 Personal history of pulmonary embolism: Secondary | ICD-10-CM | POA: Diagnosis not present

## 2017-04-09 DIAGNOSIS — I350 Nonrheumatic aortic (valve) stenosis: Secondary | ICD-10-CM | POA: Diagnosis not present

## 2017-04-14 DIAGNOSIS — I350 Nonrheumatic aortic (valve) stenosis: Secondary | ICD-10-CM | POA: Diagnosis not present

## 2017-04-14 DIAGNOSIS — I4891 Unspecified atrial fibrillation: Secondary | ICD-10-CM | POA: Diagnosis not present

## 2017-04-14 DIAGNOSIS — R0681 Apnea, not elsewhere classified: Secondary | ICD-10-CM | POA: Diagnosis not present

## 2017-04-14 DIAGNOSIS — M1612 Unilateral primary osteoarthritis, left hip: Secondary | ICD-10-CM | POA: Diagnosis not present

## 2017-04-14 DIAGNOSIS — Z836 Family history of other diseases of the respiratory system: Secondary | ICD-10-CM | POA: Diagnosis not present

## 2017-04-14 DIAGNOSIS — Z6833 Body mass index (BMI) 33.0-33.9, adult: Secondary | ICD-10-CM | POA: Diagnosis not present

## 2017-04-14 DIAGNOSIS — Z86711 Personal history of pulmonary embolism: Secondary | ICD-10-CM | POA: Diagnosis not present

## 2017-04-14 DIAGNOSIS — Z8679 Personal history of other diseases of the circulatory system: Secondary | ICD-10-CM | POA: Diagnosis not present

## 2017-04-14 DIAGNOSIS — Z8614 Personal history of Methicillin resistant Staphylococcus aureus infection: Secondary | ICD-10-CM | POA: Diagnosis not present

## 2017-04-14 DIAGNOSIS — Z96642 Presence of left artificial hip joint: Secondary | ICD-10-CM | POA: Diagnosis not present

## 2017-04-14 DIAGNOSIS — E669 Obesity, unspecified: Secondary | ICD-10-CM | POA: Diagnosis not present

## 2017-04-14 DIAGNOSIS — E039 Hypothyroidism, unspecified: Secondary | ICD-10-CM | POA: Diagnosis not present

## 2017-04-14 DIAGNOSIS — Z471 Aftercare following joint replacement surgery: Secondary | ICD-10-CM | POA: Diagnosis not present

## 2017-04-14 DIAGNOSIS — Z8261 Family history of arthritis: Secondary | ICD-10-CM | POA: Diagnosis not present

## 2017-04-15 DIAGNOSIS — I350 Nonrheumatic aortic (valve) stenosis: Secondary | ICD-10-CM | POA: Diagnosis not present

## 2017-04-15 DIAGNOSIS — Z86711 Personal history of pulmonary embolism: Secondary | ICD-10-CM | POA: Diagnosis not present

## 2017-04-15 DIAGNOSIS — Z8679 Personal history of other diseases of the circulatory system: Secondary | ICD-10-CM | POA: Diagnosis not present

## 2017-04-15 DIAGNOSIS — R0681 Apnea, not elsewhere classified: Secondary | ICD-10-CM | POA: Diagnosis not present

## 2017-04-16 DIAGNOSIS — M1612 Unilateral primary osteoarthritis, left hip: Secondary | ICD-10-CM | POA: Diagnosis not present

## 2017-04-17 DIAGNOSIS — M1990 Unspecified osteoarthritis, unspecified site: Secondary | ICD-10-CM | POA: Diagnosis not present

## 2017-04-17 DIAGNOSIS — Z8614 Personal history of Methicillin resistant Staphylococcus aureus infection: Secondary | ICD-10-CM | POA: Diagnosis not present

## 2017-04-17 DIAGNOSIS — Z86711 Personal history of pulmonary embolism: Secondary | ICD-10-CM | POA: Diagnosis not present

## 2017-04-17 DIAGNOSIS — Z96642 Presence of left artificial hip joint: Secondary | ICD-10-CM | POA: Diagnosis not present

## 2017-04-17 DIAGNOSIS — E669 Obesity, unspecified: Secondary | ICD-10-CM | POA: Diagnosis not present

## 2017-04-17 DIAGNOSIS — I350 Nonrheumatic aortic (valve) stenosis: Secondary | ICD-10-CM | POA: Diagnosis not present

## 2017-04-17 DIAGNOSIS — E039 Hypothyroidism, unspecified: Secondary | ICD-10-CM | POA: Diagnosis not present

## 2017-04-17 DIAGNOSIS — Z471 Aftercare following joint replacement surgery: Secondary | ICD-10-CM | POA: Diagnosis not present

## 2017-06-03 DIAGNOSIS — Z96642 Presence of left artificial hip joint: Secondary | ICD-10-CM | POA: Diagnosis not present

## 2017-06-03 DIAGNOSIS — M25552 Pain in left hip: Secondary | ICD-10-CM | POA: Diagnosis not present

## 2017-06-08 DIAGNOSIS — L719 Rosacea, unspecified: Secondary | ICD-10-CM | POA: Diagnosis not present

## 2017-06-08 DIAGNOSIS — D229 Melanocytic nevi, unspecified: Secondary | ICD-10-CM | POA: Diagnosis not present

## 2017-06-08 DIAGNOSIS — L570 Actinic keratosis: Secondary | ICD-10-CM | POA: Diagnosis not present

## 2017-08-16 DIAGNOSIS — M545 Low back pain: Secondary | ICD-10-CM | POA: Diagnosis not present

## 2017-08-17 DIAGNOSIS — Z0000 Encounter for general adult medical examination without abnormal findings: Secondary | ICD-10-CM | POA: Diagnosis not present

## 2017-08-17 DIAGNOSIS — E039 Hypothyroidism, unspecified: Secondary | ICD-10-CM | POA: Diagnosis not present

## 2017-08-19 DIAGNOSIS — M545 Low back pain: Secondary | ICD-10-CM | POA: Diagnosis not present

## 2017-08-19 DIAGNOSIS — Z6830 Body mass index (BMI) 30.0-30.9, adult: Secondary | ICD-10-CM | POA: Diagnosis not present

## 2017-08-19 DIAGNOSIS — Z712 Person consulting for explanation of examination or test findings: Secondary | ICD-10-CM | POA: Diagnosis not present

## 2017-08-19 DIAGNOSIS — E039 Hypothyroidism, unspecified: Secondary | ICD-10-CM | POA: Diagnosis not present

## 2017-09-02 DIAGNOSIS — Z96642 Presence of left artificial hip joint: Secondary | ICD-10-CM | POA: Diagnosis not present

## 2017-09-02 DIAGNOSIS — Z6831 Body mass index (BMI) 31.0-31.9, adult: Secondary | ICD-10-CM | POA: Diagnosis not present

## 2017-09-02 DIAGNOSIS — Z471 Aftercare following joint replacement surgery: Secondary | ICD-10-CM | POA: Diagnosis not present

## 2017-09-02 DIAGNOSIS — M545 Low back pain: Secondary | ICD-10-CM | POA: Diagnosis not present

## 2017-10-04 DIAGNOSIS — I350 Nonrheumatic aortic (valve) stenosis: Secondary | ICD-10-CM | POA: Diagnosis not present

## 2017-10-04 DIAGNOSIS — Z471 Aftercare following joint replacement surgery: Secondary | ICD-10-CM | POA: Diagnosis not present

## 2017-10-04 DIAGNOSIS — E039 Hypothyroidism, unspecified: Secondary | ICD-10-CM | POA: Diagnosis not present

## 2017-10-04 DIAGNOSIS — Z96642 Presence of left artificial hip joint: Secondary | ICD-10-CM | POA: Diagnosis not present

## 2017-10-08 DIAGNOSIS — E039 Hypothyroidism, unspecified: Secondary | ICD-10-CM | POA: Diagnosis not present

## 2017-10-08 DIAGNOSIS — Z6831 Body mass index (BMI) 31.0-31.9, adult: Secondary | ICD-10-CM | POA: Diagnosis not present

## 2017-10-08 DIAGNOSIS — R252 Cramp and spasm: Secondary | ICD-10-CM | POA: Diagnosis not present

## 2017-12-06 DIAGNOSIS — L219 Seborrheic dermatitis, unspecified: Secondary | ICD-10-CM | POA: Diagnosis not present

## 2017-12-06 DIAGNOSIS — R252 Cramp and spasm: Secondary | ICD-10-CM | POA: Diagnosis not present

## 2017-12-06 DIAGNOSIS — R1032 Left lower quadrant pain: Secondary | ICD-10-CM | POA: Diagnosis not present

## 2017-12-06 DIAGNOSIS — M545 Low back pain: Secondary | ICD-10-CM | POA: Diagnosis not present

## 2017-12-06 DIAGNOSIS — N50812 Left testicular pain: Secondary | ICD-10-CM | POA: Diagnosis not present

## 2017-12-07 ENCOUNTER — Ambulatory Visit
Admission: RE | Admit: 2017-12-07 | Discharge: 2017-12-07 | Disposition: A | Discharge status: Home / Self Care | Payer: Medicare Other | Source: Ambulatory Visit | Attending: Family Medicine | Admitting: Family Medicine

## 2017-12-07 ENCOUNTER — Other Ambulatory Visit: Payer: Self-pay | Admitting: Family Medicine

## 2017-12-07 DIAGNOSIS — M545 Low back pain, unspecified: Secondary | ICD-10-CM

## 2017-12-07 DIAGNOSIS — M48061 Spinal stenosis, lumbar region without neurogenic claudication: Secondary | ICD-10-CM | POA: Diagnosis not present

## 2017-12-10 ENCOUNTER — Other Ambulatory Visit (HOSPITAL_COMMUNITY)
Admission: AD | Admit: 2017-12-10 | Discharge: 2017-12-10 | Disposition: A | Discharge status: Home / Self Care | Payer: Medicare Other | Source: Other Acute Inpatient Hospital | Attending: Urology | Admitting: Urology

## 2017-12-10 ENCOUNTER — Ambulatory Visit: Payer: Medicare Other | Admitting: Urology

## 2017-12-10 DIAGNOSIS — N200 Calculus of kidney: Secondary | ICD-10-CM

## 2017-12-10 DIAGNOSIS — N50812 Left testicular pain: Secondary | ICD-10-CM

## 2017-12-10 DIAGNOSIS — R102 Pelvic and perineal pain: Secondary | ICD-10-CM

## 2017-12-10 DIAGNOSIS — R3121 Asymptomatic microscopic hematuria: Secondary | ICD-10-CM | POA: Insufficient documentation

## 2017-12-10 LAB — URINALYSIS, COMPLETE (UACMP) WITH MICROSCOPIC
Bacteria, UA: NONE SEEN
Bilirubin Urine: NEGATIVE
GLUCOSE, UA: NEGATIVE mg/dL
KETONES UR: NEGATIVE mg/dL
LEUKOCYTES UA: NEGATIVE
NITRITE: NEGATIVE
PH: 5 (ref 5.0–8.0)
Protein, ur: NEGATIVE mg/dL
Specific Gravity, Urine: 1.02 (ref 1.005–1.030)

## 2017-12-14 ENCOUNTER — Other Ambulatory Visit: Payer: Self-pay | Admitting: Urology

## 2017-12-14 DIAGNOSIS — N200 Calculus of kidney: Secondary | ICD-10-CM

## 2017-12-14 DIAGNOSIS — R3121 Asymptomatic microscopic hematuria: Secondary | ICD-10-CM

## 2017-12-14 DIAGNOSIS — N50812 Left testicular pain: Secondary | ICD-10-CM

## 2017-12-31 ENCOUNTER — Ambulatory Visit (HOSPITAL_COMMUNITY)
Admission: RE | Admit: 2017-12-31 | Discharge: 2017-12-31 | Disposition: A | Discharge status: Home / Self Care | Payer: Medicare Other | Source: Ambulatory Visit | Attending: Urology | Admitting: Urology

## 2017-12-31 DIAGNOSIS — R3121 Asymptomatic microscopic hematuria: Secondary | ICD-10-CM | POA: Diagnosis not present

## 2017-12-31 DIAGNOSIS — N433 Hydrocele, unspecified: Secondary | ICD-10-CM | POA: Diagnosis not present

## 2017-12-31 DIAGNOSIS — N50812 Left testicular pain: Secondary | ICD-10-CM | POA: Diagnosis not present

## 2017-12-31 DIAGNOSIS — N200 Calculus of kidney: Secondary | ICD-10-CM | POA: Diagnosis not present

## 2017-12-31 LAB — POCT I-STAT CREATININE: Creatinine, Ser: 1.1 mg/dL (ref 0.61–1.24)

## 2017-12-31 MED ORDER — IOPAMIDOL (ISOVUE-300) INJECTION 61%
150.0000 mL | Freq: Once | INTRAVENOUS | Status: AC | PRN
  Administered 2017-12-31: 150 mL via INTRAVENOUS

## 2017-12-31 MED ORDER — IOPAMIDOL (ISOVUE-300) INJECTION 61%: 100.0000 mL | Freq: Once | INTRAVENOUS | Status: DC | PRN

## 2018-02-04 ENCOUNTER — Ambulatory Visit: Payer: Medicare Other | Admitting: Urology

## 2018-02-04 DIAGNOSIS — K4020 Bilateral inguinal hernia, without obstruction or gangrene, not specified as recurrent: Secondary | ICD-10-CM | POA: Diagnosis not present

## 2018-02-04 DIAGNOSIS — N50812 Left testicular pain: Secondary | ICD-10-CM

## 2018-02-04 DIAGNOSIS — N200 Calculus of kidney: Secondary | ICD-10-CM | POA: Diagnosis not present

## 2018-02-04 DIAGNOSIS — R3121 Asymptomatic microscopic hematuria: Secondary | ICD-10-CM

## 2018-02-08 DIAGNOSIS — E039 Hypothyroidism, unspecified: Secondary | ICD-10-CM | POA: Diagnosis not present

## 2018-02-08 DIAGNOSIS — M545 Low back pain: Secondary | ICD-10-CM | POA: Diagnosis not present

## 2018-02-11 DIAGNOSIS — Z8679 Personal history of other diseases of the circulatory system: Secondary | ICD-10-CM | POA: Diagnosis not present

## 2018-02-11 DIAGNOSIS — Z96642 Presence of left artificial hip joint: Secondary | ICD-10-CM | POA: Diagnosis not present

## 2018-02-11 DIAGNOSIS — M1612 Unilateral primary osteoarthritis, left hip: Secondary | ICD-10-CM | POA: Diagnosis not present

## 2018-02-16 DIAGNOSIS — K4021 Bilateral inguinal hernia, without obstruction or gangrene, recurrent: Secondary | ICD-10-CM | POA: Diagnosis not present

## 2018-02-23 ENCOUNTER — Encounter (HOSPITAL_COMMUNITY): Payer: Self-pay

## 2018-02-25 ENCOUNTER — Other Ambulatory Visit: Payer: Self-pay

## 2018-02-25 ENCOUNTER — Encounter (HOSPITAL_COMMUNITY)
Admission: RE | Admit: 2018-02-25 | Discharge: 2018-02-25 | Disposition: A | Discharge status: Home / Self Care | Payer: Medicare Other | Source: Ambulatory Visit | Attending: Surgery | Admitting: Surgery

## 2018-02-25 ENCOUNTER — Encounter (HOSPITAL_COMMUNITY): Payer: Self-pay

## 2018-02-25 DIAGNOSIS — Z01818 Encounter for other preprocedural examination: Secondary | ICD-10-CM | POA: Diagnosis not present

## 2018-02-25 DIAGNOSIS — R9431 Abnormal electrocardiogram [ECG] [EKG]: Secondary | ICD-10-CM | POA: Insufficient documentation

## 2018-02-25 DIAGNOSIS — K4090 Unilateral inguinal hernia, without obstruction or gangrene, not specified as recurrent: Secondary | ICD-10-CM | POA: Insufficient documentation

## 2018-02-25 DIAGNOSIS — I517 Cardiomegaly: Secondary | ICD-10-CM | POA: Diagnosis not present

## 2018-02-25 HISTORY — DX: Personal history of other diseases of the circulatory system: Z86.79

## 2018-02-25 HISTORY — DX: Nonrheumatic mitral (valve) insufficiency: I34.0

## 2018-02-25 HISTORY — DX: Hypothyroidism, unspecified: E03.9

## 2018-02-25 HISTORY — DX: Obesity, unspecified: E66.9

## 2018-02-25 HISTORY — DX: Personal history of Methicillin resistant Staphylococcus aureus infection: Z86.14

## 2018-02-25 HISTORY — DX: Personal history of pulmonary embolism: Z86.711

## 2018-02-25 HISTORY — DX: Cardiac murmur, unspecified: R01.1

## 2018-02-25 HISTORY — DX: Atherosclerotic heart disease of native coronary artery without angina pectoris: I25.10

## 2018-02-25 HISTORY — DX: Anemia, unspecified: D64.9

## 2018-02-25 HISTORY — DX: Personal history of urinary calculi: Z87.442

## 2018-02-25 HISTORY — DX: Gastro-esophageal reflux disease without esophagitis: K21.9

## 2018-02-25 HISTORY — DX: Localized edema: R60.0

## 2018-02-25 HISTORY — DX: Nonrheumatic aortic (valve) stenosis: I35.0

## 2018-02-25 HISTORY — DX: Rheumatic tricuspid insufficiency: I07.1

## 2018-02-25 HISTORY — DX: Cardiomegaly: I51.7

## 2018-02-25 LAB — CBC
HCT: 41.8 % (ref 39.0–52.0)
Hemoglobin: 12.8 g/dL — ABNORMAL LOW (ref 13.0–17.0)
MCH: 27.7 pg (ref 26.0–34.0)
MCHC: 30.6 g/dL (ref 30.0–36.0)
MCV: 90.5 fL (ref 80.0–100.0)
PLATELETS: 177 10*3/uL (ref 150–400)
RBC: 4.62 MIL/uL (ref 4.22–5.81)
RDW: 13.4 % (ref 11.5–15.5)
WBC: 4.6 10*3/uL (ref 4.0–10.5)
nRBC: 0 % (ref 0.0–0.2)

## 2018-02-25 LAB — SURGICAL PCR SCREEN
MRSA, PCR: NEGATIVE
Staphylococcus aureus: NEGATIVE

## 2018-02-25 NOTE — Patient Instructions (Signed)
Your procedure is scheduled on: Tuesday, Feb. 4, 2020   Surgery Time:  9:13AM-10:43AM   Report to [facility]  Entrance    Report to admitting at 7:13 AM   Call this number if you have problems the morning of surgery [phone]   Do not eat food or drink liquids :After Midnight.   Brush your teeth the morning of surgery.   Do NOT smoke after Midnight   Take these medicines the morning of surgery with A SIP OF WATER: Levothyroxine                               You may not have any metal on your body including jewelry, and body piercings             Do not wear lotions, powders, perfumes/cologne, or deodorant                           Men may shave face and neck.   Do not bring valuables to the hospital. [place].   Contacts, dentures or bridgework may not be worn into surgery.    Patients discharged the day of surgery will not be allowed to drive home.   Special Instructions: Bring a copy of your healthcare power of attorney and living will documents         the day of surgery if you haven't scanned them in before.              Please read over the following fact sheets you were given:  [facility] - Preparing for Surgery Before surgery, you can play an important role.  Because skin is not sterile, your skin needs to be as free of germs as possible.  You can reduce the number of germs on your skin by washing with CHG (chlorahexidine gluconate) soap before surgery.  CHG is an antiseptic cleaner which kills germs and bonds with the skin to continue killing germs even after washing. Please DO NOT use if you have an allergy to CHG or antibacterial soaps.  If your skin becomes reddened/irritated stop using the CHG and inform your nurse when you arrive at Short Stay. Do not shave (including legs and underarms) for at least 48 hours prior to the first CHG shower.  You may shave your face/neck.  Please follow these  instructions carefully:  1.  Shower with CHG Soap the night before surgery and the  morning of surgery.  2.  If you choose to wash your hair, wash your hair first as usual with your normal  shampoo.  3.  After you shampoo, rinse your hair and body thoroughly to remove the shampoo.                             4.  Use CHG as you would any other liquid soap.  You can apply chg directly to the skin and wash.  Gently with a scrungie or clean washcloth.  5.  Apply the CHG Soap to your body ONLY FROM THE NECK DOWN.   Do   not use on face/ open  Wound or open sores. Avoid contact with eyes, ears mouth and   genitals (private parts).                       Wash face,  Genitals (private parts) with your normal soap.             6.  Wash thoroughly, paying special attention to the area where your    surgery  will be performed.  7.  Thoroughly rinse your body with warm water from the neck down.  8.  DO NOT shower/wash with your normal soap after using and rinsing off the CHG Soap.                9.  Pat yourself dry with a clean towel.            10.  Wear clean pajamas.            11.  Place clean sheets on your bed the night of your first shower and do not  sleep with pets. Day of Surgery : Do not apply any lotions/deodorants the morning of surgery.  Please wear clean clothes to the hospital/surgery center.  FAILURE TO FOLLOW THESE INSTRUCTIONS MAY RESULT IN THE CANCELLATION OF YOUR SURGERY  PATIENT SIGNATURE_________________________________  NURSE SIGNATURE__________________________________  ________________________________________________________________________

## 2018-02-28 MED ORDER — BUPIVACAINE LIPOSOME 1.3 % IJ SUSP
20.0000 mL | Freq: Once | INTRAMUSCULAR | Status: DC
  Filled 2018-02-28: qty 20

## 2018-02-28 NOTE — Progress Notes (Signed)
Anesthesia Chart Review   Case:  [id number] Date/Time:  03/01/18 0858   Procedure:  LAPAROSCOPIC TEP BILATERAL INGUINAL HERNIA REPAIR (N/A )   Anesthesia type:  General   Pre-op diagnosis:  BILATERAL INGUINAL HERNIA   Location:  [facility] [facility] / [facility]   Surgeon:  Johnathan Hausen, MD      DISCUSSION: 78 yo never smoker with h/o hypothyroidism, moderate aortic valve stenosis, GERD, CAD, bilateral inguinal hernia scheduled for above surgery on 03/01/2018 with Dr. Johnathan Hausen.   Pt was last seen by cardiology on 04/09/17 prior to hip replacement surgery.  Seen by Dr. Ernesta Amble.  Per his note (On Care Everywhere) pt h/o moderate aortic stenosis with mean gradient on Echo of 20-12mmHg, preserved EF 55%.  Pt asymptomatic at this time and Dr. Tobe Sos cleared him for hip surgery stating he was low cardiac risk. No complications with hip surgery on 04/14/17.   Pt can proceed with planned procedure barring acute status change.  VS: BP 117/77   Pulse 76   Temp 36.6 C (Oral)   Resp 16   Ht 5\' 10"  (1.778 m)   Wt 100.2 kg   SpO2 99%   BMI 31.71 kg/m   PROVIDERS: Antony Contras, MD is PCP   Ernesta Amble, MD is Cardiologist  LABS: Labs reviewed: Acceptable for surgery. (all labs ordered are listed, but only abnormal results are displayed)  Labs Reviewed  CBC - Abnormal; Notable for the following components:      Result Value   Hemoglobin 12.8 (*)    All other components within normal limits  SURGICAL PCR SCREEN     IMAGES: CT Abdomen Pelvis 12/31/17 IMPRESSION: 1. Left nephrolithiasis.  No other explanation for hematuria. 2. Chronic small right pleural effusion with right base opacity, strongly favored to represent rounded atelectasis. Given development since 04/19/2016, consider chest CT follow-up at 3-6 months. 3. Right-sided retroperitoneal soft tissue thickening, at the site of fluid collection on the prior exam. 4. Bilateral inguinal hernias. The right inguinal hernia  contains nonobstructive small bowel. The sigmoid colon is positioned at the entrance to the left inguinal hernia. 5. Jejunal mesenteric findings which may represent mesenteric adenitis/panniculitis. Of doubtful clinical significance. 6. Degraded evaluation of the pelvis, secondary to beam hardening artifact from left hip arthroplasty. 7.  Aortic Atherosclerosis (ICD10-I70.0). 8. Aortic valvular calcifications. Consider echocardiography to evaluate for valvular dysfunction.  EKG: 02/25/2018 Rate 76 bpm  Normal sinus rhythm Left ventricular hypertrophy Abnormal ECG No significant change since last tracing   CV: Echo 04/02/17 (on Care Everywhere) INTERPRETATION NORMAL LV SYSTOLIC FUNCTION MODERATE TO SEVERE AORTIC VALVE STENOSIS WITH MEAN GRADIENT OF 20-25MMHG, BUT DI OF 0.21 -- VISUALLY SEVERE STENOSIS NO OTHER SIGNIFICANT VALVULAR DYSFUNCTION NORMAL RV SIZE AND FUNCTION TECHNICALY DIFICULT STUDY DEFINITY WAS USED TO ENHANCE THE ENDOCARDIAL BORDERS NO PRIOR FOR COMPARISON Past Medical History:  Diagnosis Date  . Acute pancreatitis 05/2015   at [place] for 8 weeks  . Anemia 2017   History of  . Aortic valve stenosis 04/02/2017   MODERATE TO SEVERE AORTIC VALVE STENOSIS WITH MEAN GRADIENT OF 20-25MMHG, BUT DI OF 0.21  VISUALLY SEVERE STENOSIS, noted on ECHO  . Arthritis   . Atherosclerotic coronary vascular disease 05/07/2011   Minimal nonobstructive atherosclerotic coronary disease  . Bilateral lower extremity edema    pt denies  . GERD (gastroesophageal reflux disease)   . Gout   . Heart murmur   . History of atrial fibrillation 2013  one time  . History of kidney stones   . History of MRSA infection   . History of pulmonary embolism 2017   pt denied  . Hypothyroidism   . LVH (left ventricular hypertrophy) 04/02/2017   Noted on EKG  . MR (mitral regurgitation)    Mild, noted on ECHO  . Obese   . TR (tricuspid regurgitation)    Mild, noted on ECHO     Past Surgical History:  Procedure Laterality Date  . BACK SURGERY    . CHOLECYSTECTOMY    . COLONOSCOPY  07/08/2011   Procedure: COLONOSCOPY;  Surgeon: Rogene Houston, MD;  Location: [facility];  Service: Endoscopy;  Laterality: N/A;  930  . DUODENOTOMY  09/29/2016   repair of fistula, jejunal resection  . JEJUNOSTOMY FEEDING TUBE    . KNEE ARTHROSCOPY Left   . LEFT HEART CATHETERIZATION WITH CORONARY ANGIOGRAM N/A 05/07/2011   Procedure: LEFT HEART CATHETERIZATION WITH CORONARY ANGIOGRAM;  Surgeon: Peter M Martinique, MD;  Location: [facility] CATH LAB;  Service: Cardiovascular;  Laterality: N/A;  . PEG TUBE PLACEMENT    . VASECTOMY      MEDICATIONS: . acetaminophen (TYLENOL) 500 MG tablet  . levothyroxine (SYNTHROID, LEVOTHROID) 112 MCG tablet   No current facility-administered medications for this encounter.    Derrill Memo ON 03/01/2018] bupivacaine liposome (EXPAREL) 1.3 % injection 266 mg   Maia Plan [facility] Pre-Surgical Testing [phone] 02/28/18 12:13 PM

## 2018-02-28 NOTE — Anesthesia Preprocedure Evaluation (Addendum)
Anesthesia Evaluation  Patient identified by MRN, date of birth, ID band Patient awake    Reviewed: Allergy & Precautions, NPO status , Patient's Chart, lab work & pertinent test results  Airway Mallampati: II  TM Distance: >3 FB Neck ROM: Full    Dental no notable dental hx.    Pulmonary neg pulmonary ROS,    Pulmonary exam normal breath sounds clear to auscultation       Cardiovascular + Valvular Problems/Murmurs AS and MR  Rhythm:Regular Rate:Normal + Systolic murmurs    Neuro/Psych negative neurological ROS  negative psych ROS   GI/Hepatic Neg liver ROS, GERD  Medicated,  Endo/Other  Hypothyroidism   Renal/GU negative Renal ROS  negative genitourinary   Musculoskeletal negative musculoskeletal ROS (+)   Abdominal   Peds negative pediatric ROS (+)  Hematology negative hematology ROS (+)   Anesthesia Other Findings   Reproductive/Obstetrics negative OB ROS                            Anesthesia Physical Anesthesia Plan  ASA: III  Anesthesia Plan: General   Post-op Pain Management:    Induction: Intravenous  PONV Risk Score and Plan: 2 and Ondansetron, Dexamethasone and Treatment may vary due to age or medical condition  Airway Management Planned: Oral ETT  Additional Equipment:   Intra-op Plan:   Post-operative Plan: Extubation in OR  Informed Consent: I have reviewed the patients History and Physical, chart, labs and discussed the procedure including the risks, benefits and alternatives for the proposed anesthesia with the patient or authorized representative who has indicated his/her understanding and acceptance.     Dental advisory given  Plan Discussed with: CRNA and Surgeon  Anesthesia Plan Comments: (See PST note 02/25/2018, Konrad Felix, PA-C)       Anesthesia Quick Evaluation

## 2018-02-28 NOTE — H&P (Signed)
Chief Complaint:  Painful left inguinal hernia and right inguinal hernia  History of Present Illness:  Andrew Watson is an 78 y.o. male presented to the [facility] office having been referred by Dr. Jeffie Pollock with left inguina pain.  He has a recurrent LIH containing sigmoid and RIH containing small bowel.  He has had a prior anterior hip replacement on the left and has been seen by his doctor at [facility] about this and he didn't think that this was the cause of his pain.  Informed consent obtained regarding lap and open inguinal hernia repair.  Will approach these herniae with a TEP approach   Past Medical History:  Diagnosis Date  . Acute pancreatitis 05/2015   at [place] for 8 weeks  . Anemia 2017   History of  . Aortic valve stenosis 04/02/2017   MODERATE TO SEVERE AORTIC VALVE STENOSIS WITH MEAN GRADIENT OF 20-25MMHG, BUT DI OF 0.21  VISUALLY SEVERE STENOSIS, noted on ECHO  . Arthritis   . Atherosclerotic coronary vascular disease 05/07/2011   Minimal nonobstructive atherosclerotic coronary disease  . Bilateral lower extremity edema    pt denies  . GERD (gastroesophageal reflux disease)   . Gout   . Heart murmur   . History of atrial fibrillation 2013   one time  . History of kidney stones   . History of MRSA infection   . History of pulmonary embolism 2017   pt denied  . Hypothyroidism   . LVH (left ventricular hypertrophy) 04/02/2017   Noted on EKG  . MR (mitral regurgitation)    Mild, noted on ECHO  . Obese   . TR (tricuspid regurgitation)    Mild, noted on ECHO    Past Surgical History:  Procedure Laterality Date  . BACK SURGERY    . CHOLECYSTECTOMY    . COLONOSCOPY  07/08/2011   Procedure: COLONOSCOPY;  Surgeon: Rogene Houston, MD;  [facility];  Service: Endoscopy;  Laterality: N/A;  930  . DUODENOTOMY  09/29/2016   repair of fistula, jejunal resection  . JEJUNOSTOMY FEEDING TUBE    . KNEE ARTHROSCOPY Left   . LEFT HEART CATHETERIZATION WITH  CORONARY ANGIOGRAM N/A 05/07/2011   Procedure: LEFT HEART CATHETERIZATION WITH CORONARY ANGIOGRAM;  Surgeon: Peter M Martinique, MD;  Location: [facility] CATH LAB;  Service: Cardiovascular;  Laterality: N/A;  . PEG TUBE PLACEMENT    . VASECTOMY      Current Facility-Administered Medications  Medication Dose Route Frequency Provider Last Rate Last Dose  . [START ON 03/01/2018] bupivacaine liposome (EXPAREL) 1.3 % injection 266 mg  20 mL Infiltration Once Johnathan Hausen, MD       Current Outpatient Medications  Medication Sig Dispense Refill  . acetaminophen (TYLENOL) 500 MG tablet Take 1,000 mg by mouth every 6 (six) hours as needed for moderate pain or headache.    . levothyroxine (SYNTHROID, LEVOTHROID) 112 MCG tablet Take 224 mcg by mouth daily before breakfast.      Patient has no known allergies. No family history on file. Social History:   reports that he has never smoked. He has never used smokeless tobacco. He reports that he does not drink alcohol or use drugs.   REVIEW OF SYSTEMS : Negative except for see problem list  Physical Exam:   There were no vitals taken for this visit. There is no height or weight on file to calculate BMI.  Gen:  WDWN WM NAD  Neurological: Alert and oriented to person, place,  and time. Motor and sensory function is grossly intact  Head: Normocephalic and atraumatic.  Eyes: Conjunctivae are normal. Pupils are equal, round, and reactive to light. No scleral icterus.  Neck: Normal range of motion. Neck supple. No tracheal deviation or thyromegaly present.  Cardiovascular:  SR without murmurs or gallops.  No carotid bruits Breast:  Not examined Respiratory: Effort normal.  No respiratory distress. No chest wall tenderness. Breath sounds normal.  No wheezes, rales or rhonchi.  Abdomen:  portuburant with upper abdominal scars from pancreatitis3 GU:  Bilateral inguinal herniae complaining of more pain on the left Musculoskeletal: Normal range of motion. Extremities  are nontender. No cyanosis, edema or clubbing noted Lymphadenopathy: No cervical, preauricular, postauricular or axillary adenopathy is present Skin: Skin is warm and dry. No rash noted. No diaphoresis. No erythema. No pallor. Pscyh: Normal mood and affect. Behavior is normal. Judgment and thought content normal.   LABORATORY RESULTS: No results found for this or any previous visit (from the past 48 hour(s)).   RADIOLOGY RESULTS: No results found.  Problem List: Patient Active Problem List   Diagnosis Date Noted  . Retroperitoneal fluid collection 04/20/2016  . Hypotension 11/24/2015  . History of pancreatitis 11/24/2015  . Jejunostomy tube in situ ([place]) 11/24/2015  . Volume depletion 11/24/2015  . Pancreatitis 08/12/2015  . Cholangitis 08/12/2015  . Pulmonary emboli ([place]) 08/12/2015  . Duodenal stricture 08/10/2015  . Gastric outlet obstruction 08/10/2015  . H/O bypass gastrojejunostomy 08/10/2015  . Pancreatic pseudocyst 08/01/2015  . Alcohol use 07/03/2015  . Atrial fibrillation ([place]) 07/03/2015  . Acute pancreatitis 06/26/2015  . Insomnia 05/15/2011  . Pericarditis 05/09/2011  . Hypothyroidism 05/08/2011    Assessment & Plan: Recurrent bilateral inguinal herniae with pain on the left.  TEP or open inguinal hernia repair    Matt B. Hassell Done, MD, [facility] [phone] beeper [phone]  02/28/2018 1:03 PM

## 2018-03-01 ENCOUNTER — Ambulatory Visit (HOSPITAL_COMMUNITY): Payer: Medicare Other | Admitting: Anesthesiology

## 2018-03-01 ENCOUNTER — Encounter (HOSPITAL_COMMUNITY): Payer: Self-pay

## 2018-03-01 ENCOUNTER — Ambulatory Visit (HOSPITAL_COMMUNITY)
Admission: RE | Admit: 2018-03-01 | Discharge: 2018-03-01 | Disposition: A | Discharge status: Home / Self Care | Payer: Medicare Other | Attending: Surgery | Admitting: Surgery

## 2018-03-01 ENCOUNTER — Ambulatory Visit (HOSPITAL_COMMUNITY): Payer: Medicare Other | Admitting: Physician Assistant

## 2018-03-01 ENCOUNTER — Encounter (HOSPITAL_COMMUNITY)
Admission: RE | Disposition: A | Discharge status: Home / Self Care | Payer: Self-pay | Source: Home / Self Care | Attending: Surgery

## 2018-03-01 DIAGNOSIS — Z8614 Personal history of Methicillin resistant Staphylococcus aureus infection: Secondary | ICD-10-CM | POA: Diagnosis not present

## 2018-03-01 DIAGNOSIS — Z87442 Personal history of urinary calculi: Secondary | ICD-10-CM | POA: Insufficient documentation

## 2018-03-01 DIAGNOSIS — K4030 Unilateral inguinal hernia, with obstruction, without gangrene, not specified as recurrent: Secondary | ICD-10-CM | POA: Diagnosis not present

## 2018-03-01 DIAGNOSIS — Z79890 Hormone replacement therapy (postmenopausal): Secondary | ICD-10-CM | POA: Insufficient documentation

## 2018-03-01 DIAGNOSIS — Z6831 Body mass index (BMI) 31.0-31.9, adult: Secondary | ICD-10-CM | POA: Insufficient documentation

## 2018-03-01 DIAGNOSIS — I4891 Unspecified atrial fibrillation: Secondary | ICD-10-CM | POA: Insufficient documentation

## 2018-03-01 DIAGNOSIS — M1990 Unspecified osteoarthritis, unspecified site: Secondary | ICD-10-CM | POA: Insufficient documentation

## 2018-03-01 DIAGNOSIS — I2510 Atherosclerotic heart disease of native coronary artery without angina pectoris: Secondary | ICD-10-CM | POA: Insufficient documentation

## 2018-03-01 DIAGNOSIS — K4090 Unilateral inguinal hernia, without obstruction or gangrene, not specified as recurrent: Secondary | ICD-10-CM | POA: Diagnosis not present

## 2018-03-01 DIAGNOSIS — E669 Obesity, unspecified: Secondary | ICD-10-CM | POA: Diagnosis not present

## 2018-03-01 DIAGNOSIS — Z8719 Personal history of other diseases of the digestive system: Secondary | ICD-10-CM

## 2018-03-01 DIAGNOSIS — E039 Hypothyroidism, unspecified: Secondary | ICD-10-CM | POA: Diagnosis not present

## 2018-03-01 DIAGNOSIS — K219 Gastro-esophageal reflux disease without esophagitis: Secondary | ICD-10-CM | POA: Diagnosis not present

## 2018-03-01 DIAGNOSIS — K4020 Bilateral inguinal hernia, without obstruction or gangrene, not specified as recurrent: Secondary | ICD-10-CM | POA: Diagnosis not present

## 2018-03-01 DIAGNOSIS — Z9889 Other specified postprocedural states: Secondary | ICD-10-CM

## 2018-03-01 HISTORY — PX: INGUINAL HERNIA REPAIR: SHX194

## 2018-03-01 SURGERY — REPAIR, HERNIA, INGUINAL, BILATERAL, LAPAROSCOPIC
Anesthesia: General

## 2018-03-01 MED ORDER — OXYCODONE HCL 5 MG PO TABS
ORAL_TABLET | ORAL | Status: AC
  Filled 2018-03-01: qty 1

## 2018-03-01 MED ORDER — FENTANYL CITRATE (PF) 100 MCG/2ML IJ SOLN
INTRAMUSCULAR | Status: DC | PRN
  Administered 2018-03-01 (×2): 50 ug via INTRAVENOUS
  Administered 2018-03-01: 100 ug via INTRAVENOUS

## 2018-03-01 MED ORDER — PHENYLEPHRINE 40 MCG/ML (10ML) SYRINGE FOR IV PUSH (FOR BLOOD PRESSURE SUPPORT)
PREFILLED_SYRINGE | INTRAVENOUS | Status: AC
  Filled 2018-03-01: qty 10

## 2018-03-01 MED ORDER — SUGAMMADEX SODIUM 500 MG/5ML IV SOLN
INTRAVENOUS | Status: DC | PRN
  Administered 2018-03-01: 400 mg via INTRAVENOUS

## 2018-03-01 MED ORDER — BUPIVACAINE LIPOSOME 1.3 % IJ SUSP
INTRAMUSCULAR | Status: DC | PRN
  Administered 2018-03-01: 20 mL

## 2018-03-01 MED ORDER — PROMETHAZINE HCL 25 MG/ML IJ SOLN: 6.2500 mg | INTRAMUSCULAR | Status: DC | PRN

## 2018-03-01 MED ORDER — FENTANYL CITRATE (PF) 100 MCG/2ML IJ SOLN
INTRAMUSCULAR | Status: AC
  Filled 2018-03-01: qty 2

## 2018-03-01 MED ORDER — CHLORHEXIDINE GLUCONATE CLOTH 2 % EX PADS: 6.0000 | MEDICATED_PAD | Freq: Once | CUTANEOUS | Status: DC

## 2018-03-01 MED ORDER — HYDROCODONE-ACETAMINOPHEN 5-325 MG PO TABS: 1.0000 | ORAL_TABLET | Freq: Four times a day (QID) | ORAL | 0 refills | Status: DC | PRN

## 2018-03-01 MED ORDER — OXYCODONE HCL 5 MG/5ML PO SOLN
5.0000 mg | Freq: Once | ORAL | Status: AC | PRN
  Filled 2018-03-01: qty 5

## 2018-03-01 MED ORDER — FENTANYL CITRATE (PF) 250 MCG/5ML IJ SOLN
INTRAMUSCULAR | Status: AC
  Filled 2018-03-01: qty 5

## 2018-03-01 MED ORDER — LACTATED RINGERS IR SOLN
Status: DC | PRN
  Administered 2018-03-01: 1000 mL

## 2018-03-01 MED ORDER — LIDOCAINE HCL (CARDIAC) PF 100 MG/5ML IV SOSY
PREFILLED_SYRINGE | INTRAVENOUS | Status: DC | PRN
  Administered 2018-03-01: 50 mg via INTRAVENOUS

## 2018-03-01 MED ORDER — PROPOFOL 10 MG/ML IV BOLUS
INTRAVENOUS | Status: DC | PRN
  Administered 2018-03-01: 140 mg via INTRAVENOUS

## 2018-03-01 MED ORDER — SODIUM CHLORIDE 0.9 % IV SOLN
INTRAVENOUS | Status: DC | PRN
  Administered 2018-03-01: 25 ug/min via INTRAVENOUS

## 2018-03-01 MED ORDER — ROCURONIUM BROMIDE 100 MG/10ML IV SOLN
INTRAVENOUS | Status: AC
  Filled 2018-03-01: qty 1

## 2018-03-01 MED ORDER — DEXAMETHASONE SODIUM PHOSPHATE 10 MG/ML IJ SOLN
INTRAMUSCULAR | Status: AC
  Filled 2018-03-01: qty 1

## 2018-03-01 MED ORDER — HEPARIN SODIUM (PORCINE) 5000 UNIT/ML IJ SOLN
5000.0000 [IU] | Freq: Once | INTRAMUSCULAR | Status: AC
  Administered 2018-03-01: 5000 [IU] via SUBCUTANEOUS
  Filled 2018-03-01: qty 1

## 2018-03-01 MED ORDER — ACETAMINOPHEN 500 MG PO TABS
1000.0000 mg | ORAL_TABLET | ORAL | Status: AC
  Administered 2018-03-01: 1000 mg via ORAL
  Filled 2018-03-01: qty 2

## 2018-03-01 MED ORDER — SUGAMMADEX SODIUM 500 MG/5ML IV SOLN
INTRAVENOUS | Status: AC
  Filled 2018-03-01: qty 5

## 2018-03-01 MED ORDER — ONDANSETRON HCL 4 MG/2ML IJ SOLN
INTRAMUSCULAR | Status: AC
  Filled 2018-03-01: qty 2

## 2018-03-01 MED ORDER — LIDOCAINE 2% (20 MG/ML) 5 ML SYRINGE
INTRAMUSCULAR | Status: AC
  Filled 2018-03-01: qty 5

## 2018-03-01 MED ORDER — OXYCODONE HCL 5 MG PO TABS
5.0000 mg | ORAL_TABLET | Freq: Once | ORAL | Status: AC | PRN
  Administered 2018-03-01: 5 mg via ORAL

## 2018-03-01 MED ORDER — PHENYLEPHRINE HCL 10 MG/ML IJ SOLN
INTRAMUSCULAR | Status: DC | PRN
  Administered 2018-03-01: 40 ug via INTRAVENOUS

## 2018-03-01 MED ORDER — CEFAZOLIN SODIUM-DEXTROSE 2-4 GM/100ML-% IV SOLN
2.0000 g | INTRAVENOUS | Status: AC
  Administered 2018-03-01: 2 g via INTRAVENOUS
  Filled 2018-03-01: qty 100

## 2018-03-01 MED ORDER — FENTANYL CITRATE (PF) 100 MCG/2ML IJ SOLN: 25.0000 ug | INTRAMUSCULAR | Status: DC | PRN

## 2018-03-01 MED ORDER — LACTATED RINGERS IV SOLN
INTRAVENOUS | Status: DC
  Administered 2018-03-01: 07:00:00 via INTRAVENOUS

## 2018-03-01 MED ORDER — PROPOFOL 10 MG/ML IV BOLUS
INTRAVENOUS | Status: AC
  Filled 2018-03-01: qty 20

## 2018-03-01 MED ORDER — ONDANSETRON HCL 4 MG/2ML IJ SOLN
INTRAMUSCULAR | Status: DC | PRN
  Administered 2018-03-01 (×2): 4 mg via INTRAVENOUS

## 2018-03-01 MED ORDER — DEXAMETHASONE SODIUM PHOSPHATE 10 MG/ML IJ SOLN
INTRAMUSCULAR | Status: DC | PRN
  Administered 2018-03-01: 10 mg via INTRAVENOUS

## 2018-03-01 MED ORDER — ROCURONIUM BROMIDE 100 MG/10ML IV SOLN
INTRAVENOUS | Status: DC | PRN
  Administered 2018-03-01: 70 mg via INTRAVENOUS
  Administered 2018-03-01 (×5): 10 mg via INTRAVENOUS

## 2018-03-01 MED ORDER — 0.9 % SODIUM CHLORIDE (POUR BTL) OPTIME
TOPICAL | Status: DC | PRN
  Administered 2018-03-01: 500 mL

## 2018-03-01 SURGICAL SUPPLY — 33 items
BENZOIN TINCTURE PRP APPL 2/3 (GAUZE/BANDAGES/DRESSINGS) ×2 IMPLANT
CABLE HIGH FREQUENCY MONO STRZ (ELECTRODE) ×2 IMPLANT
COVER SURGICAL LIGHT HANDLE (MISCELLANEOUS) ×2 IMPLANT
COVER WAND RF STERILE (DRAPES) ×2 IMPLANT
DECANTER SPIKE VIAL GLASS SM (MISCELLANEOUS) IMPLANT
DERMABOND ADVANCED (GAUZE/BANDAGES/DRESSINGS) ×1
DERMABOND ADVANCED .7 DNX12 (GAUZE/BANDAGES/DRESSINGS) ×1 IMPLANT
DEVICE SECURE STRAP 25 ABSORB (INSTRUMENTS) ×2 IMPLANT
DISSECT BALLN SPACEMKR + OVL (BALLOONS) ×2
DISSECTOR BALLN SPACEMKR + OVL (BALLOONS) ×1 IMPLANT
DISSECTOR BLUNT TIP ENDO 5MM (MISCELLANEOUS) ×2 IMPLANT
ELECT PENCIL ROCKER SW 15FT (MISCELLANEOUS) ×2 IMPLANT
ELECT REM PT RETURN 15FT ADLT (MISCELLANEOUS) ×2 IMPLANT
GLOVE BIOGEL M 8.0 STRL (GLOVE) ×2 IMPLANT
GOWN SPEC L4 XLG W/TWL (GOWN DISPOSABLE) ×2 IMPLANT
GOWN STRL REUS W/TWL XL LVL3 (GOWN DISPOSABLE) ×4 IMPLANT
KIT BASIN OR (CUSTOM PROCEDURE TRAY) ×2 IMPLANT
MARKER SKIN DUAL TIP RULER LAB (MISCELLANEOUS) ×2 IMPLANT
MESH 3DMAX 4X6 LT LRG (Mesh General) ×2 IMPLANT
MESH 3DMAX 4X6 RT LRG (Mesh General) ×2 IMPLANT
PROTECTOR NERVE ULNAR (MISCELLANEOUS) IMPLANT
SCISSORS LAP 5X35 DISP (ENDOMECHANICALS) ×2 IMPLANT
SET IRRIG TUBING LAPAROSCOPIC (IRRIGATION / IRRIGATOR) ×2 IMPLANT
SET TUBE SMOKE EVAC HIGH FLOW (TUBING) ×2 IMPLANT
SHEARS HARMONIC ACE PLUS 45CM (MISCELLANEOUS) ×2 IMPLANT
SLEEVE XCEL OPT CAN 5 100 (ENDOMECHANICALS) ×2 IMPLANT
SOLUTION ANTI FOG 6CC (MISCELLANEOUS) ×2 IMPLANT
STRIP CLOSURE SKIN 1/2X4 (GAUZE/BANDAGES/DRESSINGS) IMPLANT
SUT VIC AB 4-0 SH 18 (SUTURE) ×2 IMPLANT
TACKER 5MM HERNIA 3.5CML NAB (ENDOMECHANICALS) IMPLANT
TOWEL OR NON WOVEN STRL DISP B (DISPOSABLE) ×2 IMPLANT
TRAY FOLEY MTR SLVR 16FR STAT (SET/KITS/TRAYS/PACK) IMPLANT
TRAY LAPAROSCOPIC (CUSTOM PROCEDURE TRAY) ×2 IMPLANT

## 2018-03-01 NOTE — Anesthesia Postprocedure Evaluation (Signed)
Anesthesia Post Note  Patient: Andrew Watson  Procedure(s) Performed: LAPAROSCOPIC TEP BILATERAL INGUINAL HERNIA REPAIR (N/A )     Patient location during evaluation: PACU Anesthesia Type: General Level of consciousness: awake and alert Pain management: pain level controlled Vital Signs Assessment: post-procedure vital signs reviewed and stable Respiratory status: spontaneous breathing, nonlabored ventilation, respiratory function stable and patient connected to nasal cannula oxygen Cardiovascular status: blood pressure returned to baseline and stable Postop Assessment: no apparent nausea or vomiting Anesthetic complications: no    Last Vitals:  Vitals:   03/01/18 1245 03/01/18 1300  BP: 121/69 118/67  Pulse: 69 69  Resp: 16 16  Temp:    SpO2: 100% 100%    Last Pain:  Vitals:   03/01/18 1300  TempSrc:   PainSc: 4                  Keshona Kartes S

## 2018-03-01 NOTE — Transfer of Care (Signed)
Immediate Anesthesia Transfer of Care Note  Patient: Andrew Watson  Procedure(s) Performed: LAPAROSCOPIC TEP BILATERAL INGUINAL HERNIA REPAIR (N/A )  Patient Location: PACU  Anesthesia Type:General  Level of Consciousness: awake, alert  and oriented  Airway & Oxygen Therapy: Patient Spontanous Breathing and Patient connected to face mask oxygen  Post-op Assessment: Report given to RN and Post -op Vital signs reviewed and stable  Post vital signs: Reviewed and stable  Last Vitals:  Vitals Value Taken Time  BP 132/74 03/01/2018 12:09 PM  Temp    Pulse 68 03/01/2018 12:09 PM  Resp 14 03/01/2018 12:09 PM  SpO2 100 % 03/01/2018 12:09 PM  Vitals shown include unvalidated device data.  Last Pain:  Vitals:   03/01/18 0731  TempSrc:   PainSc: 0-No pain         Complications: No apparent anesthesia complications

## 2018-03-01 NOTE — Op Note (Signed)
Andrew Watson  [birth year] 01 Mar 2018    PCP:  Antony Contras, MD   Surgeon: Kaylyn Lim, MD, FACS  Asst:  none  Anes:  general  Preop Dx: Bilateral inguinal herniae Postop Dx: Bilateral direct inguinal herniae with left indirect inguinal hernia  Procedure: Lap extraperitoneal bilateral hernia repair with large 3D Max mesh Location Surgery: [facility] 2 Complications: None noted  EBL:   minimal cc  Drains: none  Description of Procedure:  The patient was taken to OR 2 .  After anesthesia was administered and the patient was prepped  with Technicare and a timeout was performed.  Access to the abdomen was achieved by cutting down below the umbilicus 1 to the right side dissecting the rectus and retracting laterally and manually inserting my finger before inserting the balloon dissection catheter.  Patient has had previous robotic robotic duodenotomy and fistula takedown after an nasty bout with pancreatitis in 2017.  I had to work below those trochars.  The patient had a painful left inguinal hernia.  Following a successful balloon did dissection insufflated the preperitoneal space.  I dissected on the right side first and found to a moderate sized direct inguinal hernia and I dissected the cord structures and did not see anything on the right cord.  I did this with the aid of 2 other 5 mm trochars were placed laterally on either side.  On the left side I dissected there was a smaller left direct inguinal hernia with incarcerated preperitoneal fat which I reduced.  Also lateral to the cord and he appeared to have: Stuck up in a and an indirect sac that was stuck to the cord structures.  I stripped this away and in so doing I got a rent in the peritoneum so we did have some gas in the peritoneal cavity.  This however did not slow was too much and I did not place any device above the umbilicus since he had a previous robotic duodenotomy and I didn't know if there were any significant adhesions  involving the bowel.  When the dissections were complete placed a 3D max large mesh on the left side first tacking it medially on the pubis with a secure strap absorbable tacker and then anchored it laterally as well.  A right 3D max was inserted in the overlap slightly and again it was placed out laterally covering the direct defects and went over the cord structures.  Anteriorly it went up to the level of my 5 mm trochars.  One secured I allow the preperitoneal space to decompress along with the peritoneal cavity which I compressed to try to get any extra gas.  Port sites were injected with Exparel and closed with 4-0 Vicryl.  The umbilical port had a sutured single suture of 0 Vicryl placed in the fascia and then into was closed with 4-0 Vicryl and Dermabond.  The patient tolerated the procedure well and was taken to the PACU in stable condition.     Matt B. Hassell Done, [place], [facility], [place]

## 2018-03-01 NOTE — Discharge Instructions (Signed)
Laparoscopic Inguinal Hernia Repair, Adult, Care After  This sheet gives you information about how to care for yourself after your procedure. Your health care provider may also give you more specific instructions. If you have problems or questions, contact your health care provider.  What can I expect after the procedure?  After the procedure, it is common to have:  · Pain.  · Swelling and bruising around the incision area.  · Scrotal swelling, in men.  · Some fluid or blood draining from your incisions.  Follow these instructions at home:  Incision care  · Follow instructions from your health care provider about how to take care of your incisions. Make sure you:  ? Wash your hands with soap and water before you change your bandage (dressing). If soap and water are not available, use hand sanitizer.  ? Change your dressing as told by your health care provider.  ? Leave stitches (sutures), skin glue, or adhesive strips in place. These skin closures may need to stay in place for 2 weeks or longer. If adhesive strip edges start to loosen and curl up, you may trim the loose edges. Do not remove adhesive strips completely unless your health care provider tells you to do that.  · Check your incision area every day for signs of infection. Check for:  ? More redness, swelling, or pain.  ? More fluid or blood.  ? Warmth.  ? Pus or a bad smell.  · Wear loose, soft clothing while your incisions heal.  Driving  · Do not drive or use heavy machinery while taking prescription pain medicine.  · Do not drive for 24 hours if you were given a medicine to help you relax (sedative) during your procedure.  Activity  · Do not lift anything that is heavier than 10 lb (4.5 kg), or the limit that you are told, until your health care provider says that it is safe.  · Ask your health care provider what activities are safe for you. A lot of activity during the first week after surgery can increase pain and swelling. For 1 week after your  procedure:  ? Avoid activities that take a lot of effort, such as exercise or sports.  ? You may walk and climb stairs as needed for daily activity, but avoid long walks or climbing stairs for exercise.  Managing pain and swelling    · Put ice on painful or swollen areas:  ? Put ice in a plastic bag.  ? Place a towel between your skin and the bag.  ? Leave the ice on for 20 minutes, 2-3 times a day.  General instructions  · Do not take baths, swim, or use a hot tub until your health care provider approves. Ask your health care provider if you may take showers. You may only be allowed to take sponge baths.  · Take over-the-counter and prescription medicines only as told by your health care provider.  · To prevent or treat constipation while you are taking prescription pain medicine, your health care provider may recommend that you:  ? Drink enough fluid to keep your urine pale yellow.  ? Take over-the-counter or prescription medicines.  ? Eat foods that are high in fiber, such as fresh fruits and vegetables, whole grains, and beans.  ? Limit foods that are high in fat and processed sugars, such as fried and sweet foods.  · Do not use any products that contain nicotine or tobacco, such as cigarettes and e-cigarettes. If   you need help quitting, ask your health care provider.  · Drink enough fluid to keep your urine pale yellow.  · Keep all follow-up visits as told by your health care provider. This is important.  Contact a health care provider if:  · You have more redness, swelling, or pain around your incisions or your groin area.  · You have more swelling in your scrotum.  · You have more fluid or blood coming from your incisions.  · Your incisions feel warm to the touch.  · You have severe pain and medicines do not help.  · You have abdominal pain or swelling.  · You cannot eat or drink without vomiting.  · You cannot urinate or pass a bowel movement.  · You faint.  · You feel dizzy.  · You have nausea and  vomiting.  · You have a fever.  Get help right away if:  · You have pus or a bad smell coming from your incisions.  · You have chest pain.  · You have problems breathing.  Summary  · Pain, swelling, and bruising are common after the procedure.  · Check your incision area every day for signs of infection, such as more redness, swelling, or pain.  · Put ice on painful or swollen areas for 20 minutes, 2-3 times a day.  This information is not intended to replace advice given to you by your health care provider. Make sure you discuss any questions you have with your health care provider.  Document Released: 04/23/2016 Document Revised: 04/23/2016 Document Reviewed: 04/23/2016  Elsevier Interactive Patient Education © 2019 [employer]

## 2018-03-01 NOTE — Interval H&P Note (Signed)
History and Physical Interval Note:  03/01/2018 9:27 AM  Andrew Watson  has presented today for surgery, with the diagnosis of BILATERAL INGUINAL HERNIA  The various methods of treatment have been discussed with the patient and family. After consideration of risks, benefits and other options for treatment, the patient has consented to  Procedure(s): LAPAROSCOPIC TEP BILATERAL INGUINAL HERNIA REPAIR (N/A) as a surgical intervention .  He understands that we may need to do this open and since his left side is the symptomatic side then we would concentrate on that.   The patient's history has been reviewed, patient examined, no change in status, stable for surgery.  I have reviewed the patient's chart and labs.  Questions were answered to the patient's satisfaction.     Pedro Earls

## 2018-03-01 NOTE — Anesthesia Procedure Notes (Signed)
Procedure Name: Intubation Date/Time: 03/01/2018 9:52 AM Performed by: Glory Buff, CRNA Pre-anesthesia Checklist: Patient identified, Emergency Drugs available, Suction available and Patient being monitored Patient Re-evaluated:Patient Re-evaluated prior to induction Oxygen Delivery Method: Circle system utilized Preoxygenation: Pre-oxygenation with 100% oxygen Induction Type: IV induction Ventilation: Mask ventilation without difficulty Laryngoscope Size: Miller and 3 Grade View: Grade I Tube type: Oral Tube size: 7.5 mm Number of attempts: 1 Airway Equipment and Method: Stylet and Oral airway Placement Confirmation: ETT inserted through vocal cords under direct vision,  positive ETCO2 and breath sounds checked- equal and bilateral Secured at: 22 cm Tube secured with: Tape Dental Injury: Teeth and Oropharynx as per pre-operative assessment

## 2018-03-04 ENCOUNTER — Encounter (HOSPITAL_COMMUNITY): Payer: Self-pay | Admitting: Surgery

## 2018-04-14 DIAGNOSIS — L218 Other seborrheic dermatitis: Secondary | ICD-10-CM | POA: Diagnosis not present

## 2018-04-14 DIAGNOSIS — L308 Other specified dermatitis: Secondary | ICD-10-CM | POA: Diagnosis not present

## 2018-07-25 DIAGNOSIS — R600 Localized edema: Secondary | ICD-10-CM | POA: Diagnosis not present

## 2018-07-25 DIAGNOSIS — N400 Enlarged prostate without lower urinary tract symptoms: Secondary | ICD-10-CM | POA: Diagnosis not present

## 2018-07-25 DIAGNOSIS — R252 Cramp and spasm: Secondary | ICD-10-CM | POA: Diagnosis not present

## 2018-07-25 DIAGNOSIS — E039 Hypothyroidism, unspecified: Secondary | ICD-10-CM | POA: Diagnosis not present

## 2018-08-09 ENCOUNTER — Other Ambulatory Visit: Payer: Medicare Other

## 2018-08-09 ENCOUNTER — Other Ambulatory Visit: Payer: Self-pay

## 2018-08-09 DIAGNOSIS — Z20822 Contact with and (suspected) exposure to covid-19: Secondary | ICD-10-CM

## 2018-08-12 LAB — NOVEL CORONAVIRUS, NAA: SARS-CoV-2, NAA: NOT DETECTED

## 2018-08-16 ENCOUNTER — Telehealth: Payer: Self-pay | Admitting: General Practice

## 2018-08-16 NOTE — Telephone Encounter (Signed)
Pt called in , gave him NEG Covid results, expressed understanding

## 2018-11-05 ENCOUNTER — Ambulatory Visit (HOSPITAL_COMMUNITY)
Admission: EM | Admit: 2018-11-05 | Discharge: 2018-11-05 | Disposition: A | Discharge status: Home / Self Care | Payer: Medicare Other | Attending: Family Medicine | Admitting: Family Medicine

## 2018-11-05 ENCOUNTER — Other Ambulatory Visit: Payer: Self-pay

## 2018-11-05 ENCOUNTER — Ambulatory Visit (INDEPENDENT_AMBULATORY_CARE_PROVIDER_SITE_OTHER): Payer: Medicare Other

## 2018-11-05 ENCOUNTER — Encounter (HOSPITAL_COMMUNITY): Payer: Self-pay | Admitting: Emergency Medicine

## 2018-11-05 DIAGNOSIS — S99912A Unspecified injury of left ankle, initial encounter: Secondary | ICD-10-CM

## 2018-11-05 NOTE — ED Triage Notes (Signed)
Pt states around 2pm today he was working in outside and slipped with his shoes and twisted his L ankle.

## 2018-11-06 ENCOUNTER — Telehealth (HOSPITAL_COMMUNITY): Payer: Self-pay | Admitting: Family Medicine

## 2018-11-06 MED ORDER — HYDROCODONE-ACETAMINOPHEN 5-325 MG PO TABS: 1.0000 | ORAL_TABLET | Freq: Four times a day (QID) | ORAL | 0 refills | Status: DC | PRN

## 2018-11-06 NOTE — Telephone Encounter (Signed)
Spoke with patient. Ankle pain is worse. Requests something for pain. Cannot take NSAIDS. Tylenol without relief.  [place] Controlled Substances Registry consulted for this patient. I feel the risk/benefit ratio today is favorable for proceeding with this prescription for a controlled substance. Medication sedation precautions given.  Meds ordered this encounter  Medications  . HYDROcodone-acetaminophen (NORCO/VICODIN) 5-325 MG tablet    Sig: Take 1 tablet by mouth every 6 (six) hours as needed for moderate pain or severe pain.    Dispense:  12 tablet    Refill:  0

## 2018-11-07 DIAGNOSIS — S93402A Sprain of unspecified ligament of left ankle, initial encounter: Secondary | ICD-10-CM | POA: Diagnosis not present

## 2018-11-07 DIAGNOSIS — M19072 Primary osteoarthritis, left ankle and foot: Secondary | ICD-10-CM | POA: Diagnosis not present

## 2018-11-07 NOTE — ED Provider Notes (Signed)
[facility]   [id number] 11/05/18 Arrival Time: [id number]  ASSESSMENT & PLAN:  1. Ankle injury, left, initial encounter     I have personally viewed the imaging studies ordered this visit. Apparent old injury and arthritic changes to medial ankle. No acute fracture visualized.  WBAT. Prefers OTC Tylenol. Will call with any worsening. No walking boot desired.  Orders Placed This Encounter  Procedures  . DG Ankle Complete Left    Recommend: Follow-up Information    Antony Contras, MD.   Specialty: Family Medicine Why: As needed. Contact information: [address] [place] [address] [phone]           Reviewed expectations re: course of current medical issues. Questions answered. Outlined signs and symptoms indicating need for more acute intervention. Patient verbalized understanding. After Visit Summary given.  SUBJECTIVE: History from: patient. Andrew Watson is a 78 y.o. male who reports persistent mild to moderate pain of his left medial ankle; described as aching; without radiation. Onset: abrupt. First noted: today. Injury/trama: yes, reports slipping and falling on a deck; thinks he hit or twisted ankle. Able to bear weight but with reported discomfort. Symptoms have progressed to a point and plateaued since beginning. Aggravating factors: weight bearing. Alleviating factors: rest. Associated symptoms: none reported. Extremity sensation changes or weakness: none. Self treatment: has not tried OTCs for relief of pain. Questions distant injury to L ankle.   Past Surgical History:  Procedure Laterality Date  . BACK SURGERY    . CHOLECYSTECTOMY    . COLONOSCOPY  07/08/2011   Procedure: COLONOSCOPY;  Surgeon: Rogene Houston, MD;  Location: [facility];  Service: Endoscopy;  Laterality: N/A;  930  . DUODENOTOMY  09/29/2016   repair of fistula, jejunal resection  . INGUINAL HERNIA REPAIR N/A 03/01/2018   Procedure:  LAPAROSCOPIC TEP BILATERAL INGUINAL HERNIA REPAIR;  Surgeon: Johnathan Hausen, MD;  Location: [facility];  Service: General;  Laterality: N/A;  . JEJUNOSTOMY FEEDING TUBE    . KNEE ARTHROSCOPY Left   . LEFT HEART CATHETERIZATION WITH CORONARY ANGIOGRAM N/A 05/07/2011   Procedure: LEFT HEART CATHETERIZATION WITH CORONARY ANGIOGRAM;  Surgeon: Peter M Martinique, MD;  Location: [facility] CATH LAB;  Service: Cardiovascular;  Laterality: N/A;  . PEG TUBE PLACEMENT    . VASECTOMY       ROS: As per HPI. All other systems negative.    OBJECTIVE:  Vitals:   11/05/18 1706  BP: 137/60  Pulse: 87  Resp: 18  Temp: 98.8 F (37.1 C)  SpO2: 100%    General appearance: alert; no distress HEENT: [place]; AT Neck: supple with FROM Resp: unlabored respirations Extremities: . LLE: warm with well perfused appearance; fairly well localized moderate tenderness over left medial malleolus; without gross deformities; swelling: moderate; bruising: none; ROM: normal with reported discomfort CV: brisk extremity capillary refill of LLE; 2+ DP/PT pulses of RLE. Skin: warm and dry; no visible rashes Neurologic: able to put weight onto LLE but reports pain; normal reflexes of LLE; normal sensation of LLE; normal strength of LLE Psychological: alert and cooperative; normal mood and affect  Imaging: Dg Ankle Complete Left  Result Date: 11/05/2018 CLINICAL DATA:  Left ankle injury. EXAM: LEFT ANKLE COMPLETE - 3+ VIEW COMPARISON:  None. FINDINGS: There is no evidence of fracture, dislocation, or joint effusion. Posttraumatic/osteoarthritic changes about the medial malleolus. Mild soft tissue swelling about the ankle. IMPRESSION: 1. No acute fracture or dislocation identified about the left ankle. 2. Posttraumatic/osteoarthritic changes about  the medial malleolus. Electronically Signed   By: Fidela Salisbury M.D.   On: 11/05/2018 17:50    No Known Allergies  Past Medical History:  Diagnosis Date  . Acute pancreatitis 05/2015    at [facility] for 8 weeks  . Anemia 2017   History of  . Aortic valve stenosis 04/02/2017   MODERATE TO SEVERE AORTIC VALVE STENOSIS WITH MEAN GRADIENT OF 20-25MMHG, BUT DI OF 0.21  VISUALLY SEVERE STENOSIS, noted on ECHO  . Arthritis   . Atherosclerotic coronary vascular disease 05/07/2011   Minimal nonobstructive atherosclerotic coronary disease  . Bilateral lower extremity edema    pt denies  . GERD (gastroesophageal reflux disease)   . Gout   . Heart murmur   . History of atrial fibrillation 2013   one time  . History of kidney stones   . History of MRSA infection   . History of pulmonary embolism 2017   pt denied  . Hypothyroidism   . LVH (left ventricular hypertrophy) 04/02/2017   Noted on EKG  . MR (mitral regurgitation)    Mild, noted on ECHO  . Obese   . TR (tricuspid regurgitation)    Mild, noted on ECHO   Social History   Socioeconomic History  . Marital status: Widowed    Spouse name: Not on file  . Number of children: Not on file  . Years of education: Not on file  . Highest education level: Not on file  Occupational History  . Not on file  Social Needs  . Financial resource strain: Not on file  . Food insecurity    Worry: Not on file    Inability: Not on file  . Transportation needs    Medical: Not on file    Non-medical: Not on file  Tobacco Use  . Smoking status: Never Smoker  . Smokeless tobacco: Never Used  Substance and Sexual Activity  . Alcohol use: No    Comment: none since May 31st  . Drug use: No  . Sexual activity: Not on file  Lifestyle  . Physical activity    Days per week: Not on file    Minutes per session: Not on file  . Stress: Not on file  Relationships  . Social Herbalist on phone: Not on file    Gets together: Not on file    Attends religious service: Not on file    Active member of club or organization: Not on file    Attends meetings of clubs or organizations: Not on file    Relationship status: Not  on file  Other Topics Concern  . Not on file  Social History Narrative  . Not on file   FH: HTN      Vanessa Kick, MD 11/07/18 [phone]

## 2018-11-21 DIAGNOSIS — S93402D Sprain of unspecified ligament of left ankle, subsequent encounter: Secondary | ICD-10-CM | POA: Diagnosis not present

## 2018-12-05 ENCOUNTER — Encounter (HOSPITAL_COMMUNITY): Payer: Self-pay | Admitting: Emergency Medicine

## 2018-12-05 ENCOUNTER — Emergency Department (HOSPITAL_COMMUNITY): Payer: Medicare Other

## 2018-12-05 ENCOUNTER — Emergency Department (HOSPITAL_COMMUNITY)
Admission: EM | Admit: 2018-12-05 | Discharge: 2018-12-06 | Disposition: A | Discharge status: Home / Self Care | Payer: Medicare Other | Attending: Emergency Medicine | Admitting: Emergency Medicine

## 2018-12-05 DIAGNOSIS — I2510 Atherosclerotic heart disease of native coronary artery without angina pectoris: Secondary | ICD-10-CM | POA: Diagnosis not present

## 2018-12-05 DIAGNOSIS — G453 Amaurosis fugax: Secondary | ICD-10-CM | POA: Diagnosis not present

## 2018-12-05 DIAGNOSIS — Z961 Presence of intraocular lens: Secondary | ICD-10-CM | POA: Diagnosis not present

## 2018-12-05 DIAGNOSIS — E039 Hypothyroidism, unspecified: Secondary | ICD-10-CM | POA: Insufficient documentation

## 2018-12-05 DIAGNOSIS — H35371 Puckering of macula, right eye: Secondary | ICD-10-CM | POA: Diagnosis not present

## 2018-12-05 DIAGNOSIS — H538 Other visual disturbances: Secondary | ICD-10-CM | POA: Diagnosis present

## 2018-12-05 DIAGNOSIS — Z79899 Other long term (current) drug therapy: Secondary | ICD-10-CM | POA: Insufficient documentation

## 2018-12-05 DIAGNOSIS — I499 Cardiac arrhythmia, unspecified: Secondary | ICD-10-CM | POA: Diagnosis not present

## 2018-12-05 DIAGNOSIS — H43812 Vitreous degeneration, left eye: Secondary | ICD-10-CM | POA: Diagnosis not present

## 2018-12-05 DIAGNOSIS — H547 Unspecified visual loss: Secondary | ICD-10-CM | POA: Diagnosis not present

## 2018-12-05 LAB — CBC
HCT: 40.1 % (ref 39.0–52.0)
Hemoglobin: 12.9 g/dL — ABNORMAL LOW (ref 13.0–17.0)
MCH: 30.1 pg (ref 26.0–34.0)
MCHC: 32.2 g/dL (ref 30.0–36.0)
MCV: 93.5 fL (ref 80.0–100.0)
Platelets: 151 10*3/uL (ref 150–400)
RBC: 4.29 MIL/uL (ref 4.22–5.81)
RDW: 12.7 % (ref 11.5–15.5)
WBC: 5.9 10*3/uL (ref 4.0–10.5)
nRBC: 0 % (ref 0.0–0.2)

## 2018-12-05 LAB — DIFFERENTIAL
Abs Immature Granulocytes: 0.02 10*3/uL (ref 0.00–0.07)
Basophils Absolute: 0 10*3/uL (ref 0.0–0.1)
Basophils Relative: 1 %
Eosinophils Absolute: 0.1 10*3/uL (ref 0.0–0.5)
Eosinophils Relative: 2 %
Immature Granulocytes: 0 %
Lymphocytes Relative: 27 %
Lymphs Abs: 1.6 10*3/uL (ref 0.7–4.0)
Monocytes Absolute: 0.6 10*3/uL (ref 0.1–1.0)
Monocytes Relative: 9 %
Neutro Abs: 3.6 10*3/uL (ref 1.7–7.7)
Neutrophils Relative %: 61 %

## 2018-12-05 LAB — COMPREHENSIVE METABOLIC PANEL
ALT: 21 U/L (ref 0–44)
AST: 23 U/L (ref 15–41)
Albumin: 3.8 g/dL (ref 3.5–5.0)
Alkaline Phosphatase: 41 U/L (ref 38–126)
Anion gap: 9 (ref 5–15)
BUN: 22 mg/dL (ref 8–23)
CO2: 22 mmol/L (ref 22–32)
Calcium: 9.3 mg/dL (ref 8.9–10.3)
Chloride: 107 mmol/L (ref 98–111)
Creatinine, Ser: 1.03 mg/dL (ref 0.61–1.24)
GFR calc Af Amer: >60 mL/min (ref >60)
GFR calc non Af Amer: >60 mL/min (ref >60)
Glucose, Bld: 113 mg/dL — ABNORMAL HIGH (ref 70–99)
Potassium: 4.4 mmol/L (ref 3.5–5.1)
Sodium: 138 mmol/L (ref 135–145)
Total Bilirubin: 1.3 mg/dL — ABNORMAL HIGH (ref 0.3–1.2)
Total Protein: 6.7 g/dL (ref 6.5–8.1)

## 2018-12-05 LAB — I-STAT CHEM 8, ED
BUN: 25 mg/dL — ABNORMAL HIGH (ref 8–23)
Calcium, Ion: 1.21 mmol/L (ref 1.15–1.40)
Chloride: 105 mmol/L (ref 98–111)
Creatinine, Ser: 1 mg/dL (ref 0.61–1.24)
Glucose, Bld: 109 mg/dL — ABNORMAL HIGH (ref 70–99)
HCT: 39 % (ref 39.0–52.0)
Hemoglobin: 13.3 g/dL (ref 13.0–17.0)
Potassium: 4.3 mmol/L (ref 3.5–5.1)
Sodium: 140 mmol/L (ref 135–145)
TCO2: 23 mmol/L (ref 22–32)

## 2018-12-05 LAB — PROTIME-INR
INR: 1.1 (ref 0.8–1.2)
Prothrombin Time: 14.1 seconds (ref 11.4–15.2)

## 2018-12-05 LAB — ETHANOL: Alcohol, Ethyl (B): <10 mg/dL (ref <10)

## 2018-12-05 LAB — APTT: aPTT: 40 seconds — ABNORMAL HIGH (ref 24–36)

## 2018-12-05 NOTE — ED Notes (Signed)
Pt states that he is not going to wait much longer, advised pt to stay, pt states he is going to go home and then went to sit back down in his seat

## 2018-12-05 NOTE — ED Triage Notes (Signed)
Pt here from [facility] for a possible stroke, pt has about a minute of vision loss today at about 4 pm , no symptoms at this present time , no other symptoms along with the vision loss

## 2018-12-06 ENCOUNTER — Emergency Department (HOSPITAL_COMMUNITY): Payer: Medicare Other

## 2018-12-06 ENCOUNTER — Encounter (HOSPITAL_COMMUNITY): Payer: Self-pay | Admitting: Neurology

## 2018-12-06 ENCOUNTER — Emergency Department (HOSPITAL_BASED_OUTPATIENT_CLINIC_OR_DEPARTMENT_OTHER): Payer: Medicare Other

## 2018-12-06 DIAGNOSIS — G459 Transient cerebral ischemic attack, unspecified: Secondary | ICD-10-CM

## 2018-12-06 DIAGNOSIS — G453 Amaurosis fugax: Secondary | ICD-10-CM | POA: Diagnosis not present

## 2018-12-06 LAB — HEMOGLOBIN A1C
Hgb A1c MFr Bld: 5.1 % (ref 4.8–5.6)
Mean Plasma Glucose: 99.67 mg/dL

## 2018-12-06 LAB — LIPID PANEL
Cholesterol: 116 mg/dL (ref 0–200)
HDL: 45 mg/dL (ref >40)
LDL Cholesterol: 66 mg/dL (ref 0–99)
Total CHOL/HDL Ratio: 2.6 RATIO
Triglycerides: 25 mg/dL (ref <150)
VLDL: 5 mg/dL (ref 0–40)

## 2018-12-06 MED ORDER — IOHEXOL 350 MG/ML SOLN
75.0000 mL | Freq: Once | INTRAVENOUS | Status: AC | PRN
  Administered 2018-12-06: 75 mL via INTRAVENOUS

## 2018-12-06 MED ORDER — ASPIRIN 81 MG PO CHEW: 81.0000 mg | CHEWABLE_TABLET | Freq: Every day | ORAL | 0 refills | Status: DC

## 2018-12-06 MED ORDER — CLOPIDOGREL BISULFATE 75 MG PO TABS: 75.0000 mg | ORAL_TABLET | Freq: Every day | ORAL | 0 refills | Status: DC

## 2018-12-06 MED ORDER — ATORVASTATIN CALCIUM 40 MG PO TABS: 40.0000 mg | ORAL_TABLET | Freq: Every day | ORAL | 0 refills | Status: DC

## 2018-12-06 MED ORDER — PERFLUTREN LIPID MICROSPHERE
1.0000 mL | INTRAVENOUS | Status: AC | PRN
  Administered 2018-12-06: 4 mL via INTRAVENOUS
  Filled 2018-12-06: qty 10

## 2018-12-06 MED ORDER — SODIUM CHLORIDE 0.9 % IV BOLUS: 9.0000 mL | Freq: Once | INTRAVENOUS | Status: DC

## 2018-12-06 NOTE — ED Notes (Signed)
Patient transported to CT 

## 2018-12-06 NOTE — ED Notes (Signed)
Patient was outside and told security he would be outside but didn't inform nursing staff. Patient back in lobby

## 2018-12-06 NOTE — Consult Note (Addendum)
Neurology Consultation  Reason for Consult: Transient loss of vision in right eye Referring Physician: Kathrynn Humble  CC: Transient loss of vision in the right eye now resolved  History is obtained from: Patient  HPI: Andrew Watson is a 78 y.o. male with history of tricuspid regurgitation, mitral regurgitation, left ventricular hypertrophy, history of atrial fibrillation, aortic stenosis.  Patient states that he was out yesterday with his friend at approximately 1700 hrs.  He suddenly lost vision in his right eye for a few seconds which then quickly turned into loss of vision in the lower aspect of his vision in the right eye both quadrants and then fully resolved.  He states that this has occurred approximately 3 years ago however he did not see any medical attention for this.  He cannot recall which eye this occurred in 3 years ago.  At this point symptoms have fully resolved.  Patient admits to not taking aspirin. Patient denies headache, decreased strength and or sensation throughout body.  Patient does have a history of atrial fibrillation.  However asking patient he has no recollection of when it was diagnosed.  He does recall he was in the hospital for multiple surgeries in 2017.  It was at that time he was diagnosed with atrial fibrillation.  He has never been on an anticoagulant for the atrial fibrillation.  Unfortunately he cannot give me any further information.  Looking through care everywhere cardiology was consulted for clearance.  They do mention that he had possibly postoperative atrial fibrillation.  But could not verify this.  ED course  CT head Labs  Chart review (no prior neurological notes noted in epic)  LKW: 1700 hrs. on 12/05/2018 tpa given?: no, symptoms resolved Premorbid modified Rankin scale (mRS):  Night stroke scale 0   Past Medical History:  Diagnosis Date  . Acute pancreatitis 05/2015   at [facility] for 8 weeks  . Anemia 2017   History of  . Aortic  valve stenosis 04/02/2017   MODERATE TO SEVERE AORTIC VALVE STENOSIS WITH MEAN GRADIENT OF 20-25MMHG, BUT DI OF 0.21  VISUALLY SEVERE STENOSIS, noted on ECHO  . Arthritis   . Atherosclerotic coronary vascular disease 05/07/2011   Minimal nonobstructive atherosclerotic coronary disease  . Bilateral lower extremity edema    pt denies  . GERD (gastroesophageal reflux disease)   . Gout   . Heart murmur   . History of atrial fibrillation 2013   one time  . History of kidney stones   . History of MRSA infection   . History of pulmonary embolism 2017   pt denied  . Hypothyroidism   . LVH (left ventricular hypertrophy) 04/02/2017   Noted on EKG  . MR (mitral regurgitation)    Mild, noted on ECHO  . Obese   . TR (tricuspid regurgitation)    Mild, noted on ECHO     Family History  Problem Relation Age of Onset  . Hypertension Mother   . Hypertension Father      Social History:   reports that he has never smoked. He has never used smokeless tobacco. He reports that he does not drink alcohol or use drugs.  Medications  Current Facility-Administered Medications:  .  perflutren lipid microspheres (DEFINITY) IV suspension, 1-10 mL, Intravenous, PRN, Kathrynn Humble, Ankit, MD, 4 mL at 12/06/18 1020 .  sodium chloride 0.9 % bolus 9 mL, 9 mL, Intravenous, Once, Nanavati, Ankit, MD  Current Outpatient Medications:  .  acetaminophen (TYLENOL) 500 MG tablet, Take  1,000 mg by mouth every 6 (six) hours as needed for moderate pain or headache., Disp: , Rfl:  .  HYDROcodone-acetaminophen (NORCO/VICODIN) 5-325 MG tablet, Take 1 tablet by mouth every 6 (six) hours as needed for moderate pain or severe pain., Disp: 12 tablet, Rfl: 0 .  levothyroxine (SYNTHROID, LEVOTHROID) 112 MCG tablet, Take 224 mcg by mouth daily before breakfast. , Disp: , Rfl:    Exam: Current vital signs: BP 125/71   Pulse 89   Temp 97.9 F (36.6 C) (Oral)   Resp (!) 24   SpO2 97%  Vital signs in last 24 hours: Temp:   [97.9 F (36.6 C)-98.3 F (36.8 C)] 97.9 F (36.6 C) (11/10 0255) Pulse Rate:  [78-90] 89 (11/10 1015) Resp:  [15-30] 24 (11/10 1015) BP: (109-140)/(69-90) 125/71 (11/10 1015) SpO2:  [96 %-100 %] 97 % (11/10 1015)  ROS:    General ROS: negative for - chills, fatigue, fever, night sweats, weight gain or weight loss Psychological ROS: negative for - behavioral disorder, hallucinations, memory difficulties, mood swings or suicidal ideation Ophthalmic ROS: negative for - blurry vision, double vision, eye pain or loss of vision ENT ROS: negative for - epistaxis, nasal discharge, oral lesions, sore throat, tinnitus or vertigo Allergy and Immunology ROS: negative for - hives or itchy/watery eyes Respiratory ROS: negative for - cough, hemoptysis, shortness of breath or wheezing Cardiovascular ROS: negative for - chest pain, dyspnea on exertion, edema or irregular heartbeat Gastrointestinal ROS: negative for - abdominal pain, diarrhea, hematemesis, nausea/vomiting or stool incontinence Genito-Urinary ROS: negative for - dysuria, hematuria, incontinence or urinary frequency/urgency Musculoskeletal ROS: negative for - joint swelling or muscular weakness Neurological ROS: as noted in HPI Dermatological ROS: negative for rash and skin lesion changes   Physical Exam   Constitutional: Appears well-developed and well-nourished.  Psych: Affect appropriate to situation Eyes: No scleral injection HENT: No OP obstrucion Head: Normocephalic.  Cardiovascular: Normal rate and regular rhythm.  Respiratory: Effort normal, non-labored breathing GI: Soft.  No distension. There is no tenderness.  Skin: WDI  Neuro: Mental Status: Patient is awake, alert, oriented to person, place, month, year, and situation. Patient is able to give a clear and coherent history. No signs of aphasia or neglect Cranial Nerves: II: Visual Fields are full.  III,IV, VI: EOMI without ptosis or diploplia. Pupils equal, round  and reactive to light V: Facial sensation is symmetric to temperature VII: Facial movement is symmetric.  VIII: hearing is intact to voice X: Palat elevates symmetrically XI: Shoulder shrug is symmetric. XII: tongue is midline without atrophy or fasciculations.  Motor: Tone is normal. Bulk is normal. 5/5 strength was present in all four extremities.  Sensory: Sensation is symmetric to light touch and temperature in the arms and legs. Deep Tendon Reflexes: 2+ and symmetric in the biceps and patellae.  Plantars: Toes are downgoing bilaterally.  Cerebellar: FNF and HKS are intact bilaterally   Labs I have reviewed labs in epic and the results pertinent to this consultation are:   CBC    Component Value Date/Time   WBC 5.9 12/05/2018 1920   RBC 4.29 12/05/2018 1920   HGB 13.3 12/05/2018 1935   HCT 39.0 12/05/2018 1935   PLT 151 12/05/2018 1920   MCV 93.5 12/05/2018 1920   MCH 30.1 12/05/2018 1920   MCHC 32.2 12/05/2018 1920   RDW 12.7 12/05/2018 1920   LYMPHSABS 1.6 12/05/2018 1920   MONOABS 0.6 12/05/2018 1920   EOSABS 0.1 12/05/2018 1920   BASOSABS 0.0  12/05/2018 1920    CMP     Component Value Date/Time   NA 140 12/05/2018 1935   K 4.3 12/05/2018 1935   CL 105 12/05/2018 1935   CO2 22 12/05/2018 1920   GLUCOSE 109 (H) 12/05/2018 1935   BUN 25 (H) 12/05/2018 1935   CREATININE 1.00 12/05/2018 1935   CALCIUM 9.3 12/05/2018 1920   PROT 6.7 12/05/2018 1920   ALBUMIN 3.8 12/05/2018 1920   AST 23 12/05/2018 1920   ALT 21 12/05/2018 1920   ALKPHOS 41 12/05/2018 1920   BILITOT 1.3 (H) 12/05/2018 1920   GFRNONAA >60 12/05/2018 1920   GFRAA >60 12/05/2018 1920    Lipid Panel     Component Value Date/Time   CHOL 112 05/09/2011 0715   TRIG 69 07/10/2015 0406   HDL 47 05/09/2011 0715   CHOLHDL 2.4 05/09/2011 0715   VLDL 15 05/09/2011 0715   LDLCALC 50 05/09/2011 0715   LDL-50 A1C-5.1  Imaging I have reviewed the images obtained:  CT-scan of the brain-no  acute intracranial abnormality  MRI examination of the brain- There is no acute infarction or intracranial hemorrhage. There is no intracranial mass, mass effect, edema, hydrocephalus, or extra-axial fluid collection. Patchy T2 hyperintensity in the supratentorial white matter is nonspecific but may reflect mild chronic microvascular ischemic changes. 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  Vascular: Normal flow voids.  CTA of head and neck-No hemodynamically significant stenosis or evidence dissection. 1.5 cm right thyroid nodule.  Echo: FINDINGS  Left Ventricle: Left ventricular ejection fraction, by visual estimation, is 60 to 65%. Right Ventricle: The right ventricular size is normal. No increase in right ventricular wall thickness.   IAS/Shunts: No atrial level shunt detected by color flow Doppler. Agitated saline contrast was given intravenously to evaluate for intracardiac shunting.     Andrew Quill PA-C [facility] [phone]  M-F  (9:00 am- 5:00 PM)  12/06/2018, 10:55 AM     Assessment:  78 year old male with transient loss of vision in the right eye lasting only seconds.  Patient states that this is not the first time this is happened.  Given description this very much sounds like amaurosis fugax.  Patient will need a stroke work-up.  At this point time the majority of the stroke work-up such as echocardiogram, CTA of head and neck, MRI brain can be done while in the ED.  Labs can be followed by primary care.   Impression: -Amaurosis fugax  Recommendations: -Start aspirin 81 mg and Plavix 70 mg for 3 weeks followed by aspirin alone -Start Lipitor 40 mg daily as this is proven to be stroke protective -follow up with neurology as out patient in 3 weeks with Dr. Leonie Man in stroke clinic -Outpatient cardiology for loop monitor  Dr. Lorraine Lax to see patient in emergency department after tests are completed  NEUROHOSPITALIST  ADDENDUM Performed a face to face diagnostic evaluation.   I have reviewed the contents of history and physical exam as documented by PA/ARNP/Resident and agree with above documentation.  I have discussed and formulated the above plan as documented. Edits to the note have been made as needed.  Patient is a very pleasant 78 year old male with history of atrial fibrillation at time of left heart cath, history of MR and TR and pancreatitis.  Presents with symptoms with what appears to be classical for amaurosis fugax, sudden loss of vision lasting for less than a minute in the right.  States that initially half  of it resolved followed by complete resolution within a minute.  This occurred while he was talking with his neighbor.  He does complain of a similar episode 3 years ago but cannot remember correctly if it was the right eye and if it was sudden onset.  Denies any history of headache, jaw claudication, joint pain, fever.  Symptoms not associated with any other stroke warning signs.  He had a history of atrial fibrillation that was documented in 2013 which is surrounding heart cath procedure.  Has never had any symptoms since then.  Patient immediately went to see [place] ophthalmologist Dr.Grout, who noted a normal ophthalmologic exam and recommended him go to the ER to see a neurologist for further TIA work-up.  He has been in the ER for close to 20 hours.  Has had an extensive work-up here. Work-up in the ED included MRI brain which was unremarkable, CT angiogram which showed no significant stenosis of either carotid or significant atherosclerotic burden.  He also had an echocardiogram which showed EF of 65%, no dilated left atrium and no PFO.  My impression is that he likely had a TIA of the eye, the only part of the history that would raise suspicion for alternative diagnosis is a previous episode 3 years ago.  Given sudden onset complete loss of vision without any RRR floaters makes ocular migraine  unlikely and he has had ophthalmology evaluation that has ruled out glaucoma.  Discussed in detail plan for stroke prevention including currently starting on aspirin Plavix for 3 weeks and then aspirin alone based on chance trial findings.  Also recommended patient to be on a statin despite his excellent LDL of 66. His CHA2DS2-VASc 2 score is now 4 which would warrant anticoagulation, however since his A. fib in the past has been periprocedural I would be hesitant to start him on anticoagulation and would like him to have this discussion with his cardiologist. I do think he warrants prolonged heart monitoring to assess if he still is having episodes of paroxysmal A. Fib  .     Andrew Addison Aroor MD [facility] [id number]   If 7pm to 7am, please call on call as listed on AMION.

## 2018-12-06 NOTE — ED Provider Notes (Addendum)
[facility] EMERGENCY DEPARTMENT Provider Note   CSN: [id number] Arrival date & time: 12/05/18  1833     History   Chief Complaint No chief complaint on file.   HPI Andrew Watson is a 78 y.o. male.     HPI  78 year old male comes in a chief complaint of vision loss. He has history of aortic stenosis and other valvular pathology, gallstone pancreatitis with complication including PE for which he is not on any anticoagulation anymore.  Patient reports that he had sudden episode of right eye complete vision loss for few seconds.  Before the vision returned back to normal there was a.  When he was unable to see the lower half split.  In total the loss of vision lasted for about 30 seconds.  Patient had no associated headache, neck pain, focal numbness, weakness, slurred speech, confusion, dizziness.  Patient called his eye doctor who advised that he start taking aspirin and come to the ER.   Past Medical History:  Diagnosis Date  . Acute pancreatitis 05/2015   at [facility] for 8 weeks  . Anemia 2017   History of  . Aortic valve stenosis 04/02/2017   MODERATE TO SEVERE AORTIC VALVE STENOSIS WITH MEAN GRADIENT OF 20-25MMHG, BUT DI OF 0.21  VISUALLY SEVERE STENOSIS, noted on ECHO  . Arthritis   . Atherosclerotic coronary vascular disease 05/07/2011   Minimal nonobstructive atherosclerotic coronary disease  . Bilateral lower extremity edema    pt denies  . GERD (gastroesophageal reflux disease)   . Gout   . Heart murmur   . History of atrial fibrillation 2013   one time  . History of kidney stones   . History of MRSA infection   . History of pulmonary embolism 2017   pt denied  . Hypothyroidism   . LVH (left ventricular hypertrophy) 04/02/2017   Noted on EKG  . MR (mitral regurgitation)    Mild, noted on ECHO  . Obese   . TR (tricuspid regurgitation)    Mild, noted on ECHO    Patient Active Problem List   Diagnosis Date Noted  . S/P   lap bilateral inguinal hernia repair Feb 2020 03/01/2018  . Retroperitoneal fluid collection 04/20/2016  . Hypotension 11/24/2015  . History of pancreatitis 11/24/2015  . Jejunostomy tube in situ ([place]) 11/24/2015  . Volume depletion 11/24/2015  . Pancreatitis 08/12/2015  . Cholangitis 08/12/2015  . Pulmonary emboli ([place]) 08/12/2015  . Duodenal stricture 08/10/2015  . Gastric outlet obstruction 08/10/2015  . H/O bypass gastrojejunostomy 08/10/2015  . Pancreatic pseudocyst 08/01/2015  . Alcohol use 07/03/2015  . Atrial fibrillation ([place]) 07/03/2015  . Acute pancreatitis 06/26/2015  . Insomnia 05/15/2011  . Pericarditis 05/09/2011  . Hypothyroidism 05/08/2011    Past Surgical History:  Procedure Laterality Date  . BACK SURGERY    . CHOLECYSTECTOMY    . COLONOSCOPY  07/08/2011   Procedure: COLONOSCOPY;  Surgeon: Rogene Houston, MD;  Location: [facility]ITE;  Service: Endoscopy;  Laterality: N/A;  930  . DUODENOTOMY  09/29/2016   repair of fistula, jejunal resection  . INGUINAL HERNIA REPAIR N/A 03/01/2018   Procedure: LAPAROSCOPIC TEP BILATERAL INGUINAL HERNIA REPAIR;  Surgeon: Johnathan Hausen, MD;  Location: [facility] ORS;  Service: General;  Laterality: N/A;  . JEJUNOSTOMY FEEDING TUBE    . KNEE ARTHROSCOPY Left   . LEFT HEART CATHETERIZATION WITH CORONARY ANGIOGRAM N/A 05/07/2011   Procedure: LEFT HEART CATHETERIZATION WITH CORONARY ANGIOGRAM;  Surgeon: Ander Slade  [place], MD;  Location: [facility] CATH LAB;  Service: Cardiovascular;  Laterality: N/A;  . PEG TUBE PLACEMENT    . VASECTOMY          Home Medications    Prior to Admission medications   Medication Sig Start Date End Date Taking? Authorizing Provider  acetaminophen (TYLENOL) 500 MG tablet Take 1,000 mg by mouth every 6 (six) hours as needed for moderate pain or headache.    [provider]  HYDROcodone-acetaminophen (NORCO/VICODIN) 5-325 MG tablet Take 1 tablet by mouth every 6 (six) hours as needed for moderate pain or  severe pain. 11/06/18   Vanessa Kick, MD  levothyroxine (SYNTHROID, LEVOTHROID) 112 MCG tablet Take 224 mcg by mouth daily before breakfast.     [provider]    Family History Family History  Problem Relation Age of Onset  . Hypertension Mother   . Hypertension Father     Social History Social History   Tobacco Use  . Smoking status: Never Smoker  . Smokeless tobacco: Never Used  Substance Use Topics  . Alcohol use: No    Comment: none since May 31st  . Drug use: No     Allergies   Patient has no known allergies.   Review of Systems Review of Systems  Constitutional: Positive for activity change.  Eyes: Positive for visual disturbance.  Cardiovascular: Negative for chest pain.  Allergic/Immunologic: Negative for immunocompromised state.  Neurological: Negative for dizziness, tremors, seizures, syncope, facial asymmetry, speech difficulty, weakness, light-headedness, numbness and headaches.  Hematological: Does not bruise/bleed easily.     Physical Exam Updated Vital Signs BP 125/71   Pulse 89   Temp 97.9 F (36.6 C) (Oral)   Resp (!) 24   SpO2 97%   Physical Exam Vitals signs and nursing note reviewed.  Constitutional:      Appearance: He is well-developed.  HENT:     Head: Atraumatic.  Eyes:     Extraocular Movements: Extraocular movements intact.     Pupils: Pupils are equal, round, and reactive to light.  Neck:     Musculoskeletal: Neck supple.  Cardiovascular:     Rate and Rhythm: Normal rate.  Pulmonary:     Effort: Pulmonary effort is normal.  Skin:    General: Skin is warm.  Neurological:     General: No focal deficit present.     Mental Status: He is alert and oriented to person, place, and time.     Cranial Nerves: No cranial nerve deficit.     Sensory: No sensory deficit.     Motor: No weakness.     Coordination: Coordination normal.      ED Treatments / Results  Labs (all labs ordered are listed, but only abnormal  results are displayed) Labs Reviewed  APTT - Abnormal; Notable for the following components:      Result Value   aPTT 40 (*)    All other components within normal limits  CBC - Abnormal; Notable for the following components:   Hemoglobin 12.9 (*)    All other components within normal limits  COMPREHENSIVE METABOLIC PANEL - Abnormal; Notable for the following components:   Glucose, Bld 113 (*)    Total Bilirubin 1.3 (*)    All other components within normal limits  I-STAT CHEM 8, ED - Abnormal; Notable for the following components:   BUN 25 (*)    Glucose, Bld 109 (*)    All other components within normal limits  ETHANOL  PROTIME-INR  DIFFERENTIAL  LIPID PANEL  HEMOGLOBIN A1C    EKG EKG Interpretation  Date/Time:  Tuesday December 06 2018 13:55:40 EST Ventricular Rate:  81 PR Interval:  180 QRS Duration: 72 QT Interval:  390 QTC Calculation: 453 R Axis:   32 Text Interpretation: Sinus rhythm with marked sinus arrhythmia Otherwise normal ECG No acute changes No significant change since last tracing Confirmed by Varney Biles [phone]) on 12/06/2018 1:58:37 PM   Radiology Ct Angio Head W Or Wo Contrast  Result Date: 12/06/2018 CLINICAL DATA:  TIA, abnormal right vision now resolved EXAM: CT ANGIOGRAPHY HEAD AND NECK TECHNIQUE: Multidetector CT imaging of the head and neck was performed using the standard protocol during bolus administration of intravenous contrast. Multiplanar CT image reconstructions and MIPs were obtained to evaluate the vascular anatomy. Carotid stenosis measurements (when applicable) are obtained utilizing NASCET criteria, using the distal internal carotid diameter as the denominator. CONTRAST:  84mL OMNIPAQUE IOHEXOL 350 MG/ML SOLN COMPARISON:  CT head December 05, 2018 FINDINGS: CT HEAD FINDINGS Brain: There is no acute intracranial hemorrhage, mass effect, or edema. There is no new loss of gray-white differentiation. Patchy hypoattenuation in the  supratentorial white matter likely reflects stable chronic microvascular ischemic changes. Ventricles are stable in size. There is no extra-axial fluid collection. Vascular: There is intracranial atherosclerotic calcification at the skull base. Skull: Calvarium is unremarkable. Sinuses: Aerated. Orbits: Unremarkable. Review of the MIP images confirms the above findings CTA NECK FINDINGS Aortic arch: Great vessel origins are patent. Right carotid system: Common, internal, and external carotid arteries are patent. No hemodynamically significant stenosis or evidence of dissection. Left carotid system: Common, internal, and external carotid arteries are patent. There is mild calcified plaque along the proximal left ICA without measurable stenosis. No hemodynamically significant stenosis or evidence of dissection. Vertebral arteries: Codominant and patent. There is plaque at the proximal left vertebral artery causing mild stenosis. No significant stenosis or evidence of dissection. Skeleton: Degenerative changes of the cervical spine. Other neck: There is a right thyroid nodule measuring 1.5 cm. No neck mass or adenopathy. Upper chest: No apical lung mass. Review of the MIP images confirms the above findings CTA HEAD FINDINGS Anterior circulation: Intracranial internal carotid arteries, middle cerebral arteries, and anterior cerebral arteries are patent. No significant stenosis or aneurysm. Posterior circulation: Intracranial vertebral, basilar, and posterior cerebral arteries are patent. No significant stenosis or aneurysm. Venous sinuses: Patent as permitted by contrast timing Anatomic variants: None. Review of the MIP images confirms the above findings IMPRESSION: No acute intracranial hemorrhage or evidence evolving recent infarction. No hemodynamically significant stenosis or evidence dissection. 1.5 cm right thyroid nodule. Electronically Signed   By: Macy Mis M.D.   On: 12/06/2018 14:12   Ct Head Wo  Contrast  Result Date: 12/05/2018 CLINICAL DATA:  Visual loss today EXAM: CT HEAD WITHOUT CONTRAST TECHNIQUE: Contiguous axial images were obtained from the base of the skull through the vertex without intravenous contrast. COMPARISON:  None. FINDINGS: Brain: No evidence of acute territorial infarction, hemorrhage, hydrocephalus,extra-axial collection or mass lesion/mass effect. There is mild dilatation the ventricles and sulci consistent with age-related atrophy. Low-attenuation changes in the deep white matter consistent with small vessel ischemia. Vascular: No hyperdense vessel or unexpected calcification. Skull: The skull is intact. No fracture or focal lesion identified. Sinuses/Orbits: The visualized paranasal sinuses and mastoid air cells are clear. The orbits and globes intact. Other: None IMPRESSION: No acute intracranial abnormality. Findings consistent with mild age related atrophy and chronic small vessel ischemia  Electronically Signed   By: Prudencio Pair M.D.   On: 12/05/2018 20:37   Ct Angio Neck W And/or Wo Contrast  Result Date: 12/06/2018 CLINICAL DATA:  TIA, abnormal right vision now resolved EXAM: CT ANGIOGRAPHY HEAD AND NECK TECHNIQUE: Multidetector CT imaging of the head and neck was performed using the standard protocol during bolus administration of intravenous contrast. Multiplanar CT image reconstructions and MIPs were obtained to evaluate the vascular anatomy. Carotid stenosis measurements (when applicable) are obtained utilizing NASCET criteria, using the distal internal carotid diameter as the denominator. CONTRAST:  8mL OMNIPAQUE IOHEXOL 350 MG/ML SOLN COMPARISON:  CT head December 05, 2018 FINDINGS: CT HEAD FINDINGS Brain: There is no acute intracranial hemorrhage, mass effect, or edema. There is no new loss of gray-white differentiation. Patchy hypoattenuation in the supratentorial white matter likely reflects stable chronic microvascular ischemic changes. Ventricles are stable  in size. There is no extra-axial fluid collection. Vascular: There is intracranial atherosclerotic calcification at the skull base. Skull: Calvarium is unremarkable. Sinuses: Aerated. Orbits: Unremarkable. Review of the MIP images confirms the above findings CTA NECK FINDINGS Aortic arch: Great vessel origins are patent. Right carotid system: Common, internal, and external carotid arteries are patent. No hemodynamically significant stenosis or evidence of dissection. Left carotid system: Common, internal, and external carotid arteries are patent. There is mild calcified plaque along the proximal left ICA without measurable stenosis. No hemodynamically significant stenosis or evidence of dissection. Vertebral arteries: Codominant and patent. There is plaque at the proximal left vertebral artery causing mild stenosis. No significant stenosis or evidence of dissection. Skeleton: Degenerative changes of the cervical spine. Other neck: There is a right thyroid nodule measuring 1.5 cm. No neck mass or adenopathy. Upper chest: No apical lung mass. Review of the MIP images confirms the above findings CTA HEAD FINDINGS Anterior circulation: Intracranial internal carotid arteries, middle cerebral arteries, and anterior cerebral arteries are patent. No significant stenosis or aneurysm. Posterior circulation: Intracranial vertebral, basilar, and posterior cerebral arteries are patent. No significant stenosis or aneurysm. Venous sinuses: Patent as permitted by contrast timing Anatomic variants: None. Review of the MIP images confirms the above findings IMPRESSION: No acute intracranial hemorrhage or evidence evolving recent infarction. No hemodynamically significant stenosis or evidence dissection. 1.5 cm right thyroid nodule. Electronically Signed   By: Macy Mis M.D.   On: 12/06/2018 14:12   Mr Brain Wo Contrast  Result Date: 12/06/2018 CLINICAL DATA:  Sudden episode of right eye vision loss, now normal EXAM: MRI  HEAD WITHOUT CONTRAST TECHNIQUE: Multiplanar, multiecho pulse sequences of the brain and surrounding structures were obtained without intravenous contrast. COMPARISON:  None. FINDINGS: Brain: There is no acute infarction or intracranial hemorrhage. There is no intracranial mass, mass effect, edema, hydrocephalus, or extra-axial fluid collection. Patchy T2 hyperintensity in the supratentorial white matter is nonspecific but may reflect mild chronic microvascular ischemic changes. 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 Vascular: Normal flow voids. Skull and upper cervical spine: Marrow signal is within normal limits. Sinuses/Orbits: Mild paranasal sinus mucosal thickening. Bilateral lens replacements. Other: Left greater than right mastoid fluid opacification. IMPRESSION: No evidence of recent infarction, hemorrhage, or mass. Mild chronic microvascular ischemic changes. Right caudate chronic infarct. Electronically Signed   By: Macy Mis M.D.   On: 12/06/2018 14:58    Procedures Procedures (including critical care time)  Medications Ordered in ED Medications  sodium chloride 0.9 % bolus 9 mL (has no administration in time range)  perflutren lipid  microspheres (DEFINITY) IV suspension (4 mLs Intravenous Given 12/06/18 1020)  iohexol (OMNIPAQUE) 350 MG/ML injection 75 mL (75 mLs Intravenous Contrast Given 12/06/18 1318)     Initial Impression / Assessment and Plan / ED Course  I have reviewed the triage vital signs and the nursing notes.  Pertinent labs & imaging results that were available during my care of the patient were reviewed by me and considered in my medical decision making (see chart for details).  Clinical Course as of Dec 06 1538  Tue Dec 06, 2018  0900 Results of the CT scan discussed with the patient at 54 AM. I spoke with neurologist and they recommend that we get CT angiogram head and neck and also MRI without brain to rule out any evidence of small  stroke. Echocardiogram ordered.  Patient is considering partial work-up with early discharge today versus admission to the hospital.  [place] [AN]  1025 Patient's records indicate A. fib in 2017, however he states that he has never been told he has A. fib or put on medications for it.  It is possible that while he was admitted for his gallstone pancreatitis he might have had an episode of A. Fib.  Neurology team to see the patient.  Patient is getting echocardiogram right now.  If all the work-up is negative, neuro team feels comfortable sending patient home with outpatient neuro evaluation.  Patient is also comfortable with this plan.  Dispo pending results of the MRI, CT angio and echo.   [AN]  1539 I spoke with patient's daughter.  She informed me that in 2012 or 2013 patient might have been diagnosed with A. fib.  Upon further review it does show in our records that patient had postprocedural A. fib per discharge summary from April 2013.  Dr. Lorraine Lax will reassess the patient to see if patient needs to be started on DOAC.  He is stable for discharge otherwise.   [AN]    Clinical Course User Index [AN] Varney Biles, MD       78 year old male comes in a chief complaint of transient unilateral right-sided vision loss.  It seems like there is also hemianopsia with a horizontal midline prior to resolution of the vision loss.  Patient had a similar episode 3 years ago.  He did not follow-up with anyone about it.  He does not have metabolic syndrome nor does he have any history of known strokes or CAD in the past.  He denies any history of A. fib or dysrhythmias.  CT scan of the brain does reveal old stroke.  Patient has been in the ED now about 13 hours prior to my assessment.  He does not endorse any repeat episodes.  I suspect that he likely had a TIA/amaurosis fugax.  CRAO is lower in the differential but also possible.  Neuro consulted.   Final Clinical Impressions(s) / ED  Diagnoses   Final diagnoses:  AF (amaurosis fugax)    ED Discharge Orders    None       Varney Biles, MD 12/06/18 [id number]    Varney Biles, MD 12/06/18 1540

## 2018-12-06 NOTE — ED Notes (Signed)
Patient transported to MRI 

## 2018-12-06 NOTE — Progress Notes (Signed)
  Echocardiogram 2D Echocardiogram with definity and bubble study has been performed.  Darlina Sicilian M 12/06/2018, 10:40 AM

## 2018-12-06 NOTE — Discharge Instructions (Addendum)
You are seen in the ER for transient vision loss yesterday.  Our neurology team has seen you.  Work-up in the ER is reassuring.  We have called [facility] and you have an appointment set up for 1:30 PM tomorrow.  Dr Aroor saw you today and does not feel that you need this urgent of a visit, recommended having you see Dr. Leonie Man in 3 weeks.   Dr. Lorraine Lax also recommended you see a cardiologist to get a 30 day event monitor.  Please call the number provided to set up an appointment  Take aspirin(81mg ) and plavix for three weeks, then aspirin only.  Also ask Dr. Jannifer Franklin if you need to start taking Lipitor, cholesterol medicine.

## 2018-12-06 NOTE — ED Notes (Signed)
Dr. Kathrynn Humble at bedside, update given to patient and family regarding neuro consult.

## 2018-12-06 NOTE — ED Notes (Signed)
Neuro at bedside.

## 2018-12-07 ENCOUNTER — Inpatient Hospital Stay: Payer: Medicare Other | Admitting: Neurology

## 2018-12-07 ENCOUNTER — Telehealth: Payer: Self-pay | Admitting: Neurology

## 2018-12-07 ENCOUNTER — Encounter: Payer: Self-pay | Admitting: Neurology

## 2018-12-07 NOTE — Telephone Encounter (Signed)
This patient did not show for a new patient appointment today. 

## 2018-12-09 ENCOUNTER — Other Ambulatory Visit: Payer: Self-pay

## 2018-12-09 DIAGNOSIS — Z20822 Contact with and (suspected) exposure to covid-19: Secondary | ICD-10-CM

## 2018-12-11 LAB — NOVEL CORONAVIRUS, NAA: SARS-CoV-2, NAA: NOT DETECTED

## 2018-12-20 DIAGNOSIS — Z961 Presence of intraocular lens: Secondary | ICD-10-CM | POA: Diagnosis not present

## 2018-12-20 DIAGNOSIS — G453 Amaurosis fugax: Secondary | ICD-10-CM | POA: Diagnosis not present

## 2018-12-20 DIAGNOSIS — H43812 Vitreous degeneration, left eye: Secondary | ICD-10-CM | POA: Diagnosis not present

## 2018-12-20 DIAGNOSIS — H35371 Puckering of macula, right eye: Secondary | ICD-10-CM | POA: Diagnosis not present

## 2018-12-25 ENCOUNTER — Telehealth: Payer: Self-pay | Admitting: Unknown Physician Specialty

## 2018-12-25 ENCOUNTER — Other Ambulatory Visit: Payer: Self-pay | Admitting: Unknown Physician Specialty

## 2018-12-25 DIAGNOSIS — U071 COVID-19: Secondary | ICD-10-CM

## 2018-12-25 NOTE — Telephone Encounter (Signed)
Discussed with patient about Covid symptoms and the use of bamlanivimab, a monoclonal antibody infusion for those with mild to moderate Covid symptoms and at a high risk of hospitalization.    Pt is qualified for this infusion at the [facility] infusion center due to age.  Dry cough for 6 days.  I also discussed with his daughter.

## 2018-12-26 ENCOUNTER — Telehealth: Payer: Self-pay | Admitting: Cardiology

## 2018-12-26 DIAGNOSIS — U071 COVID-19: Secondary | ICD-10-CM | POA: Diagnosis not present

## 2018-12-26 MED ORDER — CLOPIDOGREL BISULFATE 75 MG PO TABS: 75.0000 mg | ORAL_TABLET | Freq: Every day | ORAL | 0 refills | Status: DC

## 2018-12-26 NOTE — Telephone Encounter (Signed)
Scheduled for 1PM 12/1.  Instructions given to patient.

## 2018-12-26 NOTE — Telephone Encounter (Signed)
Filled pt's Plavix for 30 days.

## 2018-12-27 ENCOUNTER — Ambulatory Visit: Payer: Medicare Other | Admitting: Neurology

## 2018-12-27 ENCOUNTER — Ambulatory Visit (HOSPITAL_COMMUNITY)
Admission: RE | Admit: 2018-12-27 | Discharge: 2018-12-27 | Disposition: A | Discharge status: Home / Self Care | Payer: Medicare Other | Source: Ambulatory Visit | Attending: Critical Care Medicine | Admitting: Critical Care Medicine

## 2018-12-27 DIAGNOSIS — U071 COVID-19: Secondary | ICD-10-CM | POA: Insufficient documentation

## 2018-12-27 DIAGNOSIS — Z23 Encounter for immunization: Secondary | ICD-10-CM | POA: Insufficient documentation

## 2018-12-27 MED ORDER — ALBUTEROL SULFATE HFA 108 (90 BASE) MCG/ACT IN AERS: 2.0000 | INHALATION_SPRAY | Freq: Once | RESPIRATORY_TRACT | Status: DC | PRN

## 2018-12-27 MED ORDER — EPINEPHRINE 0.3 MG/0.3ML IJ SOAJ: 0.3000 mg | Freq: Once | INTRAMUSCULAR | Status: DC | PRN

## 2018-12-27 MED ORDER — SODIUM CHLORIDE 0.9 % IV SOLN: INTRAVENOUS | Status: DC | PRN

## 2018-12-27 MED ORDER — METHYLPREDNISOLONE SODIUM SUCC 125 MG IJ SOLR: 125.0000 mg | Freq: Once | INTRAMUSCULAR | Status: DC | PRN

## 2018-12-27 MED ORDER — SODIUM CHLORIDE 0.9 % IV SOLN
700.0000 mg | Freq: Once | INTRAVENOUS | Status: AC
  Administered 2018-12-27: 14:00:00 700 mg via INTRAVENOUS
  Filled 2018-12-27: qty 20

## 2018-12-27 MED ORDER — FAMOTIDINE IN NACL 20-0.9 MG/50ML-% IV SOLN: 20.0000 mg | Freq: Once | INTRAVENOUS | Status: DC | PRN

## 2018-12-27 MED ORDER — DIPHENHYDRAMINE HCL 50 MG/ML IJ SOLN: 50.0000 mg | Freq: Once | INTRAMUSCULAR | Status: DC | PRN

## 2018-12-27 NOTE — Progress Notes (Signed)
.   Diagnosis: COVID-19  Physician:  Procedure: bamlanivimab infusion Provided patient with bamlanivimab fact sheet for patients, parents and caregivers prior to infusion.  Complications: No immediate complications noted.  Discharge: Discharged home   Babs Sciara 12/27/2018

## 2018-12-28 ENCOUNTER — Ambulatory Visit: Payer: Medicare Other | Admitting: Cardiology

## 2019-01-22 ENCOUNTER — Encounter: Payer: Self-pay | Admitting: Cardiology

## 2019-01-22 NOTE — Progress Notes (Signed)
Cardiology Office Note  Date: 01/23/2019   ID: [name], Taraba 07-Oct-1940, MRN [id number]  PCP:  Antony Contras, MD  Consulting Cardiologist: Satira Sark, MD Electrophysiologist:  None   Chief Complaint  Patient presents with  . Cardiac evaluation  . Aortic Stenosis  . History of atrial fibrillation    History of Present Illness: Andrew Watson is a 78 y.o. male referred for cardiology consultation by Dr. Moreen Fowler with diagnosis of severe aortic stenosis, also suspected TIA in November with request for a cardiac monitor.  I reviewed extensive records and updated the chart.  He is here today with his daughter from [place].  He was seen for neurology consultation in November by Dr. Lorraine Lax for suspected TIA, had negative work-up for acute stroke including brain MRI and cranial CTA.  He was started on aspirin and Plavix, although there was some discussion about anticoagulation given reported remote history of atrial fibrillation and CHA2DS2-VASc score of at least 4.   Chart indicates diagnosis with COVID-19 in November/December, he did receive an outpatient bamlanivimab infusion.  He tells me he did well in terms of symptoms and recuperated without incident.  Records indicate cardiology consultation through the [place] system in March 2019, he was seen by Dr. Tobe Sos.  He was felt to have moderate aortic stenosis at that time, cleared preoperatively for hip surgery.  He was followed several years ago by Dr. Lattie Haw, last had contact with our practice in 2013. The diagnosis of atrial fibrillation looks to have been made following cardiac catheterization in 2013, described as paroxysmal and was treated with aspirin at that point.  It is not clear that he has had significant breakthrough episodes based on available information.  He does not report any sense of palpitations.  Most recent echocardiogram from November 2020 revealed LVEF 60 to 65% with mild diastolic dysfunction,  severe calcific aortic stenosis with mean gradient 53 mmHg and dimensionless index of 0.18.  This has progressed compared to prior evaluation last year.  He reports NYHA class II-III dyspnea on exertion, no obvious angina, palpitations, or syncope.  He states that he has felt more short of breath in the last few months, was wondering whether it might have been related to COVID-19, but his daughter indicates that he has seemed more short of breath prior to this.  Today we discussed the diagnosis and natural history of aortic stenosis as well as potential interventions and management.  We also discussed his history of atrial fibrillation, recent TIA, and rationale for anticoagulation versus antiplatelet therapies.  Patient's daughter does have concerns about his taking anticoagulation, states that her mother had an intracranial bleed.  Patient was given a prescription for Lipitor 40 mg daily at ER evaluation, has not started the medication.  Recent lipid panel showed LDL 66 on no treatment.  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  Past Medical History:  Diagnosis Date  . Acute pancreatitis 2017   [facility]  . Aortic valve stenosis   . Arthritis   . Atherosclerotic coronary vascular disease 05/07/2011   Minimal nonobstructive atherosclerotic coronary disease  . GERD (gastroesophageal reflux disease)   . Gout   . History of anemia   . History of atrial fibrillation 2013   Reportedly limited episode  . History of kidney stones   . History of MRSA infection   . History of pulmonary embolism 2017  . Hypothyroidism   .  TIA (transient ischemic attack)    November 2020    Past Surgical History:  Procedure Laterality Date  . BACK SURGERY    . CHOLECYSTECTOMY    . COLONOSCOPY  07/08/2011   Procedure: COLONOSCOPY;  Surgeon: Rogene Houston, MD;  Location: [facility] SUITE;  Service: Endoscopy;  Laterality: N/A;   930  . DUODENOTOMY  09/29/2016   repair of fistula, jejunal resection  . INGUINAL HERNIA REPAIR N/A 03/01/2018   Procedure: LAPAROSCOPIC TEP BILATERAL INGUINAL HERNIA REPAIR;  Surgeon: Johnathan Hausen, MD;  Location: [facility] ORS;  Service: General;  Laterality: N/A;  . JEJUNOSTOMY FEEDING TUBE    . KNEE ARTHROSCOPY Left   . LEFT HEART CATHETERIZATION WITH CORONARY ANGIOGRAM N/A 05/07/2011   Procedure: LEFT HEART CATHETERIZATION WITH CORONARY ANGIOGRAM;  Surgeon: Peter M Martinique, MD;  Location: [facility] CATH LAB;  Service: Cardiovascular;  Laterality: N/A;  . PEG TUBE PLACEMENT    . VASECTOMY      Current Outpatient Medications  Medication Sig Dispense Refill  . acetaminophen (TYLENOL) 500 MG tablet Take 1,000 mg by mouth every 6 (six) hours as needed for moderate pain or headache.    [name] Kitchen amoxicillin (AMOXIL) 500 MG capsule Take 4 capsules by mouth as needed. Take 4 capsules 1 hour prior dental procedures.    [name] Kitchen aspirin 81 MG chewable tablet Chew 1 tablet (81 mg total) by mouth daily. 30 tablet 0  . clopidogrel (PLAVIX) 75 MG tablet Take 1 tablet (75 mg total) by mouth daily. 30 tablet 0  . levothyroxine (SYNTHROID, LEVOTHROID) 112 MCG tablet Take 224 mcg by mouth daily before breakfast.     . tamsulosin (FLOMAX) 0.4 MG CAPS capsule Take 0.4 mg by mouth at bedtime.    [name] Kitchen atorvastatin (LIPITOR) 10 MG tablet Take 1 tablet (10 mg total) by mouth daily. 30 tablet 6   No current facility-administered medications for this visit.   Allergies:  Patient has no known allergies.   Social History: The patient  reports that he has never smoked. He has never used smokeless tobacco. He reports that he does not drink alcohol or use drugs.   Family History: The patient's family history includes Hypertension in his father and mother.   ROS:  Please see the history of present illness. Otherwise, complete review of systems is positive for dyspnea on exertion.  All other systems are reviewed and negative.   Physical Exam:  VS:  BP 121/84   Pulse 86   Ht 5\' 10"  (1.778 m)   Wt 219 lb (99.3 kg)   BMI 31.42 kg/m , BMI Body mass index is 31.42 kg/m.  Wt Readings from Last 3 Encounters:  01/23/19 219 lb (99.3 kg)  03/01/18 221 lb (100.2 kg)  02/25/18 221 lb (100.2 kg)    General: Elderly male, appears comfortable at rest. HEENT: Conjunctiva and lids normal, wearing a mask. Neck: Supple, no elevated JVP or carotid bruits, no thyromegaly. Lungs: Clear to auscultation, nonlabored breathing at rest. Cardiac: Regular rate and rhythm, no S3, 3/6 systolic murmur, no pericardial rub. Abdomen: Soft, nontender, bowel sounds present, no guarding or rebound. Extremities: No pitting edema, distal pulses 2+. Skin: Warm and dry. Musculoskeletal: No kyphosis. Neuropsychiatric: Alert and oriented x3, affect grossly appropriate.   ECG:  An ECG dated 12/05/2018 was personally reviewed today and demonstrated:  Normal sinus rhythm with nonspecific T wave changes.  Recent Labwork: 12/05/2018: ALT 21; AST 23; BUN 25; Creatinine, Ser 1.00; Hemoglobin 13.3; Platelets 151; Potassium 4.3; Sodium 140  Component Value Date/Time   CHOL 116 12/06/2018 1238   TRIG 25 12/06/2018 1238   HDL 45 12/06/2018 1238   CHOLHDL 2.6 12/06/2018 1238   VLDL 5 12/06/2018 1238   LDLCALC 66 12/06/2018 1238    Other Studies Reviewed Today:   Echocardiogram 12/06/2018:  1. Left ventricular ejection fraction, by visual estimation, is 60 to 65%. The left ventricle has normal function. There is mildly increased left ventricular hypertrophy.  2. The left ventricle has no regional wall motion abnormalities.  3. Definity contrast agent was given IV to delineate the left ventricular endocardial borders.  4. Left ventricular diastolic parameters are consistent with Grade I diastolic dysfunction (impaired relaxation).  5. Global right ventricle has normal systolic function.The right ventricular size is normal. No increase in right ventricular wall  thickness.  6. Left atrial size was normal.  7. Right atrial size was normal.  8. Mild to moderate mitral annular calcification.  9. The mitral valve is normal in structure. No evidence of mitral valve regurgitation. No evidence of mitral stenosis. 10. The tricuspid valve is normal in structure. Tricuspid valve regurgitation is trivial. 11. The aortic valve is tricuspid. Aortic valve regurgitation is not visualized. Severe aortic valve stenosis. Mean gradient 53 mmHg with AVA 0.57 cm^2. 12. The inferior vena cava is normal in size with greater than 50% respiratory variability, suggesting right atrial pressure of 3 mmHg. 13. The tricuspid regurgitant velocity is 2.47 m/s, and with an assumed right atrial pressure of 3 mmHg, the estimated right ventricular systolic pressure is normal at 27.4 mmHg.  Brain MRI 12/06/2018: IMPRESSION: No evidence of recent infarction, hemorrhage, or mass.  Mild chronic microvascular ischemic changes. Right caudate chronic Infarct.  CTA neck 12/06/2018:  IMPRESSION: No acute intracranial hemorrhage or evidence evolving recent infarction.  No hemodynamically significant stenosis or evidence dissection.  1.5 cm right thyroid nodule.  Cardiac catheterization/11/2011: Procedural Findings: Hemodynamics: AO 116/68 mmHg LV 122 with an EDP of 22 mmHg  Coronary angiography: Coronary dominance: left  Left mainstem: The LAD and circumflex have separate ostia.  Left anterior descending (LAD): The LAD is a large vessel with scattered irregularities in the mid vessel up to 10-20%.  Left circumflex (LCx): The circumflex is a large dominant vessel. There is  10-20% narrowing after the first obtuse marginal vessel.  Right coronary artery (RCA): This is a small nondominant vessel and is normal.  Left ventriculography:N/A  Final Conclusions: 1. Minimal nonobstructive atherosclerotic coronary disease. 2. Normal left ventricular filling pressures.   Assessment and Plan:  1.  Severe, calcific aortic stenosis with mean gradient 53 mmHg and dimensionless index of 0.18 by recent echocardiogram in November.  Patient reports more notable dyspnea on exertion at least over the last few months.  Plan is referral for TAVR evaluation after discussion of issues today.  2.  Remote history of atrial fibrillation documented in 2013.  It is unclear based on record review that he has had any definitive recurrences, does not report any palpitations.  In light of recent suspected TIA and CHA2DS2-VASc score of 4, anticoagulation is certainly a consideration.  We discussed this in detail today.  His daughter does voice some concerns about him taking anticoagulation since her mother had an intracranial hemorrhage in similar situation.  He is currently on aspirin and Plavix.  Plan at this time is to proceed with a 30-day event recorder and make further decisions from there. He did not want to consider an implantable loop recorder.  3.  Recent  LDL 66, no longstanding history of hyperlipidemia.  With recent TIA would agree with statin therapy, initiate Lipitor at 10 mg daily for now.  4.  Minimal coronary atherosclerosis documented at cardiac catheterization in 2013.  Medication Adjustments/Labs and Tests Ordered: Current medicines are reviewed at length with the patient today.  Concerns regarding medicines are outlined above.   Tests Ordered: Orders Placed This Encounter  Procedures  . Ambulatory referral to [facility] (only at [place])  . Cardiac event monitor    Medication Changes: Meds ordered this encounter  Medications  . atorvastatin (LIPITOR) 10 MG tablet    Sig: Take 1 tablet (10 mg total) by mouth daily.    Dispense:  30 tablet    Refill:  6    New 01/23/2019    Disposition:  Follow up 4 to 6 weeks.  Signed, Satira Sark, MD, [facility] 01/23/2019 9:57 AM    [facility] [place], [place], [place] [address] Phone: [phone]; Fax: [phone]

## 2019-01-23 ENCOUNTER — Other Ambulatory Visit: Payer: Self-pay

## 2019-01-23 ENCOUNTER — Encounter: Payer: Self-pay | Admitting: Cardiology

## 2019-01-23 ENCOUNTER — Telehealth: Payer: Self-pay | Admitting: Cardiology

## 2019-01-23 ENCOUNTER — Ambulatory Visit (INDEPENDENT_AMBULATORY_CARE_PROVIDER_SITE_OTHER): Payer: Medicare Other | Admitting: Cardiology

## 2019-01-23 ENCOUNTER — Encounter

## 2019-01-23 VITALS — BP 121/84 | HR 86 | Ht 70.0 in | Wt 219.0 lb

## 2019-01-23 DIAGNOSIS — I350 Nonrheumatic aortic (valve) stenosis: Secondary | ICD-10-CM | POA: Diagnosis not present

## 2019-01-23 DIAGNOSIS — Z8673 Personal history of transient ischemic attack (TIA), and cerebral infarction without residual deficits: Secondary | ICD-10-CM | POA: Diagnosis not present

## 2019-01-23 DIAGNOSIS — I2510 Atherosclerotic heart disease of native coronary artery without angina pectoris: Secondary | ICD-10-CM

## 2019-01-23 DIAGNOSIS — Z8679 Personal history of other diseases of the circulatory system: Secondary | ICD-10-CM

## 2019-01-23 MED ORDER — ATORVASTATIN CALCIUM 10 MG PO TABS: 10.0000 mg | ORAL_TABLET | Freq: Every day | ORAL | 6 refills | Status: DC

## 2019-01-23 NOTE — Telephone Encounter (Signed)
Pre-cert Verification for the following procedure   30 day zio monitor - h/o AFib & TIA

## 2019-01-23 NOTE — Patient Instructions (Addendum)
Medication Instructions:   Begin Lipitor 10mg  daily.   Continue all other medications.    Labwork: none  Testing/Procedures:  Your physician has recommended that you wear a 30 day event monitor. Event monitors are medical devices that record the heart's electrical activity. Doctors most often [place] these monitors to diagnose arrhythmias. Arrhythmias are problems with the speed or rhythm of the heartbeat. The monitor is a small, portable device. You can wear one while you do your normal daily activities. This is usually used to diagnose what is causing palpitations/syncope (passing out).  Office will contact with results via phone or letter.    Follow-Up: 4-6 weeks   Any Other Special Instructions Will Be Listed Below (If Applicable). You have been referred to:  TAVR   If you need a refill on your cardiac medications before your next appointment, please call your pharmacy.

## 2019-01-30 ENCOUNTER — Other Ambulatory Visit: Payer: Self-pay | Admitting: Cardiology

## 2019-01-30 ENCOUNTER — Ambulatory Visit (INDEPENDENT_AMBULATORY_CARE_PROVIDER_SITE_OTHER): Payer: Medicare Other

## 2019-01-30 DIAGNOSIS — I471 Supraventricular tachycardia: Secondary | ICD-10-CM | POA: Diagnosis not present

## 2019-01-30 DIAGNOSIS — Z8673 Personal history of transient ischemic attack (TIA), and cerebral infarction without residual deficits: Secondary | ICD-10-CM

## 2019-01-30 DIAGNOSIS — Z8679 Personal history of other diseases of the circulatory system: Secondary | ICD-10-CM

## 2019-02-01 ENCOUNTER — Ambulatory Visit: Payer: Medicare Other | Admitting: Diagnostic Neuroimaging

## 2019-02-01 ENCOUNTER — Other Ambulatory Visit: Payer: Self-pay

## 2019-02-01 ENCOUNTER — Encounter: Payer: Self-pay | Admitting: Diagnostic Neuroimaging

## 2019-02-01 VITALS — BP 112/78 | HR 89 | Temp 96.9°F | Ht 70.0 in | Wt 218.4 lb

## 2019-02-01 DIAGNOSIS — G459 Transient cerebral ischemic attack, unspecified: Secondary | ICD-10-CM | POA: Diagnosis not present

## 2019-02-01 DIAGNOSIS — I4891 Unspecified atrial fibrillation: Secondary | ICD-10-CM | POA: Diagnosis not present

## 2019-02-01 NOTE — Patient Instructions (Signed)
TIA (right eye vision loss, partial) - continue aspirin 81mg  alone (already completed 3 weeks of aspirin + clopidogrel; thereforemay stop clopidogrel) - continue atorvastatin 10mg  daily - follow up cardiac monitoring

## 2019-02-01 NOTE — Progress Notes (Signed)
[facility]  PATIENT: Andrew Watson DOB: [birth year]  REFERRING CLINICIAN: Antony Contras, MD HISTORY FROM: patient and SO REASON FOR VISIT: new consult    HISTORICAL  CHIEF COMPLAINT:  Chief Complaint  Patient presents with  . Transient Ischemic Attack    rm 7 New Pt, hospital FU, wife- [name]    HISTORY OF PRESENT ILLNESS:   79 year old male with aortic stenosis, atrial fibrillation, presented to hospital on 12/06/2018 for transient right eye visual loss (partial).  This affected his inferior visual field in the right side.  Symptoms lasted for 20 or 30 seconds.  Patient had a similar episode in 2013 but he did not seek medical attention for this.  Patient to the hospital for evaluation was diagnosed with possible TIA.  TIA stroke work-up was completed.  Patient was discharged on aspirin Plavix for 3 weeks, to be followed by aspirin alone.  Patient also was found to have severe aortic stenosis and followed up with cardiology.  Since that time patient is doing well.  No further recurrence of symptoms.  He has continued on dual antiplatelet therapy as he did not remember instructions to reduce to single antiplatelet therapy after 3 weeks.  REVIEW OF SYSTEMS: Full 14 system review of systems performed and negative with exception of: As per HPI.  ALLERGIES: No Known Allergies  HOME MEDICATIONS: Outpatient Medications Prior to Visit  Medication Sig Dispense Refill  . aspirin 81 MG chewable tablet Chew 1 tablet (81 mg total) by mouth daily. 30 tablet 0  . atorvastatin (LIPITOR) 10 MG tablet Take 1 tablet (10 mg total) by mouth daily. 30 tablet 6  . clopidogrel (PLAVIX) 75 MG tablet TAKE ONE TABLET BY MOUTH ONCE DAILY. 90 tablet 1  . levothyroxine (SYNTHROID, LEVOTHROID) 112 MCG tablet Take 224 mcg by mouth daily before breakfast.     . amoxicillin (AMOXIL) 500 MG capsule Take 4 capsules by mouth as needed. Take 4 capsules 1 hour prior dental procedures.    .  tamsulosin (FLOMAX) 0.4 MG CAPS capsule Take 0.4 mg by mouth at bedtime.    [name] Kitchen acetaminophen (TYLENOL) 500 MG tablet Take 1,000 mg by mouth every 6 (six) hours as needed for moderate pain or headache.     No facility-administered medications prior to visit.    PAST MEDICAL HISTORY: Past Medical History:  Diagnosis Date  . Acute pancreatitis 2017   [facility]  . Aortic valve stenosis   . Arthritis   . Atherosclerotic coronary vascular disease 05/07/2011   Minimal nonobstructive atherosclerotic coronary disease  . GERD (gastroesophageal reflux disease)   . Gout   . History of anemia   . History of atrial fibrillation 2013   Reportedly limited episode  . History of kidney stones   . History of MRSA infection   . History of pulmonary embolism 2017  . Hypothyroidism   . Pancreatitis 05/2015  . TIA (transient ischemic attack)    November 2020    PAST SURGICAL HISTORY: Past Surgical History:  Procedure Laterality Date  . BACK SURGERY    . CHOLECYSTECTOMY    . COLONOSCOPY  07/08/2011   Procedure: COLONOSCOPY;  Surgeon: Rogene Houston, MD;  Location: [facility];  Service: Endoscopy;  Laterality: N/A;  930  . DUODENOTOMY  09/29/2016   repair of fistula, jejunal resection  . INGUINAL HERNIA REPAIR N/A 03/01/2018   Procedure: LAPAROSCOPIC TEP BILATERAL INGUINAL HERNIA REPAIR;  Surgeon: Johnathan Hausen, MD;  Location: [facility]S;  Service: General;  Laterality: N/A;  . JEJUNOSTOMY FEEDING TUBE    . KNEE ARTHROSCOPY Left   . LEFT HEART CATHETERIZATION WITH CORONARY ANGIOGRAM N/A 05/07/2011   Procedure: LEFT HEART CATHETERIZATION WITH CORONARY ANGIOGRAM;  Surgeon: Peter M Martinique, MD;  Location: [facility] CATH LAB;  Service: Cardiovascular;  Laterality: N/A;  . PEG TUBE PLACEMENT    . VASECTOMY      FAMILY HISTORY: Family History  Problem Relation Age of Onset  . Hypertension Mother   . Hypertension Father   . Other Brother        staph infection    SOCIAL HISTORY: Social History     Socioeconomic History  . Marital status: Married    Spouse name: [name]  . Number of children: Not on file  . Years of education: Not on file  . Highest education level: High school graduate  Occupational History    Comment: retired   Tobacco Use  . Smoking status: Never Smoker  . Smokeless tobacco: Never Used  Substance and Sexual Activity  . Alcohol use: No    Comment: none since Jun 26, 2015  . Drug use: No  . Sexual activity: Not on file  Other Topics Concern  . Not on file  Social History Narrative   Lives with wife   Caffeine- coffee 2 c daily, usually decaf   Social Determinants of Health   Financial Resource Strain:   . Difficulty of Paying Living Expenses: Not on file  Food Insecurity:   . Worried About Charity fundraiser in the Last Year: Not on file  . Ran Out of Food in the Last Year: Not on file  Transportation Needs:   . Lack of Transportation (Medical): Not on file  . Lack of Transportation (Non-Medical): Not on file  Physical Activity:   . Days of Exercise per Week: Not on file  . Minutes of Exercise per Session: Not on file  Stress:   . Feeling of Stress : Not on file  Social Connections:   . Frequency of Communication with Friends and Family: Not on file  . Frequency of Social Gatherings with Friends and Family: Not on file  . Attends Religious Services: Not on file  . Active Member of Clubs or Organizations: Not on file  . Attends Archivist Meetings: Not on file  . Marital Status: Not on file  Intimate Partner Violence:   . Fear of Current or Ex-Partner: Not on file  . Emotionally Abused: Not on file  . Physically Abused: Not on file  . Sexually Abused: Not on file     PHYSICAL EXAM  GENERAL EXAM/CONSTITUTIONAL: Vitals:  Vitals:   02/01/19 0813  BP: 112/78  Pulse: 89  Temp: (!) 96.9 F (36.1 C)  Weight: 218 lb 6.4 oz (99.1 kg)  Height: 5\' 10"  (1.778 m)     Body mass index is 31.34 kg/m. Wt Readings from Last 3  Encounters:  02/01/19 218 lb 6.4 oz (99.1 kg)  01/23/19 219 lb (99.3 kg)  03/01/18 221 lb (100.2 kg)     Patient is in no distress; well developed, nourished and groomed; neck is supple  CARDIOVASCULAR:  Examination of carotid arteries is normal; no carotid bruits  Regular rate and rhythm; SYSTOLIC MURMUR  Examination of peripheral vascular system by observation and palpation is normal  EYES:  Ophthalmoscopic exam of optic discs and posterior segments is normal; no papilledema or hemorrhages  No exam data present  MUSCULOSKELETAL:  Gait, strength, tone, movements noted  in Neurologic exam below  NEUROLOGIC: MENTAL STATUS:  No flowsheet data found.  awake, alert, oriented to person, place and time  recent and remote memory intact  normal attention and concentration  language fluent, comprehension intact, naming intact  fund of knowledge appropriate  CRANIAL NERVE:   2nd - no papilledema on fundoscopic exam  2nd, 3rd, 4th, 6th - pupils equal and reactive to light, visual fields full to confrontation, extraocular muscles intact, no nystagmus  5th - facial sensation symmetric  7th - facial strength symmetric  8th - hearing intact  9th - palate elevates symmetrically, uvula midline  11th - shoulder shrug symmetric  12th - tongue protrusion midline  MOTOR:   normal bulk and tone, full strength in the BUE, BLE  SENSORY:   normal and symmetric to light touch, pinprick, temperature, vibration  COORDINATION:   finger-nose-finger, fine finger movements normal  REFLEXES:   deep tendon reflexes present and symmetric  GAIT/STATION:   narrow based gait     DIAGNOSTIC DATA (LABS, IMAGING, TESTING) - I reviewed patient records, labs, notes, testing and imaging myself where available.  Lab Results  Component Value Date   WBC 5.9 12/05/2018   HGB 13.3 12/05/2018   HCT 39.0 12/05/2018   MCV 93.5 12/05/2018   PLT 151 12/05/2018      Component  Value Date/Time   NA 140 12/05/2018 1935   K 4.3 12/05/2018 1935   CL 105 12/05/2018 1935   CO2 22 12/05/2018 1920   GLUCOSE 109 (H) 12/05/2018 1935   BUN 25 (H) 12/05/2018 1935   CREATININE 1.00 12/05/2018 1935   CALCIUM 9.3 12/05/2018 1920   PROT 6.7 12/05/2018 1920   ALBUMIN 3.8 12/05/2018 1920   AST 23 12/05/2018 1920   ALT 21 12/05/2018 1920   ALKPHOS 41 12/05/2018 1920   BILITOT 1.3 (H) 12/05/2018 1920   GFRNONAA >60 12/05/2018 1920   GFRAA >60 12/05/2018 1920   Lab Results  Component Value Date   CHOL 116 12/06/2018   HDL 45 12/06/2018   LDLCALC 66 12/06/2018   TRIG 25 12/06/2018   CHOLHDL 2.6 12/06/2018   Lab Results  Component Value Date   HGBA1C 5.1 12/06/2018   No results found for: VITAMINB12 Lab Results  Component Value Date   TSH 13.000 (H) 05/08/2011    12/06/18 CTA head/neck No acute intracranial hemorrhage or evidence evolving recent  infarction.  No hemodynamically significant stenosis or evidence dissection.   1.5 cm right thyroid nodule.  12/06/18 MRI brain [I reviewed images myself and agree with interpretation. -[facility]]  - No evidence of recent infarction, hemorrhage, or mass. - Mild chronic microvascular ischemic changes. Right caudate chronic infarct.  12/06/18 TTE  1. Left ventricular ejection fraction, by visual estimation, is 60 to 65%. The left ventricle has normal function. There is mildly increased left ventricular hypertrophy.  2. The left ventricle has no regional wall motion abnormalities.  3. Definity contrast agent was given IV to delineate the left ventricular endocardial borders.  4. Left ventricular diastolic parameters are consistent with Grade I diastolic dysfunction (impaired relaxation).  5. Global right ventricle has normal systolic function.The right ventricular size is normal. No increase in right ventricular wall thickness.  6. Left atrial size was normal.  7. Right atrial size was normal.  8. Mild to moderate mitral  annular calcification.  9. The mitral valve is normal in structure. No evidence of mitral valve regurgitation. No evidence of mitral stenosis. 10. The tricuspid valve is normal  in structure. Tricuspid valve regurgitation is trivial. 11. The aortic valve is tricuspid. Aortic valve regurgitation is not visualized. Severe aortic valve stenosis. Mean gradient 53 mmHg with AVA 0.57 cm^2. 12. The inferior vena cava is normal in size with greater than 50% respiratory variability, suggesting right atrial pressure of 3 mmHg. 13. The tricuspid regurgitant velocity is 2.47 m/s, and with an assumed right atrial pressure of 3 mmHg, the estimated right ventricular systolic pressure is normal at 27.4 mmHg.   ASSESSMENT AND PLAN  79 y.o. year old male here with:  Dx:  1. TIA (transient ischemic attack)      PLAN:  TIA (right eye amaurosis fugax) - continue aspirin 81mg  alone (already completed 3 weeks of aspirin + clopidogrel) - continue atorvastatin 10mg  daily - follow up cardiac monitoring (for consideration of anticoagulation for prior atrial fibrillation) - follow up aortic valve stenosis - stroke risk factor reduction and education reviewed  Return for return to PCP, pending if symptoms worsen or fail to improve.  I spent 45 minutes of face-to-face and non-face-to-face time with patient.  This included previsit chart review, lab review, study review, order entry, electronic health record documentation, patient education.      Penni Bombard, MD [id number], 0000000 AM Certified in Neurology, Neurophysiology and Neuroimaging  [facility] [address], [place], [place] [address] [phone]

## 2019-02-03 ENCOUNTER — Institutional Professional Consult (permissible substitution): Payer: Medicare Other | Admitting: Cardiovascular Disease

## 2019-02-05 NOTE — H&P (View-Only) (Signed)
[facility] Consult Note  Chief Complaint  Patient presents with  . New Patient (Initial Visit)    Severe aortic stenosis   History of Present Illness: 79 yo male with history of CAD, GERD, remote atrial fibrillation, PE, hypothyroidism, TIA November 2020 and severe aortic stenosis, who is referred today by Dr. [name], for further evaluation of his aortic stenosis and possible TAVR. He was seen for neurology consultation in November by Dr. Lorraine Lax for suspected TIA, He had a negative workup for acute stroke with brain MRI and cranial CTA. He was started on aspirin and Plavix, although there was some discussion about anticoagulation given reported remote history of atrial fibrillation and CHA2DS2-VASc score of at least 4. He is now off of Plavix. He had COVID-19 in November and received an outpatient bamlanivimab infusion. Records indicate cardiology consultation through the [facility] system in March 2019 when he was seen by Dr. Tobe Sos.  He was felt to have moderate aortic stenosis at that time, cleared preoperatively for hip surgery. He was seen by Dr. [name] 01/23/19. Echo 12/06/18 with LVEF=60-65%, mild LVH. The aortic valve leaflets are thickened and calcified with limited leaflet excursion. Mean gradient 53 mmHg, peak gradient 77 mmHg,  AVA 0.57 cm2, dimensionless index of 0.18.    He tells me today that he has had progressive dyspnea. No chest pain, dizziness, near syncope, syncope or LE edema. He sees a dentist regularly and has no active dental issues. He lives in [place]. He is retired from [employer]. His wife died a few years back. He is here today with his girlfriend.   Primary Care Physician: Antony Contras, MD Primary Cardiologist: [name] Referring Cardiologist: [name]  Past Medical History:  Diagnosis Date  . Acute pancreatitis 2017   [facility]  . Aortic valve stenosis   . Arthritis   . Atherosclerotic coronary vascular disease 05/07/2011   Minimal  nonobstructive atherosclerotic coronary disease  . GERD (gastroesophageal reflux disease)   . History of anemia   . History of atrial fibrillation 2013   Reportedly limited episode  . History of kidney stones   . History of MRSA infection   . History of pulmonary embolism 2017  . Hypothyroidism   . Pancreatitis 05/2015  . TIA (transient ischemic attack)    November 2020    Past Surgical History:  Procedure Laterality Date  . BACK SURGERY    . CHOLECYSTECTOMY    . COLONOSCOPY  07/08/2011   Procedure: COLONOSCOPY;  Surgeon: Rogene Houston, MD;  Location: [facility]ITE;  Service: Endoscopy;  Laterality: N/A;  930  . DUODENOTOMY  09/29/2016   repair of fistula, jejunal resection  . INGUINAL HERNIA REPAIR N/A 03/01/2018   Procedure: LAPAROSCOPIC TEP BILATERAL INGUINAL HERNIA REPAIR;  Surgeon: Johnathan Hausen, MD;  Location: [facility] ORS;  Service: General;  Laterality: N/A;  . JEJUNOSTOMY FEEDING TUBE    . KNEE ARTHROSCOPY Left   . LEFT HEART CATHETERIZATION WITH CORONARY ANGIOGRAM N/A 05/07/2011   Procedure: LEFT HEART CATHETERIZATION WITH CORONARY ANGIOGRAM;  Surgeon: Peter M Martinique, MD;  Location: [facility] CATH LAB;  Service: Cardiovascular;  Laterality: N/A;  . PEG TUBE PLACEMENT    . VASECTOMY      Current Outpatient Medications  Medication Sig Dispense Refill  . amoxicillin (AMOXIL) 500 MG capsule Take 4 capsules by mouth as needed. Take 4 capsules 1 hour prior dental procedures.    [name] Kitchen aspirin 81 MG chewable tablet Chew 1 tablet (81 mg total) by mouth daily.  30 tablet 0  . atorvastatin (LIPITOR) 10 MG tablet Take 1 tablet (10 mg total) by mouth daily. 30 tablet 6  . levothyroxine (SYNTHROID, LEVOTHROID) 112 MCG tablet Take 224 mcg by mouth daily before breakfast.      No current facility-administered medications for this visit.    No Known Allergies  Social History   Socioeconomic History  . Marital status: Married    Spouse name: [name]  . Number of children: Not on file  . Years  of education: Not on file  . Highest education level: High school graduate  Occupational History    Comment: retired   Tobacco Use  . Smoking status: Never Smoker  . Smokeless tobacco: Never Used  Substance and Sexual Activity  . Alcohol use: No    Comment: none since Jun 26, 2015  . Drug use: No  . Sexual activity: Not on file  Other Topics Concern  . Not on file  Social History Narrative   Lives with wife   Caffeine- coffee 2 c daily, usually decaf   Social Determinants of Health   Financial Resource Strain:   . Difficulty of Paying Living Expenses: Not on file  Food Insecurity:   . Worried About Charity fundraiser in the Last Year: Not on file  . Ran Out of Food in the Last Year: Not on file  Transportation Needs:   . Lack of Transportation (Medical): Not on file  . Lack of Transportation (Non-Medical): Not on file  Physical Activity:   . Days of Exercise per Week: Not on file  . Minutes of Exercise per Session: Not on file  Stress:   . Feeling of Stress : Not on file  Social Connections:   . Frequency of Communication with Friends and Family: Not on file  . Frequency of Social Gatherings with Friends and Family: Not on file  . Attends Religious Services: Not on file  . Active Member of Clubs or Organizations: Not on file  . Attends Archivist Meetings: Not on file  . Marital Status: Not on file  Intimate Partner Violence:   . Fear of Current or Ex-Partner: Not on file  . Emotionally Abused: Not on file  . Physically Abused: Not on file  . Sexually Abused: Not on file    Family History  Problem Relation Age of Onset  . Arthritis Mother   . Other Brother        staph infection    Review of Systems:  As stated in the HPI and otherwise negative.   BP 116/68   Pulse 84   Ht 5\' 10"  (1.778 m)   Wt 218 lb (98.9 kg)   SpO2 96%   BMI 31.28 kg/m   Physical Examination: General: Well developed, well nourished, NAD  HEENT: OP clear, mucus membranes  moist  SKIN: warm, dry. No rashes. Neuro: No focal deficits  Musculoskeletal: Muscle strength 5/5 all ext  Psychiatric: Mood and affect normal  Neck: No JVD, no carotid bruits, no thyromegaly, no lymphadenopathy.  Lungs:Clear bilaterally, no wheezes, rhonci, crackles Cardiovascular: Regular rate and rhythm. Loud, harsh, late peaking systolic murmur.  Abdomen:Soft. Bowel sounds present. Non-tender.  Extremities: No lower extremity edema. Pulses are 2 + in the bilateral DP/PT.  EKG:  EKG is not ordered today. The ekg ordered today demonstrates   Echo 12/06/18:   1. Left ventricular ejection fraction, by visual estimation, is 60 to 65%. The left ventricle has normal function. There is mildly increased  left ventricular hypertrophy.  2. The left ventricle has no regional wall motion abnormalities.  3. Definity contrast agent was given IV to delineate the left ventricular endocardial borders.  4. Left ventricular diastolic parameters are consistent with Grade I diastolic dysfunction (impaired relaxation).  5. Global right ventricle has normal systolic function.The right ventricular size is normal. No increase in right ventricular wall thickness.  6. Left atrial size was normal.  7. Right atrial size was normal.  8. Mild to moderate mitral annular calcification.  9. The mitral valve is normal in structure. No evidence of mitral valve regurgitation. No evidence of mitral stenosis. 10. The tricuspid valve is normal in structure. Tricuspid valve regurgitation is trivial. 11. The aortic valve is tricuspid. Aortic valve regurgitation is not visualized. Severe aortic valve stenosis. Mean gradient 53 mmHg with AVA 0.57 cm^2. 12. The inferior vena cava is normal in size with greater than 50% respiratory variability, suggesting right atrial pressure of 3 mmHg. 13. The tricuspid regurgitant velocity is 2.47 m/s, and with an assumed right atrial pressure of 3 mmHg, the estimated right ventricular systolic  pressure is normal at 27.4 mmHg.  FINDINGS  Left Ventricle: Left ventricular ejection fraction, by visual estimation, is 60 to 65%. The left ventricle has normal function. Definity contrast agent was given IV to delineate the left ventricular endocardial borders. The left ventricle has no  regional wall motion abnormalities. There is mildly increased left ventricular hypertrophy. Left ventricular diastolic parameters are consistent with Grade I diastolic dysfunction (impaired relaxation).  Right Ventricle: The right ventricular size is normal. No increase in right ventricular wall thickness. Global RV systolic function is has normal systolic function. The tricuspid regurgitant velocity is 2.47 m/s, and with an assumed right atrial pressure  of 3 mmHg, the estimated right ventricular systolic pressure is normal at 27.4 mmHg.  Left Atrium: Left atrial size was normal in size.  Right Atrium: Right atrial size was normal in size  Pericardium: There is no evidence of pericardial effusion.  Mitral Valve: The mitral valve is normal in structure. There is mild calcification of the mitral valve leaflet(s). Mild to moderate mitral annular calcification. No evidence of mitral valve stenosis by observation. No evidence of mitral valve  regurgitation.  Tricuspid Valve: The tricuspid valve is normal in structure. Tricuspid valve regurgitation is trivial.  Aortic Valve: The aortic valve is tricuspid. Aortic valve regurgitation is not visualized. Severe aortic stenosis is present. Aortic valve mean gradient measures 50.6 mmHg. Aortic valve peak gradient measures 77.3 mmHg. Aortic valve area, by VTI measures  0.57 cm.  Pulmonic Valve: The pulmonic valve was normal in structure. Pulmonic valve regurgitation is not visualized.  Aorta: The aortic root is normal in size and structure.  Venous: The inferior vena cava is normal in size with greater than 50% respiratory variability, suggesting right  atrial pressure of 3 mmHg.  IAS/Shunts: No atrial level shunt detected by color flow Doppler. Agitated saline contrast was given intravenously to evaluate for intracardiac shunting.     LEFT VENTRICLE PLAX 2D LVIDd:         4.00 cm  Diastology LVIDs:         2.80 cm  LV e' lateral:   7.72 cm/s LV PW:         1.10 cm  LV E/e' lateral: 11.6 LV IVS:        1.20 cm  LV e' medial:    8.38 cm/s LVOT diam:     2.00 cm  LV E/e' medial:  10.7 LV SV:         40 ml LV SV Index:   17.94 LVOT Area:     3.14 cm    LEFT ATRIUM           Index LA diam:      4.20 cm 1.93 cm/m LA Vol (A4C): 53.5 ml 24.57 ml/m  AORTIC VALVE AV Area (Vmax):    0.65 cm AV Area (Vmean):   0.57 cm AV Area (VTI):     0.57 cm AV Vmax:           439.60 cm/s AV Vmean:          342.000 cm/s AV VTI:            0.973 m AV Peak Grad:      77.3 mmHg AV Mean Grad:      50.6 mmHg LVOT Vmax:         90.80 cm/s LVOT Vmean:        62.200 cm/s LVOT VTI:          0.177 m LVOT/AV VTI ratio: 0.18   AORTA Ao Root diam: 3.00 cm  MITRAL VALVE                         TRICUSPID VALVE MV Area (PHT): 3.65 cm              TR Peak grad:   24.4 mmHg MV PHT:        60.32 msec            TR Vmax:        247.00 cm/s MV Decel Time: 208 msec MV E velocity: 89.40 cm/s  103 cm/s  SHUNTS MV A velocity: 130.00 cm/s 70.3 cm/s Systemic VTI:  0.18 m MV E/A ratio:  0.69        1.5       Systemic Diam: 2.00 cm Recent Labs: 12/05/2018: ALT 21; BUN 25; Creatinine, Ser 1.00; Hemoglobin 13.3; Platelets 151; Potassium 4.3; Sodium 140   Lipid Panel    Component Value Date/Time   CHOL 116 12/06/2018 1238   TRIG 25 12/06/2018 1238   HDL 45 12/06/2018 1238   CHOLHDL 2.6 12/06/2018 1238   VLDL 5 12/06/2018 1238   LDLCALC 66 12/06/2018 1238     Wt Readings from Last 3 Encounters:  02/06/19 218 lb (98.9 kg)  02/01/19 218 lb 6.4 oz (99.1 kg)  01/23/19 219 lb (99.3 kg)     Other studies Reviewed: Additional studies/ records that were  reviewed today include: echo images, office notes. Review of the above records demonstrates: severe AS   Assessment and Plan:   1. Severe Aortic Valve Stenosis: He has severe, stage D aortic valve stenosis. I have personally reviewed the echo images. The aortic valve is thickened, calcified with limited leaflet mobility. I think he would benefit from AVR. Given advanced age, he is not a good candidate for conventional AVR by surgical approach. I think he  may be a good candidate for TAVR.   STS Risk Score: Risk of Mortality: 1.784% Renal Failure: 3.266% Permanent Stroke: 3.369% Prolonged Ventilation: 7.939% DSW Infection: 0.143% Reoperation: 5.948% Morbidity or Mortality: 14.356% Short Length of Stay: 23.696% Long Length of Stay: 7.269%  I have reviewed the natural history of aortic stenosis with the patient and their family members  who are present today. We have discussed the limitations of medical therapy and the  poor prognosis associated with symptomatic aortic stenosis. We have reviewed potential treatment options, including palliative medical therapy, conventional surgical aortic valve replacement, and transcatheter aortic valve replacement. We discussed treatment options in the context of the patient's specific comorbid medical conditions.   He would like to proceed with planning for TAVR.  We will delay cath for now given the recent surge in Covid-19 in the hospital. I would anticipate cath in 3-4 weeks unless his symptoms worsen. We will call him in 3 weeks to arrange the cath if this is felt to be acceptable based on the Covid 19 numbers at the hospital. Risks and benefits of the cath procedure and the valve procedure are reviewed with the patient. After the cath, he will have a cardiac CT, CTA of the chest/abdomen and pelvis, carotid artery dopplers, PT assessment and will then be referred to see one of the CT surgeons on our TAVR team.      Current medicines are reviewed  at length with the patient today.  The patient does not have concerns regarding medicines.  The following changes have been made:  no change  Labs/ tests ordered today include:  No orders of the defined types were placed in this encounter.    Disposition:   FU with the valve team.    Signed, Lauree Chandler, MD 02/06/2019 1:38 PM    [address], [place], [place]  [address] Phone: [phone]; Fax: [phone]

## 2019-02-05 NOTE — Progress Notes (Signed)
Structural Heart Clinic Consult Note  Chief Complaint  Patient presents with  . New Patient (Initial Visit)    Severe aortic stenosis   History of Present Illness: 79 yo male with history of CAD, GERD, remote atrial fibrillation, PE, hypothyroidism, TIA November 2020 and severe aortic stenosis, who is referred today by Dr. [name], for further evaluation of his aortic stenosis and possible TAVR. He was seen for neurology consultation in November by Dr. Lorraine Lax for suspected TIA, He had a negative workup for acute stroke with brain MRI and cranial CTA. He was started on aspirin and Plavix, although there was some discussion about anticoagulation given reported remote history of atrial fibrillation and CHA2DS2-VASc score of at least 4. He is now off of Plavix. He had COVID-19 in November and received an outpatient bamlanivimab infusion. Records indicate cardiology consultation through the [facility] system in March 2019 when he was seen by Dr. Tobe Sos.  He was felt to have moderate aortic stenosis at that time, cleared preoperatively for hip surgery. He was seen by Dr. [name] 01/23/19. Echo 12/06/18 with LVEF=60-65%, mild LVH. The aortic valve leaflets are thickened and calcified with limited leaflet excursion. Mean gradient 53 mmHg, peak gradient 77 mmHg,  AVA 0.57 cm2, dimensionless index of 0.18.    He tells me today that he has had progressive dyspnea. No chest pain, dizziness, near syncope, syncope or LE edema. He sees a dentist regularly and has no active dental issues. He lives in [place]. He is retired from [employer]. His wife died a few years back. He is here today with his girlfriend.   Primary Care Physician: Antony Contras, MD Primary Cardiologist: [name] Referring Cardiologist: [name]  Past Medical History:  Diagnosis Date  . Acute pancreatitis 2017   [facility]  . Aortic valve stenosis   . Arthritis   . Atherosclerotic coronary vascular disease 05/07/2011   Minimal  nonobstructive atherosclerotic coronary disease  . GERD (gastroesophageal reflux disease)   . History of anemia   . History of atrial fibrillation 2013   Reportedly limited episode  . History of kidney stones   . History of MRSA infection   . History of pulmonary embolism 2017  . Hypothyroidism   . Pancreatitis 05/2015  . TIA (transient ischemic attack)    November 2020    Past Surgical History:  Procedure Laterality Date  . BACK SURGERY    . CHOLECYSTECTOMY    . COLONOSCOPY  07/08/2011   Procedure: COLONOSCOPY;  Surgeon: Rogene Houston, MD;  Location: [facility]ITE;  Service: Endoscopy;  Laterality: N/A;  930  . DUODENOTOMY  09/29/2016   repair of fistula, jejunal resection  . INGUINAL HERNIA REPAIR N/A 03/01/2018   Procedure: LAPAROSCOPIC TEP BILATERAL INGUINAL HERNIA REPAIR;  Surgeon: Johnathan Hausen, MD;  Location: [facility] ORS;  Service: General;  Laterality: N/A;  . JEJUNOSTOMY FEEDING TUBE    . KNEE ARTHROSCOPY Left   . LEFT HEART CATHETERIZATION WITH CORONARY ANGIOGRAM N/A 05/07/2011   Procedure: LEFT HEART CATHETERIZATION WITH CORONARY ANGIOGRAM;  Surgeon: Peter M Martinique, MD;  Location: [facility] CATH LAB;  Service: Cardiovascular;  Laterality: N/A;  . PEG TUBE PLACEMENT    . VASECTOMY      Current Outpatient Medications  Medication Sig Dispense Refill  . amoxicillin (AMOXIL) 500 MG capsule Take 4 capsules by mouth as needed. Take 4 capsules 1 hour prior dental procedures.    [name] Kitchen aspirin 81 MG chewable tablet Chew 1 tablet (81 mg total) by mouth daily.  30 tablet 0  . atorvastatin (LIPITOR) 10 MG tablet Take 1 tablet (10 mg total) by mouth daily. 30 tablet 6  . levothyroxine (SYNTHROID, LEVOTHROID) 112 MCG tablet Take 224 mcg by mouth daily before breakfast.      No current facility-administered medications for this visit.    No Known Allergies  Social History   Socioeconomic History  . Marital status: Married    Spouse name: [name]  . Number of children: Not on file  . Years  of education: Not on file  . Highest education level: High school graduate  Occupational History    Comment: retired   Tobacco Use  . Smoking status: Never Smoker  . Smokeless tobacco: Never Used  Substance and Sexual Activity  . Alcohol use: No    Comment: none since Jun 26, 2015  . Drug use: No  . Sexual activity: Not on file  Other Topics Concern  . Not on file  Social History Narrative   Lives with wife   Caffeine- coffee 2 c daily, usually decaf   Social Determinants of Health   Financial Resource Strain:   . Difficulty of Paying Living Expenses: Not on file  Food Insecurity:   . Worried About Charity fundraiser in the Last Year: Not on file  . Ran Out of Food in the Last Year: Not on file  Transportation Needs:   . Lack of Transportation (Medical): Not on file  . Lack of Transportation (Non-Medical): Not on file  Physical Activity:   . Days of Exercise per Week: Not on file  . Minutes of Exercise per Session: Not on file  Stress:   . Feeling of Stress : Not on file  Social Connections:   . Frequency of Communication with Friends and Family: Not on file  . Frequency of Social Gatherings with Friends and Family: Not on file  . Attends Religious Services: Not on file  . Active Member of Clubs or Organizations: Not on file  . Attends Archivist Meetings: Not on file  . Marital Status: Not on file  Intimate Partner Violence:   . Fear of Current or Ex-Partner: Not on file  . Emotionally Abused: Not on file  . Physically Abused: Not on file  . Sexually Abused: Not on file    Family History  Problem Relation Age of Onset  . Arthritis Mother   . Other Brother        staph infection    Review of Systems:  As stated in the HPI and otherwise negative.   BP 116/68   Pulse 84   Ht 5\' 10"  (1.778 m)   Wt 218 lb (98.9 kg)   SpO2 96%   BMI 31.28 kg/m   Physical Examination: General: Well developed, well nourished, NAD  HEENT: OP clear, mucus membranes  moist  SKIN: warm, dry. No rashes. Neuro: No focal deficits  Musculoskeletal: Muscle strength 5/5 all ext  Psychiatric: Mood and affect normal  Neck: No JVD, no carotid bruits, no thyromegaly, no lymphadenopathy.  Lungs:Clear bilaterally, no wheezes, rhonci, crackles Cardiovascular: Regular rate and rhythm. Loud, harsh, late peaking systolic murmur.  Abdomen:Soft. Bowel sounds present. Non-tender.  Extremities: No lower extremity edema. Pulses are 2 + in the bilateral DP/PT.  EKG:  EKG is not ordered today. The ekg ordered today demonstrates   Echo 12/06/18:   1. Left ventricular ejection fraction, by visual estimation, is 60 to 65%. The left ventricle has normal function. There is mildly increased  left ventricular hypertrophy.  2. The left ventricle has no regional wall motion abnormalities.  3. Definity contrast agent was given IV to delineate the left ventricular endocardial borders.  4. Left ventricular diastolic parameters are consistent with Grade I diastolic dysfunction (impaired relaxation).  5. Global right ventricle has normal systolic function.The right ventricular size is normal. No increase in right ventricular wall thickness.  6. Left atrial size was normal.  7. Right atrial size was normal.  8. Mild to moderate mitral annular calcification.  9. The mitral valve is normal in structure. No evidence of mitral valve regurgitation. No evidence of mitral stenosis. 10. The tricuspid valve is normal in structure. Tricuspid valve regurgitation is trivial. 11. The aortic valve is tricuspid. Aortic valve regurgitation is not visualized. Severe aortic valve stenosis. Mean gradient 53 mmHg with AVA 0.57 cm^2. 12. The inferior vena cava is normal in size with greater than 50% respiratory variability, suggesting right atrial pressure of 3 mmHg. 13. The tricuspid regurgitant velocity is 2.47 m/s, and with an assumed right atrial pressure of 3 mmHg, the estimated right ventricular systolic  pressure is normal at 27.4 mmHg.  FINDINGS  Left Ventricle: Left ventricular ejection fraction, by visual estimation, is 60 to 65%. The left ventricle has normal function. Definity contrast agent was given IV to delineate the left ventricular endocardial borders. The left ventricle has no  regional wall motion abnormalities. There is mildly increased left ventricular hypertrophy. Left ventricular diastolic parameters are consistent with Grade I diastolic dysfunction (impaired relaxation).  Right Ventricle: The right ventricular size is normal. No increase in right ventricular wall thickness. Global RV systolic function is has normal systolic function. The tricuspid regurgitant velocity is 2.47 m/s, and with an assumed right atrial pressure  of 3 mmHg, the estimated right ventricular systolic pressure is normal at 27.4 mmHg.  Left Atrium: Left atrial size was normal in size.  Right Atrium: Right atrial size was normal in size  Pericardium: There is no evidence of pericardial effusion.  Mitral Valve: The mitral valve is normal in structure. There is mild calcification of the mitral valve leaflet(s). Mild to moderate mitral annular calcification. No evidence of mitral valve stenosis by observation. No evidence of mitral valve  regurgitation.  Tricuspid Valve: The tricuspid valve is normal in structure. Tricuspid valve regurgitation is trivial.  Aortic Valve: The aortic valve is tricuspid. Aortic valve regurgitation is not visualized. Severe aortic stenosis is present. Aortic valve mean gradient measures 50.6 mmHg. Aortic valve peak gradient measures 77.3 mmHg. Aortic valve area, by VTI measures  0.57 cm.  Pulmonic Valve: The pulmonic valve was normal in structure. Pulmonic valve regurgitation is not visualized.  Aorta: The aortic root is normal in size and structure.  Venous: The inferior vena cava is normal in size with greater than 50% respiratory variability, suggesting right  atrial pressure of 3 mmHg.  IAS/Shunts: No atrial level shunt detected by color flow Doppler. Agitated saline contrast was given intravenously to evaluate for intracardiac shunting.     LEFT VENTRICLE PLAX 2D LVIDd:         4.00 cm  Diastology LVIDs:         2.80 cm  LV e' lateral:   7.72 cm/s LV PW:         1.10 cm  LV E/e' lateral: 11.6 LV IVS:        1.20 cm  LV e' medial:    8.38 cm/s LVOT diam:     2.00 cm  LV E/e' medial:  10.7 LV SV:         40 ml LV SV Index:   17.94 LVOT Area:     3.14 cm    LEFT ATRIUM           Index LA diam:      4.20 cm 1.93 cm/m LA Vol (A4C): 53.5 ml 24.57 ml/m  AORTIC VALVE AV Area (Vmax):    0.65 cm AV Area (Vmean):   0.57 cm AV Area (VTI):     0.57 cm AV Vmax:           439.60 cm/s AV Vmean:          342.000 cm/s AV VTI:            0.973 m AV Peak Grad:      77.3 mmHg AV Mean Grad:      50.6 mmHg LVOT Vmax:         90.80 cm/s LVOT Vmean:        62.200 cm/s LVOT VTI:          0.177 m LVOT/AV VTI ratio: 0.18   AORTA Ao Root diam: 3.00 cm  MITRAL VALVE                         TRICUSPID VALVE MV Area (PHT): 3.65 cm              TR Peak grad:   24.4 mmHg MV PHT:        60.32 msec            TR Vmax:        247.00 cm/s MV Decel Time: 208 msec MV E velocity: 89.40 cm/s  103 cm/s  SHUNTS MV A velocity: 130.00 cm/s 70.3 cm/s Systemic VTI:  0.18 m MV E/A ratio:  0.69        1.5       Systemic Diam: 2.00 cm Recent Labs: 12/05/2018: ALT 21; BUN 25; Creatinine, Ser 1.00; Hemoglobin 13.3; Platelets 151; Potassium 4.3; Sodium 140   Lipid Panel    Component Value Date/Time   CHOL 116 12/06/2018 1238   TRIG 25 12/06/2018 1238   HDL 45 12/06/2018 1238   CHOLHDL 2.6 12/06/2018 1238   VLDL 5 12/06/2018 1238   LDLCALC 66 12/06/2018 1238     Wt Readings from Last 3 Encounters:  02/06/19 218 lb (98.9 kg)  02/01/19 218 lb 6.4 oz (99.1 kg)  01/23/19 219 lb (99.3 kg)     Other studies Reviewed: Additional studies/ records that were  reviewed today include: echo images, office notes. Review of the above records demonstrates: severe AS   Assessment and Plan:   1. Severe Aortic Valve Stenosis: He has severe, stage D aortic valve stenosis. I have personally reviewed the echo images. The aortic valve is thickened, calcified with limited leaflet mobility. I think he would benefit from AVR. Given advanced age, he is not a good candidate for conventional AVR by surgical approach. I think he  may be a good candidate for TAVR.   STS Risk Score: Risk of Mortality: 1.784% Renal Failure: 3.266% Permanent Stroke: 3.369% Prolonged Ventilation: 7.939% DSW Infection: 0.143% Reoperation: 5.948% Morbidity or Mortality: 14.356% Short Length of Stay: 23.696% Long Length of Stay: 7.269%  I have reviewed the natural history of aortic stenosis with the patient and their family members  who are present today. We have discussed the limitations of medical therapy and the  poor prognosis associated with symptomatic aortic stenosis. We have reviewed potential treatment options, including palliative medical therapy, conventional surgical aortic valve replacement, and transcatheter aortic valve replacement. We discussed treatment options in the context of the patient's specific comorbid medical conditions.   He would like to proceed with planning for TAVR.  We will delay cath for now given the recent surge in Covid-19 in the hospital. I would anticipate cath in 3-4 weeks unless his symptoms worsen. We will call him in 3 weeks to arrange the cath if this is felt to be acceptable based on the Covid 19 numbers at the hospital. Risks and benefits of the cath procedure and the valve procedure are reviewed with the patient. After the cath, he will have a cardiac CT, CTA of the chest/abdomen and pelvis, carotid artery dopplers, PT assessment and will then be referred to see one of the CT surgeons on our TAVR team.      Current medicines are reviewed  at length with the patient today.  The patient does not have concerns regarding medicines.  The following changes have been made:  no change  Labs/ tests ordered today include:  No orders of the defined types were placed in this encounter.    Disposition:   FU with the valve team.    Signed, Lauree Chandler, MD 02/06/2019 1:38 PM    [place]  [address], [place], [place]  [address] Phone: [phone]; Fax: [phone]

## 2019-02-06 ENCOUNTER — Ambulatory Visit: Payer: Medicare Other | Admitting: Cardiovascular Disease

## 2019-02-06 ENCOUNTER — Other Ambulatory Visit: Payer: Self-pay

## 2019-02-06 ENCOUNTER — Encounter: Payer: Self-pay | Admitting: Cardiovascular Disease

## 2019-02-06 VITALS — BP 116/68 | HR 84 | Ht 70.0 in | Wt 218.0 lb

## 2019-02-06 DIAGNOSIS — I350 Nonrheumatic aortic (valve) stenosis: Secondary | ICD-10-CM | POA: Diagnosis not present

## 2019-02-06 NOTE — Patient Instructions (Signed)
Medication Instructions:  No changes today *If you need a refill on your cardiac medications before your next appointment, please call your pharmacy*  Lab Work: none If you have labs (blood work) drawn today and your tests are completely normal, you will receive your results only by: [name] Kitchen MyChart Message (if you have MyChart) OR . A paper copy in the mail If you have any lab test that is abnormal or we need to change your treatment, we will call you to review the results.  Testing/Procedures: None today  Follow-Up: Theodosia Quay, RN, Structural Heart Nurse Navigator will contact you to arrange the next steps in this process.   Other Instructions

## 2019-02-07 DIAGNOSIS — E039 Hypothyroidism, unspecified: Secondary | ICD-10-CM | POA: Diagnosis not present

## 2019-02-07 DIAGNOSIS — I350 Nonrheumatic aortic (valve) stenosis: Secondary | ICD-10-CM | POA: Diagnosis not present

## 2019-02-07 DIAGNOSIS — E7800 Pure hypercholesterolemia, unspecified: Secondary | ICD-10-CM | POA: Diagnosis not present

## 2019-02-07 DIAGNOSIS — Z87898 Personal history of other specified conditions: Secondary | ICD-10-CM | POA: Diagnosis not present

## 2019-02-13 ENCOUNTER — Ambulatory Visit (INDEPENDENT_AMBULATORY_CARE_PROVIDER_SITE_OTHER): Payer: Medicare Other

## 2019-02-13 DIAGNOSIS — I4891 Unspecified atrial fibrillation: Secondary | ICD-10-CM | POA: Diagnosis not present

## 2019-02-16 ENCOUNTER — Other Ambulatory Visit: Payer: Self-pay | Admitting: *Deleted

## 2019-02-16 MED ORDER — ATORVASTATIN CALCIUM 10 MG PO TABS: 10.0000 mg | ORAL_TABLET | Freq: Every day | ORAL | 2 refills | Status: DC

## 2019-02-20 ENCOUNTER — Telehealth: Payer: Self-pay | Admitting: *Deleted

## 2019-02-20 NOTE — Telephone Encounter (Signed)
Patient informed and verbalized understanding

## 2019-02-20 NOTE — Telephone Encounter (Signed)
-----   Message from Satira Sark, MD sent at 02/20/2019  8:34 AM EST ----- Results reviewed.  14-day Zio patch reviewed.  No definite atrial fibrillation which was the main question for the test.  We had discussed a complete 30-day monitor, so would place an additional 14-day patch if possible.

## 2019-02-20 NOTE — Telephone Encounter (Signed)
Patient informed and verbalized understanding.  Patient says he has already placed the second monitor on that was sent out with the first 14 day monitor.

## 2019-02-20 NOTE — Telephone Encounter (Signed)
Patient says his heart valve surgery has been postponed due to covid. Patient says he wants to go to [place] next week and stay for 2 weeks. Patient wants to know if his cardiologist thinks this is safe.

## 2019-02-20 NOTE — Telephone Encounter (Signed)
It would be best to avoid any unnecessary travel at this time.

## 2019-02-22 ENCOUNTER — Telehealth: Payer: Self-pay | Admitting: Cardiovascular Disease

## 2019-02-22 DIAGNOSIS — I350 Nonrheumatic aortic (valve) stenosis: Secondary | ICD-10-CM

## 2019-02-22 DIAGNOSIS — Z01812 Encounter for preprocedural laboratory examination: Secondary | ICD-10-CM

## 2019-02-22 NOTE — Telephone Encounter (Signed)
Patient's friend Olegario Shearer is calling stating she heard [facility] is lifting the hold on all procedures and was wanting to know when Dr. Angelena Form was wanting to schedule the heart valve surgery the patient needs.

## 2019-02-23 ENCOUNTER — Encounter: Payer: Self-pay | Admitting: *Deleted

## 2019-02-23 NOTE — Telephone Encounter (Signed)
Patient follow up on when he can get scheduled for his surgery.

## 2019-02-23 NOTE — Telephone Encounter (Signed)
Spoke with patient. He is interested in moving forward with tavr work up soon as he and his family feel his breathing has worsened. I have scheduled him for R & L heart cath on 02/28/19 at 11:00 am with Dr. Angelena Form. He will have labs and covid screening prior in [place] on 02/24/19.  Message to precert.

## 2019-02-23 NOTE — Addendum Note (Signed)
Addended by: Lauree Chandler D on: 02/23/2019 04:24 PM   Modules accepted: Orders, SmartSet

## 2019-02-24 ENCOUNTER — Other Ambulatory Visit: Payer: Self-pay

## 2019-02-24 ENCOUNTER — Other Ambulatory Visit: Payer: Medicare Other

## 2019-02-24 ENCOUNTER — Other Ambulatory Visit (HOSPITAL_COMMUNITY)
Admission: RE | Admit: 2019-02-24 | Discharge: 2019-02-24 | Disposition: A | Discharge status: Home / Self Care | Payer: Medicare Other | Source: Ambulatory Visit | Attending: Cardiovascular Disease | Admitting: Cardiovascular Disease

## 2019-02-24 DIAGNOSIS — I350 Nonrheumatic aortic (valve) stenosis: Secondary | ICD-10-CM

## 2019-02-24 DIAGNOSIS — Z01812 Encounter for preprocedural laboratory examination: Secondary | ICD-10-CM | POA: Insufficient documentation

## 2019-02-24 DIAGNOSIS — Z20822 Contact with and (suspected) exposure to covid-19: Secondary | ICD-10-CM | POA: Insufficient documentation

## 2019-02-24 LAB — SARS CORONAVIRUS 2 (TAT 6-24 HRS): SARS Coronavirus 2: NEGATIVE

## 2019-02-25 LAB — CBC
Hematocrit: 39 % (ref 37.5–51.0)
Hemoglobin: 13 g/dL (ref 13.0–17.7)
MCH: 30 pg (ref 26.6–33.0)
MCHC: 33.3 g/dL (ref 31.5–35.7)
MCV: 90 fL (ref 79–97)
Platelets: 149 10*3/uL — ABNORMAL LOW (ref 150–450)
RBC: 4.33 x10E6/uL (ref 4.14–5.80)
RDW: 13.1 % (ref 11.6–15.4)
WBC: 5.5 10*3/uL (ref 3.4–10.8)

## 2019-02-25 LAB — BASIC METABOLIC PANEL
BUN/Creatinine Ratio: 18 (ref 10–24)
BUN: 21 mg/dL (ref 8–27)
CO2: 26 mmol/L (ref 20–29)
Calcium: 9.6 mg/dL (ref 8.6–10.2)
Chloride: 104 mmol/L (ref 96–106)
Creatinine, Ser: 1.18 mg/dL (ref 0.76–1.27)
GFR calc Af Amer: 67 mL/min/{1.73_m2} (ref >59)
GFR calc non Af Amer: 58 mL/min/{1.73_m2} — ABNORMAL LOW (ref >59)
Glucose: 114 mg/dL — ABNORMAL HIGH (ref 65–99)
Potassium: 4.5 mmol/L (ref 3.5–5.2)
Sodium: 143 mmol/L (ref 134–144)

## 2019-02-27 ENCOUNTER — Telehealth: Payer: Self-pay | Admitting: *Deleted

## 2019-02-27 NOTE — Telephone Encounter (Signed)
Pt contacted pre-catheterization scheduled at [facility] for: Tuesday February 28, 2019 11 AM Verified arrival time and place: [facility]) at: 9 AM   No solid food after midnight prior to cath, clear liquids until 5 AM day of procedure. Contrast allergy:no  AM meds can be  taken pre-cath with sip of water including: ASA 81 mg   Confirmed patient has responsible adult to drive home post procedure and observe 24 hours after arriving home: yes  Currently, due to Covid-19 pandemic, only one person will be allowed with patient. Must be the same person for patient's entire stay and will be required to wear a mask. They will be asked to wait in the waiting room for the duration of the patient's stay.  Patients are required to wear a mask when they enter the hospital.      COVID-19 Pre-Screening Questions:  . In the past 7 to 10 days have you had a cough,  shortness of breath, headache, congestion, fever (100 or greater) body aches, chills, sore throat, or sudden loss of taste or sense of smell? no . Have you been around anyone with known Covid 19? no  . Have you been around anyone who is awaiting Covid 19 test results in the past 7 to 10 days? no . Have you been around anyone who has been exposed to Covid 19, or has mentioned symptoms of Covid 19 within the past 7 to 10 days? no   I reviewed procedure/mask/visitor instructions, Covid-19 screening questions with patient, he verbalized understanding, thanked me for call.

## 2019-02-28 ENCOUNTER — Encounter (HOSPITAL_COMMUNITY)
Admission: RE | Disposition: A | Discharge status: Home / Self Care | Payer: Self-pay | Source: Home / Self Care | Attending: Cardiovascular Disease

## 2019-02-28 ENCOUNTER — Ambulatory Visit (HOSPITAL_COMMUNITY)
Admission: RE | Admit: 2019-02-28 | Discharge: 2019-02-28 | Disposition: A | Discharge status: Home / Self Care | Payer: Medicare Other | Attending: Cardiovascular Disease | Admitting: Cardiovascular Disease

## 2019-02-28 ENCOUNTER — Other Ambulatory Visit: Payer: Self-pay

## 2019-02-28 DIAGNOSIS — Z7982 Long term (current) use of aspirin: Secondary | ICD-10-CM | POA: Insufficient documentation

## 2019-02-28 DIAGNOSIS — Z79890 Hormone replacement therapy (postmenopausal): Secondary | ICD-10-CM | POA: Insufficient documentation

## 2019-02-28 DIAGNOSIS — Z8616 Personal history of COVID-19: Secondary | ICD-10-CM | POA: Insufficient documentation

## 2019-02-28 DIAGNOSIS — I2510 Atherosclerotic heart disease of native coronary artery without angina pectoris: Secondary | ICD-10-CM

## 2019-02-28 DIAGNOSIS — Z8673 Personal history of transient ischemic attack (TIA), and cerebral infarction without residual deficits: Secondary | ICD-10-CM | POA: Insufficient documentation

## 2019-02-28 DIAGNOSIS — I4891 Unspecified atrial fibrillation: Secondary | ICD-10-CM | POA: Diagnosis not present

## 2019-02-28 DIAGNOSIS — K219 Gastro-esophageal reflux disease without esophagitis: Secondary | ICD-10-CM | POA: Insufficient documentation

## 2019-02-28 DIAGNOSIS — I350 Nonrheumatic aortic (valve) stenosis: Secondary | ICD-10-CM | POA: Insufficient documentation

## 2019-02-28 DIAGNOSIS — E039 Hypothyroidism, unspecified: Secondary | ICD-10-CM | POA: Diagnosis not present

## 2019-02-28 DIAGNOSIS — Z79899 Other long term (current) drug therapy: Secondary | ICD-10-CM | POA: Insufficient documentation

## 2019-02-28 DIAGNOSIS — Z86711 Personal history of pulmonary embolism: Secondary | ICD-10-CM | POA: Diagnosis not present

## 2019-02-28 HISTORY — PX: RIGHT/LEFT HEART CATH AND CORONARY ANGIOGRAPHY: CATH118266

## 2019-02-28 LAB — POCT I-STAT EG7
Acid-Base Excess: 1 mmol/L (ref 0.0–2.0)
Bicarbonate: 26.3 mmol/L (ref 20.0–28.0)
Calcium, Ion: 1.32 mmol/L (ref 1.15–1.40)
HCT: 35 % — ABNORMAL LOW (ref 39.0–52.0)
Hemoglobin: 11.9 g/dL — ABNORMAL LOW (ref 13.0–17.0)
O2 Saturation: 67 %
Potassium: 4.3 mmol/L (ref 3.5–5.1)
Sodium: 142 mmol/L (ref 135–145)
TCO2: 28 mmol/L (ref 22–32)
pCO2, Ven: 43.7 mmHg — ABNORMAL LOW (ref 44.0–60.0)
pH, Ven: 7.388 (ref 7.250–7.430)
pO2, Ven: 36 mmHg (ref 32.0–45.0)

## 2019-02-28 LAB — POCT I-STAT 7, (LYTES, BLD GAS, ICA,H+H)
Bicarbonate: 24.4 mmol/L (ref 20.0–28.0)
Calcium, Ion: 1.19 mmol/L (ref 1.15–1.40)
HCT: 33 % — ABNORMAL LOW (ref 39.0–52.0)
Hemoglobin: 11.2 g/dL — ABNORMAL LOW (ref 13.0–17.0)
O2 Saturation: 99 %
Potassium: 4.1 mmol/L (ref 3.5–5.1)
Sodium: 143 mmol/L (ref 135–145)
TCO2: 25 mmol/L (ref 22–32)
pCO2 arterial: 36.2 mmHg (ref 32.0–48.0)
pH, Arterial: 7.437 (ref 7.350–7.450)
pO2, Arterial: 135 mmHg — ABNORMAL HIGH (ref 83.0–108.0)

## 2019-02-28 SURGERY — RIGHT/LEFT HEART CATH AND CORONARY ANGIOGRAPHY
Anesthesia: LOCAL

## 2019-02-28 MED ORDER — FENTANYL CITRATE (PF) 100 MCG/2ML IJ SOLN
INTRAMUSCULAR | Status: DC | PRN
  Administered 2019-02-28: 25 ug via INTRAVENOUS

## 2019-02-28 MED ORDER — SODIUM CHLORIDE 0.9% FLUSH: 3.0000 mL | INTRAVENOUS | Status: DC | PRN

## 2019-02-28 MED ORDER — ASPIRIN 81 MG PO CHEW: 81.0000 mg | CHEWABLE_TABLET | ORAL | Status: DC

## 2019-02-28 MED ORDER — SODIUM CHLORIDE 0.9 % IV SOLN: 250.0000 mL | INTRAVENOUS | Status: DC | PRN

## 2019-02-28 MED ORDER — LIDOCAINE HCL (PF) 1 % IJ SOLN
INTRAMUSCULAR | Status: AC
  Filled 2019-02-28: qty 30

## 2019-02-28 MED ORDER — MIDAZOLAM HCL 2 MG/2ML IJ SOLN
INTRAMUSCULAR | Status: DC | PRN
  Administered 2019-02-28: 1 mg via INTRAVENOUS

## 2019-02-28 MED ORDER — HEPARIN (PORCINE) IN NACL 1000-0.9 UT/500ML-% IV SOLN
INTRAVENOUS | Status: AC
  Filled 2019-02-28: qty 1000

## 2019-02-28 MED ORDER — HYDRALAZINE HCL 20 MG/ML IJ SOLN: 10.0000 mg | INTRAMUSCULAR | Status: DC | PRN

## 2019-02-28 MED ORDER — METOPROLOL TARTRATE 50 MG PO TABS: ORAL_TABLET | ORAL | 0 refills | Status: DC

## 2019-02-28 MED ORDER — FENTANYL CITRATE (PF) 100 MCG/2ML IJ SOLN
INTRAMUSCULAR | Status: AC
  Filled 2019-02-28: qty 2

## 2019-02-28 MED ORDER — SODIUM CHLORIDE 0.9 % WEIGHT BASED INFUSION: 1.0000 mL/kg/h | INTRAVENOUS | Status: DC

## 2019-02-28 MED ORDER — VERAPAMIL HCL 2.5 MG/ML IV SOLN
INTRAVENOUS | Status: DC | PRN
  Administered 2019-02-28: 13:00:00 10 mL via INTRA_ARTERIAL

## 2019-02-28 MED ORDER — SODIUM CHLORIDE 0.9 % IV SOLN: INTRAVENOUS | Status: AC

## 2019-02-28 MED ORDER — LABETALOL HCL 5 MG/ML IV SOLN: 10.0000 mg | INTRAVENOUS | Status: DC | PRN

## 2019-02-28 MED ORDER — ONDANSETRON HCL 4 MG/2ML IJ SOLN: 4.0000 mg | Freq: Four times a day (QID) | INTRAMUSCULAR | Status: DC | PRN

## 2019-02-28 MED ORDER — IOHEXOL 350 MG/ML SOLN
INTRAVENOUS | Status: DC | PRN
  Administered 2019-02-28: 13:00:00 60 mL

## 2019-02-28 MED ORDER — VERAPAMIL HCL 2.5 MG/ML IV SOLN
INTRAVENOUS | Status: AC
  Filled 2019-02-28: qty 2

## 2019-02-28 MED ORDER — HEPARIN (PORCINE) IN NACL 1000-0.9 UT/500ML-% IV SOLN
INTRAVENOUS | Status: DC | PRN
  Administered 2019-02-28: 500 mL

## 2019-02-28 MED ORDER — ACETAMINOPHEN 325 MG PO TABS: 650.0000 mg | ORAL_TABLET | ORAL | Status: DC | PRN

## 2019-02-28 MED ORDER — SODIUM CHLORIDE 0.9% FLUSH: 3.0000 mL | Freq: Two times a day (BID) | INTRAVENOUS | Status: DC

## 2019-02-28 MED ORDER — MIDAZOLAM HCL 2 MG/2ML IJ SOLN
INTRAMUSCULAR | Status: AC
  Filled 2019-02-28: qty 2

## 2019-02-28 MED ORDER — LIDOCAINE HCL (PF) 1 % IJ SOLN
INTRAMUSCULAR | Status: DC | PRN
  Administered 2019-02-28 (×2): 3 mL

## 2019-02-28 MED ORDER — HEPARIN SODIUM (PORCINE) 1000 UNIT/ML IJ SOLN
INTRAMUSCULAR | Status: AC
  Filled 2019-02-28: qty 1

## 2019-02-28 MED ORDER — HEPARIN SODIUM (PORCINE) 1000 UNIT/ML IJ SOLN
INTRAMUSCULAR | Status: DC | PRN
  Administered 2019-02-28: 5000 [IU] via INTRAVENOUS

## 2019-02-28 MED ORDER — SODIUM CHLORIDE 0.9 % WEIGHT BASED INFUSION
3.0000 mL/kg/h | INTRAVENOUS | Status: AC
  Administered 2019-02-28: 10:00:00 3 mL/kg/h via INTRAVENOUS

## 2019-02-28 SURGICAL SUPPLY — 13 items
CATH 5FR JL3.5 JR4 ANG PIG MP (CATHETERS) ×2 IMPLANT
CATH BALLN WEDGE 5F 110CM (CATHETERS) ×2 IMPLANT
CATH INFINITI 5FR AL1 (CATHETERS) ×2 IMPLANT
DEVICE RAD COMP TR BAND LRG (VASCULAR PRODUCTS) ×2 IMPLANT
GLIDESHEATH SLEND SS 6F .021 (SHEATH) ×2 IMPLANT
GUIDEWIRE .025 260CM (WIRE) ×2 IMPLANT
GUIDEWIRE INQWIRE 1.5J.035X260 (WIRE) ×1 IMPLANT
INQWIRE 1.5J .035X260CM (WIRE) ×2
KIT HEART LEFT (KITS) ×2 IMPLANT
PACK CARDIAC CATHETERIZATION (CUSTOM PROCEDURE TRAY) ×2 IMPLANT
SHEATH GLIDE SLENDER 4/5FR (SHEATH) ×2 IMPLANT
TRANSDUCER W/STOPCOCK (MISCELLANEOUS) ×2 IMPLANT
TUBING CIL FLEX 10 FLL-RA (TUBING) ×2 IMPLANT

## 2019-02-28 NOTE — Discharge Instructions (Signed)
Keep right wrist elevated at heart level for 24 hours and drink plenty of fluids for 48 hours  Radial Site Care  This sheet gives you information about how to care for yourself after your procedure. Your health care provider may also give you more specific instructions. If you have problems or questions, contact your health care provider. What can I expect after the procedure? After the procedure, it is common to have:  Bruising and tenderness at the catheter insertion area. Follow these instructions at home: Medicines  Take over-the-counter and prescription medicines only as told by your health care provider. Insertion site care  Follow instructions from your health care provider about how to take care of your insertion site. Make sure you: ? Wash your hands with soap and water before you change your bandage (dressing). If soap and water are not available, use hand sanitizer. ? Remove your dressing as told by your health care provider. In 24-48 hours  Check your insertion site every day for signs of infection. Check for: ? Redness, swelling, or pain. ? Fluid or blood. ? Pus or a bad smell. ? Warmth.  Do not take baths, swim, or use a hot tub until your health care provider approves.  You may shower 24-48 hours after the procedure, or as directed by your health care provider. ? Remove the dressing and gently wash the site with plain soap and water. ? Pat the area dry with a clean towel. ? Do not rub the site. That could cause bleeding.  Do not apply powder or lotion to the site. Activity   For 24 hours after the procedure, or as directed by your health care provider: ? Do not flex or bend the affected arm. ? Do not push or pull heavy objects with the affected arm. ? Do not drive yourself home from the hospital or clinic. You may drive 24 hours after the procedure unless your health care provider tells you not to. ? Do not operate machinery or power tools.  Do not lift  anything that is heavier than 10 lb (4.5 kg), or the limit that you are told, until your health care provider says that it is safe. For 4 days  Ask your health care provider when it is okay to: ? Return to work or school. ? Resume usual physical activities or sports. ? Resume sexual activity. General instructions  If the catheter site starts to bleed, raise your arm and put firm pressure on the site. If the bleeding does not stop, get help right away. This is a medical emergency.  If you went home on the same day as your procedure, a responsible adult should be with you for the first 24 hours after you arrive home.  Keep all follow-up visits as told by your health care provider. This is important. Contact a health care provider if:  You have a fever.  You have redness, swelling, or yellow drainage around your insertion site. Get help right away if:  You have unusual pain at the radial site.  The catheter insertion area swells very fast.  The insertion area is bleeding, and the bleeding does not stop when you hold steady pressure on the area.  Your arm or hand becomes pale, cool, tingly, or numb. These symptoms may represent a serious problem that is an emergency. Do not wait to see if the symptoms will go away. Get medical help right away. Call your local emergency services (911 in the [place] Do not drive  yourself to the hospital. Summary  After the procedure, it is common to have bruising and tenderness at the site.  Follow instructions from your health care provider about how to take care of your radial site wound. Check the wound every day for signs of infection.  Do not lift anything that is heavier than 10 lb (4.5 kg), or the limit that you are told, until your health care provider says that it is safe. This information is not intended to replace advice given to you by your health care provider. Make sure you discuss any questions you have with your health care  provider. Document Revised: 02/17/2017 Document Reviewed: 02/17/2017 Elsevier Patient Education  2020 [employer].

## 2019-02-28 NOTE — Interval H&P Note (Signed)
History and Physical Interval Note:  02/28/2019 10:14 AM  [name]  has presented today for surgery, with the diagnosis of Aortic stenosis.  The various methods of treatment have been discussed with the patient and family. After consideration of risks, benefits and other options for treatment, the patient has consented to  Procedure(s): RIGHT/LEFT HEART CATH AND CORONARY ANGIOGRAPHY (N/A) as a surgical intervention.  The patient's history has been reviewed, patient examined, no change in status, stable for surgery.  I have reviewed the patient's chart and labs.  Questions were answered to the patient's satisfaction.    Cath Lab Visit (complete for each Cath Lab visit)  Clinical Evaluation Leading to the Procedure:   ACS: No.  Non-ACS:    Anginal Classification: CCS II  Anti-ischemic medical therapy: No Therapy  Non-Invasive Test Results: No non-invasive testing performed  Prior CABG: No previous CABG        Lauree Chandler

## 2019-03-02 ENCOUNTER — Telehealth: Payer: Self-pay

## 2019-03-02 NOTE — Telephone Encounter (Signed)
Returning your call. He saw where he rec'd a call, he has not listened to his voice mail.  Thanks renee

## 2019-03-02 NOTE — Telephone Encounter (Signed)
Patient listened to message and it was not a message from [place].

## 2019-03-03 ENCOUNTER — Ambulatory Visit (HOSPITAL_COMMUNITY): Payer: Medicare Other

## 2019-03-03 ENCOUNTER — Ambulatory Visit: Payer: Medicare Other | Admitting: Physical Therapy

## 2019-03-03 ENCOUNTER — Encounter (HOSPITAL_COMMUNITY): Payer: Medicare Other

## 2019-03-06 ENCOUNTER — Encounter: Payer: Medicare Other | Admitting: Thoracic Surgery (Cardiothoracic Vascular Surgery)

## 2019-03-06 ENCOUNTER — Other Ambulatory Visit: Payer: Self-pay

## 2019-03-06 ENCOUNTER — Ambulatory Visit (HOSPITAL_BASED_OUTPATIENT_CLINIC_OR_DEPARTMENT_OTHER)
Admit: 2019-03-06 | Discharge: 2019-03-06 | Disposition: A | Discharge status: Home / Self Care | Payer: Medicare Other | Attending: Cardiovascular Disease | Admitting: Cardiovascular Disease

## 2019-03-06 ENCOUNTER — Ambulatory Visit: Payer: Medicare Other | Admitting: Physical Therapy

## 2019-03-06 ENCOUNTER — Ambulatory Visit (HOSPITAL_COMMUNITY)
Admission: RE | Admit: 2019-03-06 | Discharge: 2019-03-06 | Disposition: A | Discharge status: Home / Self Care | Payer: Medicare Other | Source: Ambulatory Visit | Attending: Cardiovascular Disease | Admitting: Cardiovascular Disease

## 2019-03-06 ENCOUNTER — Telehealth: Payer: Self-pay | Admitting: Cardiovascular Disease

## 2019-03-06 DIAGNOSIS — N132 Hydronephrosis with renal and ureteral calculous obstruction: Secondary | ICD-10-CM | POA: Diagnosis not present

## 2019-03-06 DIAGNOSIS — I350 Nonrheumatic aortic (valve) stenosis: Secondary | ICD-10-CM

## 2019-03-06 DIAGNOSIS — I700 Atherosclerosis of aorta: Secondary | ICD-10-CM | POA: Diagnosis not present

## 2019-03-06 MED ORDER — IOHEXOL 350 MG/ML SOLN
100.0000 mL | Freq: Once | INTRAVENOUS | Status: AC | PRN
  Administered 2019-03-06: 10:00:00 120 mL via INTRAVENOUS

## 2019-03-06 NOTE — Telephone Encounter (Signed)
Carloyn Manner from [facility] radiology was calling back asking about the patient's report. He provided another phone number for the office to reach him

## 2019-03-06 NOTE — Telephone Encounter (Signed)
Spoke with [facility] Radiology Impression 3: obstructing 7 mm left UPJ stone w mild-mod left hydronephrosis.   Dr. Angelena Form informed.

## 2019-03-06 NOTE — Progress Notes (Signed)
Carotid artery duplex exam completed.  Preliminary results can be found under CV proc under chart review.  03/06/2019 9:26 AM  Zymier Rodgers, K., RDMS, RVT

## 2019-03-06 NOTE — Telephone Encounter (Signed)
No answer on 2 call backs to [facility] #.   Report is in [place]. Dr. Angelena Form has reviewed this scan.

## 2019-03-06 NOTE — Telephone Encounter (Signed)
Carloyn Manner from [facility] calling to get call report on patients CT's

## 2019-03-07 ENCOUNTER — Ambulatory Visit: Payer: Medicare Other | Admitting: Cardiology

## 2019-03-07 ENCOUNTER — Other Ambulatory Visit: Payer: Self-pay | Admitting: *Deleted

## 2019-03-07 ENCOUNTER — Encounter: Payer: Self-pay | Admitting: Cardiology

## 2019-03-07 ENCOUNTER — Other Ambulatory Visit: Payer: Self-pay

## 2019-03-07 VITALS — BP 123/70 | HR 78 | Temp 98.7°F | Ht 70.0 in | Wt 218.0 lb

## 2019-03-07 DIAGNOSIS — Z8679 Personal history of other diseases of the circulatory system: Secondary | ICD-10-CM | POA: Diagnosis not present

## 2019-03-07 DIAGNOSIS — N200 Calculus of kidney: Secondary | ICD-10-CM

## 2019-03-07 DIAGNOSIS — I4891 Unspecified atrial fibrillation: Secondary | ICD-10-CM

## 2019-03-07 DIAGNOSIS — I350 Nonrheumatic aortic (valve) stenosis: Secondary | ICD-10-CM | POA: Diagnosis not present

## 2019-03-07 DIAGNOSIS — I2510 Atherosclerotic heart disease of native coronary artery without angina pectoris: Secondary | ICD-10-CM | POA: Diagnosis not present

## 2019-03-07 DIAGNOSIS — Q6211 Congenital occlusion of ureteropelvic junction: Secondary | ICD-10-CM

## 2019-03-07 NOTE — Telephone Encounter (Signed)
I looked at the CT and with the obstructing stone, I would think at a minimum he would need a stent placed to relieve the obstruction.   Would he be able to have an anesthetic to have that done and if so would it be ok to try to do ureteroscopic management of the stone?   We can probably stent with a MAC but the Ureteroscopy would need GA.

## 2019-03-07 NOTE — Patient Instructions (Signed)
Medication Instructions:  Your physician recommends that you continue on your current medications as directed. Please refer to the Current Medication list given to you today.  *If you need a refill on your cardiac medications before your next appointment, please call your pharmacy*  Lab Work: None  If you have labs (blood work) drawn today and your tests are completely normal, you will receive your results only by: [name] Kitchen MyChart Message (if you have MyChart) OR . A paper copy in the mail If you have any lab test that is abnormal or we need to change your treatment, we will call you to review the results.  Testing/Procedures: None  Follow-Up: At [facility], you and your health needs are our priority.  As part of our continuing mission to provide you with exceptional heart care, we have created designated Provider Care Teams.  These Care Teams include your primary Cardiologist (physician) and Advanced Practice Providers (APPs -  Physician Assistants and Nurse Practitioners) who all work together to provide you with the care you need, when you need it.  Your next appointment:   3 month(s)  The format for your next appointment:   In Person  Provider:   Rozann Lesches, MD  Other Instructions None    Thank you for choosing [facility] !

## 2019-03-07 NOTE — Telephone Encounter (Signed)
  [facility]   MULTIDISCIPLINARY HEART VALVE TEAM   Incidental findings seen on 2/8 CTA of left renal stone with mild-moderate hydronephrosis discussed at Structural Heart Team meeting.  Pt was tentative for TAVR 2/16 but the pt will require evaluation and clearance from Urology before proceeding with surgery. Pt aware of results and denies hematuria, low back/side pain and fever.  The pt has seen Dr Jeffie Pollock with [facility] in the past and I have arranged evaluation on 03/10/19 at 11:30 AM. Pt verbalized understanding of plan.

## 2019-03-07 NOTE — Progress Notes (Signed)
Cardiology Office Note  Date: 03/07/2019   ID: Shahrukh, Horrocks Jun 06, 1940, MRN [id number]  PCP:  Antony Contras, MD  Cardiologist:  No primary care provider on file. Electrophysiologist:  None   Chief Complaint  Patient presents with  . Cardiac follow-up    History of Present Illness: Andrew Watson is a 79 y.o. male last seen in December 2020.  He was referred to the valve clinic for further assessment of aortic stenosis and potential for TAVR.  He was seen by Dr. Angelena Form, recent cardiac catheterization performed and demonstrated overall mild atherosclerosis within the major epicardial vessels and severe aortic stenosis as noted by echocardiography.  He tells me that he sees a cardiothoracic surgeon tomorrow and is tentatively scheduled for TAVR the following week.  He reports no major change in symptoms, specifically dyspnea on exertion.  No palpitations.  He did wear a cardiac monitor back in January, no obvious atrial fibrillation identified.  I reviewed his cardiac medications which are outlined below and stable.  Past Medical History:  Diagnosis Date  . Acute pancreatitis 2017   [facility]  . Aortic valve stenosis   . Arthritis   . CAD (coronary artery disease) 05/07/2011   Mild atherosclerosis by cardiac catheterization February 2021  . GERD (gastroesophageal reflux disease)   . History of anemia   . History of atrial fibrillation 2013   Reportedly limited episode  . History of kidney stones   . History of MRSA infection   . History of pulmonary embolism 2017  . Hypothyroidism   . Pancreatitis 05/2015  . TIA (transient ischemic attack)    November 2020    Past Surgical History:  Procedure Laterality Date  . BACK SURGERY    . CHOLECYSTECTOMY    . COLONOSCOPY  07/08/2011   Procedure: COLONOSCOPY;  Surgeon: Rogene Houston, MD;  Location: [facility];  Service: Endoscopy;  Laterality: N/A;  930  . DUODENOTOMY  09/29/2016   repair of fistula,  jejunal resection  . INGUINAL HERNIA REPAIR N/A 03/01/2018   Procedure: LAPAROSCOPIC TEP BILATERAL INGUINAL HERNIA REPAIR;  Surgeon: Johnathan Hausen, MD;  Location: [facility] ORS;  Service: General;  Laterality: N/A;  . JEJUNOSTOMY FEEDING TUBE    . KNEE ARTHROSCOPY Left   . LEFT HEART CATHETERIZATION WITH CORONARY ANGIOGRAM N/A 05/07/2011   Procedure: LEFT HEART CATHETERIZATION WITH CORONARY ANGIOGRAM;  Surgeon: Peter M Martinique, MD;  Location: [facility] CATH LAB;  Service: Cardiovascular;  Laterality: N/A;  . PEG TUBE PLACEMENT    . RIGHT/LEFT HEART CATH AND CORONARY ANGIOGRAPHY N/A 02/28/2019   Procedure: RIGHT/LEFT HEART CATH AND CORONARY ANGIOGRAPHY;  Surgeon: Burnell Blanks, MD;  Location: [facility] CV LAB;  Service: Cardiovascular;  Laterality: N/A;  . VASECTOMY      Current Outpatient Medications  Medication Sig Dispense Refill  . amoxicillin (AMOXIL) 500 MG capsule Take 2,000 mg by mouth See admin instructions. Take 4 capsules (2000 mg) 1 hour prior dental procedures.    [name] Kitchen aspirin 81 MG chewable tablet Chew 1 tablet (81 mg total) by mouth daily. 30 tablet 0  . atorvastatin (LIPITOR) 10 MG tablet Take 1 tablet (10 mg total) by mouth daily. 90 tablet 2  . levothyroxine (SYNTHROID, LEVOTHROID) 112 MCG tablet Take 224 mcg by mouth daily before breakfast.     . metoprolol tartrate (LOPRESSOR) 50 MG tablet Take one tablet by mouth at 8:30 AM on 03/06/2019 1 tablet 0   No current facility-administered medications for this  visit.   Allergies:  Patient has no known allergies.   ROS:  No palpitations, dizziness, or syncope.  Physical Exam: VS:  BP 123/70   Pulse 78   Temp 98.7 F (37.1 C)   Ht 5\' 10"  (1.778 m)   Wt 218 lb (98.9 kg)   BMI 31.28 kg/m , BMI Body mass index is 31.28 kg/m.  Wt Readings from Last 3 Encounters:  03/07/19 218 lb (98.9 kg)  02/28/19 220 lb (99.8 kg)  02/06/19 218 lb (98.9 kg)    General: Elderly male, appears comfortable at rest. HEENT: Conjunctiva and lids  normal, wearing a mask. Neck: Supple, no elevated JVP or carotid bruits, no thyromegaly. Lungs: Clear to auscultation, nonlabored breathing at rest. Cardiac: Regular rate and rhythm, no S3, 3/6 systolic murmur, no pericardial rub. Extremities: No pitting edema, distal pulses 2+.  ECG:  An ECG dated 02/28/2019 was personally reviewed today and demonstrated:  Sinus rhythm with increased voltage and decreased R wave progression.  Recent Labwork: 12/05/2018: ALT 21; AST 23 02/24/2019: BUN 21; Creatinine, Ser 1.18; Platelets 149 02/28/2019: Hemoglobin 11.9; Hemoglobin 11.2; Potassium 4.3; Potassium 4.1; Sodium 142; Sodium 143     Component Value Date/Time   CHOL 116 12/06/2018 1238   TRIG 25 12/06/2018 1238   HDL 45 12/06/2018 1238   CHOLHDL 2.6 12/06/2018 1238   VLDL 5 12/06/2018 1238   LDLCALC 66 12/06/2018 1238    Other Studies Reviewed Today:  Echocardiogram 12/06/2018: 1. Left ventricular ejection fraction, by visual estimation, is 60 to  65%. The left ventricle has normal function. There is mildly increased  left ventricular hypertrophy.  2. The left ventricle has no regional wall motion abnormalities.  3. Definity contrast agent was given IV to delineate the left ventricular  endocardial borders.  4. Left ventricular diastolic parameters are consistent with Grade I  diastolic dysfunction (impaired relaxation).  5. Global right ventricle has normal systolic function.The right  ventricular size is normal. No increase in right ventricular wall  thickness.  6. Left atrial size was normal.  7. Right atrial size was normal.  8. Mild to moderate mitral annular calcification.  9. The mitral valve is normal in structure. No evidence of mitral valve  regurgitation. No evidence of mitral stenosis.  10. The tricuspid valve is normal in structure. Tricuspid valve  regurgitation is trivial.  11. The aortic valve is tricuspid. Aortic valve regurgitation is not  visualized. Severe  aortic valve stenosis. Mean gradient 53 mmHg with AVA  0.57 cm^2.  12. The inferior vena cava is normal in size with greater than 50%  respiratory variability, suggesting right atrial pressure of 3 mmHg.  13. The tricuspid regurgitant velocity is 2.47 m/s, and with an assumed  right atrial pressure of 3 mmHg, the estimated right ventricular systolic  pressure is normal at 27.4 mmHg.   Cardiac catheterization 02/28/2019:  Mid LAD lesion is 20% stenosed.  1st Sept lesion is 80% stenosed.  2nd Mrg lesion is 20% stenosed.  Hemodynamic findings consistent with aortic valve stenosis.   1. Mild non-obstructive CAD 2. Severe aortic valve stenosis (mean gradient 60.2 mmHg, peak to peak gradient 75 mmHg, AVA 0.55 cm2)  Recommendations: Will continue medical management of mild CAD. Continue with workup for TAVR.   Assessment and Plan:  1.  Severe calcific aortic stenosis by recent echocardiogram and subsequent cardiac catheterization.  He continues work-up for TAVR and anticipates procedure as early as next week.  2.  Remote history of atrial fibrillation  without obvious recurrences.  CHA2DS2-VASc or is 4.  Recent cardiac monitor reviewed without evidence of atrial fibrillation.  Continue observation for now.  He is on antiplatelet regimen at this time (please see previous note for discussion).  3.  Mild coronary atherosclerosis by cardiac catheterization.  Continue aspirin and statin.  Medication Adjustments/Labs and Tests Ordered: Current medicines are reviewed at length with the patient today.  Concerns regarding medicines are outlined above.   Tests Ordered: No orders of the defined types were placed in this encounter.   Medication Changes: No orders of the defined types were placed in this encounter.   Disposition:  Follow up 3 months in the [place] office.  Signed, Satira Sark, MD, [facility] 03/07/2019 9:45 AM    [facility] [place].  [address] [place], [place] [address] Phone: [phone]; Fax: [phone]

## 2019-03-07 NOTE — Telephone Encounter (Signed)
John, I think he will be ok for GA.  Gerald Stabs

## 2019-03-08 ENCOUNTER — Ambulatory Visit: Payer: Medicare Other | Attending: Cardiovascular Disease | Admitting: Physical Therapy

## 2019-03-08 ENCOUNTER — Other Ambulatory Visit: Payer: Self-pay | Admitting: Urology

## 2019-03-08 ENCOUNTER — Encounter: Payer: Self-pay | Admitting: Physical Therapy

## 2019-03-08 ENCOUNTER — Encounter: Payer: Self-pay | Admitting: Surgery

## 2019-03-08 ENCOUNTER — Ambulatory Visit: Payer: Medicare Other | Admitting: Physical Therapy

## 2019-03-08 ENCOUNTER — Telehealth: Payer: Self-pay

## 2019-03-08 ENCOUNTER — Telehealth: Payer: Self-pay | Admitting: Cardiovascular Disease

## 2019-03-08 ENCOUNTER — Other Ambulatory Visit: Payer: Self-pay

## 2019-03-08 ENCOUNTER — Institutional Professional Consult (permissible substitution): Payer: Medicare Other | Admitting: Surgery

## 2019-03-08 VITALS — BP 139/82 | HR 80 | Temp 97.9°F | Resp 20 | Ht 70.0 in | Wt 219.0 lb

## 2019-03-08 DIAGNOSIS — R293 Abnormal posture: Secondary | ICD-10-CM | POA: Diagnosis not present

## 2019-03-08 DIAGNOSIS — N200 Calculus of kidney: Secondary | ICD-10-CM

## 2019-03-08 DIAGNOSIS — I350 Nonrheumatic aortic (valve) stenosis: Secondary | ICD-10-CM | POA: Diagnosis not present

## 2019-03-08 NOTE — Telephone Encounter (Signed)
I think this is a situation where it would be better to take care of the urological issues prior to pursuing TAVR.  Severe aortic stenosis should not preclude his ability to undergo general anesthesia, actually the literature would suggest the main risks are related to bleeding.  He should be able to hold aspirin.  He has an adequate functional capacity to suspect he should be able to tolerate general anesthesia.  If he were to undergo TAVR first, then he would be on Plavix and not be able to hold this in the short-term for urological intervention.  I am forwarding this to Dr. Angelena Form and Dr. Cyndia Bent who are also involved in his care, in case there is a difference in opinion.

## 2019-03-08 NOTE — Telephone Encounter (Signed)
KUB order placed and pt notified to get xray 1 hour before ov with Dr. Jeffie Pollock. Pt voiced understanding.

## 2019-03-08 NOTE — Telephone Encounter (Signed)
-----   Message from Irine Seal, MD sent at 03/08/2019  9:16 AM EST ----- I would like to get a KUB on him prior to the visit if possible.  ----- Message ----- From: Dorisann Frames, RN Sent: 03/08/2019   8:38 AM EST To: Irine Seal, MD  Yes, I actually had him scheduled already to come see you this Friday at 11:30 ----- Message ----- From: Irine Seal, MD Sent: 03/08/2019   8:17 AM EST To: Dorisann Frames, RN  This fellow has a stone that needs ureteroscopic treatment.   Can we squeeze him in on Friday.    I need to see him to discuss the procedure and work on getting him scheduled.  I am going to give a [place] to [facility] but need to see him first.

## 2019-03-08 NOTE — Progress Notes (Signed)
Patient ID: Andrew Watson, male   DOB: 09-Dec-1940, 79 y.o.   MRN: [id number]  [facility] CONSULTATION REPORT  Referring Provider is Satira Sark, MD Primary Cardiologist is No primary care provider on file. PCP is Antony Contras, MD  Chief Complaint  Patient presents with  . Aortic Stenosis    TAVR consult    HPI:  The patient is a 79 year old gentleman with a history of mild coronary artery disease, remote atrial fibrillation, hypothyroidism, pulmonary embolism, TIA in November 2020, COVID-19 in November 2020 treated with bamlanivimab, and aortic stenosis.  He had an echo in April 2013 which showed a mildly calcified aortic valve with a mean gradient of 15 mmHg.  He had another echocardiogram in March 2019 at [facility] when he underwent preoperative cardiology evaluation before hip replacement.  This reportedly showed moderate aortic stenosis and he underwent hip replacement without difficulty.  He had a repeat echocardiogram on 12/06/2018 which showed progression to severe aortic stenosis with a mean gradient of 53 mmHg and peak gradient of 77 mmHg.  Aortic valve area was 0.57 cm with a dimensionless index of 0.18.  The aortic valve was severely thickened and calcified with restricted mobility of the leaflets.   He is widowed and here today with his significant other.  He reports progressive exertional dyspnea and fatigue which is worsened over the past couple months.  He has had no chest pain or pressure.  He denies dizziness and syncope.  He said no orthopnea.  He does report some mild swelling in his ankles.  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  Past Medical History:  Diagnosis Date  . Acute pancreatitis 2017   [facility]  . Aortic valve stenosis   . Arthritis   . CAD (coronary artery disease) 05/07/2011   Mild atherosclerosis by cardiac catheterization February 2021  . GERD (gastroesophageal reflux disease)   . History of anemia   . History of atrial fibrillation 2013   Reportedly limited episode  . History of kidney stones   . History of MRSA infection   . History of pulmonary embolism 2017  . Hypothyroidism   . Pancreatitis 05/2015  . TIA (transient ischemic attack)    November 2020    Past Surgical History:  Procedure Laterality Date  . BACK SURGERY    . CHOLECYSTECTOMY    . COLONOSCOPY  07/08/2011   Procedure: COLONOSCOPY;  Surgeon: Rogene Houston, MD;  Location: [facility] ENDO SUITE;  Service: Endoscopy;  Laterality: N/A;  930  . DUODENOTOMY  09/29/2016   repair of fistula, jejunal resection  . INGUINAL HERNIA REPAIR N/A 03/01/2018   Procedure: LAPAROSCOPIC TEP BILATERAL INGUINAL HERNIA REPAIR;  Surgeon: Johnathan Hausen, MD;  Location: [facility] ORS;  Service: General;  Laterality: N/A;  . JEJUNOSTOMY FEEDING TUBE    . KNEE ARTHROSCOPY Left   . LEFT HEART CATHETERIZATION WITH CORONARY ANGIOGRAM N/A 05/07/2011   Procedure: LEFT HEART CATHETERIZATION WITH CORONARY ANGIOGRAM;  Surgeon: Peter M Martinique, MD;  Location: [facility] CATH LAB;  Service: Cardiovascular;  Laterality: N/A;  . PEG TUBE PLACEMENT    . RIGHT/LEFT HEART CATH AND CORONARY ANGIOGRAPHY N/A 02/28/2019   Procedure: RIGHT/LEFT HEART CATH AND CORONARY ANGIOGRAPHY;  Surgeon: Burnell Blanks, MD;  Location: [employer] [facility] CV LAB;  Service: Cardiovascular;  Laterality: N/A;  . VASECTOMY  Family History  Problem Relation Age of Onset  . Arthritis Mother   . Other Brother        staph infection    Social History   Socioeconomic History  . Marital status: Widowed    Spouse name: [name]  . Number of children: Not on file  . Years of education: Not on file  . Highest education level: High school graduate  Occupational History    Comment: retired   Tobacco Use  . Smoking status: Never Smoker  . Smokeless tobacco: Never Used    Substance and Sexual Activity  . Alcohol use: No    Comment: none since Jun 26, 2015  . Drug use: No  . Sexual activity: Not on file  Other Topics Concern  . Not on file  Social History Narrative   Lives with wife   Caffeine- coffee 2 c daily, usually decaf   Social Determinants of Health   Financial Resource Strain:   . Difficulty of Paying Living Expenses: Not on file  Food Insecurity:   . Worried About Charity fundraiser in the Last Year: Not on file  . Ran Out of Food in the Last Year: Not on file  Transportation Needs:   . Lack of Transportation (Medical): Not on file  . Lack of Transportation (Non-Medical): Not on file  Physical Activity:   . Days of Exercise per Week: Not on file  . Minutes of Exercise per Session: Not on file  Stress:   . Feeling of Stress : Not on file  Social Connections:   . Frequency of Communication with Friends and Family: Not on file  . Frequency of Social Gatherings with Friends and Family: Not on file  . Attends Religious Services: Not on file  . Active Member of Clubs or Organizations: Not on file  . Attends Archivist Meetings: Not on file  . Marital Status: Not on file  Intimate Partner Violence:   . Fear of Current or Ex-Partner: Not on file  . Emotionally Abused: Not on file  . Physically Abused: Not on file  . Sexually Abused: Not on file    Current Outpatient Medications  Medication Sig Dispense Refill  . amoxicillin (AMOXIL) 500 MG capsule Take 2,000 mg by mouth See admin instructions. Take 4 capsules (2000 mg) 1 hour prior dental procedures.    [name] Kitchen aspirin 81 MG chewable tablet Chew 1 tablet (81 mg total) by mouth daily. 30 tablet 0  . atorvastatin (LIPITOR) 10 MG tablet Take 1 tablet (10 mg total) by mouth daily. 90 tablet 2  . levothyroxine (SYNTHROID, LEVOTHROID) 112 MCG tablet Take 224 mcg by mouth daily before breakfast.     . metoprolol tartrate (LOPRESSOR) 50 MG tablet Take one tablet by mouth at 8:30 AM on  03/06/2019 1 tablet 0   No current facility-administered medications for this visit.    No Known Allergies    Review of Systems:   General:  normal appetite, + decreased energy, no weight gain, no weight loss, no fever  Cardiac:  no chest pain with exertion, no chest pain at rest, +SOB with mild exertion, no resting SOB, no PND, no orthopnea, no palpitations, no arrhythmia, + remote atrial fibrillation, + LE edema, no dizzy spells, no syncope  Respiratory:  + exertional shortness of breath, no home oxygen, no productive cough, no dry cough, no bronchitis, no wheezing, no hemoptysis, no asthma, no pain with inspiration or cough, no sleep apnea, no CPAP at night  GI:   no difficulty swallowing, no reflux, no frequent heartburn, no hiatal hernia, no abdominal pain, no constipation, no diarrhea, no hematochezia, no hematemesis, no melena  GU:   no dysuria,  + frequency, no urinary tract infection, no hematuria, + enlarged prostate, + recently diagonosed kidney stones, no kidney disease  Vascular:  no pain suggestive of claudication, no pain in feet, no leg cramps, no varicose veins, no DVT, no non-healing foot ulcer  Neuro:   no stroke, + TIA, no seizures, bi headaches, bi temporary blindness one eye,  bi slurred speech, bi peripheral neuropathy, bi chronic pain, bi instability of gait, bi memory/cognitive dysfunction  Musculoskeletal: bi arthritis, bi joint swelling, bi myalgias, bi difficulty walking, normal mobility   Skin:   no rash, no itching, no skin infections, no pressure sores or ulcerations  Psych:   no anxiety, no depression, no nervousness, no unusual recent stress  Eyes:   no blurry vision, no floaters, no recent vision changes, + wears glasses or contacts  ENT:   no hearing loss, no loose or painful teeth, no dentures, last saw dentist 12/2018  Hematologic:  no easy bruising, no abnormal bleeding, no clotting disorder, no frequent epistaxis  Endocrine:  no diabetes, does not check  CBG's at home        Physical Exam:   BP 139/82 (BP Location: Right Arm, Patient Position: Sitting, Cuff Size: Normal)   Pulse 80   Temp 97.9 F (36.6 C) (Temporal)   Resp 20   Ht 5\' 10"  (1.778 m)   Wt 219 lb (99.3 kg)   SpO2 98% Comment: RA  BMI 31.42 kg/m   General:  Elderly but  well-appearing  HEENT:  Unremarkable, NCAT, PERLA, EOMI  Neck:   no JVD, no bruits, no adenopathy   Chest:   clear to auscultation, symmetrical breath sounds, no wheezes, no rhonchi   CV:   RRR, grade lll/VI crescendo/decrescendo murmur heard best at RSB,  no diastolic murmur  Abdomen:  soft, non-tender, no masses   Extremities:  warm, well-perfused, pulses palpable at ankle, trace LE edema  Rectal/GU  Deferred  Neuro:   Grossly non-focal and symmetrical throughout  Skin:   Clean and dry, no rashes, no breakdown   Diagnostic Tests:  ECHOCARDIOGRAM REPORT       Patient Name:  Andrew Watson Date of Exam: 12/06/2018  Medical Rec #: [id number]     Height:    70.0 in  Accession #:  [id number]    Weight:    221.0 lb  Date of Birth: 02/16/1940     BSA:     2.18 m  Patient Age:  69 years     BP:      125/71 mmHg  Patient Gender: M         HR:      89 bpm.  Exam Location: Inpatient   Procedure: 2D Echo, Intracardiac Opacification Agent and Saline Contrast  Bubble       Study   Indications:  TIA (transient ischemic attack) 435.9 / G45.9    History:    Patient has prior history of Echocardiogram examinations,  most         recent 04/02/2017. Amaurosis fugax.    Sonographer:  Darlina Sicilian RDCS  Referring Phys: [phone] ANKIT NANAVATI     Sonographer Comments: Last echo perfored at [facility] notes mean gradient of  20-59mmGH, visually severe AS. Patient claims he doesn't expirence any  dyspnea with exertion, but others tell  him he is becoming shot of breath.  IMPRESSIONS    1. Left ventricular ejection fraction,  by visual estimation, is 60 to  65%. The left ventricle has normal function. There is mildly increased  left ventricular hypertrophy.  2. The left ventricle has no regional wall motion abnormalities.  3. Definity contrast agent was given IV to delineate the left ventricular  endocardial borders.  4. Left ventricular diastolic parameters are consistent with Grade I  diastolic dysfunction (impaired relaxation).  5. Global right ventricle has normal systolic function.The right  ventricular size is normal. No increase in right ventricular wall  thickness.  6. Left atrial size was normal.  7. Right atrial size was normal.  8. Mild to moderate mitral annular calcification.  9. The mitral valve is normal in structure. No evidence of mitral valve  regurgitation. No evidence of mitral stenosis.  10. The tricuspid valve is normal in structure. Tricuspid valve  regurgitation is trivial.  11. The aortic valve is tricuspid. Aortic valve regurgitation is not  visualized. Severe aortic valve stenosis. Mean gradient 53 mmHg with AVA  0.57 cm^2.  12. The inferior vena cava is normal in size with greater than 50%  respiratory variability, suggesting right atrial pressure of 3 mmHg.  13. The tricuspid regurgitant velocity is 2.47 m/s, and with an assumed  right atrial pressure of 3 mmHg, the estimated right ventricular systolic  pressure is normal at 27.4 mmHg.   FINDINGS  Left Ventricle: Left ventricular ejection fraction, by visual estimation,  is 60 to 65%. The left ventricle has normal function. Definity contrast  agent was given IV to delineate the left ventricular endocardial borders.  The left ventricle has no  regional wall motion abnormalities. There is mildly increased left  ventricular hypertrophy. Left ventricular diastolic parameters are  consistent with Grade I diastolic dysfunction (impaired relaxation).   Right Ventricle: The right ventricular size is normal. No increase in    right ventricular wall thickness. Global RV systolic function is has  normal systolic function. The tricuspid regurgitant velocity is 2.47 m/s,  and with an assumed right atrial pressure  of 3 mmHg, the estimated right ventricular systolic pressure is normal at  27.4 mmHg.   Left Atrium: Left atrial size was normal in size.   Right Atrium: Right atrial size was normal in size   Pericardium: There is no evidence of pericardial effusion.   Mitral Valve: The mitral valve is normal in structure. There is mild  calcification of the mitral valve leaflet(s). Mild to moderate mitral  annular calcification. No evidence of mitral valve stenosis by  observation. No evidence of mitral valve  regurgitation.   Tricuspid Valve: The tricuspid valve is normal in structure. Tricuspid  valve regurgitation is trivial.   Aortic Valve: The aortic valve is tricuspid. Aortic valve regurgitation is  not visualized. Severe aortic stenosis is present. Aortic valve mean  gradient measures 50.6 mmHg. Aortic valve peak gradient measures 77.3  mmHg. Aortic valve area, by VTI measures  0.57 cm.   Pulmonic Valve: The pulmonic valve was normal in structure. Pulmonic valve  regurgitation is not visualized.   Aorta: The aortic root is normal in size and structure.   Venous: The inferior vena cava is normal in size with greater than 50%  respiratory variability, suggesting right atrial pressure of 3 mmHg.   IAS/Shunts: No atrial level shunt detected by color flow Doppler. Agitated  saline contrast was given intravenously to evaluate for intracardiac  shunting.  LEFT VENTRICLE  PLAX 2D  LVIDd:     4.00 cm Diastology  LVIDs:     2.80 cm LV e' lateral:  7.72 cm/s  LV PW:     1.10 cm LV E/e' lateral: 11.6  LV IVS:    1.20 cm LV e' medial:  8.38 cm/s  LVOT diam:   2.00 cm LV E/e' medial: 10.7  LV SV:     40 ml  LV SV Index:  17.94  LVOT Area:   3.14 cm      LEFT ATRIUM      Index  LA diam:   4.20 cm 1.93 cm/m  LA Vol (A4C): 53.5 ml 24.57 ml/m  AORTIC VALVE  AV Area (Vmax):  0.65 cm  AV Area (Vmean):  0.57 cm  AV Area (VTI):   0.57 cm  AV Vmax:      439.60 cm/s  AV Vmean:     342.000 cm/s  AV VTI:      0.973 m  AV Peak Grad:   77.3 mmHg  AV Mean Grad:   50.6 mmHg  LVOT Vmax:     90.80 cm/s  LVOT Vmean:    62.200 cm/s  LVOT VTI:     0.177 m  LVOT/AV VTI ratio: 0.18    AORTA  Ao Root diam: 3.00 cm   MITRAL VALVE             TRICUSPID VALVE  MV Area (PHT): 3.65 cm       TR Peak grad:  24.4 mmHg  MV PHT:    60.32 msec      TR Vmax:    247.00 cm/s  MV Decel Time: 208 msec  MV E velocity: 89.40 cm/s 103 cm/s SHUNTS  MV A velocity: 130.00 cm/s 70.3 cm/s Systemic VTI: 0.18 m  MV E/A ratio: 0.69    1.5    Systemic Diam: 2.00 cm     Loralie Champagne MD  Electronically signed by Loralie Champagne MD  Signature Date/Time: 12/06/2018/12:17:00 PM       Physicians Panel Physicians Referring Physician Case Authorizing Physician  Burnell Blanks, MD (Primary)       Procedures RIGHT/LEFT HEART CATH AND CORONARY ANGIOGRAPHY     Conclusion Mid LAD lesion is 20% stenosed.  1st Sept lesion is 80% stenosed.  2nd Mrg lesion is 20% stenosed.  Hemodynamic findings consistent with aortic valve stenosis. 1. Mild non-obstructive CAD  2. Severe aortic valve stenosis (mean gradient 60.2 mmHg, peak to peak gradient 75 mmHg, AVA 0.55 cm2)  Recommendations: Will continue medical management of mild CAD. Continue with workup for TAVR.      Recommendations Antiplatelet/Anticoag Medical management of mild CAD. Proceed with workup for TAVR.        Indications Severe aortic stenosis [I35.0 (ICD-10-CM)]     Procedural Details Technical Details Indication: Severe aortic stenosis  Procedure: The risks, benefits, complications, treatment  options, and expected outcomes were discussed with the patient. The patient and/or family concurred with the proposed plan, giving informed consent. The patient was brought to the cath lab after IV hydration was given. The patient was sedated with Versed and Fentanyl. The IV catheter in the right antecubital vein was changed for a 5 [place] sheath. Right heart catheterization performed with a balloon tipped catheter. The right wrist was prepped and draped in a sterile fashion. 1% lidocaine was used for local anesthesia. Using the modified Seldinger access technique, a 5 French sheath was placed in the right radial  artery. 3 mg Verapamil was given through the sheath. 5000 units IV heparin was given. Standard diagnostic catheters were used to perform selective coronary angiography. I crossed the aortic valve with an AL-1 catheter and a J wire. The sheath was removed from the right radial artery and a Terumo hemostasis band was applied at the arteriotomy site on the right wrist.    Estimated blood loss <50 mL.   During this procedure medications were administered to achieve and maintain moderate conscious sedation while the patient's heart rate, blood pressure, and oxygen saturation were continuously monitored and I was present face-to-face 100% of this time.     Medications (Filter: Administrations occurring from 02/28/19 1230 to 02/28/19 1334)  Continuous medications are totaled by the amount administered until 02/28/19 1334.  Heparin (Porcine) in NaCl 1000-0.9 UT/500ML-% SOLN (mL) Total volume: 500 mL  Date/Time   Rate/Dose/Volume Action  02/28/19 1236  500 mL Given  Heparin (Porcine) in NaCl 1000-0.9 UT/500ML-% SOLN (mL) Total volume: 500 mL  Date/Time   Rate/Dose/Volume Action  02/28/19 1236  500 mL Given  fentaNYL (SUBLIMAZE) injection (mcg) Total dose: 25 mcg  Date/Time   Rate/Dose/Volume Action  02/28/19 1240  25 mcg Given  midazolam (VERSED) injection (mg) Total dose: 1 mg  Date/Time    Rate/Dose/Volume Action  02/28/19 1240  1 mg Given  lidocaine (PF) (XYLOCAINE) 1 % injection (mL) Total volume: 6 mL  Date/Time   Rate/Dose/Volume Action  02/28/19 1248  3 mL Given  1248  3 mL Given  Radial Cocktail/Verapamil only (mL) Total volume: 10 mL  Date/Time   Rate/Dose/Volume Action  02/28/19 1250  10 mL Given  heparin injection (Units) Total dose: 5,000 Units  Date/Time   Rate/Dose/Volume Action  02/28/19 1301  5,000 Units Given  iohexol (OMNIPAQUE) 350 MG/ML injection (mL) Total volume: 60 mL  Date/Time   Rate/Dose/Volume Action  02/28/19 1324  60 mL Given     Sedation Time Sedation Time Physician-1: 41 minutes 49 seconds        Contrast Medication Name Total Dose  iohexol (OMNIPAQUE) 350 MG/ML injection 60 mL     Radiation/Fluoro Fluoro time: 6.8 (min)  DAP: 14881 (mGycm2)  Cumulative Air Kerma: 123XX123 (mGy)     Complications Complications documented before study signed (02/28/2019 1:34 PM)  RIGHT/LEFT HEART CATH AND CORONARY ANGIOGRAPHY  None Documented by Burnell Blanks, MD 02/28/2019 1:27 PM  Date Found: 02/28/2019  Time Range: Intraprocedure       Coronary Findings Diagnostic Dominance: Left  Left Anterior Descending  Mid LAD lesion 20% stenosed  Mid LAD lesion is 20% stenosed.   First Diagonal Branch  Vessel is small in size.   First Septal Branch  Vessel is small in size.  1st Sept lesion 80% stenosed  1st Sept lesion is 80% stenosed.   Second Diagonal Branch  Vessel is moderate in size.   Left Circumflex  Vessel is large.   Second Obtuse Marginal Branch  Vessel is moderate in size.  2nd Mrg lesion 20% stenosed  2nd Mrg lesion is 20% stenosed.   Right Coronary Artery  Vessel is small.  Intervention No interventions have been documented.          Right Heart Right Heart Pressures Hemodynamic findings consistent with aortic valve stenosis.                    Coronary  Diagrams Diagnostic Dominance: Left  &&&&&  Intervention      Implants  No  implant documentation for this case.      Syngo Images Link to Procedure Log  Show images for CARDIAC CATHETERIZATION Procedure Log     Images on Long Term Storage   Show images for Tushar, Dennee         [facility] Data   Most Recent Value  Fick Cardiac Output 5.11 L/min  Fick Cardiac Output Index 2.35 (L/min)/BSA  Aortic Mean Gradient 60.16 mmHg  Aortic Peak Gradient 75 mmHg  Aortic Valve Area 0.55  Aortic Value Area Index 0.25 cm2/BSA  RA A Wave 6 mmHg  RA V Wave 6 mmHg  RA Mean 5 mmHg  RV Systolic Pressure 32 mmHg  RV Diastolic Pressure 0 mmHg  RV EDP 6 mmHg  PA Systolic Pressure 30 mmHg  PA Diastolic Pressure 11 mmHg  PA Mean 20 mmHg  PW A Wave 20 mmHg  PW V Wave 22 mmHg  PW Mean 18 mmHg  AO Systolic Pressure 0000000 mmHg  AO Diastolic Pressure 69 mmHg  AO Mean 92 mmHg  LV Systolic Pressure 99991111 mmHg  LV Diastolic Pressure 10 mmHg  LV EDP 17 mmHg  AOp Systolic Pressure [id number] mmHg  AOp Diastolic Pressure 57 mmHg  AOp Mean Pressure 81 mmHg  LVp Systolic Pressure 99991111 mmHg  LVp Diastolic Pressure 13 mmHg  LVp EDP Pressure 19 mmHg  QP/QS 1  TPVR Index 8.51 HRUI  TSVR Index 39.16 HRUI  PVR SVR Ratio 0.02  TPVR/TSVR Ratio 0.22   ADDENDUM REPORT: 03/07/2019 05:24  CLINICAL DATA:  Aortic stenosis  EXAM: Cardiac TAVR CT  TECHNIQUE: The patient was scanned on a Siemens Force [id number] slice scanner. A 120 kV retrospective scan was triggered in the descending thoracic aorta at 111 HU's. Gantry rotation speed was 270 msecs and collimation was .9 mm. No beta blockade or nitro were given. The 3D data set was reconstructed in 5% intervals of the R-R cycle. Systolic and diastolic phases were analyzed on a dedicated work station using MPR, MIP and VRT modes. The patient received 154mL OMNIPAQUE IOHEXOL 350 MG/ML SOLN of contrast.  FINDINGS: Aortic Valve: Tricuspid aortic valve. Severely  reduced cusp separation. Moderately thickened, severely calcified aortic valve cusps.  AV calcium score: 3494  Virtual Basal Annulus Measurements:  Maximum/Minimum Diameter: 29.9 x 23.4 mm  Perimeter: 83.2 mm  Area: 528 mm2  Calcification protruding into the annulus. Calcifications present along aorto-mitral continuity.  Based on these measurements, the annulus would be suitable for a 26 mm Sapien 3 valve.  Sinus of Valsalva Measurements:  Non-coronary: 30 mm  Right - coronary: 31 mm  Left - coronary: 33 mm  Sinus of Valsalva Height:  Left: 13.4 mm  Right:15.1 mm  Aorta: Trivial calcifications in the aortic arch. Short segment common origin of the brachiocephalic and left common carotid arteries.  Sinotubular Junction: 30 mm  Ascending Thoracic Aorta: 34 mm  Aortic Arch: 28 mm  Descending Thoracic Aorta: 26 mm  Coronary Artery Height above Annulus:  Left Main: 10.1 mm to LAD ostium, 12.2 mm to L circumflex ostium.  Right Coronary: 11.5 mm  Coronary Arteries: 3 vessel coronary artery calcifications. Separate ostia of the left circumflex and LAD.  Optimum Fluoroscopic Angle for Delivery: LAO 14, CAU 15  No left atrial appendage thrombus.  Normal pulmonary venous flow.  IMPRESSION: 1. Aortic Valve: Tricuspid aortic valve. Severely reduced cusp separation. Moderately thickened, severely calcified aortic valve cusps.  2.  AV calcium score: 3494  3. Annulus area 528 mm2. Calcification in  the LVOT along aorto-mitral continuity. Annulus would be suitable for a 26 mm Sapien 3 valve.  4. Coronary heights: LAD 10.1 mm, L circumflex 12.2 mm, RCA 11.5 mm.  5. Normal caliber thoracic aorta with trivial calcifications in the aortic arch.  6. Optimum Fluoroscopic Angle for Delivery: LAO 6, CAU 15   Electronically Signed   By: Cherlynn Kaiser   On: 03/07/2019 05:24   CLINICAL DATA:  Severe symptomatic aortic  stenosis. Pre-TAVR evaluation.  EXAM: CT ANGIOGRAPHY CHEST, ABDOMEN AND PELVIS  TECHNIQUE: Multidetector CT imaging through the chest, abdomen and pelvis was performed using the standard protocol during bolus administration of intravenous contrast. Multiplanar reconstructed images and MIPs were obtained and reviewed to evaluate the vascular anatomy.  CONTRAST:  139mL OMNIPAQUE IOHEXOL 350 MG/ML SOLN  COMPARISON:  12/31/2017 CT abdomen/pelvis. No prior chest CT. 11/24/2015 chest radiograph.  FINDINGS: CTA CHEST FINDINGS  Cardiovascular: Mild cardiomegaly. No significant pericardial effusion/thickening. Diffuse thickening and coarse calcification of the aortic valve. Left anterior descending coronary atherosclerosis. Atherosclerotic nonaneurysmal thoracic aorta. Patent aortic arch branch vessels. Normal caliber pulmonary arteries. No central pulmonary emboli.  Mediastinum/Nodes: Hypodense 1.5 cm anterior right thyroid nodule. Unremarkable esophagus. No pathologically enlarged axillary, mediastinal or hilar lymph nodes.  Lungs/Pleura: No pneumothorax. Small basilar right pleural effusion with smooth right pleural thickening. No left pleural effusion. No central airway stenoses. Masslike consolidation in the posterior right lower lobe measures 5.9 x 2.0 cm (series 16/image 67), abutting the site of pleural thickening and pleural effusion, with associated volume loss and surrounding parenchymal banding, most compatible with rounded atelectasis. Otherwise no acute consolidative airspace disease, lung masses or significant pulmonary nodules.  Musculoskeletal: No aggressive appearing focal osseous lesions. Mild thoracic spondylosis.  CTA ABDOMEN AND PELVIS FINDINGS  Hepatobiliary: Normal liver with no liver mass. Cholecystectomy. Bile ducts are stable and within normal post cholecystectomy limits. CBD diameter 7 mm.  Pancreas: Normal, with no mass or duct  dilation.  Spleen: Normal size. No mass.  Adrenals/Urinary Tract: Normal adrenals. There is a 7 mm left ureteropelvic junction stone with mild-to-moderate left hydronephrosis. No right hydronephrosis. Hypodense 1.5 cm anterior interpolar left renal cortical lesion is stable in size since 12/31/2017 CT, presumably benign. No additional contour deforming renal lesions. Mild diffuse bladder wall thickening is not appreciably changed. Bladder obscured by streak artifact from left hip hardware.  Stomach/Bowel: Normal non-distended stomach. Normal caliber small bowel with no small bowel wall thickening. Normal appendix. Minimal sigmoid diverticulosis. No large bowel wall thickening or significant pericolonic fat stranding.  Vascular/Lymphatic: Atherosclerotic nonaneurysmal abdominal aorta. Patent splenic, portal and renal veins. No pathologically enlarged lymph nodes in the abdomen or pelvis.  Reproductive: Mildly enlarged prostate. Nonspecific internal prostatic calcifications.  Other: No pneumoperitoneum, ascites or focal fluid collection. Chronic mild haziness of the central mesenteric fat is unchanged.  Musculoskeletal: No aggressive appearing focal osseous lesions. Left total hip arthroplasty. Moderate lumbar spondylosis.  VASCULAR MEASUREMENTS PERTINENT TO TAVR:  AORTA:  Minimal Aortic Diameter-16.7 x 15.3 mm  Severity of Aortic Calcification-mild  RIGHT PELVIS:  Right Common Iliac Artery -  Minimal Diameter-11.0 x 10.8 mm  Tortuosity-mild  Calcification-mild  Right External Iliac Artery -  Minimal Diameter-7.7 x 7.2 mm  Tortuosity-mild  Calcification-none  Right Common Femoral Artery -  Minimal Diameter-7.6 x 7.5 mm  Tortuosity-mild  Calcification-none  LEFT PELVIS:  Left Common Iliac Artery -  Minimal Diameter-10.9 x 8.3 mm  Tortuosity-mild  Calcification-moderate  Left External Iliac Artery -  Minimal  Diameter-7.9 x 7.3  mm  Tortuosity-mild  Calcification-none  Left Common Femoral Artery -  Minimal Diameter-a 0.8 x 8.7 mm  Tortuosity-mild  Calcification-none  Review of the MIP images confirms the above findings.  IMPRESSION: 1. Vascular findings and measurements pertinent to potential TAVR procedure, as detailed. 2. Diffuse thickening and coarse calcification of the aortic valve, compatible with the reported history of severe symptomatic aortic stenosis. 3. Obstructing 7 mm left UPJ stone with mild-to-moderate left hydronephrosis. 4. Small basilar right pleural effusion with smooth right pleural thickening, suggesting chronic loculated right pleural effusion. 5. Findings at the right lung base most compatible with rounded atelectasis. Follow-up chest CT advised in 3 months to document stability of this finding. 6. Mild cardiomegaly. Coronary atherosclerosis. 7. Nonspecific mild diffuse bladder wall thickening, probably due to chronic bladder outlet obstruction by the mildly enlarged prostate. 8. Right thyroid 1.5 cm nodule. Thyroid ultrasound correlation indicated if not previously performed.(Ref: J Am Coll Radiol. 2015 Feb;12(2): 143-50). 9. Aortic Atherosclerosis (ICD10-I70.0).  These results will be called to the ordering clinician or representative by the Radiologist Assistant, and communication documented in the PACS or zVision Dashboard.   Electronically Signed   By: Ilona Sorrel M.D.   On: 03/06/2019 13:04   STS Risk Score: Risk of Mortality: 1.784% Renal Failure: 3.266% Permanent Stroke: 3.369% Prolonged Ventilation: 7.939% DSW Infection: 0.143% Reoperation: 5.948% Morbidity or Mortality: 14.356% Short Length of Stay: 23.696% Long Length of Stay: 7.269%   Impression:  This patient is a 79 year old gentleman with stage D, severe, symptomatic aortic stenosis with New York Heart Association class III symptoms of exertional  fatigue and shortness of breath consistent with chronic diastolic congestive heart failure.  I have personally reviewed his 2D echocardiogram, cardiac catheterization, and CTA studies.  His echocardiogram shows a severely calcified and thickened aortic valve with restricted mobility of the leaflets.  The mean gradient across the aortic valve is 50.6 mmHg with a calculated aortic valve area of 0.57 cm.  Left ventricular ejection fraction is 60 to 65%.  Cardiac catheterization shows mild nonobstructive coronary disease.  The mean gradient across the aortic valve is measured at 60 mmHg consistent with severe aortic stenosis.  I agree that aortic valve replacement is indicated in this patient for improvement of symptoms and prevent progressive left ventricular deterioration.  Given his advanced age I think transcatheter aortic valve replacement would be the best treatment for him.  His gated cardiac CTA shows anatomy suitable for TAVR using a SAPIEN 3 valve.  His abdominal and pelvic CTA shows adequate pelvic vascular anatomy to allow transfemoral insertion.  His abdominal and pelvic CTA also shows a 7 to 10 mm obstructing left ureteral calculus with left kidney hydronephrosis.  This should be evaluated preoperatively by urology.  He is currently asymptomatic but I think an obstructing stone would increase his risk of urinary tract infection and urosepsis if not treated.  The patient and his significant other were counseled at length regarding treatment alternatives for management of severe symptomatic aortic stenosis. The risks and benefits of surgical intervention has been discussed in detail. Long-term prognosis with medical therapy was discussed. Alternative approaches such as conventional surgical aortic valve replacement, transcatheter aortic valve replacement, and palliative medical therapy were compared and contrasted at length. This discussion was placed in the context of the patient's own specific clinical  presentation and past medical history. All of their questions have been addressed.   Following the decision to proceed with transcatheter aortic valve replacement, a discussion was held regarding  what types of management strategies would be attempted intraoperatively in the event of life-threatening complications, including whether or not the patient would be considered a candidate for the use of cardiopulmonary bypass and/or conversion to open sternotomy for attempted surgical intervention.  He is 79 years old but a low risk surgical patient and I think he would be a candidate for emergent sternotomy if needed to manage any intraoperative complications.  The patient has been advised of a variety of complications that might develop including but not limited to risks of death, stroke, paravalvular leak, aortic dissection or other major vascular complications, aortic annulus rupture, device embolization, cardiac rupture or perforation, mitral regurgitation, acute myocardial infarction, arrhythmia, heart block or bradycardia requiring permanent pacemaker placement, congestive heart failure, respiratory failure, renal failure, pneumonia, infection, other late complications related to structural valve deterioration or migration, or other complications that might ultimately cause a temporary or permanent loss of functional independence or other long term morbidity. The patient provides full informed consent for the procedure as described and all questions were answered.     Plan:  He has an appointment tomorrow with Dr. Jeffie Pollock for urologic evaluation.  He will be scheduled for transfemoral transcatheter aortic valve replacement in the near future depending on his urologic evaluation and treatment.  I spent 60 minutes performing this consultation and > 50% of this time was spent face to face counseling and coordinating the care of this patient's severe symptomatic aortic stenosis.     Gaye Pollack,  MD 03/08/2019 10:27 AM

## 2019-03-08 NOTE — Telephone Encounter (Signed)
   [facility] Pre-operative Risk Assessment    Request for surgical clearance:  1. What type of surgery is being performed? Left ureteroscopy  2. When is this surgery scheduled? 2/16  3. What type of clearance is required (medical clearance vs. Pharmacy clearance to hold med vs. Both)? medical  4. Are there any medications that need to be held prior to surgery and how long? Asprin held 5 days prior  5. Practice name and name of physician performing surgery? [facility], Dr. Irine Seal  6. What is your office phone number: [phone]    7.   What is your office fax number: [phone]  8.   Anesthesia type (None, local, MAC, general) ? general   Selena Zobro 03/08/2019, 3:29 PM  _________________________________________________________________   (provider comments below)

## 2019-03-08 NOTE — Telephone Encounter (Signed)
Hi. Dr. [name],  Andrew Watson is scheduled for a left ureteroscopy on 03/14/2019. I saw that you saw him yesterday and he is currently being worked up for TAVR. He saw Dr. Cyndia Bent today. Per his note, pre-op abdominal and pelvic CTA showed obstructing left ureteral calculus with left kidney hydronephrosis which should be evaluated preoperatively by urology.   Urology is planning on using general anesthesia. Given his severe aortic stenosis, can you comment on his peri-operative risk and if it is OK if he hold Aspirin for 5 days?  Please route response back to P CV DIV PREOP.  Thank you!

## 2019-03-08 NOTE — Therapy (Signed)
[place], [place], [address] Phone: [phone]   Fax:  [phone]  Physical Therapy Evaluation  Patient Details  Name: Andrew Watson MRN: [id number] Date of Birth: 1940/09/19 Referring Provider (PT): Burnell Blanks, MD   Encounter Date: 03/08/2019  PT End of Session - 03/08/19 1106    Visit Number  1    Number of Visits  1    Date for PT Re-Evaluation  03/08/19    PT Start Time  1050    PT Stop Time  1120    PT Time Calculation (min)  30 min    Activity Tolerance  Patient tolerated treatment well    Behavior During Therapy  [facility] for tasks assessed/performed       Past Medical History:  Diagnosis Date  . Acute pancreatitis 2017   [facility]  . Aortic valve stenosis   . Arthritis   . CAD (coronary artery disease) 05/07/2011   Mild atherosclerosis by cardiac catheterization February 2021  . GERD (gastroesophageal reflux disease)   . History of anemia   . History of atrial fibrillation 2013   Reportedly limited episode  . History of kidney stones   . History of MRSA infection   . History of pulmonary embolism 2017  . Hypothyroidism   . Pancreatitis 05/2015  . TIA (transient ischemic attack)    November 2020    Past Surgical History:  Procedure Laterality Date  . BACK SURGERY    . CHOLECYSTECTOMY    . COLONOSCOPY  07/08/2011   Procedure: COLONOSCOPY;  Surgeon: Rogene Houston, MD;  Location: [facility] SUITE;  Service: Endoscopy;  Laterality: N/A;  930  . DUODENOTOMY  09/29/2016   repair of fistula, jejunal resection  . INGUINAL HERNIA REPAIR N/A 03/01/2018   Procedure: LAPAROSCOPIC TEP BILATERAL INGUINAL HERNIA REPAIR;  Surgeon: Johnathan Hausen, MD;  Location: [facility] ORS;  Service: General;  Laterality: N/A;  . JEJUNOSTOMY FEEDING TUBE    . KNEE ARTHROSCOPY Left   . LEFT HEART CATHETERIZATION WITH CORONARY ANGIOGRAM N/A 05/07/2011   Procedure: LEFT HEART CATHETERIZATION WITH  CORONARY ANGIOGRAM;  Surgeon: Peter M Martinique, MD;  Location: [facility] CATH LAB;  Service: Cardiovascular;  Laterality: N/A;  . PEG TUBE PLACEMENT    . RIGHT/LEFT HEART CATH AND CORONARY ANGIOGRAPHY N/A 02/28/2019   Procedure: RIGHT/LEFT HEART CATH AND CORONARY ANGIOGRAPHY;  Surgeon: Burnell Blanks, MD;  Location: [facility] CV LAB;  Service: Cardiovascular;  Laterality: N/A;  . VASECTOMY      There were no vitals filed for this visit.   Subjective Assessment - 03/08/19 1055    Subjective  pt is a 79 y.o M with CC of shortness of breath that has been going on for 6-8 months. He reports having increased fatigue that has been going since this began.    Patient Stated Goals  to get heart better    Currently in Pain?  No/denies         [facility] PT Assessment - 03/08/19 1050      Assessment   Medical Diagnosis  Severe Aortic stenosis    Referring Provider (PT)  Burnell Blanks, MD    Onset Date/Surgical Date  --   6-8 months   Hand Dominance  Right      Precautions   Precautions  None      Restrictions   Weight Bearing Restrictions  No      Balance Screen   Has the patient  fallen in the past 6 months  No    Has the patient had a decrease in activity level because of a fear of falling?   No    Is the patient reluctant to leave their home because of a fear of falling?   No      Home Environment   Living Environment  Private residence    Living Arrangements  Alone    Type of [place] Access  Level entry    Home Layout  One level    Intercourse  None      Prior Function   Level of [place] with basic ADLs      ROM / Strength   AROM / PROM / Strength  AROM;Strength      AROM   Overall AROM   Within functional limits for tasks performed      Strength   Overall Strength  Within functional limits for tasks performed    Strength Assessment Site  Hand    Right Hand Grip (lbs)  74    Left Hand Grip (lbs)  70       [facility] Pre-Surgical  Assessment - 03/08/19 0001    5 Meter Walk Test- trial 1  3 sec    5 Meter Walk Test- trial 2  2 sec.     5 Meter Walk Test- trial 3  2 sec.    5 meter walk test average  2.33 sec    4 Stage Balance Test tolerated for:   3 sec.    4 Stage Balance Test Position  4    Sit To Stand Test- trial 1  15 sec.    ADL/IADL Independent with:  Bathing;Finances;Meal prep;Yard work    Best boy with:  Dressing   difficulty with donning shoes/ socks   ADL/IADL Fraility Index  Vulnerable    6 Minute Walk- Baseline  yes    BP (mmHg)  122/77    HR (bpm)  73    02 Sat (%RA)  95 %    Modified Borg Scale for Dyspnea  2- Mild shortness of breath    Perceived Rate of Exertion (Borg)  6-    6 Minute Walk Post Test  yes    BP (mmHg)  152/71    HR (bpm)  124    02 Sat (%RA)  94 %    Modified Borg Scale for Dyspnea  3- Moderate shortness of breath or breathing difficulty    Perceived Rate of Exertion (Borg)  13- Somewhat hard    Aerobic Endurance Distance Walked  [phone]    Endurance additional comments  pt is .75% limited compared to age related norm              Objective measurements completed on examination: See above findings.                           Plan - 03/08/19 1107    Clinical Impression Statement  see assessment in note.    Stability/Clinical Decision Making  Stable/Uncomplicated    Clinical Decision Making  Low    PT Frequency  One time visit    PT Next Visit Plan  Pre-TAVR evaluation    Consulted and Agree with Plan of Care  Patient        Clinical Impression Statement: Pt is a 79 yo M presenting to OP PT for evaluation prior  to possible TAVR surgery due to severe aortic stenosis. Pt reports onset of SOB and fatigue approximately 6-8 months ago. Symptoms are limiting endurance. Pt presents with good ROM and strength, good balance and is assessed as low  at high fall risk 4 stage balance test, good walking speed and good aerobic endurance per  6 minute walk test. Pt ambulated 1716 feet without requiring rest break throughout 6 min walk test. At end of test, patient's HR was 124 bpm and O2 was 94% on room air. Pt reported 3/10 shortness of breath on modified scale for dyspnea. Pt ambulated a total of 1716 feet in 6 minute walk. SOB and fatigue increased significantly with 6 minute walk test. Based on the Short Physical Performance Battery, patient has a frailty rating of 12/12 with </= 5/12 considered frail.    Patient demonstrated the following deficits and impairments:     Visit Diagnosis: Abnormal posture     Problem List Patient Active Problem List   Diagnosis Date Noted  . Aortic stenosis, severe   . S/P  lap bilateral inguinal hernia repair Feb 2020 03/01/2018  . Retroperitoneal fluid collection 04/20/2016  . Hypotension 11/24/2015  . History of pancreatitis 11/24/2015  . Jejunostomy tube in situ ([place]) 11/24/2015  . Volume depletion 11/24/2015  . Pancreatitis 08/12/2015  . Cholangitis 08/12/2015  . Pulmonary emboli ([place]) 08/12/2015  . Duodenal stricture 08/10/2015  . Gastric outlet obstruction 08/10/2015  . H/O bypass gastrojejunostomy 08/10/2015  . Pancreatic pseudocyst 08/01/2015  . Alcohol use 07/03/2015  . Atrial fibrillation ([place]) 07/03/2015  . Acute pancreatitis 06/26/2015  . Insomnia 05/15/2011  . Pericarditis 05/09/2011  . Hypothyroidism 05/08/2011   Starr Lake PT, DPT, LAT, ATC  03/08/19  11:27 AM      [facility] [address] [place], [place], [address] Phone: [phone]   Fax:  [phone]  Name: Andrew Watson MRN: [id number] Date of Birth: Jun 20, 1940

## 2019-03-09 NOTE — Telephone Encounter (Signed)
Agree with Gavin Pound

## 2019-03-10 ENCOUNTER — Ambulatory Visit (INDEPENDENT_AMBULATORY_CARE_PROVIDER_SITE_OTHER): Payer: Medicare Other | Admitting: Urology

## 2019-03-10 ENCOUNTER — Other Ambulatory Visit: Payer: Self-pay

## 2019-03-10 ENCOUNTER — Ambulatory Visit (HOSPITAL_COMMUNITY)
Admission: RE | Admit: 2019-03-10 | Discharge: 2019-03-10 | Disposition: A | Discharge status: Home / Self Care | Payer: Medicare Other | Source: Ambulatory Visit | Attending: Urology | Admitting: Urology

## 2019-03-10 VITALS — BP 147/74 | HR 71 | Temp 97.5°F | Ht 70.0 in | Wt 218.0 lb

## 2019-03-10 DIAGNOSIS — N200 Calculus of kidney: Secondary | ICD-10-CM

## 2019-03-10 DIAGNOSIS — N201 Calculus of ureter: Secondary | ICD-10-CM | POA: Diagnosis not present

## 2019-03-10 LAB — POCT URINALYSIS DIPSTICK
Bilirubin, UA: NEGATIVE
Glucose, UA: NEGATIVE
Ketones, UA: NEGATIVE
Leukocytes, UA: NEGATIVE
Nitrite, UA: NEGATIVE
Protein, UA: NEGATIVE
Spec Grav, UA: 1.01 (ref 1.010–1.025)
Urobilinogen, UA: 0.2 E.U./dL
pH, UA: 5 (ref 5.0–8.0)

## 2019-03-10 NOTE — Patient Instructions (Signed)
Ureteroscopy Ureteroscopy is a procedure to check for and treat problems inside part of the urinary tract. In this procedure, a thin, tube-shaped instrument with a light at the end (ureteroscope) is used to look at the inside of the kidneys and the ureters, which are the tubes that carry urine from the kidneys to the bladder. The ureteroscope is inserted into one or both of the ureters. You may need this procedure if you have frequent urinary tract infections (UTIs), blood in your urine, or a stone in one of your ureters. A ureteroscopy can be done to find the cause of urine blockage in a ureter and to evaluate other abnormalities inside the ureters or kidneys. If stones are found, they can be removed during the procedure. Polyps, abnormal tissue, and some types of tumors can also be removed or treated. The ureteroscope may also have a tool to remove tissue to be checked for disease under a microscope (biopsy). Tell a health care provider about:  Any allergies you have.  All medicines you are taking, including vitamins, herbs, eye drops, creams, and over-the-counter medicines.  Any problems you or family members have had with anesthetic medicines.  Any blood disorders you have.  Any surgeries you have had.  Any medical conditions you have.  Whether you are pregnant or may be pregnant. What are the risks? Generally, this is a safe procedure. However, problems may occur, including:  Bleeding.  Infection.  Allergic reactions to medicines.  Scarring that narrows the ureter (stricture).  Creating a hole in the ureter (perforation). What happens before the procedure? Staying hydrated Follow instructions from your health care provider about hydration, which may include:  Up to 2 hours before the procedure - you may continue to drink clear liquids, such as water, clear fruit juice, black coffee, and plain tea. Eating and drinking restrictions Follow instructions from your health care  provider about eating and drinking, which may include:  8 hours before the procedure - stop eating heavy meals or foods such as meat, fried foods, or fatty foods.  6 hours before the procedure - stop eating light meals or foods, such as toast or cereal.  6 hours before the procedure - stop drinking milk or drinks that contain milk.  2 hours before the procedure - stop drinking clear liquids. Medicines  Ask your health care provider about: ? Changing or stopping your regular medicines. This is especially important if you are taking diabetes medicines or blood thinners. ? Taking medicines such as aspirin and ibuprofen. These medicines can thin your blood. Do not take these medicines before your procedure if your health care provider instructs you not to.  You may be given antibiotic medicine to help prevent infection. General instructions  You may have a urine sample taken to check for infection.  Plan to have someone take you home from the hospital or clinic. What happens during the procedure?   To reduce your risk of infection: ? Your health care team will wash or sanitize their hands. ? Your skin will be washed with soap.  An IV tube will be inserted into one of your veins.  You will be given one of the following: ? A medicine to help you relax (sedative). ? A medicine to make you fall asleep (general anesthetic). ? A medicine that is injected into your spine to numb the area below and slightly above the injection site (spinal anesthetic).  To lower your risk of infection, you may be given an antibiotic medicine   by an injection or through the IV tube.  The opening from which you urinate (urethra) will be cleaned with a germ-killing solution.  The ureteroscope will be passed through your urethra into your bladder.  A salt-water solution will flow through the ureteroscope to fill your bladder. This will help the health care provider see the openings of your ureters more  clearly.  Then, the ureteroscope will be passed into your ureter. ? If a growth is found, a piece of it may be removed so it can be examined under a microscope (biopsy). ? If a stone is found, it may be removed through the ureteroscope, or the stone may be broken up using a laser, shock waves, or electrical energy. ? In some cases, if the ureter is too small, a tube may be inserted that keeps the ureter open (ureteral stent). The stent may be left in place for 1 or 2 weeks to keep the ureter open, and then the ureteroscopy procedure will be performed.  The scope will be removed, and your bladder will be emptied. The procedure may vary among health care providers and hospitals. What happens after the procedure?  Your blood pressure, heart rate, breathing rate, and blood oxygen level will be monitored until the medicines you were given have worn off.  You may be asked to urinate.  Donot drive for 24 hours if you were given a sedative. This information is not intended to replace advice given to you by your health care provider. Make sure you discuss any questions you have with your health care provider. Document Revised: 12/25/2016 Document Reviewed: 10/25/2015 Elsevier Patient Education  2020 [employer]  

## 2019-03-10 NOTE — H&P (View-Only) (Signed)
Subjective: 1. Nephrolithiasis     Andrew Watson is sent back in consultation by his cardiologist for a 7 x 13 mm left UPJ stone with obstruction  found on a CT earlier this month during an admission for aortic stenosis.  He was last seen by me on 02/04/18 for a history of hematuria and had a 86mm LLP stone on imaging at that time.  He has no pain or hematuria.   KUB today confirms the stone in the area of the left UPJ.    He has been seen by Dr. Cyndia Bent and will be scheduled for a transcatheter aortic valve replacement after definitive stone management.   He has been cleared by cardiology to proceed with stone management.  ROS:  Review of Systems  Respiratory: Positive for shortness of breath.     No Known Allergies  Past Medical History:  Diagnosis Date  . Acute pancreatitis 2017   [facility]  . Aortic valve stenosis   . Arthritis   . CAD (coronary artery disease) 05/07/2011   Mild atherosclerosis by cardiac catheterization February 2021  . GERD (gastroesophageal reflux disease)   . History of anemia   . History of atrial fibrillation 2013   Reportedly limited episode  . History of kidney stones   . History of MRSA infection   . History of pulmonary embolism 2017  . Hypothyroidism   . Pancreatitis 05/2015  . TIA (transient ischemic attack)    November 2020    Past Surgical History:  Procedure Laterality Date  . BACK SURGERY    . CHOLECYSTECTOMY    . COLONOSCOPY  07/08/2011   Procedure: COLONOSCOPY;  Surgeon: Rogene Houston, MD;  Location: [facility] SUITE;  Service: Endoscopy;  Laterality: N/A;  930  . DUODENOTOMY  09/29/2016   repair of fistula, jejunal resection  . INGUINAL HERNIA REPAIR N/A 03/01/2018   Procedure: LAPAROSCOPIC TEP BILATERAL INGUINAL HERNIA REPAIR;  Surgeon: Johnathan Hausen, MD;  Location: [facility] ORS;  Service: General;  Laterality: N/A;  . JEJUNOSTOMY FEEDING TUBE    . KNEE ARTHROSCOPY Left   . LEFT HEART CATHETERIZATION WITH CORONARY ANGIOGRAM N/A 05/07/2011    Procedure: LEFT HEART CATHETERIZATION WITH CORONARY ANGIOGRAM;  Surgeon: Peter M Martinique, MD;  Location: [facility] CATH LAB;  Service: Cardiovascular;  Laterality: N/A;  . PEG TUBE PLACEMENT    . RIGHT/LEFT HEART CATH AND CORONARY ANGIOGRAPHY N/A 02/28/2019   Procedure: RIGHT/LEFT HEART CATH AND CORONARY ANGIOGRAPHY;  Surgeon: Burnell Blanks, MD;  Location: [place] CV LAB;  Service: Cardiovascular;  Laterality: N/A;  . VASECTOMY      Social History   Socioeconomic History  . Marital status: Widowed    Spouse name: [name]  . Number of children: Not on file  . Years of education: Not on file  . Highest education level: High school graduate  Occupational History    Comment: retired   Tobacco Use  . Smoking status: Never Smoker  . Smokeless tobacco: Never Used  Substance and Sexual Activity  . Alcohol use: No    Comment: none since Jun 26, 2015  . Drug use: No  . Sexual activity: Not on file  Other Topics Concern  . Not on file  Social History Narrative   Lives with wife   Caffeine- coffee 2 c daily, usually decaf   Social Determinants of Health   Financial Resource Strain:   . Difficulty of Paying Living Expenses: Not on file  Food Insecurity:   . Worried About Running  Out of Food in the Last Year: Not on file  . Ran Out of Food in the Last Year: Not on file  Transportation Needs:   . Lack of Transportation (Medical): Not on file  . Lack of Transportation (Non-Medical): Not on file  Physical Activity:   . Days of Exercise per Week: Not on file  . Minutes of Exercise per Session: Not on file  Stress:   . Feeling of Stress : Not on file  Social Connections:   . Frequency of Communication with Friends and Family: Not on file  . Frequency of Social Gatherings with Friends and Family: Not on file  . Attends Religious Services: Not on file  . Active Member of Clubs or Organizations: Not on file  . Attends Archivist Meetings: Not on file  . Marital Status: Not  on file  Intimate Partner Violence:   . Fear of Current or Ex-Partner: Not on file  . Emotionally Abused: Not on file  . Physically Abused: Not on file  . Sexually Abused: Not on file    Family History  Problem Relation Age of Onset  . Arthritis Mother   . Other Brother        staph infection    Anti-infectives: Anti-infectives (From admission, onward)   None      Current Outpatient Medications  Medication Sig Dispense Refill  . amoxicillin (AMOXIL) 500 MG capsule Take 2,000 mg by mouth See admin instructions. Take 4 capsules (2000 mg) 1 hour prior dental procedures.    [name] Kitchen aspirin 81 MG chewable tablet Chew 1 tablet (81 mg total) by mouth daily. 30 tablet 0  . atorvastatin (LIPITOR) 10 MG tablet Take 1 tablet (10 mg total) by mouth daily. 90 tablet 2  . levothyroxine (SYNTHROID, LEVOTHROID) 112 MCG tablet Take 224 mcg by mouth daily before breakfast.     . metoprolol tartrate (LOPRESSOR) 50 MG tablet Take one tablet by mouth at 8:30 AM on 03/06/2019 1 tablet 0   No current facility-administered medications for this visit.     Objective: BP (!) 147/74   Pulse 71   Temp (!) 97.5 F (36.4 C)   Ht 5\' 10"  (1.778 m)   Wt 218 lb (98.9 kg)   BMI 31.28 kg/m     Physical Exam Vitals reviewed.  Constitutional:      Appearance: Normal appearance.  HENT:     Head: Normocephalic and atraumatic.  Cardiovascular:     Rate and Rhythm: Normal rate. Rhythm irregular.     Heart sounds: Normal heart sounds.  Pulmonary:     Effort: Pulmonary effort is normal.     Comments: Decreased BS in right base.  Musculoskeletal:     Cervical back: Normal range of motion and neck supple.  Neurological:     Mental Status: He is alert.     Lab Results:  Recent Results (from the past 2160 hour(s))  SARS CORONAVIRUS 2 (TAT 6-24 HRS) Nasopharyngeal Nasopharyngeal Swab     Status: None   Collection Time: 02/24/19  7:14 AM   Specimen: Nasopharyngeal Swab  Result Value Ref Range   SARS  Coronavirus 2 NEGATIVE NEGATIVE    Comment: (NOTE) SARS-CoV-2 target nucleic acids are NOT DETECTED. The SARS-CoV-2 RNA is generally detectable in upper and lower respiratory specimens during the acute phase of infection. Negative results do not preclude SARS-CoV-2 infection, do not rule out co-infections with other pathogens, and should not be used as the sole basis for treatment or other  patient management decisions. Negative results must be combined with clinical observations, patient history, and epidemiological information. The expected result is Negative. Fact Sheet for Patients: SugarRoll.be Fact Sheet for Healthcare Providers: https://www.woods-mathews.com/ This test is not yet approved or cleared by the [place] FDA and  has been authorized for detection and/or diagnosis of SARS-CoV-2 by FDA under an Emergency Use Authorization (EUA). This EUA will remain  in effect (meaning this test can be used) for the duration of the COVID-19 declaration under Section 56 4(b)(1) of the Act, 21 U.S.C. section 360bbb-3(b)(1), unless the authorization is terminated or revoked sooner. Performed at [facility], [place] [address] [place], [place] [id number]   Basic metabolic panel     Status: Abnormal   Collection Time: 02/24/19  1:59 PM  Result Value Ref Range   Glucose 114 (H) 65 - 99 mg/dL   BUN 21 8 - 27 mg/dL   Creatinine, Ser 1.18 0.76 - 1.27 mg/dL   GFR calc non Af Amer 58 (L) >59 mL/min/1.73   GFR calc Af Amer 67 >59 mL/min/1.73   BUN/Creatinine Ratio 18 10 - 24   Sodium 143 134 - 144 mmol/L   Potassium 4.5 3.5 - 5.2 mmol/L   Chloride 104 96 - 106 mmol/L   CO2 26 20 - 29 mmol/L   Calcium 9.6 8.6 - 10.2 mg/dL  CBC     Status: Abnormal   Collection Time: 02/24/19  1:59 PM  Result Value Ref Range   WBC 5.5 3.4 - 10.8 x10E3/uL   RBC 4.33 4.14 - 5.80 x10E6/uL   Hemoglobin 13.0 13.0 - 17.7 g/dL   Hematocrit 39.0 37.5 - 51.0 %    MCV 90 79 - 97 fL   MCH 30.0 26.6 - 33.0 pg   MCHC 33.3 31.5 - 35.7 g/dL   RDW 13.1 11.6 - 15.4 %   Platelets 149 (L) 150 - 450 x10E3/uL  POCT I-Stat EG7     Status: Abnormal   Collection Time: 02/28/19  1:01 PM  Result Value Ref Range   pH, Ven 7.388 7.250 - 7.430   pCO2, Ven 43.7 (L) 44.0 - 60.0 mmHg   pO2, Ven 36.0 32.0 - 45.0 mmHg   Bicarbonate 26.3 20.0 - 28.0 mmol/L   TCO2 28 22 - 32 mmol/L   O2 Saturation 67.0 %   Acid-Base Excess 1.0 0.0 - 2.0 mmol/L   Sodium 142 135 - 145 mmol/L   Potassium 4.3 3.5 - 5.1 mmol/L   Calcium, Ion 1.32 1.15 - 1.40 mmol/L   HCT 35.0 (L) 39.0 - 52.0 %   Hemoglobin 11.9 (L) 13.0 - 17.0 g/dL   Patient temperature HIDE    Sample type VENOUS    Comment NOTIFIED PHYSICIAN   I-STAT 7, (LYTES, BLD GAS, ICA, H+H)     Status: Abnormal   Collection Time: 02/28/19  1:01 PM  Result Value Ref Range   pH, Arterial 7.437 7.350 - 7.450   pCO2 arterial 36.2 32.0 - 48.0 mmHg   pO2, Arterial 135.0 (H) 83.0 - 108.0 mmHg   Bicarbonate 24.4 20.0 - 28.0 mmol/L   TCO2 25 22 - 32 mmol/L   O2 Saturation 99.0 %   Sodium 143 135 - 145 mmol/L   Potassium 4.1 3.5 - 5.1 mmol/L   Calcium, Ion 1.19 1.15 - 1.40 mmol/L   HCT 33.0 (L) 39.0 - 52.0 %   Hemoglobin 11.2 (L) 13.0 - 17.0 g/dL   Patient temperature HIDE    Sample type  ARTERIAL   POCT urinalysis dipstick     Status: None   Collection Time: 03/10/19 11:33 AM  Result Value Ref Range   Color, UA yellow    Clarity, UA     Glucose, UA Negative Negative   Bilirubin, UA neg    Ketones, UA neg    Spec Grav, UA 1.010 1.010 - 1.025   Blood, UA hemo trace    pH, UA 5.0 5.0 - 8.0   Protein, UA Negative Negative   Urobilinogen, UA 0.2 0.2 or 1.0 E.U./dL   Nitrite, UA neg    Leukocytes, UA Negative Negative   Appearance clear    Odor       BMET No results for input(s): NA, K, CL, CO2, GLUCOSE, BUN, CREATININE, CALCIUM in the last 72 hours. PT/INR No results for input(s): LABPROT, INR in the last 72  hours. ABG No results for input(s): PHART, HCO3 in the last 72 hours.  Invalid input(s): PCO2, PO2  Studies/Results: IMPRESSION: 1. Vascular findings and measurements pertinent to potential TAVR procedure, as detailed. 2. Diffuse thickening and coarse calcification of the aortic valve, compatible with the reported history of severe symptomatic aortic stenosis. 3. Obstructing 7 mm left UPJ stone with mild-to-moderate left hydronephrosis. 4. Small basilar right pleural effusion with smooth right pleural thickening, suggesting chronic loculated right pleural effusion. 5. Findings at the right lung base most compatible with rounded atelectasis. Follow-up chest CT advised in 3 months to document stability of this finding. 6. Mild cardiomegaly. Coronary atherosclerosis. 7. Nonspecific mild diffuse bladder wall thickening, probably due to chronic bladder outlet obstruction by the mildly enlarged prostate. 8. Right thyroid 1.5 cm nodule. Thyroid ultrasound correlation  KUB 03/10/19  The LUPJ stone is visible on the KUB from today.   It measures 16 x 89mm.  indicated if not previously performed.(Ref: J Am Coll Radiol. 2015 Feb;12(2): 143-50). 9. Aortic Atherosclerosis (ICD10-I70.0).  These results will be called to the ordering clinician or representative by the Radiologist Assistant, and communication documented in the PACS or zVision Dashboard.   Electronically Signed   By: Ilona Sorrel M.D.   On: 03/06/2019 13:04   Assessment/Plan: 1. 16 x 7mm left UPJ stone with obstruction.    I discussed ESWL and URS and will proceed with ureteroscopy next Tuesday.   I have reviewed the risks and benefits in detail as noted in the patient information sheet.   No orders of the defined types were placed in this encounter.    Orders Placed This Encounter  Procedures  . POCT urinalysis dipstick     Return in about 1 week (around 03/17/2019) for 1-2 weeks for post op f/u.   Scheduled for  ureteroscopy on 03/14/19.    CC: Dr. Antony Contras, Dr. Arvid Right and Dr. Lauree Chandler.      Irine Seal 03/10/2019 [phone]

## 2019-03-10 NOTE — Patient Instructions (Signed)
DUE TO COVID-19 ONLY ONE VISITOR IS ALLOWED TO COME WITH YOU AND STAY IN THE WAITING ROOM ONLY DURING PRE OP AND PROCEDURE DAY OF SURGERY. THE 1 VISITOR MAY VISIT WITH YOU AFTER SURGERY IN YOUR PRIVATE ROOM DURING VISITING HOURS ONLY!  YOU NEED TO HAVE A COVID 19 TEST ON__2/12_____ @_______ , THIS TEST MUST BE DONE BEFORE SURGERY, COME  [place] , [id number].  ([place]) ONCE YOUR COVID TEST IS COMPLETED, PLEASE BEGIN THE QUARANTINE INSTRUCTIONS AS OUTLINED IN YOUR HANDOUT.                [name]    Your procedure is scheduled on: 03/14/19   Report to [facility] Main  Entrance   Report to Short  Stay at 5:30 AM     Call this number if you have problems the morning of surgery [phone]    Remember: Do not eat food or drink liquids :After Midnight  . BRUSH YOUR TEETH MORNING OF SURGERY AND RINSE YOUR MOUTH OUT, NO CHEWING GUM CANDY OR MINTS.     Take these medicines the morning of surgery with A SIP OF WATER: Metoprolol,Levothyroxine                                You may not have any metal on your body including               piercings  Do not wear jewelry,  lotions, powders or  deodorant                         Men may shave face and neck.   Do not bring valuables to the hospital. [place].  Contacts, dentures or bridgework may not be worn into surgery.       Patients discharged the day of surgery will not be allowed to drive home.  IF YOU ARE HAVING SURGERY AND GOING HOME THE SAME DAY, YOU MUST HAVE AN ADULT TO DRIVE YOU HOME AND BE WITH YOU FOR 24 HOURS. YOU MAY GO HOME BY TAXI OR UBER OR ORTHERWISE, BUT AN ADULT MUST ACCOMPANY YOU HOME AND STAY WITH YOU FOR 24 HOURS.  Name and phone number of your driver:  Special Instructions: N/A              Please read over the following fact sheets you were  given: _____________________________________________________________________             [facility] - Preparing for Surgery Before surgery, you can play an important role.  Because skin is not sterile, your skin needs to be as free of germs as possible.  You can reduce the number of germs on your skin by washing with CHG (chlorahexidine gluconate) soap before surgery.  CHG is an antiseptic cleaner which kills germs and bonds with the skin to continue killing germs even after washing. Please DO NOT use if you have an allergy to CHG or antibacterial soaps.  If your skin becomes reddened/irritated stop using the CHG and inform your nurse when you arrive at Short Stay. Do not shave (including legs and underarms) for at least 48 hours prior to the first CHG shower.  You may shave your face/neck. Please follow these instructions carefully:  1.  Shower with CHG Soap the night before surgery and the  morning of Surgery.  2.  If you choose to wash your hair, wash your hair first as usual with your  normal  shampoo.  3.  After you shampoo, rinse your hair and body thoroughly to remove the  shampoo.                                        4.  Use CHG as you would any other liquid soap.  You can apply chg directly  to the skin and wash                       Gently with a scrungie or clean washcloth.  5.  Apply the CHG Soap to your body ONLY FROM THE NECK DOWN.   Do not use on face/ open                           Wound or open sores. Avoid contact with eyes, ears mouth and genitals (private parts).                       Wash face,  Genitals (private parts) with your normal soap.             6.  Wash thoroughly, paying special attention to the area where your surgery  will be performed.  7.  Thoroughly rinse your body with warm water from the neck down.  8.  DO NOT shower/wash with your normal soap after using and rinsing off  the CHG Soap.             9.  Pat yourself dry with a clean towel.            10.   Wear clean pajamas.            11.  Place clean sheets on your bed the night of your first shower and do not  sleep with pets. Day of Surgery : Do not apply any lotions/deodorants the morning of surgery.  Please wear clean clothes to the hospital/surgery center.  FAILURE TO FOLLOW THESE INSTRUCTIONS MAY RESULT IN THE CANCELLATION OF YOUR SURGERY PATIENT SIGNATURE_________________________________  NURSE SIGNATURE__________________________________  ________________________________________________________________________

## 2019-03-10 NOTE — Progress Notes (Signed)
Subjective: 1. Nephrolithiasis     Andrew Watson is sent back in consultation by his cardiologist for a 7 x 13 mm left UPJ stone with obstruction  found on a CT earlier this month during an admission for aortic stenosis.  He was last seen by me on 02/04/18 for a history of hematuria and had a 67mm LLP stone on imaging at that time.  He has no pain or hematuria.   KUB today confirms the stone in the area of the left UPJ.    He has been seen by Dr. Cyndia Bent and will be scheduled for a transcatheter aortic valve replacement after definitive stone management.   He has been cleared by cardiology to proceed with stone management.  ROS:  Review of Systems  Respiratory: Positive for shortness of breath.     No Known Allergies  Past Medical History:  Diagnosis Date  . Acute pancreatitis 2017   [facility]  . Aortic valve stenosis   . Arthritis   . CAD (coronary artery disease) 05/07/2011   Mild atherosclerosis by cardiac catheterization February 2021  . GERD (gastroesophageal reflux disease)   . History of anemia   . History of atrial fibrillation 2013   Reportedly limited episode  . History of kidney stones   . History of MRSA infection   . History of pulmonary embolism 2017  . Hypothyroidism   . Pancreatitis 05/2015  . TIA (transient ischemic attack)    November 2020    Past Surgical History:  Procedure Laterality Date  . BACK SURGERY    . CHOLECYSTECTOMY    . COLONOSCOPY  07/08/2011   Procedure: COLONOSCOPY;  Surgeon: Rogene Houston, MD;  [facility];  Service: Endoscopy;  Laterality: N/A;  930  . DUODENOTOMY  09/29/2016   repair of fistula, jejunal resection  . INGUINAL HERNIA REPAIR N/A 03/01/2018   Procedure: LAPAROSCOPIC TEP BILATERAL INGUINAL HERNIA REPAIR;  Surgeon: Johnathan Hausen, MD;  Location: [facility] ORS;  Service: General;  Laterality: N/A;  . JEJUNOSTOMY FEEDING TUBE    . KNEE ARTHROSCOPY Left   . LEFT HEART CATHETERIZATION WITH CORONARY ANGIOGRAM N/A 05/07/2011    Procedure: LEFT HEART CATHETERIZATION WITH CORONARY ANGIOGRAM;  Surgeon: Peter M Martinique, MD;  Location: [facility] CATH LAB;  Service: Cardiovascular;  Laterality: N/A;  . PEG TUBE PLACEMENT    . RIGHT/LEFT HEART CATH AND CORONARY ANGIOGRAPHY N/A 02/28/2019   Procedure: RIGHT/LEFT HEART CATH AND CORONARY ANGIOGRAPHY;  Surgeon: Burnell Blanks, MD;  Location: [place] CV LAB;  Service: Cardiovascular;  Laterality: N/A;  . VASECTOMY      Social History   Socioeconomic History  . Marital status: Widowed    Spouse name: [name]  . Number of children: Not on file  . Years of education: Not on file  . Highest education level: High school graduate  Occupational History    Comment: retired   Tobacco Use  . Smoking status: Never Smoker  . Smokeless tobacco: Never Used  Substance and Sexual Activity  . Alcohol use: No    Comment: none since Jun 26, 2015  . Drug use: No  . Sexual activity: Not on file  Other Topics Concern  . Not on file  Social History Narrative   Lives with wife   Caffeine- coffee 2 c daily, usually decaf   Social Determinants of Health   Financial Resource Strain:   . Difficulty of Paying Living Expenses: Not on file  Food Insecurity:   . Worried About Running  Out of Food in the Last Year: Not on file  . Ran Out of Food in the Last Year: Not on file  Transportation Needs:   . Lack of Transportation (Medical): Not on file  . Lack of Transportation (Non-Medical): Not on file  Physical Activity:   . Days of Exercise per Week: Not on file  . Minutes of Exercise per Session: Not on file  Stress:   . Feeling of Stress : Not on file  Social Connections:   . Frequency of Communication with Friends and Family: Not on file  . Frequency of Social Gatherings with Friends and Family: Not on file  . Attends Religious Services: Not on file  . Active Member of Clubs or Organizations: Not on file  . Attends Archivist Meetings: Not on file  . Marital Status: Not  on file  Intimate Partner Violence:   . Fear of Current or Ex-Partner: Not on file  . Emotionally Abused: Not on file  . Physically Abused: Not on file  . Sexually Abused: Not on file    Family History  Problem Relation Age of Onset  . Arthritis Mother   . Other Brother        staph infection    Anti-infectives: Anti-infectives (From admission, onward)   None      Current Outpatient Medications  Medication Sig Dispense Refill  . amoxicillin (AMOXIL) 500 MG capsule Take 2,000 mg by mouth See admin instructions. Take 4 capsules (2000 mg) 1 hour prior dental procedures.    [name] Kitchen aspirin 81 MG chewable tablet Chew 1 tablet (81 mg total) by mouth daily. 30 tablet 0  . atorvastatin (LIPITOR) 10 MG tablet Take 1 tablet (10 mg total) by mouth daily. 90 tablet 2  . levothyroxine (SYNTHROID, LEVOTHROID) 112 MCG tablet Take 224 mcg by mouth daily before breakfast.     . metoprolol tartrate (LOPRESSOR) 50 MG tablet Take one tablet by mouth at 8:30 AM on 03/06/2019 1 tablet 0   No current facility-administered medications for this visit.     Objective: BP (!) 147/74   Pulse 71   Temp (!) 97.5 F (36.4 C)   Ht 5\' 10"  (1.778 m)   Wt 218 lb (98.9 kg)   BMI 31.28 kg/m     Physical Exam Vitals reviewed.  Constitutional:      Appearance: Normal appearance.  HENT:     Head: Normocephalic and atraumatic.  Cardiovascular:     Rate and Rhythm: Normal rate. Rhythm irregular.     Heart sounds: Normal heart sounds.  Pulmonary:     Effort: Pulmonary effort is normal.     Comments: Decreased BS in right base.  Musculoskeletal:     Cervical back: Normal range of motion and neck supple.  Neurological:     Mental Status: He is alert.     Lab Results:  Recent Results (from the past 2160 hour(s))  SARS CORONAVIRUS 2 (TAT 6-24 HRS) Nasopharyngeal Nasopharyngeal Swab     Status: None   Collection Time: 02/24/19  7:14 AM   Specimen: Nasopharyngeal Swab  Result Value Ref Range   SARS  Coronavirus 2 NEGATIVE NEGATIVE    Comment: (NOTE) SARS-CoV-2 target nucleic acids are NOT DETECTED. The SARS-CoV-2 RNA is generally detectable in upper and lower respiratory specimens during the acute phase of infection. Negative results do not preclude SARS-CoV-2 infection, do not rule out co-infections with other pathogens, and should not be used as the sole basis for treatment or other  patient management decisions. Negative results must be combined with clinical observations, patient history, and epidemiological information. The expected result is Negative. Fact Sheet for Patients: SugarRoll.be Fact Sheet for Healthcare Providers: https://www.woods-mathews.com/ This test is not yet approved or cleared by the [place] FDA and  has been authorized for detection and/or diagnosis of SARS-CoV-2 by FDA under an Emergency Use Authorization (EUA). This EUA will remain  in effect (meaning this test can be used) for the duration of the COVID-19 declaration under Section 56 4(b)(1) of the Act, 21 U.S.C. section 360bbb-3(b)(1), unless the authorization is terminated or revoked sooner. Performed at [facility], [place] [address], [place], [place] [id number]   Basic metabolic panel     Status: Abnormal   Collection Time: 02/24/19  1:59 PM  Result Value Ref Range   Glucose 114 (H) 65 - 99 mg/dL   BUN 21 8 - 27 mg/dL   Creatinine, Ser 1.18 0.76 - 1.27 mg/dL   GFR calc non Af Amer 58 (L) >59 mL/min/1.73   GFR calc Af Amer 67 >59 mL/min/1.73   BUN/Creatinine Ratio 18 10 - 24   Sodium 143 134 - 144 mmol/L   Potassium 4.5 3.5 - 5.2 mmol/L   Chloride 104 96 - 106 mmol/L   CO2 26 20 - 29 mmol/L   Calcium 9.6 8.6 - 10.2 mg/dL  CBC     Status: Abnormal   Collection Time: 02/24/19  1:59 PM  Result Value Ref Range   WBC 5.5 3.4 - 10.8 x10E3/uL   RBC 4.33 4.14 - 5.80 x10E6/uL   Hemoglobin 13.0 13.0 - 17.7 g/dL   Hematocrit 39.0 37.5 - 51.0 %    MCV 90 79 - 97 fL   MCH 30.0 26.6 - 33.0 pg   MCHC 33.3 31.5 - 35.7 g/dL   RDW 13.1 11.6 - 15.4 %   Platelets 149 (L) 150 - 450 x10E3/uL  POCT I-Stat EG7     Status: Abnormal   Collection Time: 02/28/19  1:01 PM  Result Value Ref Range   pH, Ven 7.388 7.250 - 7.430   pCO2, Ven 43.7 (L) 44.0 - 60.0 mmHg   pO2, Ven 36.0 32.0 - 45.0 mmHg   Bicarbonate 26.3 20.0 - 28.0 mmol/L   TCO2 28 22 - 32 mmol/L   O2 Saturation 67.0 %   Acid-Base Excess 1.0 0.0 - 2.0 mmol/L   Sodium 142 135 - 145 mmol/L   Potassium 4.3 3.5 - 5.1 mmol/L   Calcium, Ion 1.32 1.15 - 1.40 mmol/L   HCT 35.0 (L) 39.0 - 52.0 %   Hemoglobin 11.9 (L) 13.0 - 17.0 g/dL   Patient temperature HIDE    Sample type VENOUS    Comment NOTIFIED PHYSICIAN   I-STAT 7, (LYTES, BLD GAS, ICA, H+H)     Status: Abnormal   Collection Time: 02/28/19  1:01 PM  Result Value Ref Range   pH, Arterial 7.437 7.350 - 7.450   pCO2 arterial 36.2 32.0 - 48.0 mmHg   pO2, Arterial 135.0 (H) 83.0 - 108.0 mmHg   Bicarbonate 24.4 20.0 - 28.0 mmol/L   TCO2 25 22 - 32 mmol/L   O2 Saturation 99.0 %   Sodium 143 135 - 145 mmol/L   Potassium 4.1 3.5 - 5.1 mmol/L   Calcium, Ion 1.19 1.15 - 1.40 mmol/L   HCT 33.0 (L) 39.0 - 52.0 %   Hemoglobin 11.2 (L) 13.0 - 17.0 g/dL   Patient temperature HIDE    Sample type  ARTERIAL   POCT urinalysis dipstick     Status: None   Collection Time: 03/10/19 11:33 AM  Result Value Ref Range   Color, UA yellow    Clarity, UA     Glucose, UA Negative Negative   Bilirubin, UA neg    Ketones, UA neg    Spec Grav, UA 1.010 1.010 - 1.025   Blood, UA hemo trace    pH, UA 5.0 5.0 - 8.0   Protein, UA Negative Negative   Urobilinogen, UA 0.2 0.2 or 1.0 E.U./dL   Nitrite, UA neg    Leukocytes, UA Negative Negative   Appearance clear    Odor       BMET No results for input(s): NA, K, CL, CO2, GLUCOSE, BUN, CREATININE, CALCIUM in the last 72 hours. PT/INR No results for input(s): LABPROT, INR in the last 72  hours. ABG No results for input(s): PHART, HCO3 in the last 72 hours.  Invalid input(s): PCO2, PO2  Studies/Results: IMPRESSION: 1. Vascular findings and measurements pertinent to potential TAVR procedure, as detailed. 2. Diffuse thickening and coarse calcification of the aortic valve, compatible with the reported history of severe symptomatic aortic stenosis. 3. Obstructing 7 mm left UPJ stone with mild-to-moderate left hydronephrosis. 4. Small basilar right pleural effusion with smooth right pleural thickening, suggesting chronic loculated right pleural effusion. 5. Findings at the right lung base most compatible with rounded atelectasis. Follow-up chest CT advised in 3 months to document stability of this finding. 6. Mild cardiomegaly. Coronary atherosclerosis. 7. Nonspecific mild diffuse bladder wall thickening, probably due to chronic bladder outlet obstruction by the mildly enlarged prostate. 8. Right thyroid 1.5 cm nodule. Thyroid ultrasound correlation  KUB 03/10/19  The LUPJ stone is visible on the KUB from today.   It measures 16 x 92mm.  indicated if not previously performed.(Ref: J Am Coll Radiol. 2015 Feb;12(2): 143-50). 9. Aortic Atherosclerosis (ICD10-I70.0).  These results will be called to the ordering clinician or representative by the Radiologist Assistant, and communication documented in the PACS or zVision Dashboard.   Electronically Signed   By: Ilona Sorrel M.D.   On: 03/06/2019 13:04   Assessment/Plan: 1. 16 x 38mm left UPJ stone with obstruction.    I discussed ESWL and URS and will proceed with ureteroscopy next Tuesday.   I have reviewed the risks and benefits in detail as noted in the patient information sheet.   No orders of the defined types were placed in this encounter.    Orders Placed This Encounter  Procedures  . POCT urinalysis dipstick     Return in about 1 week (around 03/17/2019) for 1-2 weeks for post op f/u.   Scheduled for  ureteroscopy on 03/14/19.    CC: Dr. Antony Contras, Dr. Arvid Right and Dr. Lauree Chandler.      Irine Seal 03/10/2019 [phone]

## 2019-03-13 ENCOUNTER — Other Ambulatory Visit: Payer: Self-pay

## 2019-03-13 ENCOUNTER — Encounter (HOSPITAL_COMMUNITY): Payer: Self-pay | Admitting: Urology

## 2019-03-13 ENCOUNTER — Encounter (HOSPITAL_COMMUNITY): Payer: Self-pay | Admitting: Anesthesiology

## 2019-03-13 ENCOUNTER — Other Ambulatory Visit (HOSPITAL_COMMUNITY): Payer: Self-pay

## 2019-03-13 ENCOUNTER — Encounter (HOSPITAL_COMMUNITY): Payer: Self-pay | Admitting: Physician Assistant

## 2019-03-13 ENCOUNTER — Telehealth: Payer: Self-pay | Admitting: Urology

## 2019-03-13 ENCOUNTER — Encounter (HOSPITAL_COMMUNITY): Payer: Self-pay

## 2019-03-13 ENCOUNTER — Encounter (HOSPITAL_COMMUNITY)
Admission: RE | Admit: 2019-03-13 | Discharge: 2019-03-13 | Disposition: A | Discharge status: Home / Self Care | Payer: Medicare Other | Source: Ambulatory Visit | Attending: Urology | Admitting: Urology

## 2019-03-13 ENCOUNTER — Other Ambulatory Visit (HOSPITAL_COMMUNITY)
Admission: RE | Admit: 2019-03-13 | Discharge: 2019-03-13 | Disposition: A | Discharge status: Home / Self Care | Payer: Medicare Other | Source: Ambulatory Visit | Attending: Urology | Admitting: Urology

## 2019-03-13 DIAGNOSIS — U071 COVID-19: Secondary | ICD-10-CM | POA: Insufficient documentation

## 2019-03-13 DIAGNOSIS — Z01812 Encounter for preprocedural laboratory examination: Secondary | ICD-10-CM | POA: Diagnosis present

## 2019-03-13 LAB — BASIC METABOLIC PANEL
Anion gap: 8 (ref 5–15)
BUN: 23 mg/dL (ref 8–23)
CO2: 27 mmol/L (ref 22–32)
Calcium: 9.7 mg/dL (ref 8.9–10.3)
Chloride: 106 mmol/L (ref 98–111)
Creatinine, Ser: 0.99 mg/dL (ref 0.61–1.24)
GFR calc Af Amer: >60 mL/min (ref >60)
GFR calc non Af Amer: >60 mL/min (ref >60)
Glucose, Bld: 108 mg/dL — ABNORMAL HIGH (ref 70–99)
Potassium: 4.6 mmol/L (ref 3.5–5.1)
Sodium: 141 mmol/L (ref 135–145)

## 2019-03-13 LAB — CBC
HCT: 40.5 % (ref 39.0–52.0)
Hemoglobin: 13.1 g/dL (ref 13.0–17.0)
MCH: 30.2 pg (ref 26.0–34.0)
MCHC: 32.3 g/dL (ref 30.0–36.0)
MCV: 93.3 fL (ref 80.0–100.0)
Platelets: 150 10*3/uL (ref 150–400)
RBC: 4.34 MIL/uL (ref 4.22–5.81)
RDW: 14 % (ref 11.5–15.5)
WBC: 5.2 10*3/uL (ref 4.0–10.5)
nRBC: 0 % (ref 0.0–0.2)

## 2019-03-13 LAB — SURGICAL PCR SCREEN
MRSA, PCR: NEGATIVE
Staphylococcus aureus: NEGATIVE

## 2019-03-13 NOTE — Anesthesia Preprocedure Evaluation (Deleted)
Anesthesia Evaluation    Airway        Dental   Pulmonary PE          Cardiovascular + CAD  + dysrhythmias Atrial Fibrillation + Valvular Problems/Murmurs AS   Cardiac Cath 02/28/2019  Mid LAD lesion is 20% stenosed.  1st Sept lesion is 80% stenosed.  2nd Mrg lesion is 20% stenosed.  Hemodynamic findings consistent with aortic valve stenosis.   1. Mild non-obstructive CAD 2. Severe aortic valve stenosis (mean gradient 60.2 mmHg, peak to peak gradient 75 mmHg, AVA 0.55 cm2)  Recommendations: Will continue medical management of mild CAD. Continue with workup for TAVR.   Neuro/Psych TIAnegative psych ROS   GI/Hepatic Neg liver ROS, GERD  Medicated and Controlled,Hx/o Pancreatitis with gastric outlet obstruction S/P gastrojejunostomy   Endo/Other  Hypothyroidism Hyperlipidemia  Renal/GU Renal diseaseLeft UPJ Calculus  negative genitourinary   Musculoskeletal  (+) Arthritis , Osteoarthritis,    Abdominal   Peds  Hematology negative hematology ROS (+)   Anesthesia Other Findings   Reproductive/Obstetrics                             Anesthesia Physical Anesthesia Plan  ASA: III  Anesthesia Plan: General   Post-op Pain Management:    Induction: Intravenous  PONV Risk Score and Plan: 4 or greater and Ondansetron and Treatment may vary due to age or medical condition  Airway Management Planned: LMA  Additional Equipment: Arterial line  Intra-op Plan:   Post-operative Plan: Extubation in OR  Informed Consent: I have reviewed the patients History and Physical, chart, labs and discussed the procedure including the risks, benefits and alternatives for the proposed anesthesia with the patient or authorized representative who has indicated his/her understanding and acceptance.     Dental advisory given  Plan Discussed with: CRNA and Surgeon  Anesthesia Plan Comments: (Phenylephrine  Gtt as needed.)        Anesthesia Quick Evaluation

## 2019-03-13 NOTE — Telephone Encounter (Signed)
Andrew Watson called regarding Mr Andrew Watson surgery that is scheduled for tomorrow with Dr Jeffie Pollock in [place]. She has a question about the hospital stating he needs Covid testing and she states our office told them he didn't need it because hes already had Covid recently. She requests a call back.

## 2019-03-13 NOTE — Progress Notes (Addendum)
Anesthesia Chart Review   Case: [id number] Date/Time: 03/14/19 0716   Procedure: CYSTOSCOPY WITH LEFT  RETROGRADE LEFT URETEROSCOPY WITH HOLMIUM LASER AND STENT PLACEMENT (Left )   Anesthesia type: General   Pre-op diagnosis: LEFT URETERAL PELVIC JUNCTION STONE   Location: [place] [facility] ORS   Surgeons: Irine Seal, MD      DISCUSSION:78 y.o. never smoker with h/o hypothyroidism, TIA GERD, remote h/o atrial fibrillation, CAD, severe Aortic Stenosis undergoing workup for TAVR, left ureteral pelvic junction stone scheduled for above procedure 03/14/19 with Dr. Irine Seal.   Per cardiologist, Dr. Rozann Lesches, 03/08/19, "I think this is a situation where it would be better to take care of the urological issues prior to pursuing TAVR.  Severe aortic stenosis should not preclude his ability to undergo general anesthesia, actually the literature would suggest the main risks are related to bleeding.  He should be able to hold aspirin.  He has an adequate functional capacity to suspect he should be able to tolerate general anesthesia.  If he were to undergo TAVR first, then he would be on Plavix and not be able to hold this in the short-term for urological intervention.  I am forwarding this to Dr. Angelena Form and Dr. Cyndia Bent who are also involved in his care, in case there is a difference in opinion."  Dr. Angelena Form agrees per telephone note.   Pt reports to PAT nurse that he recently tested positive for COVID on 12/21/2018, therefore does not need a COVID test.  No positive COVID test in Epic.  Negative test results 12/09/2018 and 02/23/2018.  Pt reports he will get his positive test result documentation and bring it DOS.  Will need DOS COVID test unless he can get to the drive through today, per Bridget Hartshorn.   Addendum 2:12 PM: Per Pam at Dr. Ralene Muskrat office pt is on his way to [facility] to be tested.  VS: BP 129/68 (BP Location: Right Arm)   Pulse 76   Temp 36.5 C (Oral)   Resp 18   Ht 5\' 10"  (1.778  m)   Wt 99.6 kg   SpO2 99%   BMI 31.49 kg/m   PROVIDERS: Antony Contras, MD is PCP   Rozann Lesches, MD is Cardiologist  LABS: Labs reviewed: Acceptable for surgery. (all labs ordered are listed, but only abnormal results are displayed)  Labs Reviewed - No data to display   IMAGES:   EKG: 02/28/19 Rate 70 bpm Normal sinus rhythm with sinus arrhythmia Minimal voltage criteria for LVH, may be normal variant ( R in aVL ) Cannot rule out Anterior infarct , age undetermined Abnormal ECG No significant change since last tracing  CV: Cardiac Cath 02/28/19  Mid LAD lesion is 20% stenosed.  1st Sept lesion is 80% stenosed.  2nd Mrg lesion is 20% stenosed.  Hemodynamic findings consistent with aortic valve stenosis.   1. Mild non-obstructive CAD 2. Severe aortic valve stenosis (mean gradient 60.2 mmHg, peak to peak gradient 75 mmHg, AVA 0.55 cm2)  Recommendations: Will continue medical management of mild CAD. Continue with workup for TAVR.   Echo 12/06/2018 IMPRESSIONS   1. Left ventricular ejection fraction, by visual estimation, is 60 to  65%. The left ventricle has normal function. There is mildly increased  left ventricular hypertrophy.  2. The left ventricle has no regional wall motion abnormalities.  3. Definity contrast agent was given IV to delineate the left ventricular  endocardial borders.  4. Left ventricular diastolic parameters are consistent  with Grade I  diastolic dysfunction (impaired relaxation).  5. Global right ventricle has normal systolic function.The right  ventricular size is normal. No increase in right ventricular wall  thickness.  6. Left atrial size was normal.  7. Right atrial size was normal.  8. Mild to moderate mitral annular calcification.  9. The mitral valve is normal in structure. No evidence of mitral valve  regurgitation. No evidence of mitral stenosis.  10. The tricuspid valve is normal in structure. Tricuspid valve   regurgitation is trivial.  11. The aortic valve is tricuspid. Aortic valve regurgitation is not  visualized. Severe aortic valve stenosis. Mean gradient 53 mmHg with AVA  0.57 cm^2.  12. The inferior vena cava is normal in size with greater than 50%  respiratory variability, suggesting right atrial pressure of 3 mmHg.  13. The tricuspid regurgitant velocity is 2.47 m/s, and with an assumed  right atrial pressure of 3 mmHg, the estimated right ventricular systolic  pressure is normal at 27.4 mmHg.  Past Medical History:  Diagnosis Date  . Acute pancreatitis 2017   [facility]  . Aortic valve stenosis   . Arthritis   . CAD (coronary artery disease) 05/07/2011   Mild atherosclerosis by cardiac catheterization February 2021  . GERD (gastroesophageal reflux disease)   . History of anemia   . History of atrial fibrillation 2013   Reportedly limited episode  . History of kidney stones   . History of MRSA infection   . History of pulmonary embolism 2017  . Hypothyroidism   . Pancreatitis 05/2015  . TIA (transient ischemic attack)    November 2020    Past Surgical History:  Procedure Laterality Date  . BACK SURGERY    . CHOLECYSTECTOMY    . COLONOSCOPY  07/08/2011   Procedure: COLONOSCOPY;  Surgeon: Rogene Houston, MD;  [facility];  Service: Endoscopy;  Laterality: N/A;  930  . DUODENOTOMY  09/29/2016   repair of fistula, jejunal resection  . INGUINAL HERNIA REPAIR N/A 03/01/2018   Procedure: LAPAROSCOPIC TEP BILATERAL INGUINAL HERNIA REPAIR;  Surgeon: Johnathan Hausen, MD;  Location: [facility];  Service: General;  Laterality: N/A;  . JEJUNOSTOMY FEEDING TUBE    . KNEE ARTHROSCOPY Left   . LEFT HEART CATHETERIZATION WITH CORONARY ANGIOGRAM N/A 05/07/2011   Procedure: LEFT HEART CATHETERIZATION WITH CORONARY ANGIOGRAM;  Surgeon: Peter M Martinique, MD;  Location: [facility] CATH LAB;  Service: Cardiovascular;  Laterality: N/A;  . PEG TUBE PLACEMENT    . RIGHT/LEFT HEART CATH AND  CORONARY ANGIOGRAPHY N/A 02/28/2019   Procedure: RIGHT/LEFT HEART CATH AND CORONARY ANGIOGRAPHY;  Surgeon: Burnell Blanks, MD;  Location: [facility] CV LAB;  Service: Cardiovascular;  Laterality: N/A;  . VASECTOMY      MEDICATIONS: . amoxicillin (AMOXIL) 500 MG capsule  . aspirin 81 MG chewable tablet  . atorvastatin (LIPITOR) 10 MG tablet  . levothyroxine (SYNTHROID, LEVOTHROID) 112 MCG tablet  . metoprolol tartrate (LOPRESSOR) 50 MG tablet   No current facility-administered medications for this encounter.   Maia Plan [facility] Pre-Surgical Testing [phone] 03/13/19  1:57 PM

## 2019-03-13 NOTE — Progress Notes (Signed)
PCP - Dr. Iline Oven Cardiologist - [place]  Chest x-ray - no EKG - 02/28/19 Stress Test - no ECHO - 12/06/18 Cardiac Cath - 02/28/19  Sleep Study - no CPAP -   Fasting Blood Sugar - no Checks Blood Sugar _____ times a day  Blood Thinner Instructions:ASA Aspirin Instructions:none given to Pt. He decided to stop it    Last Dose:2/15  Anesthesia review:   Patient denies shortness of breath, fever, cough and chest pain at PAT appointment yes  Patient verbalized understanding of instructions that were given to them at the PAT appointment. Patient was also instructed that they will need to review over the PAT instructions again at home before surgery. yes

## 2019-03-14 ENCOUNTER — Encounter (HOSPITAL_COMMUNITY)
Admission: RE | Disposition: A | Discharge status: Home / Self Care | Payer: Self-pay | Source: Home / Self Care | Attending: Urology

## 2019-03-14 ENCOUNTER — Ambulatory Visit (HOSPITAL_COMMUNITY)
Admission: RE | Admit: 2019-03-14 | Discharge: 2019-03-14 | Disposition: A | Discharge status: Home / Self Care | Payer: Medicare Other | Attending: Urology | Admitting: Urology

## 2019-03-14 ENCOUNTER — Ambulatory Visit: Payer: Medicare Other | Attending: Internal Medicine

## 2019-03-14 ENCOUNTER — Other Ambulatory Visit: Payer: Self-pay | Admitting: Urology

## 2019-03-14 DIAGNOSIS — Z5309 Procedure and treatment not carried out because of other contraindication: Secondary | ICD-10-CM | POA: Diagnosis not present

## 2019-03-14 DIAGNOSIS — Z20822 Contact with and (suspected) exposure to covid-19: Secondary | ICD-10-CM

## 2019-03-14 DIAGNOSIS — N201 Calculus of ureter: Secondary | ICD-10-CM | POA: Insufficient documentation

## 2019-03-14 LAB — SARS CORONAVIRUS 2 (TAT 6-24 HRS): SARS Coronavirus 2: POSITIVE — AB

## 2019-03-14 SURGERY — CYSTOURETEROSCOPY, WITH RETROGRADE PYELOGRAM AND STENT INSERTION
Anesthesia: General | Laterality: Left

## 2019-03-14 MED ORDER — CEFAZOLIN SODIUM-DEXTROSE 2-4 GM/100ML-% IV SOLN: 2.0000 g | INTRAVENOUS | Status: DC

## 2019-03-14 NOTE — Addendum Note (Signed)
Addended by: Lynn Ito on: 03/14/2019 09:17 AM   Modules accepted: Orders

## 2019-03-14 NOTE — Telephone Encounter (Signed)
Spoke with Pam at [facility] She has handled this issue

## 2019-03-14 NOTE — Progress Notes (Signed)
Patient covid test on 03/13/19 positive. Resulted during the night and was realized at 03/14/19,0530.  MD Jeffie Pollock was called and notified at 0535 and surgery was canceled. OR desk notified at 0540.   Patient was informed of his positive result and to go home and quarantine.  Informed to call the office of Dr. Jeffie Pollock when they open to discuss rescheduling the procedure. Patient was not happy, states "I have no symptoms, I don't believe the test".   Patient did leave, directed him to the lobby where his daughter was waiting.

## 2019-03-15 NOTE — Progress Notes (Signed)
PCP - Antony Contras Cardiologist - Dr. [name] w/ clearance date 03-08-19  Chest x-ray - 03-10-19  EKG - 02-28-19 Stress Test -  ECHO - 12-06-18 Cardiac Cath -   Sleep Study -  CPAP -   Fasting Blood Sugar -  Checks Blood Sugar _____ times a day  Blood Thinner Instructions: ASA okay to hold, per cardiology Aspirin Instructions: Last Dose:  Anesthesia review: Previously reviewed on 03-13-19  Patient denies shortness of breath, fever, cough and chest pain at PAT appointment   Patient verbalized understanding of instructions that were given to them at the PAT appointment. Patient was also instructed that they will need to review over the PAT instructions again at home before surgery.

## 2019-03-16 ENCOUNTER — Telehealth: Payer: Self-pay | Admitting: Urology

## 2019-03-16 NOTE — Telephone Encounter (Signed)
Patient called and wants a call regarding getting his surgery rescheduled. He states his second covid test was negative.

## 2019-03-17 NOTE — Telephone Encounter (Signed)
Patient cam by the office today and was told per Estill Bamberg and Ledell Noss to call Pam at [facility] to set up surgery details.

## 2019-03-22 NOTE — Telephone Encounter (Signed)
Dr. Cyndia Bent has discussed with Pt and Dr. Jeffie Pollock.  Pt has been instructed to hold ASA 5 days prior as well.

## 2019-03-23 ENCOUNTER — Other Ambulatory Visit: Payer: Self-pay

## 2019-03-23 ENCOUNTER — Encounter (HOSPITAL_COMMUNITY)
Admission: RE | Admit: 2019-03-23 | Discharge: 2019-03-23 | Disposition: A | Discharge status: Home / Self Care | Payer: Medicare Other | Source: Ambulatory Visit | Attending: Urology | Admitting: Urology

## 2019-03-23 DIAGNOSIS — Z01812 Encounter for preprocedural laboratory examination: Secondary | ICD-10-CM | POA: Insufficient documentation

## 2019-03-23 LAB — CBC
HCT: 39.8 % (ref 39.0–52.0)
Hemoglobin: 12.9 g/dL — ABNORMAL LOW (ref 13.0–17.0)
MCH: 29.8 pg (ref 26.0–34.0)
MCHC: 32.4 g/dL (ref 30.0–36.0)
MCV: 91.9 fL (ref 80.0–100.0)
Platelets: 143 10*3/uL — ABNORMAL LOW (ref 150–400)
RBC: 4.33 MIL/uL (ref 4.22–5.81)
RDW: 13.6 % (ref 11.5–15.5)
WBC: 4.9 10*3/uL (ref 4.0–10.5)
nRBC: 0 % (ref 0.0–0.2)

## 2019-03-23 LAB — BASIC METABOLIC PANEL
Anion gap: 8 (ref 5–15)
BUN: 21 mg/dL (ref 8–23)
CO2: 24 mmol/L (ref 22–32)
Calcium: 9.3 mg/dL (ref 8.9–10.3)
Chloride: 108 mmol/L (ref 98–111)
Creatinine, Ser: 1.05 mg/dL (ref 0.61–1.24)
GFR calc Af Amer: >60 mL/min (ref >60)
GFR calc non Af Amer: >60 mL/min (ref >60)
Glucose, Bld: 123 mg/dL — ABNORMAL HIGH (ref 70–99)
Potassium: 4.1 mmol/L (ref 3.5–5.1)
Sodium: 140 mmol/L (ref 135–145)

## 2019-03-24 ENCOUNTER — Encounter (HOSPITAL_COMMUNITY): Payer: Self-pay | Admitting: Urology

## 2019-03-24 ENCOUNTER — Other Ambulatory Visit: Payer: Self-pay

## 2019-03-30 ENCOUNTER — Encounter (HOSPITAL_COMMUNITY)
Admission: RE | Disposition: A | Discharge status: Home / Self Care | Payer: Self-pay | Source: Home / Self Care | Attending: Urology

## 2019-03-30 ENCOUNTER — Ambulatory Visit (HOSPITAL_COMMUNITY): Payer: Medicare Other | Admitting: Certified Registered Nurse Anesthetist

## 2019-03-30 ENCOUNTER — Ambulatory Visit (HOSPITAL_COMMUNITY)
Admission: RE | Admit: 2019-03-30 | Discharge: 2019-03-30 | Disposition: A | Discharge status: Home / Self Care | Payer: Medicare Other | Attending: Urology | Admitting: Urology

## 2019-03-30 ENCOUNTER — Encounter (HOSPITAL_COMMUNITY): Payer: Self-pay | Admitting: Urology

## 2019-03-30 ENCOUNTER — Other Ambulatory Visit: Payer: Self-pay

## 2019-03-30 ENCOUNTER — Ambulatory Visit (HOSPITAL_COMMUNITY): Payer: Medicare Other

## 2019-03-30 DIAGNOSIS — Z8614 Personal history of Methicillin resistant Staphylococcus aureus infection: Secondary | ICD-10-CM | POA: Diagnosis not present

## 2019-03-30 DIAGNOSIS — E039 Hypothyroidism, unspecified: Secondary | ICD-10-CM | POA: Insufficient documentation

## 2019-03-30 DIAGNOSIS — N132 Hydronephrosis with renal and ureteral calculous obstruction: Secondary | ICD-10-CM | POA: Diagnosis not present

## 2019-03-30 DIAGNOSIS — I10 Essential (primary) hypertension: Secondary | ICD-10-CM | POA: Insufficient documentation

## 2019-03-30 DIAGNOSIS — Z9049 Acquired absence of other specified parts of digestive tract: Secondary | ICD-10-CM | POA: Diagnosis not present

## 2019-03-30 DIAGNOSIS — I4891 Unspecified atrial fibrillation: Secondary | ICD-10-CM | POA: Diagnosis not present

## 2019-03-30 DIAGNOSIS — I2699 Other pulmonary embolism without acute cor pulmonale: Secondary | ICD-10-CM | POA: Diagnosis not present

## 2019-03-30 DIAGNOSIS — K219 Gastro-esophageal reflux disease without esophagitis: Secondary | ICD-10-CM | POA: Diagnosis not present

## 2019-03-30 DIAGNOSIS — I2510 Atherosclerotic heart disease of native coronary artery without angina pectoris: Secondary | ICD-10-CM | POA: Diagnosis not present

## 2019-03-30 DIAGNOSIS — I350 Nonrheumatic aortic (valve) stenosis: Secondary | ICD-10-CM | POA: Insufficient documentation

## 2019-03-30 DIAGNOSIS — Z87442 Personal history of urinary calculi: Secondary | ICD-10-CM | POA: Diagnosis not present

## 2019-03-30 DIAGNOSIS — Z8673 Personal history of transient ischemic attack (TIA), and cerebral infarction without residual deficits: Secondary | ICD-10-CM | POA: Diagnosis not present

## 2019-03-30 DIAGNOSIS — N201 Calculus of ureter: Secondary | ICD-10-CM | POA: Diagnosis not present

## 2019-03-30 DIAGNOSIS — I517 Cardiomegaly: Secondary | ICD-10-CM | POA: Insufficient documentation

## 2019-03-30 HISTORY — PX: CYSTOSCOPY WITH RETROGRADE PYELOGRAM, URETEROSCOPY AND STENT PLACEMENT: SHX5789

## 2019-03-30 SURGERY — CYSTOURETEROSCOPY, WITH RETROGRADE PYELOGRAM AND STENT INSERTION
Anesthesia: General | Laterality: Left

## 2019-03-30 MED ORDER — PHENAZOPYRIDINE HCL 200 MG PO TABS: 200.0000 mg | ORAL_TABLET | Freq: Three times a day (TID) | ORAL | 1 refills | Status: DC | PRN

## 2019-03-30 MED ORDER — IOHEXOL 300 MG/ML  SOLN
INTRAMUSCULAR | Status: DC | PRN
  Administered 2019-03-30: 8 mL via URETHRAL

## 2019-03-30 MED ORDER — PROPOFOL 10 MG/ML IV BOLUS
INTRAVENOUS | Status: AC
  Filled 2019-03-30: qty 20

## 2019-03-30 MED ORDER — LACTATED RINGERS IV SOLN: INTRAVENOUS | Status: DC

## 2019-03-30 MED ORDER — PHENYLEPHRINE 40 MCG/ML (10ML) SYRINGE FOR IV PUSH (FOR BLOOD PRESSURE SUPPORT)
PREFILLED_SYRINGE | INTRAVENOUS | Status: DC | PRN
  Administered 2019-03-30: 80 ug via INTRAVENOUS

## 2019-03-30 MED ORDER — ONDANSETRON HCL 4 MG/2ML IJ SOLN
INTRAMUSCULAR | Status: AC
  Filled 2019-03-30: qty 2

## 2019-03-30 MED ORDER — LIDOCAINE 2% (20 MG/ML) 5 ML SYRINGE
INTRAMUSCULAR | Status: DC | PRN
  Administered 2019-03-30: 60 mg via INTRAVENOUS

## 2019-03-30 MED ORDER — FENTANYL CITRATE (PF) 100 MCG/2ML IJ SOLN
INTRAMUSCULAR | Status: DC | PRN
  Administered 2019-03-30 (×4): 25 ug via INTRAVENOUS

## 2019-03-30 MED ORDER — CEFAZOLIN SODIUM-DEXTROSE 2-4 GM/100ML-% IV SOLN
2.0000 g | INTRAVENOUS | Status: AC
  Administered 2019-03-30: 2 g via INTRAVENOUS
  Filled 2019-03-30: qty 100

## 2019-03-30 MED ORDER — SODIUM CHLORIDE 0.9% FLUSH: 3.0000 mL | Freq: Two times a day (BID) | INTRAVENOUS | Status: DC

## 2019-03-30 MED ORDER — FENTANYL CITRATE (PF) 100 MCG/2ML IJ SOLN
INTRAMUSCULAR | Status: AC
  Filled 2019-03-30: qty 2

## 2019-03-30 MED ORDER — DEXAMETHASONE SODIUM PHOSPHATE 10 MG/ML IJ SOLN
INTRAMUSCULAR | Status: DC | PRN
  Administered 2019-03-30: 10 mg via INTRAVENOUS

## 2019-03-30 MED ORDER — DEXAMETHASONE SODIUM PHOSPHATE 10 MG/ML IJ SOLN
INTRAMUSCULAR | Status: AC
  Filled 2019-03-30: qty 1

## 2019-03-30 MED ORDER — LIDOCAINE 2% (20 MG/ML) 5 ML SYRINGE
INTRAMUSCULAR | Status: AC
  Filled 2019-03-30: qty 5

## 2019-03-30 MED ORDER — ONDANSETRON HCL 4 MG/2ML IJ SOLN
INTRAMUSCULAR | Status: DC | PRN
  Administered 2019-03-30: 4 mg via INTRAVENOUS

## 2019-03-30 MED ORDER — HYDROCODONE-ACETAMINOPHEN 5-325 MG PO TABS: 1.0000 | ORAL_TABLET | Freq: Four times a day (QID) | ORAL | 0 refills | Status: DC | PRN

## 2019-03-30 MED ORDER — PHENYLEPHRINE 40 MCG/ML (10ML) SYRINGE FOR IV PUSH (FOR BLOOD PRESSURE SUPPORT)
PREFILLED_SYRINGE | INTRAVENOUS | Status: AC
  Filled 2019-03-30: qty 10

## 2019-03-30 MED ORDER — SODIUM CHLORIDE 0.9 % IR SOLN
Status: DC | PRN
  Administered 2019-03-30: 3000 mL

## 2019-03-30 SURGICAL SUPPLY — 21 items
BAG URO CATCHER STRL LF (MISCELLANEOUS) ×2 IMPLANT
BASKET STONE NCOMPASS (UROLOGICAL SUPPLIES) IMPLANT
CATH URET 5FR 28IN OPEN ENDED (CATHETERS) ×2 IMPLANT
CATH URET DUAL LUMEN 6-10FR 50 (CATHETERS) ×2 IMPLANT
CLOTH BEACON ORANGE TIMEOUT ST (SAFETY) IMPLANT
EXTRACTOR STONE NITINOL NGAGE (UROLOGICAL SUPPLIES) ×2 IMPLANT
FIBER LASER FLEXIVA 1000 (UROLOGICAL SUPPLIES) IMPLANT
FIBER LASER FLEXIVA 365 (UROLOGICAL SUPPLIES) IMPLANT
FIBER LASER FLEXIVA 550 (UROLOGICAL SUPPLIES) IMPLANT
FIBER LASER TRAC TIP (UROLOGICAL SUPPLIES) ×2 IMPLANT
GLOVE SURG SS PI 8.0 STRL IVOR (GLOVE) ×2 IMPLANT
GOWN STRL REUS W/TWL XL LVL3 (GOWN DISPOSABLE) ×2 IMPLANT
GUIDEWIRE ANG ZIPWIRE 038X150 (WIRE) ×2 IMPLANT
GUIDEWIRE STR DUAL SENSOR (WIRE) ×2 IMPLANT
KIT TURNOVER KIT A (KITS) IMPLANT
MANIFOLD NEPTUNE II (INSTRUMENTS) ×2 IMPLANT
PACK CYSTO (CUSTOM PROCEDURE TRAY) ×2 IMPLANT
SHEATH URETERAL 12FRX35CM (MISCELLANEOUS) ×2 IMPLANT
STENT URET 6FRX26 CONTOUR (STENTS) ×2 IMPLANT
TUBING CONNECTING 10 (TUBING) ×2 IMPLANT
TUBING UROLOGY SET (TUBING) ×2 IMPLANT

## 2019-03-30 NOTE — Interval H&P Note (Signed)
History and Physical Interval Note:  He has no new symptoms.    03/30/2019 9:16 AM  Andrew Watson  has presented today for surgery, with the diagnosis of LEFT URETEROPELVIC STONE.  The various methods of treatment have been discussed with the patient and family. After consideration of risks, benefits and other options for treatment, the patient has consented to  Procedure(s): CYSTOSCOPY WITH LEFT  RETROGRADE , URETEROSCOPY HOLMIUM LASER AND STENT PLACEMENT (Left) as a surgical intervention.  The patient's history has been reviewed, patient examined, no change in status, stable for surgery.  I have reviewed the patient's chart and labs.  Questions were answered to the patient's satisfaction.     Irine Seal

## 2019-03-30 NOTE — Discharge Instructions (Signed)

## 2019-03-30 NOTE — Transfer of Care (Signed)
Immediate Anesthesia Transfer of Care Note  Patient: Andrew Watson  Procedure(s) Performed: Procedure(s): CYSTOSCOPY WITH LEFT  RETROGRADE , URETEROSCOPY HOLMIUM LASER AND STENT PLACEMENT (Left)  Patient Location: PACU  Anesthesia Type:General  Level of Consciousness: Alert, Awake, Oriented  Airway & Oxygen Therapy: Patient Spontanous Breathing  Post-op Assessment: Report given to RN  Post vital signs: Reviewed and stable  Last Vitals:  Vitals:   03/30/19 0805  BP: 108/77  Pulse: 78  Resp: 18  Temp: 36.6 C  SpO2: [id number]    Complications: No apparent anesthesia complications

## 2019-03-30 NOTE — Anesthesia Postprocedure Evaluation (Signed)
Anesthesia Post Note  Patient: Andrew Watson  Procedure(s) Performed: CYSTOSCOPY WITH LEFT  RETROGRADE , URETEROSCOPY HOLMIUM LASER AND STENT PLACEMENT (Left )     Patient location during evaluation: PACU Anesthesia Type: General Level of consciousness: awake and alert Pain management: pain level controlled Vital Signs Assessment: post-procedure vital signs reviewed and stable Respiratory status: spontaneous breathing, nonlabored ventilation and respiratory function stable Cardiovascular status: blood pressure returned to baseline and stable Postop Assessment: no apparent nausea or vomiting Anesthetic complications: no    Last Vitals:  Vitals:   03/30/19 1114 03/30/19 1129  BP: 121/80 127/64  Pulse: 71 62  Resp: 15   Temp: 36.5 C   SpO2: 100% 99%    Last Pain:  Vitals:   03/30/19 1129  TempSrc:   PainSc: (P) 0-No pain                 Lynda Rainwater

## 2019-03-30 NOTE — Anesthesia Preprocedure Evaluation (Signed)
Anesthesia Evaluation  Patient identified by MRN, date of birth, ID band Patient awake    Reviewed: Allergy & Precautions, NPO status , Patient's Chart, lab work & pertinent test results, reviewed documented beta blocker date and time   History of Anesthesia Complications Negative for: history of anesthetic complications  Airway Mallampati: II  TM Distance: >3 FB Neck ROM: Full    Dental no notable dental hx.    Pulmonary neg pulmonary ROS,    Pulmonary exam normal breath sounds clear to auscultation       Cardiovascular hypertension, Pt. on home beta blockers + CAD  + Valvular Problems/Murmurs AS and MR  Rhythm:Regular Rate:Normal + Systolic murmurs 1. Mild non-obstructive CAD 2. Severe aortic valve stenosis (mean gradient 60.2 mmHg, peak to peak gradient 75 mmHg, AVA 0.55 cm2)  Recommendations: Will continue medical management of mild CAD. Continue with workup for TAVR.   Echo 12/06/2018 IMPRESSIONS   1. Left ventricular ejection fraction, by visual estimation, is 60 to  65%. The left ventricle has normal function. There is mildly increased  left ventricular hypertrophy.  2. The left ventricle has no regional wall motion abnormalities.  3. Definity contrast agent was given IV to delineate the left ventricular  endocardial borders.  4. Left ventricular diastolic parameters are consistent with Grade I  diastolic dysfunction (impaired relaxation).  5. Global right ventricle has normal systolic function.The right  ventricular size is normal. No increase in right ventricular wall  thickness.  6. Left atrial size was normal.  7. Right atrial size was normal.  8. Mild to moderate mitral annular calcification.  9. The mitral valve is normal in structure. No evidence of mitral valve  regurgitation. No evidence of mitral stenosis.  10. The tricuspid valve is normal in structure. Tricuspid valve  regurgitation is  trivial.  11. The aortic valve is tricuspid. Aortic valve regurgitation is not  visualized. Severe aortic valve stenosis. Mean gradient 53 mmHg with AVA  0.57 cm^2.  12. The inferior vena cava is normal in size with greater than 50%  respiratory variability, suggesting right atrial pressure of 3 mmHg.  13. The tricuspid regurgitant velocity is 2.47 m/s, and with an assumed  right atrial pressure of 3 mmHg, the estimated right ventricular systolic  pressure is normal at 27.4 mmHg.    Neuro/Psych negative neurological ROS  negative psych ROS   GI/Hepatic Neg liver ROS, GERD  Medicated,  Endo/Other  Hypothyroidism   Renal/GU negative Renal ROS  negative genitourinary   Musculoskeletal negative musculoskeletal ROS (+)   Abdominal   Peds negative pediatric ROS (+)  Hematology negative hematology ROS (+)   Anesthesia Other Findings   Reproductive/Obstetrics negative OB ROS                             Anesthesia Physical  Anesthesia Plan  ASA: III  Anesthesia Plan: General   Post-op Pain Management:    Induction: Intravenous  PONV Risk Score and Plan: 2 and Ondansetron, Dexamethasone and Treatment may vary due to age or medical condition  Airway Management Planned: LMA  Additional Equipment:   Intra-op Plan:   Post-operative Plan: Extubation in OR  Informed Consent: I have reviewed the patients History and Physical, chart, labs and discussed the procedure including the risks, benefits and alternatives for the proposed anesthesia with the patient or authorized representative who has indicated his/her understanding and acceptance.     Dental advisory given  Plan  Discussed with: CRNA and Surgeon  Anesthesia Plan Comments:         Anesthesia Quick Evaluation

## 2019-03-30 NOTE — Op Note (Signed)
Procedure: 1.  Cystoscopy with left retrograde pyelogram and interpretation. 2.  Left ureteroscopic stone extraction with holmium laser lithotripsy and insertion of left double-J stent. 3.  Application of fluoroscopy.  Preop diagnosis: 9 x 7 mm left UPJ stone with obstruction.  Postop diagnosis: Same.  Surgeon: Dr. Irine Seal.  Anesthesia: General.  Specimen: Stone fragments.  Drains: 6 French by 26 cm contour double-J stent.  EBL: 3 mL.  Complications: None.  Indications: [name] is a 79 year old male who was found on CT angiography to have a 7 x 9 mm left UPJ stone with obstruction and it was felt this needed to be managed before he underwent a TAVR procedure.  He presents today for ureteroscopic stone management.  Procedure: He was given 2 g of Ancef.  A general anesthetic was induced.  He was placed in lithotomy position and fitted with PAS hose.  His perineum and genitalia were prepped with Betadine solution and he was draped in usual sterile fashion.  Cystoscopy was performed using a 21 [place] scope and 30 degree lens.  Examination revealed a normal urethra.  The external sphincter was intact.  The prostatic urethra was approximately 3 cm in length with bilobar hyperplasia with some  coaptation.  Examination of bladder revealed mild to moderate trabeculation but no mucosal lesions.  Ureteral orifices were in the normal anatomic position.  The left ureteral orifice was cannulated with a 5 [place] open-ended catheter and contrast was instilled.  Left retrograde pyelogram demonstrated a normal ureter up to the level of his UPJ stone which was quite tightly impacted but contrast did flow slightly into the dilated collecting system.  A sensor wire was then passed through the open-ended catheter to the level of the stone and initially would not pass the stone and coiled below it.  I then attempted a Glidewire but had a similar lack of success.  Finally I made 1 more attempt with a  sensor wire and was able to negotiate the wire by the stone into the collecting system.  A 35 cm digital access sheath inner core was then passed over the wire to just below the stone without any resistance.  The assembled sheath was then similarly passed without resistance.  The inner core and wire were then removed.  The dual-lumen digital flexible scope was then passed through the sheath to the level of the stone which was easily identified.  The stone was then engaged with a 200 m tract tip laser fiber with the laser set on 0.5 J and 20 Hz.  On this laser setting the stone was fragmented without difficulty and the fragments were removed as produced using an engage basket.  Once the entire stone had been fragmented and the ureteral fragments were removed, the scope was advanced into the renal collecting system and a few additional small fragments were removed.  Only 1 fragment was visible overlying the renal shadow on fluoroscopy but distant bite multiple efforts I was unable to identify this fragment to retrieve it, but the fragment was small and should pass spontaneously once the stent is removed.  Ureteroscope was then removed and a sensor wire was repassed to the kidney and the access sheath was removed.  The cystoscope was then reinserted over the wire and a 6 [place] by 26 cm contour double-J stent without tether was passed to the kidney under fluoroscopic guidance.  The wire was removed leaving a good coil in the kidney and a good coil in the bladder.  The bladder was then partially drained and the cystoscope was removed.  He was taken down from lithotomy position, his anesthetic was reversed and he was moved to recovery in stable condition.  There were no complications.  He will be sent home with a stone fragments and will return to the office for stent removal in the next several days.

## 2019-03-30 NOTE — Anesthesia Procedure Notes (Signed)
Procedure Name: LMA Insertion Date/Time: 03/30/2019 9:31 AM Performed by: Gerald Leitz, CRNA Pre-anesthesia Checklist: Patient identified, Patient being monitored, Timeout performed, Emergency Drugs available and Suction available Patient Re-evaluated:Patient Re-evaluated prior to induction Oxygen Delivery Method: Circle system utilized Preoxygenation: Pre-oxygenation with 100% oxygen Induction Type: IV induction Ventilation: Mask ventilation without difficulty LMA: LMA inserted LMA Size: 5.0 Tube type: Oral Number of attempts: 1 Placement Confirmation: positive ETCO2 and breath sounds checked- equal and bilateral Tube secured with: Tape Dental Injury: Teeth and Oropharynx as per pre-operative assessment

## 2019-03-31 ENCOUNTER — Telehealth: Payer: Self-pay | Admitting: Cardiovascular Disease

## 2019-03-31 NOTE — Telephone Encounter (Signed)
New message   Patient's daughter wants to schedule valve replacement surgery for patient. Please call to discuss.

## 2019-04-03 NOTE — Telephone Encounter (Signed)
Left message to call back.  The pt is a tentative TAVR for 4/6 with Drs Angelena Form and Cyndia Bent.

## 2019-04-03 NOTE — Telephone Encounter (Signed)
I spoke with the pt and made him aware of tentative TAVR date depending upon clearance from Dr Jeffie Pollock. I will contact the pt once TAVR is scheduled.

## 2019-04-07 ENCOUNTER — Ambulatory Visit (INDEPENDENT_AMBULATORY_CARE_PROVIDER_SITE_OTHER): Payer: Medicare Other | Admitting: Urology

## 2019-04-07 ENCOUNTER — Other Ambulatory Visit (HOSPITAL_COMMUNITY)
Admission: AD | Admit: 2019-04-07 | Discharge: 2019-04-07 | Disposition: A | Discharge status: Home / Self Care | Payer: Medicare Other | Source: Other Acute Inpatient Hospital | Attending: Urology | Admitting: Urology

## 2019-04-07 ENCOUNTER — Other Ambulatory Visit: Payer: Self-pay

## 2019-04-07 VITALS — BP 133/76 | HR 89 | Temp 97.8°F | Ht 69.5 in | Wt 220.0 lb

## 2019-04-07 DIAGNOSIS — R3121 Asymptomatic microscopic hematuria: Secondary | ICD-10-CM | POA: Diagnosis not present

## 2019-04-07 DIAGNOSIS — N200 Calculus of kidney: Secondary | ICD-10-CM

## 2019-04-07 DIAGNOSIS — N50812 Left testicular pain: Secondary | ICD-10-CM | POA: Insufficient documentation

## 2019-04-07 DIAGNOSIS — R3129 Other microscopic hematuria: Secondary | ICD-10-CM | POA: Insufficient documentation

## 2019-04-07 LAB — POCT URINALYSIS DIPSTICK
Bilirubin, UA: NEGATIVE
Glucose, UA: NEGATIVE
Ketones, UA: NEGATIVE
Nitrite, UA: NEGATIVE
Protein, UA: POSITIVE — AB
Spec Grav, UA: 1.015 (ref 1.010–1.025)
Urobilinogen, UA: 0.2 E.U./dL
pH, UA: 6 (ref 5.0–8.0)

## 2019-04-07 LAB — URINALYSIS, ROUTINE W REFLEX MICROSCOPIC
Bilirubin Urine: NEGATIVE
Glucose, UA: NEGATIVE mg/dL
Ketones, ur: NEGATIVE mg/dL
Nitrite: NEGATIVE
Protein, ur: 100 mg/dL — AB
RBC / HPF: >50 RBC/hpf — ABNORMAL HIGH (ref 0–5)
Specific Gravity, Urine: 1.015 (ref 1.005–1.030)
pH: 5 (ref 5.0–8.0)

## 2019-04-07 MED ORDER — CIPROFLOXACIN HCL 500 MG PO TABS
500.0000 mg | ORAL_TABLET | Freq: Once | ORAL | Status: AC
  Administered 2019-04-07: 500 mg via ORAL

## 2019-04-07 NOTE — Progress Notes (Signed)
Subjective: 1. Calculus of kidney   2. Asymptomatic microscopic hematuria   3. Testicular pain, left    Andrew Watson returns today in f/u from left ureteroscopic stone extraction with laser and stent for stent removal. His stones will be sent for analysis.  He has some stent pain.      GU Hx: Andrew Watson is sent back in consultation by his cardiologist for a 7 x 13 mm left UPJ stone with obstruction  found on a CT earlier this month during an admission for aortic stenosis.  He was last seen by me on 02/04/18 for a history of hematuria and had a 59mm LLP stone on imaging at that time.  He has no pain or hematuria.   KUB today confirms the stone in the area of the left UPJ.    He has been seen by Dr. Cyndia Bent and will be scheduled for a transcatheter aortic valve replacement after definitive stone management.   He has been cleared by cardiology to proceed with stone management.  ROS:  Review of Systems  All other systems reviewed and are negative.   No Known Allergies  Past Medical History:  Diagnosis Date  . Acute pancreatitis 2017   [facility]  . Aortic valve stenosis   . Arthritis   . CAD (coronary artery disease) 05/07/2011   Mild atherosclerosis by cardiac catheterization February 2021  . GERD (gastroesophageal reflux disease)   . History of anemia   . History of atrial fibrillation 2013   Reportedly limited episode  . History of kidney stones   . History of MRSA infection   . History of pulmonary embolism 2017  . Hypothyroidism   . Pancreatitis 05/2015  . TIA (transient ischemic attack)    November 2020    Past Surgical History:  Procedure Laterality Date  . BACK SURGERY    . CHOLECYSTECTOMY    . COLONOSCOPY  07/08/2011   Procedure: COLONOSCOPY;  Surgeon: Rogene Houston, MD;  Location[facility];  Service: Endoscopy;  Laterality: N/A;  930  . CYSTOSCOPY WITH RETROGRADE PYELOGRAM, URETEROSCOPY AND STENT PLACEMENT Left 03/30/2019   Procedure: CYSTOSCOPY WITH LEFT  RETROGRADE ,  URETEROSCOPY HOLMIUM LASER AND STENT PLACEMENT;  Surgeon: Irine Seal, MD;  Location: [facility] ORS;  Service: Urology;  Laterality: Left;  . DUODENOTOMY  09/29/2016   repair of fistula, jejunal resection  . INGUINAL HERNIA REPAIR N/A 03/01/2018   Procedure: LAPAROSCOPIC TEP BILATERAL INGUINAL HERNIA REPAIR;  Surgeon: Johnathan Hausen, MD;  Location: [facility] ORS;  Service: General;  Laterality: N/A;  . JEJUNOSTOMY FEEDING TUBE    . KNEE ARTHROSCOPY Left   . LEFT HEART CATHETERIZATION WITH CORONARY ANGIOGRAM N/A 05/07/2011   Procedure: LEFT HEART CATHETERIZATION WITH CORONARY ANGIOGRAM;  Surgeon: Peter M Martinique, MD;  Location: [facility] CATH LAB;  Service: Cardiovascular;  Laterality: N/A;  . PEG TUBE PLACEMENT    . RIGHT/LEFT HEART CATH AND CORONARY ANGIOGRAPHY N/A 02/28/2019   Procedure: RIGHT/LEFT HEART CATH AND CORONARY ANGIOGRAPHY;  Surgeon: Burnell Blanks, MD;  Location: [facility] CV LAB;  Service: Cardiovascular;  Laterality: N/A;  . VASECTOMY      Social History   Socioeconomic History  . Marital status: Widowed    Spouse name: [name]  . Number of children: Not on file  . Years of education: Not on file  . Highest education level: High school graduate  Occupational History    Comment: retired   Tobacco Use  . Smoking status: Never Smoker  . Smokeless tobacco:  Never Used  Substance and Sexual Activity  . Alcohol use: No    Comment: none since Jun 26, 2015  . Drug use: No  . Sexual activity: Not on file  Other Topics Concern  . Not on file  Social History Narrative   Lives with wife   Caffeine- coffee 2 c daily, usually decaf   Social Determinants of Health   Financial Resource Strain:   . Difficulty of Paying Living Expenses:   Food Insecurity:   . Worried About Charity fundraiser in the Last Year:   . Arboriculturist in the Last Year:   Transportation Needs:   . Film/video editor (Medical):   [name] Kitchen Lack of Transportation (Non-Medical):   Physical Activity:   . Days of  Exercise per Week:   . Minutes of Exercise per Session:   Stress:   . Feeling of Stress :   Social Connections:   . Frequency of Communication with Friends and Family:   . Frequency of Social Gatherings with Friends and Family:   . Attends Religious Services:   . Active Member of Clubs or Organizations:   . Attends Archivist Meetings:   [name] Kitchen Marital Status:   Intimate Partner Violence:   . Fear of Current or Ex-Partner:   . Emotionally Abused:   [name] Kitchen Physically Abused:   . Sexually Abused:     Family History  Problem Relation Age of Onset  . Arthritis Mother   . Other Brother        staph infection    Anti-infectives: Anti-infectives (From admission, onward)   Start     Dose/Rate Route Frequency Ordered Stop   04/07/19 1330  CIPROFLOXACIN HCL 500 MG PO TABS     500 mg Oral  Once 04/07/19 1318 04/07/19 1401      Current Outpatient Medications  Medication Sig Dispense Refill  . amoxicillin (AMOXIL) 500 MG capsule Take 2,000 mg by mouth See admin instructions. Take 4 capsules (2000 mg) 1 hour prior dental procedures.    [name] Kitchen aspirin 81 MG chewable tablet Chew 1 tablet (81 mg total) by mouth daily. 30 tablet 0  . atorvastatin (LIPITOR) 10 MG tablet Take 1 tablet (10 mg total) by mouth daily. 90 tablet 2  . HYDROcodone-acetaminophen (NORCO/VICODIN) 5-325 MG tablet Take 1 tablet by mouth every 6 (six) hours as needed for moderate pain. 12 tablet 0  . levothyroxine (SYNTHROID, LEVOTHROID) 112 MCG tablet Take 224 mcg by mouth daily before breakfast.     . metoprolol tartrate (LOPRESSOR) 50 MG tablet Take one tablet by mouth at 8:30 AM on 03/06/2019 1 tablet 0  . phenazopyridine (PYRIDIUM) 200 MG tablet Take 1 tablet (200 mg total) by mouth 3 (three) times daily as needed for pain (for burning with urination.). 10 tablet 1   No current facility-administered medications for this visit.     Objective: BP 133/76   Pulse 89   Temp 97.8 F (36.6 C)   Ht 5' 9.5" (1.765 m)   Wt  220 lb (99.8 kg)   BMI 32.02 kg/m     Physical Exam Vitals reviewed.  Constitutional:      Appearance: Normal appearance.  Pulmonary:     Comments: Decreased BS in right base.  Neurological:     Mental Status: He is alert.     Lab Results:  Recent Results (from the past 2160 hour(s))  SARS CORONAVIRUS 2 (TAT 6-24 HRS) Nasopharyngeal Nasopharyngeal Swab     Status:  None   Collection Time: 02/24/19  7:14 AM   Specimen: Nasopharyngeal Swab  Result Value Ref Range   SARS Coronavirus 2 NEGATIVE NEGATIVE    Comment: (NOTE) SARS-CoV-2 target nucleic acids are NOT DETECTED. The SARS-CoV-2 RNA is generally detectable in upper and lower respiratory specimens during the acute phase of infection. Negative results do not preclude SARS-CoV-2 infection, do not rule out co-infections with other pathogens, and should not be used as the sole basis for treatment or other patient management decisions. Negative results must be combined with clinical observations, patient history, and epidemiological information. The expected result is Negative. Fact Sheet for Patients: SugarRoll.be Fact Sheet for Healthcare Providers: https://www.woods-mathews.com/ This test is not yet approved or cleared by the [place] FDA and  has been authorized for detection and/or diagnosis of SARS-CoV-2 by FDA under an Emergency Use Authorization (EUA). This EUA will remain  in effect (meaning this test can be used) for the duration of the COVID-19 declaration under Section 56 4(b)(1) of the Act, 21 U.S.C. section 360bbb-3(b)(1), unless the authorization is terminated or revoked sooner. Performed at [facility], [place] [address] [place], [place] [id number]   Basic metabolic panel     Status: Abnormal   Collection Time: 02/24/19  1:59 PM  Result Value Ref Range   Glucose 114 (H) 65 - 99 mg/dL   BUN 21 8 - 27 mg/dL   Creatinine, Ser 1.18 0.76 - 1.27 mg/dL   GFR  calc non Af Amer 58 (L) >59 mL/min/1.73   GFR calc Af Amer 67 >59 mL/min/1.73   BUN/Creatinine Ratio 18 10 - 24   Sodium 143 134 - 144 mmol/L   Potassium 4.5 3.5 - 5.2 mmol/L   Chloride 104 96 - 106 mmol/L   CO2 26 20 - 29 mmol/L   Calcium 9.6 8.6 - 10.2 mg/dL  CBC     Status: Abnormal   Collection Time: 02/24/19  1:59 PM  Result Value Ref Range   WBC 5.5 3.4 - 10.8 x10E3/uL   RBC 4.33 4.14 - 5.80 x10E6/uL   Hemoglobin 13.0 13.0 - 17.7 g/dL   Hematocrit 39.0 37.5 - 51.0 %   MCV 90 79 - 97 fL   MCH 30.0 26.6 - 33.0 pg   MCHC 33.3 31.5 - 35.7 g/dL   RDW 13.1 11.6 - 15.4 %   Platelets 149 (L) 150 - 450 x10E3/uL  POCT I-Stat EG7     Status: Abnormal   Collection Time: 02/28/19  1:01 PM  Result Value Ref Range   pH, Ven 7.388 7.250 - 7.430   pCO2, Ven 43.7 (L) 44.0 - 60.0 mmHg   pO2, Ven 36.0 32.0 - 45.0 mmHg   Bicarbonate 26.3 20.0 - 28.0 mmol/L   TCO2 28 22 - 32 mmol/L   O2 Saturation 67.0 %   Acid-Base Excess 1.0 0.0 - 2.0 mmol/L   Sodium 142 135 - 145 mmol/L   Potassium 4.3 3.5 - 5.1 mmol/L   Calcium, Ion 1.32 1.15 - 1.40 mmol/L   HCT 35.0 (L) 39.0 - 52.0 %   Hemoglobin 11.9 (L) 13.0 - 17.0 g/dL   Patient temperature HIDE    Sample type VENOUS    Comment NOTIFIED PHYSICIAN   I-STAT 7, (LYTES, BLD GAS, ICA, H+H)     Status: Abnormal   Collection Time: 02/28/19  1:01 PM  Result Value Ref Range   pH, Arterial 7.437 7.350 - 7.450   pCO2 arterial 36.2 32.0 - 48.0 mmHg  pO2, Arterial 135.0 (H) 83.0 - 108.0 mmHg   Bicarbonate 24.4 20.0 - 28.0 mmol/L   TCO2 25 22 - 32 mmol/L   O2 Saturation 99.0 %   Sodium 143 135 - 145 mmol/L   Potassium 4.1 3.5 - 5.1 mmol/L   Calcium, Ion 1.19 1.15 - 1.40 mmol/L   HCT 33.0 (L) 39.0 - 52.0 %   Hemoglobin 11.2 (L) 13.0 - 17.0 g/dL   Patient temperature HIDE    Sample type ARTERIAL   POCT urinalysis dipstick     Status: None   Collection Time: 03/10/19 11:33 AM  Result Value Ref Range   Color, UA yellow    Clarity, UA     Glucose, UA  Negative Negative   Bilirubin, UA neg    Ketones, UA neg    Spec Grav, UA 1.010 1.010 - 1.025   Blood, UA hemo trace    pH, UA 5.0 5.0 - 8.0   Protein, UA Negative Negative   Urobilinogen, UA 0.2 0.2 or 1.0 E.U./dL   Nitrite, UA neg    Leukocytes, UA Negative Negative   Appearance clear    Odor    Surgical pcr screen     Status: None   Collection Time: 03/13/19 10:13 AM   Specimen: Nasal Mucosa; Nasal Swab  Result Value Ref Range   MRSA, PCR NEGATIVE NEGATIVE   Staphylococcus aureus NEGATIVE NEGATIVE    Comment: (NOTE) The Xpert SA Assay (FDA approved for NASAL specimens in patients 73 years of age and older), is one component of a comprehensive surveillance program. It is not intended to diagnose infection nor to guide or monitor treatment. Performed at [facility], [place] [address], [place], [place] [id number]   Basic metabolic panel     Status: Abnormal   Collection Time: 03/13/19 10:15 AM  Result Value Ref Range   Sodium 141 135 - 145 mmol/L   Potassium 4.6 3.5 - 5.1 mmol/L   Chloride 106 98 - 111 mmol/L   CO2 27 22 - 32 mmol/L   Glucose, Bld 108 (H) 70 - 99 mg/dL   BUN 23 8 - 23 mg/dL   Creatinine, Ser 0.99 0.61 - 1.24 mg/dL   Calcium 9.7 8.9 - 10.3 mg/dL   GFR calc non Af Amer >60 >60 mL/min   GFR calc Af Amer >60 >60 mL/min   Anion gap 8 5 - 15    Comment: Performed at [facility], [place] [address], [place], [place] [address]  CBC     Status: None   Collection Time: 03/13/19 10:15 AM  Result Value Ref Range   WBC 5.2 4.0 - 10.5 K/uL   RBC 4.34 4.22 - 5.81 MIL/uL   Hemoglobin 13.1 13.0 - 17.0 g/dL   HCT 40.5 39.0 - 52.0 %   MCV 93.3 80.0 - 100.0 fL   MCH 30.2 26.0 - 34.0 pg   MCHC 32.3 30.0 - 36.0 g/dL   RDW 14.0 11.5 - 15.5 %   Platelets 150 150 - 400 K/uL   nRBC 0.0 0.0 - 0.2 %    Comment: Performed at [facility], [place] [address] [place], [place] [address]  SARS CORONAVIRUS 2 (TAT 6-24 HRS)  Nasopharyngeal Nasopharyngeal Swab     Status: Abnormal   Collection Time: 03/13/19  4:10 PM   Specimen: Nasopharyngeal Swab  Result Value Ref Range   SARS Coronavirus 2 POSITIVE (A) NEGATIVE    Comment: (NOTE) SARS-CoV-2 target nucleic acids are  DETECTED. The SARS-CoV-2 RNA is generally detectable in upper and lower respiratory specimens during the acute phase of infection. Positive results are indicative of the presence of SARS-CoV-2 RNA. Clinical correlation with patient history and other diagnostic information is  necessary to determine patient infection status. Positive results do not rule out bacterial infection or co-infection with other viruses.  The expected result is Negative. Fact Sheet for Patients: SugarRoll.be Fact Sheet for Healthcare Providers: https://www.woods-mathews.com/ This test is not yet approved or cleared by the [place] FDA and  has been authorized for detection and/or diagnosis of SARS-CoV-2 by FDA under an Emergency Use Authorization (EUA). This EUA will remain  in effect (meaning this test can be used) for the duration of the COVID-19 declaration under Section 564(b)(1) of the Act, 21 U.S.C. se ction 360bbb-3(b)(1), unless the authorization is terminated or revoked sooner. Performed at [facility], [place] [address] [place], [place] [id number]   Basic metabolic panel     Status: Abnormal   Collection Time: 03/23/19 10:04 AM  Result Value Ref Range   Sodium 140 135 - 145 mmol/L   Potassium 4.1 3.5 - 5.1 mmol/L   Chloride 108 98 - 111 mmol/L   CO2 24 22 - 32 mmol/L   Glucose, Bld 123 (H) 70 - 99 mg/dL    Comment: Glucose reference range applies only to samples taken after fasting for at least 8 hours.   BUN 21 8 - 23 mg/dL   Creatinine, Ser 1.05 0.61 - 1.24 mg/dL   Calcium 9.3 8.9 - 10.3 mg/dL   GFR calc non Af Amer >60 >60 mL/min   GFR calc Af Amer >60 >60 mL/min   Anion gap 8 5 - 15    Comment:  Performed at [facility], [place] [address] [place], [place] [address]  CBC     Status: Abnormal   Collection Time: 03/23/19 10:04 AM  Result Value Ref Range   WBC 4.9 4.0 - 10.5 K/uL   RBC 4.33 4.22 - 5.81 MIL/uL   Hemoglobin 12.9 (L) 13.0 - 17.0 g/dL   HCT 39.8 39.0 - 52.0 %   MCV 91.9 80.0 - 100.0 fL   MCH 29.8 26.0 - 34.0 pg   MCHC 32.4 30.0 - 36.0 g/dL   RDW 13.6 11.5 - 15.5 %   Platelets 143 (L) 150 - 400 K/uL   nRBC 0.0 0.0 - 0.2 %    Comment: Performed at [facility], [place] [address] [place],  [address]  POCT urinalysis dipstick     Status: Abnormal   Collection Time: 04/07/19  1:19 PM  Result Value Ref Range   Color, UA yellow    Clarity, UA     Glucose, UA Negative Negative   Bilirubin, UA neg    Ketones, UA neg    Spec Grav, UA 1.015 1.010 - 1.025   Blood, UA +++    pH, UA 6.0 5.0 - 8.0   Protein, UA Positive (A) Negative    Comment: +++/500   Urobilinogen, UA 0.2 0.2 or 1.0 E.U./dL   Nitrite, UA neg    Leukocytes, UA Small (1+) (A) Negative   Appearance hazy    Odor       BMET No results for input(s): NA, K, CL, CO2, GLUCOSE, BUN, CREATININE, CALCIUM in the last 72 hours. PT/INR No results for input(s): LABPROT, INR in the last 72 hours. ABG No results for input(s): PHART, HCO3 in the last 72 hours.  Invalid input(s): PCO2, PO2  Studies/Results: IMPRESSION: 1. Vascular findings and measurements pertinent to potential TAVR procedure, as detailed. 2. Diffuse thickening and coarse calcification of the aortic valve, compatible with the reported history of severe symptomatic aortic stenosis. 3. Obstructing 7 mm left UPJ stone with mild-to-moderate left hydronephrosis. 4. Small basilar right pleural effusion with smooth right pleural thickening, suggesting chronic loculated right pleural effusion. 5. Findings at the right lung base most compatible with rounded atelectasis. Follow-up chest CT advised in 3 months  to document stability of this finding. 6. Mild cardiomegaly. Coronary atherosclerosis. 7. Nonspecific mild diffuse bladder wall thickening, probably due to chronic bladder outlet obstruction by the mildly enlarged prostate. 8. Right thyroid 1.5 cm nodule. Thyroid ultrasound correlation  KUB 03/10/19  The LUPJ stone is visible on the KUB from today.   It measures 16 x 15mm.  indicated if not previously performed.(Ref: J Am Coll Radiol. 2015 Feb;12(2): 143-50). 9. Aortic Atherosclerosis (ICD10-I70.0).  These results will be called to the ordering clinician or representative by the Radiologist Assistant, and communication documented in the PACS or zVision Dashboard.   Electronically Signed   By: Ilona Sorrel M.D.   On: 03/06/2019 13:04  Procedure:   He was prepped with betadine and the urethra was instilled with lidocaine.  He was given Cipro 500mg .  The flexible cystoscope was passed and the left ureteral stent was grasped and removed.  No new findings were identified.    Assessment/Plan: 1. 16 x 43mm left UPJ stone with obstruction.  He is doing well sp left ureteroscopy and his stent was removed today.  He should be able to procedure with the planned cardiac procedure.  I will have him return in 4-6 weeks with a KUB and renal US.  UA and culture sent today.   Meds ordered this encounter  Medications  . ciprofloxacin (CIPRO) tablet 500 mg     Orders Placed This Encounter  Procedures  . US RENAL    Standing Status:   Future    Standing Expiration Date:   04/06/2020    Order Specific Question:   Reason for Exam (SYMPTOM  OR DIAGNOSIS REQUIRED)    Answer:   nephrolithiasis    Order Specific Question:   Preferred imaging location?    Answer:   [facility]    Standing Status:   Future    Standing Expiration Date:   04/06/2020    Order Specific Question:   Reason for Exam (SYMPTOM  OR DIAGNOSIS REQUIRED)    Answer:   Nephrolithiasis    Order Specific  Question:   Preferred imaging location?    Answer:   [facility]    Order Specific Question:   Radiology Contrast Protocol - do NOT remove file path    Answer:   [email or web address]  . Calculi, with Photograph  . POCT urinalysis dipstick     Return in about 6 weeks (around 05/19/2019).    CC: Dr. Antony Contras, Dr. Arvid Right and Dr. Lauree Chandler.      Irine Seal 04/07/2019 [phone]

## 2019-04-10 LAB — URINE CULTURE: Culture: 80000 — AB

## 2019-04-14 ENCOUNTER — Other Ambulatory Visit: Payer: Self-pay

## 2019-04-14 DIAGNOSIS — I350 Nonrheumatic aortic (valve) stenosis: Secondary | ICD-10-CM

## 2019-04-15 LAB — CALCULI, WITH PHOTOGRAPH (CLINICAL LAB)
Calcium Oxalate Monohydrate: 100 %
Weight Calculi: 121 mg

## 2019-04-27 NOTE — Pre-Procedure Instructions (Signed)
Andrew Watson  04/27/2019      [place], [place] [id number]  [place] [id number] Phone: [phone] Fax: [phone]    Your procedure is scheduled on April 6  Report to [facility] entrance A at 5:30 A.M.  Call this number if you have problems the morning of surgery:  [phone]   Remember:  Do not eat or drink after midnight.      Take these medicines the morning of surgery with A SIP OF WATER : none               7 days prior to surgery STOP taking Aleve, Naproxen, Ibuprofen, Motrin, Advil, Goody's, BC's, all herbal medications, fish oil, and all vitamins.    Do not wear jewelry.  Do not wear lotions, powders, or perfumes, or deodorant.  Do not shave 48 hours prior to surgery.  Men may shave face and neck.  Do not bring valuables to the hospital.  [facility] is not responsible for any belongings or valuables.  Contacts, dentures or bridgework may not be worn into surgery.  Leave your suitcase in the car.  After surgery it may be brought to your room.  For patients admitted to the hospital, discharge time will be determined by your treatment team.  Patients discharged the day of surgery will not be allowed to drive home.    Special instructions:  [facility]- Preparing For Surgery  Before surgery, you can play an important role. Because skin is not sterile, your skin needs to be as free of germs as possible. You can reduce the number of germs on your skin by washing with CHG (chlorahexidine gluconate) Soap before surgery.  CHG is an antiseptic cleaner which kills germs and bonds with the skin to continue killing germs even after washing.    Oral Hygiene is also important to reduce your risk of infection.  Remember - BRUSH YOUR TEETH THE MORNING OF SURGERY WITH YOUR REGULAR TOOTHPASTE  Please do not use if you have an allergy to CHG or antibacterial soaps. If your skin becomes reddened/irritated stop using  the CHG.  Do not shave (including legs and underarms) for at least 48 hours prior to first CHG shower. It is OK to shave your face.  Please follow these instructions carefully.   1. Shower the NIGHT BEFORE SURGERY and the MORNING OF SURGERY with CHG.   2. If you chose to wash your hair, wash your hair first as usual with your normal shampoo.  3. After you shampoo, rinse your hair and body thoroughly to remove the shampoo.  4. Use CHG as you would any other liquid soap. You can apply CHG directly to the skin and wash gently with a scrungie or a clean washcloth.   5. Apply the CHG Soap to your body ONLY FROM THE NECK DOWN.  Do not use on open wounds or open sores. Avoid contact with your eyes, ears, mouth and genitals (private parts). Wash Face and genitals (private parts)  with your normal soap.  6. Wash thoroughly, paying special attention to the area where your surgery will be performed.  7. Thoroughly rinse your body with warm water from the neck down.  8. DO NOT shower/wash with your normal soap after using and rinsing off the CHG Soap.  9. Pat yourself dry with a CLEAN TOWEL.  10. Wear CLEAN PAJAMAS to bed the night before surgery, wear comfortable clothes the morning of  surgery  11. Place CLEAN SHEETS on your bed the night of your first shower and DO NOT SLEEP WITH PETS.    Day of Surgery:  Do not apply any deodorants/lotions.  Please wear clean clothes to the hospital/surgery center.   Remember to brush your teeth WITH YOUR REGULAR TOOTHPASTE.    Please read over the following fact sheets that you were given. Coughing and Deep Breathing, MRSA Information and Surgical Site Infection Prevention

## 2019-04-28 ENCOUNTER — Encounter (HOSPITAL_COMMUNITY)
Admission: RE | Admit: 2019-04-28 | Discharge: 2019-04-28 | Disposition: A | Discharge status: Home / Self Care | Payer: Medicare Other | Source: Ambulatory Visit | Attending: Cardiovascular Disease | Admitting: Cardiovascular Disease

## 2019-04-28 ENCOUNTER — Encounter (HOSPITAL_COMMUNITY): Payer: Self-pay

## 2019-04-28 ENCOUNTER — Ambulatory Visit (HOSPITAL_COMMUNITY)
Admission: RE | Admit: 2019-04-28 | Discharge: 2019-04-28 | Disposition: A | Discharge status: Home / Self Care | Payer: Medicare Other | Source: Ambulatory Visit | Attending: Cardiovascular Disease | Admitting: Cardiovascular Disease

## 2019-04-28 ENCOUNTER — Other Ambulatory Visit: Payer: Self-pay

## 2019-04-28 DIAGNOSIS — J90 Pleural effusion, not elsewhere classified: Secondary | ICD-10-CM | POA: Diagnosis not present

## 2019-04-28 DIAGNOSIS — I350 Nonrheumatic aortic (valve) stenosis: Secondary | ICD-10-CM

## 2019-04-28 DIAGNOSIS — Z01818 Encounter for other preprocedural examination: Secondary | ICD-10-CM | POA: Insufficient documentation

## 2019-04-28 DIAGNOSIS — R918 Other nonspecific abnormal finding of lung field: Secondary | ICD-10-CM | POA: Diagnosis not present

## 2019-04-28 LAB — COMPREHENSIVE METABOLIC PANEL
ALT: 22 U/L (ref 0–44)
AST: 21 U/L (ref 15–41)
Albumin: 3.7 g/dL (ref 3.5–5.0)
Alkaline Phosphatase: 42 U/L (ref 38–126)
Anion gap: 8 (ref 5–15)
BUN: 18 mg/dL (ref 8–23)
CO2: 23 mmol/L (ref 22–32)
Calcium: 9.3 mg/dL (ref 8.9–10.3)
Chloride: 108 mmol/L (ref 98–111)
Creatinine, Ser: 0.91 mg/dL (ref 0.61–1.24)
GFR calc Af Amer: >60 mL/min (ref >60)
GFR calc non Af Amer: >60 mL/min (ref >60)
Glucose, Bld: 94 mg/dL (ref 70–99)
Potassium: 4.6 mmol/L (ref 3.5–5.1)
Sodium: 139 mmol/L (ref 135–145)
Total Bilirubin: 1.1 mg/dL (ref 0.3–1.2)
Total Protein: 6.7 g/dL (ref 6.5–8.1)

## 2019-04-28 LAB — CBC
HCT: 38.2 % — ABNORMAL LOW (ref 39.0–52.0)
Hemoglobin: 12.5 g/dL — ABNORMAL LOW (ref 13.0–17.0)
MCH: 29.9 pg (ref 26.0–34.0)
MCHC: 32.7 g/dL (ref 30.0–36.0)
MCV: 91.4 fL (ref 80.0–100.0)
Platelets: 126 10*3/uL — ABNORMAL LOW (ref 150–400)
RBC: 4.18 MIL/uL — ABNORMAL LOW (ref 4.22–5.81)
RDW: 12.9 % (ref 11.5–15.5)
WBC: 4.1 10*3/uL (ref 4.0–10.5)
nRBC: 0 % (ref 0.0–0.2)

## 2019-04-28 LAB — BLOOD GAS, ARTERIAL
Acid-Base Excess: 0.1 mmol/L (ref 0.0–2.0)
Bicarbonate: 24.4 mmol/L (ref 20.0–28.0)
FIO2: 21
O2 Saturation: 97.2 %
Patient temperature: 37
pCO2 arterial: 40.6 mmHg (ref 32.0–48.0)
pH, Arterial: 7.396 (ref 7.350–7.450)
pO2, Arterial: 93.1 mmHg (ref 83.0–108.0)

## 2019-04-28 LAB — PROTIME-INR
INR: 1 (ref 0.8–1.2)
Prothrombin Time: 13.6 seconds (ref 11.4–15.2)

## 2019-04-28 LAB — TYPE AND SCREEN
ABO/RH(D): B NEG
Antibody Screen: NEGATIVE

## 2019-04-28 LAB — BRAIN NATRIURETIC PEPTIDE: B Natriuretic Peptide: 213.3 pg/mL — ABNORMAL HIGH (ref 0.0–100.0)

## 2019-04-28 LAB — HEMOGLOBIN A1C
Hgb A1c MFr Bld: 5.2 % (ref 4.8–5.6)
Mean Plasma Glucose: 102.54 mg/dL

## 2019-04-28 LAB — APTT: aPTT: 42 seconds — ABNORMAL HIGH (ref 24–36)

## 2019-04-28 LAB — SURGICAL PCR SCREEN
MRSA, PCR: NEGATIVE
Staphylococcus aureus: NEGATIVE

## 2019-04-28 LAB — ABO/RH: ABO/RH(D): B NEG

## 2019-04-28 NOTE — Progress Notes (Signed)
PCP - Antony Contras @ [facility]   Chest x-ray - 04/28/19 EKG - 04/28/19 Stress Test -  ECHO - 11/20 Cardiac Cath - 2/21  Sleep Study - na   Aspirin Instructions: to continue per orders  Pt. To contact Spry , navigator for [facility]) pt. States he is to take medications on day of surgery,although orders state not to. I left message with Lauren to clarify and contact pt.   COVID TEST- Pt. Was positive on 03/13/19. Does not need to be retested.   Anesthesia review:   Patient denies shortness of breath, fever, cough and chest pain at PAT appointment   All instructions explained to the patient, with a verbal understanding of the material. Patient agrees to go over the instructions while at home for a better understanding. Patient also instructed to self quarantine after being tested for COVID-19. The opportunity to ask questions was provided.

## 2019-05-01 MED ORDER — SODIUM CHLORIDE 0.9 % IV SOLN
INTRAVENOUS | Status: DC
  Filled 2019-05-01: qty 30

## 2019-05-01 MED ORDER — VANCOMYCIN HCL 1500 MG/300ML IV SOLN
1500.0000 mg | INTRAVENOUS | Status: AC
  Administered 2019-05-02: 08:00:00 1500 mg via INTRAVENOUS
  Filled 2019-05-01: qty 300

## 2019-05-01 MED ORDER — POTASSIUM CHLORIDE 2 MEQ/ML IV SOLN
80.0000 meq | INTRAVENOUS | Status: DC
  Filled 2019-05-01: qty 40

## 2019-05-01 MED ORDER — DEXMEDETOMIDINE HCL IN NACL 400 MCG/100ML IV SOLN
0.1000 ug/kg/h | INTRAVENOUS | Status: AC
  Administered 2019-05-02: .7 ug/kg/h via INTRAVENOUS
  Filled 2019-05-01: qty 100

## 2019-05-01 MED ORDER — SODIUM CHLORIDE 0.9 % IV SOLN
1.5000 g | INTRAVENOUS | Status: AC
  Administered 2019-05-02: 1.5 g via INTRAVENOUS
  Filled 2019-05-01: qty 1.5

## 2019-05-01 MED ORDER — MAGNESIUM SULFATE 50 % IJ SOLN
40.0000 meq | INTRAMUSCULAR | Status: DC
  Filled 2019-05-01: qty 9.85

## 2019-05-01 MED ORDER — NOREPINEPHRINE 4 MG/250ML-% IV SOLN
0.0000 ug/min | INTRAVENOUS | Status: AC
  Administered 2019-05-02: 4 ug/min via INTRAVENOUS
  Filled 2019-05-01: qty 250

## 2019-05-01 NOTE — H&P (Signed)
[address]       [place] [address]             [phone]      Cardiothoracic Surgery Admission History and Physical   Referring Provider is Satira Sark, MD  Primary Cardiologist is No primary care provider on file.  PCP is Antony Contras, MD  Urologist: Dr. Irine Seal     Chief Complaint  Patient presents with  . Aortic Stenosis       HPI:  The patient is a 79 year old gentleman with a history of mild coronary artery disease, remote atrial fibrillation, hypothyroidism, pulmonary embolism, TIA in November 2020, COVID-19 in November 2020 treated with bamlanivimab, and aortic stenosis. He had an echo in April 2013 which showed a mildly calcified aortic valve with a mean gradient of 15 mmHg. He had another echocardiogram in March 2019 at [facility] when he underwent preoperative cardiology evaluation before hip replacement. This reportedly showed moderate aortic stenosis and he underwent hip replacement without difficulty. He had a repeat echocardiogram on 12/06/2018 which showed progression to severe aortic stenosis with a mean gradient of 53 mmHg and peak gradient of 77 mmHg. Aortic valve area was 0.57 cm with a dimensionless index of 0.18. The aortic valve was severely thickened and calcified with restricted mobility of the leaflets.   He is widowed and has a significant other. He reports progressive exertional dyspnea and fatigue which is worsened over the past couple months. He has had no chest pain or pressure. He denies dizziness and syncope. He said no orthopnea. He does report some mild swelling in his ankles.  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       Past Medical History:  Diagnosis Date  . Acute pancreatitis 2017   [facility]  . Aortic valve stenosis   . Arthritis   . CAD (coronary artery disease) 05/07/2011   Mild atherosclerosis by cardiac  catheterization February 2021  . GERD (gastroesophageal reflux disease)   . History of anemia   . History of atrial fibrillation 2013   Reportedly limited episode  . History of kidney stones   . History of MRSA infection   . History of pulmonary embolism 2017  . Hypothyroidism   . Pancreatitis 05/2015  . TIA (transient ischemic attack)    November 2020        Past Surgical History:  Procedure Laterality Date  . BACK SURGERY    . CHOLECYSTECTOMY    . COLONOSCOPY  07/08/2011   Procedure: COLONOSCOPY; Surgeon: Rogene Houston, MD; Location: [facility] ENDO SUITE; Service: Endoscopy; Laterality: N/A; 930  . DUODENOTOMY  09/29/2016   repair of fistula, jejunal resection  . INGUINAL HERNIA REPAIR N/A 03/01/2018   Procedure: LAPAROSCOPIC TEP BILATERAL INGUINAL HERNIA REPAIR; Surgeon: Johnathan Hausen, MD; Location: [facility] ORS; Service: General; Laterality: N/A;  . JEJUNOSTOMY FEEDING TUBE    . KNEE ARTHROSCOPY Left   . LEFT HEART CATHETERIZATION WITH CORONARY ANGIOGRAM N/A 05/07/2011   Procedure: LEFT HEART CATHETERIZATION WITH CORONARY ANGIOGRAM; Surgeon: Peter M Martinique, MD; Location: [facility] CATH LAB; Service: Cardiovascular; Laterality: N/A;  . PEG TUBE PLACEMENT    . RIGHT/LEFT HEART CATH AND CORONARY ANGIOGRAPHY N/A 02/28/2019   Procedure: RIGHT/LEFT HEART CATH AND CORONARY ANGIOGRAPHY; Surgeon: Burnell Blanks, MD; Location: [facility] CV LAB; Service: Cardiovascular; Laterality: N/A;  . VASECTOMY  Family History  Problem Relation Age of Onset  . Arthritis Mother   . Other Brother    staph infection   Social History        Socioeconomic History  . Marital status: Widowed    Spouse name: [name]  . Number of children: Not on file  . Years of education: Not on file  . Highest education level: High school graduate  Occupational History    Comment: retired   Tobacco Use  . Smoking status: Never Smoker  . Smokeless tobacco: Never Used  Substance and Sexual Activity  . Alcohol  use: No    Comment: none since Jun 26, 2015  . Drug use: No  . Sexual activity: Not on file  Other Topics Concern  . Not on file  Social History Narrative   Lives with wife   Caffeine- coffee 2 c daily, usually decaf   Social Determinants of Health      Financial Resource Strain:   . Difficulty of Paying Living Expenses: Not on file  Food Insecurity:   . Worried About Charity fundraiser in the Last Year: Not on file  . Ran Out of Food in the Last Year: Not on file  Transportation Needs:   . Lack of Transportation (Medical): Not on file  . Lack of Transportation (Non-Medical): Not on file  Physical Activity:   . Days of Exercise per Week: Not on file  . Minutes of Exercise per Session: Not on file  Stress:   . Feeling of Stress : Not on file  Social Connections:   . Frequency of Communication with Friends and Family: Not on file  . Frequency of Social Gatherings with Friends and Family: Not on file  . Attends Religious Services: Not on file  . Active Member of Clubs or Organizations: Not on file  . Attends Archivist Meetings: Not on file  . Marital Status: Not on file  Intimate Partner Violence:   . Fear of Current or Ex-Partner: Not on file  . Emotionally Abused: Not on file  . Physically Abused: Not on file  . Sexually Abused: Not on file         Current Outpatient Medications  Medication Sig Dispense Refill  . amoxicillin (AMOXIL) 500 MG capsule Take 2,000 mg by mouth See admin instructions. Take 4 capsules (2000 mg) 1 hour prior dental procedures.    [name] Kitchen aspirin 81 MG chewable tablet Chew 1 tablet (81 mg total) by mouth daily. 30 tablet 0  . atorvastatin (LIPITOR) 10 MG tablet Take 1 tablet (10 mg total) by mouth daily. 90 tablet 2  . levothyroxine (SYNTHROID, LEVOTHROID) 112 MCG tablet Take 224 mcg by mouth daily before breakfast.     . metoprolol tartrate (LOPRESSOR) 50 MG tablet Take one tablet by mouth at 8:30 AM on 03/06/2019 1 tablet 0   No current  facility-administered medications for this visit.   No Known Allergies   Review of Systems:   General: normal appetite, + decreased energy, no weight gain, no weight loss, no fever  Cardiac: no chest pain with exertion, no chest pain at rest, +SOB with mild exertion, no resting SOB, no PND, no orthopnea, no palpitations, no arrhythmia, + remote atrial fibrillation, + LE edema, no dizzy spells, no syncope  Respiratory: + exertional shortness of breath, no home oxygen, no productive cough, no dry cough, no bronchitis, no wheezing, no hemoptysis, no asthma, no pain with inspiration or cough, no sleep apnea, no CPAP  at night  GI: no difficulty swallowing, no reflux, no frequent heartburn, no hiatal hernia, no abdominal pain, no constipation, no diarrhea, no hematochezia, no hematemesis, no melena  GU: no dysuria, + frequency, no urinary tract infection, no hematuria, + enlarged prostate, + recently diagonosed kidney stones, no kidney disease  Vascular: no pain suggestive of claudication, no pain in feet, no leg cramps, no varicose veins, no DVT, no non-healing foot ulcer  Neuro: no stroke, + TIA, no seizures, bi headaches, bi temporary blindness one eye, bi slurred speech, bi peripheral neuropathy, bi chronic pain, bi instability of gait, bi memory/cognitive dysfunction  Musculoskeletal: bi arthritis, bi joint swelling, bi myalgias, bi difficulty walking, normal mobility  Skin: no rash, no itching, no skin infections, no pressure sores or ulcerations  Psych: no anxiety, no depression, no nervousness, no unusual recent stress  Eyes: no blurry vision, no floaters, no recent vision changes, + wears glasses or contacts  ENT: no hearing loss, no loose or painful teeth, no dentures, last saw dentist 12/2018  Hematologic: no easy bruising, no abnormal bleeding, no clotting disorder, no frequent epistaxis  Endocrine: no diabetes, does not check CBG's at home    Physical Exam:   BP 139/82 (BP Location:  Right Arm, Patient Position: Sitting, Cuff Size: Normal)  Pulse 80  Temp 97.9 F (36.6 C) (Temporal)  Resp 20  Ht 5\' 10"  (1.778 m)  Wt 219 lb (99.3 kg)  SpO2 98% Comment: RA  BMI 31.42 kg/m  General: Elderly but well-appearing  HEENT: Unremarkable, NCAT, PERLA, EOMI  Neck: no JVD, no bruits, no adenopathy  Chest: clear to auscultation, symmetrical breath sounds, no wheezes, no rhonchi  CV: RRR, grade lll/VI crescendo/decrescendo murmur heard best at RSB, no diastolic murmur  Abdomen: soft, non-tender, no masses  Extremities: warm, well-perfused, pulses palpable at ankle, trace LE edema  Rectal/GU Deferred  Neuro: Grossly non-focal and symmetrical throughout  Skin: Clean and dry, no rashes, no breakdown    Diagnostic Tests:   ECHOCARDIOGRAM REPORT     Patient Name: Andrew Watson Date of Exam: 12/06/2018  Medical Rec #: [id number] Height: 70.0 in  Accession #: [id number] Weight: 221.0 lb  Date of Birth: 28-May-1940 BSA: 2.18 m  Patient Age: 60 years BP: 125/71 mmHg  Patient Gender: M HR: 89 bpm.  Exam Location: Inpatient   Procedure: 2D Echo, Intracardiac Opacification Agent and Saline Contrast  Bubble  Study   Indications: TIA (transient ischemic attack) 435.9 / G45.9   History: Patient has prior history of Echocardiogram examinations,  most  recent 04/02/2017. Amaurosis fugax.   Sonographer: Darlina Sicilian RDCS  Referring Phys: [phone] ANKIT NANAVATI    Sonographer Comments: Last echo perfored at [facility] notes mean gradient of  20-66mmGH, visually severe AS. Patient claims he doesn't expirence any  dyspnea with exertion, but others tell him he is becoming shot of breath.  IMPRESSIONS    1. Left ventricular ejection fraction, by visual estimation, is 60 to  65%. The left ventricle has normal function. There is mildly increased  left ventricular hypertrophy.  2. The left ventricle has no regional wall motion abnormalities.  3. Definity contrast agent was given IV  to delineate the left ventricular  endocardial borders.  4. Left ventricular diastolic parameters are consistent with Grade I  diastolic dysfunction (impaired relaxation).  5. Global right ventricle has normal systolic function.The right  ventricular size is normal. No increase in right ventricular wall  thickness.  6. Left atrial size was normal.  7. Right atrial size was normal.  8. Mild to moderate mitral annular calcification.  9. The mitral valve is normal in structure. No evidence of mitral valve  regurgitation. No evidence of mitral stenosis.  10. The tricuspid valve is normal in structure. Tricuspid valve  regurgitation is trivial.  11. The aortic valve is tricuspid. Aortic valve regurgitation is not  visualized. Severe aortic valve stenosis. Mean gradient 53 mmHg with AVA  0.57 cm^2.  12. The inferior vena cava is normal in size with greater than 50%  respiratory variability, suggesting right atrial pressure of 3 mmHg.  13. The tricuspid regurgitant velocity is 2.47 m/s, and with an assumed  right atrial pressure of 3 mmHg, the estimated right ventricular systolic  pressure is normal at 27.4 mmHg.   FINDINGS  Left Ventricle: Left ventricular ejection fraction, by visual estimation,  is 60 to 65%. The left ventricle has normal function. Definity contrast  agent was given IV to delineate the left ventricular endocardial borders.  The left ventricle has no  regional wall motion abnormalities. There is mildly increased left  ventricular hypertrophy. Left ventricular diastolic parameters are  consistent with Grade I diastolic dysfunction (impaired relaxation).   Right Ventricle: The right ventricular size is normal. No increase in  right ventricular wall thickness. Global RV systolic function is has  normal systolic function. The tricuspid regurgitant velocity is 2.47 m/s,  and with an assumed right atrial pressure  of 3 mmHg, the estimated right ventricular systolic pressure  is normal at  27.4 mmHg.   Left Atrium: Left atrial size was normal in size.   Right Atrium: Right atrial size was normal in size   Pericardium: There is no evidence of pericardial effusion.   Mitral Valve: The mitral valve is normal in structure. There is mild  calcification of the mitral valve leaflet(s). Mild to moderate mitral  annular calcification. No evidence of mitral valve stenosis by  observation. No evidence of mitral valve  regurgitation.   Tricuspid Valve: The tricuspid valve is normal in structure. Tricuspid  valve regurgitation is trivial.   Aortic Valve: The aortic valve is tricuspid. Aortic valve regurgitation is  not visualized. Severe aortic stenosis is present. Aortic valve mean  gradient measures 50.6 mmHg. Aortic valve peak gradient measures 77.3  mmHg. Aortic valve area, by VTI measures  0.57 cm.   Pulmonic Valve: The pulmonic valve was normal in structure. Pulmonic valve  regurgitation is not visualized.   Aorta: The aortic root is normal in size and structure.   Venous: The inferior vena cava is normal in size with greater than 50%  respiratory variability, suggesting right atrial pressure of 3 mmHg.   IAS/Shunts: No atrial level shunt detected by color flow Doppler. Agitated  saline contrast was given intravenously to evaluate for intracardiac  shunting.     LEFT VENTRICLE  PLAX 2D  LVIDd: 4.00 cm Diastology  LVIDs: 2.80 cm LV e' lateral: 7.72 cm/s  LV PW: 1.10 cm LV E/e' lateral: 11.6  LV IVS: 1.20 cm LV e' medial: 8.38 cm/s  LVOT diam: 2.00 cm LV E/e' medial: 10.7  LV SV: 40 ml  LV SV Index: 17.94  LVOT Area: 3.14 cm    LEFT ATRIUM Index  LA diam: 4.20 cm 1.93 cm/m  LA Vol (A4C): 53.5 ml 24.57 ml/m  AORTIC VALVE  AV Area (Vmax): 0.65 cm  AV Area (Vmean): 0.57 cm  AV Area (VTI): 0.57 cm  AV Vmax: 439.60 cm/s  AV Vmean: 342.000  cm/s  AV VTI: 0.973 m  AV Peak Grad: 77.3 mmHg  AV Mean Grad: 50.6 mmHg  LVOT Vmax: 90.80 cm/s    LVOT Vmean: 62.200 cm/s  LVOT VTI: 0.177 m  LVOT/AV VTI ratio: 0.18   AORTA  Ao Root diam: 3.00 cm   MITRAL VALVE TRICUSPID VALVE  MV Area (PHT): 3.65 cm TR Peak grad: 24.4 mmHg  MV PHT: 60.32 msec TR Vmax: 247.00 cm/s  MV Decel Time: 208 msec  MV E velocity: 89.40 cm/s 103 cm/s SHUNTS  MV A velocity: 130.00 cm/s 70.3 cm/s Systemic VTI: 0.18 m  MV E/A ratio: 0.69 1.5 Systemic Diam: 2.00 cm    Loralie Champagne MD  Electronically signed by Loralie Champagne MD  Signature Date/Time: 12/06/2018/12:17:00 PM  Physicians  Panel Physicians Referring Physician Case Authorizing Physician  Burnell Blanks, MD (Primary)         Procedures  RIGHT/LEFT HEART CATH AND CORONARY ANGIOGRAPHY     Conclusion  Mid LAD lesion is 20% stenosed.  1st Sept lesion is 80% stenosed.  2nd Mrg lesion is 20% stenosed.  Hemodynamic findings consistent with aortic valve stenosis. 1. Mild non-obstructive CAD  2. Severe aortic valve stenosis (mean gradient 60.2 mmHg, peak to peak gradient 75 mmHg, AVA 0.55 cm2)  Recommendations: Will continue medical management of mild CAD. Continue with workup for TAVR.      Recommendations  Antiplatelet/Anticoag Medical management of mild CAD. Proceed with workup for TAVR.        Indications  Severe aortic stenosis [I35.0 (ICD-10-CM)]     Procedural Details  Technical Details Indication: Severe aortic stenosis  Procedure: The risks, benefits, complications, treatment options, and expected outcomes were discussed with the patient. The patient and/or family concurred with the proposed plan, giving informed consent. The patient was brought to the cath lab after IV hydration was given. The patient was sedated with Versed and Fentanyl. The IV catheter in the right antecubital vein was changed for a 5 [place] sheath. Right heart catheterization performed with a balloon tipped catheter. The right wrist was prepped and draped in a sterile fashion. 1% lidocaine was used  for local anesthesia. Using the modified Seldinger access technique, a 5 French sheath was placed in the right radial artery. 3 mg Verapamil was given through the sheath. 5000 units IV heparin was given. Standard diagnostic catheters were used to perform selective coronary angiography. I crossed the aortic valve with an AL-1 catheter and a J wire. The sheath was removed from the right radial artery and a Terumo hemostasis band was applied at the arteriotomy site on the right wrist.    Estimated blood loss <50 mL.   During this procedure medications were administered to achieve and maintain moderate conscious sedation while the patient's heart rate, blood pressure, and oxygen saturation were continuously monitored and I was present face-to-face 100% of this time.     Medications  (Filter: Administrations occurring from 02/28/19 1230 to 02/28/19 1334)  Continuous medications are totaled by the amount administered until 02/28/19 1334.  Heparin (Porcine) in NaCl 1000-0.9 UT/500ML-% SOLN (mL)  Total volume: 500 mL  Date/Time   Rate/Dose/Volume Action  02/28/19 1236  500 mL Given  Heparin (Porcine) in NaCl 1000-0.9 UT/500ML-% SOLN (mL)  Total volume: 500 mL  Date/Time   Rate/Dose/Volume Action  02/28/19 1236  500 mL Given  fentaNYL (SUBLIMAZE) injection (mcg)  Total dose: 25 mcg  Date/Time   Rate/Dose/Volume Action  02/28/19 1240  25 mcg Given  midazolam (VERSED)  injection (mg)  Total dose: 1 mg  Date/Time   Rate/Dose/Volume Action  02/28/19 1240  1 mg Given  lidocaine (PF) (XYLOCAINE) 1 % injection (mL)  Total volume: 6 mL  Date/Time   Rate/Dose/Volume Action  02/28/19 1248  3 mL Given  1248  3 mL Given  Radial Cocktail/Verapamil only (mL)  Total volume: 10 mL  Date/Time   Rate/Dose/Volume Action  02/28/19 1250  10 mL Given  heparin injection (Units)  Total dose: 5,000 Units  Date/Time   Rate/Dose/Volume Action  02/28/19 1301  5,000 Units Given  iohexol (OMNIPAQUE) 350 MG/ML  injection (mL)  Total volume: 60 mL  Date/Time   Rate/Dose/Volume Action  02/28/19 1324  60 mL Given     Sedation Time  Sedation Time Physician-1: 41 minutes 49 seconds        Contrast  Medication Name Total Dose  iohexol (OMNIPAQUE) 350 MG/ML injection 60 mL     Radiation/Fluoro  Fluoro time: 6.8 (min)  DAP: 14881 (mGycm2)  Cumulative Air Kerma: 123XX123 (mGy)     Complications     Complications documented before study signed (02/28/2019 1:34 PM)  RIGHT/LEFT HEART CATH AND CORONARY ANGIOGRAPHY  None Documented by Burnell Blanks, MD 02/28/2019 1:27 PM  Date Found: 02/28/2019  Time Range: Intraprocedure       Coronary Findings  Diagnostic  Dominance: Left  Left Anterior Descending  Mid LAD lesion 20% stenosed  Mid LAD lesion is 20% stenosed.   First Diagonal Branch  Vessel is small in size.   First Septal Branch  Vessel is small in size.  1st Sept lesion 80% stenosed  1st Sept lesion is 80% stenosed.   Second Diagonal Branch  Vessel is moderate in size.   Left Circumflex  Vessel is large.   Second Obtuse Marginal Branch  Vessel is moderate in size.  2nd Mrg lesion 20% stenosed  2nd Mrg lesion is 20% stenosed.   Right Coronary Artery  Vessel is small.  Intervention  No interventions have been documented.          Right Heart  Right Heart Pressures Hemodynamic findings consistent with aortic valve stenosis.                    Coronary Diagrams  Diagnostic  Dominance: Left  &&&&&  Intervention       Implants  No implant documentation for this case.      Syngo Images Link to Procedure Log  Show images for CARDIAC CATHETERIZATION Procedure Log     Images on Long Term Storage   Show images for [place], [place]            [facility] Data    Most Recent Value  Fick Cardiac Output 5.11 L/min  Fick Cardiac Output Index 2.35 (L/min)/BSA  Aortic Mean Gradient 60.16 mmHg  Aortic Peak Gradient 75 mmHg  Aortic Valve Area 0.55   Aortic Value Area Index 0.25 cm2/BSA  RA A Wave 6 mmHg  RA V Wave 6 mmHg  RA Mean 5 mmHg  RV Systolic Pressure 32 mmHg  RV Diastolic Pressure 0 mmHg  RV EDP 6 mmHg  PA Systolic Pressure 30 mmHg  PA Diastolic Pressure 11 mmHg  PA Mean 20 mmHg  PW A Wave 20 mmHg  PW V Wave 22 mmHg  PW Mean 18 mmHg  AO Systolic Pressure 0000000 mmHg  AO Diastolic Pressure 69 mmHg  AO Mean 92 mmHg  LV Systolic Pressure 99991111 mmHg  LV Diastolic  Pressure 10 mmHg  LV EDP 17 mmHg  AOp Systolic Pressure [id number] mmHg  AOp Diastolic Pressure 57 mmHg  AOp Mean Pressure 81 mmHg  LVp Systolic Pressure 99991111 mmHg  LVp Diastolic Pressure 13 mmHg  LVp EDP Pressure 19 mmHg  QP/QS 1  TPVR Index 8.51 HRUI  TSVR Index 39.16 HRUI  PVR SVR Ratio 0.02  TPVR/TSVR Ratio 0.22    ADDENDUM REPORT: 03/07/2019 05:24  CLINICAL DATA: Aortic stenosis  EXAM:  Cardiac TAVR CT  TECHNIQUE:  The patient was scanned on a Siemens Force [id number] slice scanner. A 120  kV retrospective scan was triggered in the descending thoracic aorta  at 111 HU's. Gantry rotation speed was 270 msecs and collimation was  .9 mm. No beta blockade or nitro were given. The 3D data set was  reconstructed in 5% intervals of the R-R cycle. Systolic and  diastolic phases were analyzed on a dedicated work station using  MPR, MIP and VRT modes. The patient received 152mL OMNIPAQUE IOHEXOL  350 MG/ML SOLN of contrast.  FINDINGS:  Aortic Valve: Tricuspid aortic valve. Severely reduced cusp  separation. Moderately thickened, severely calcified aortic valve  cusps.  AV calcium score: 3494  Virtual Basal Annulus Measurements:  Maximum/Minimum Diameter: 29.9 x 23.4 mm  Perimeter: 83.2 mm  Area: 528 mm2  Calcification protruding into the annulus. Calcifications present  along aorto-mitral continuity.  Based on these measurements, the annulus would be suitable for a 26  mm Sapien 3 valve.  Sinus of Valsalva Measurements:  Non-coronary: 30 mm  Right - coronary: 31  mm  Left - coronary: 33 mm  Sinus of Valsalva Height:  Left: 13.4 mm  Right:15.1 mm  Aorta: Trivial calcifications in the aortic arch. Short segment  common origin of the brachiocephalic and left common carotid  arteries.  Sinotubular Junction: 30 mm  Ascending Thoracic Aorta: 34 mm  Aortic Arch: 28 mm  Descending Thoracic Aorta: 26 mm  Coronary Artery Height above Annulus:  Left Main: 10.1 mm to LAD ostium, 12.2 mm to L circumflex ostium.  Right Coronary: 11.5 mm  Coronary Arteries: 3 vessel coronary artery calcifications. Separate  ostia of the left circumflex and LAD.  Optimum Fluoroscopic Angle for Delivery: LAO 14, CAU 15  No left atrial appendage thrombus.  Normal pulmonary venous flow.  IMPRESSION:  1. Aortic Valve: Tricuspid aortic valve. Severely reduced cusp  separation. Moderately thickened, severely calcified aortic valve  cusps.  2. AV calcium score: 3494  3. Annulus area 528 mm2. Calcification in the LVOT along  aorto-mitral continuity. Annulus would be suitable for a 26 mm  Sapien 3 valve.  4. Coronary heights: LAD 10.1 mm, L circumflex 12.2 mm, RCA 11.5 mm.  5. Normal caliber thoracic aorta with trivial calcifications in the  aortic arch.  6. Optimum Fluoroscopic Angle for Delivery: LAO 23, CAU 15  Electronically Signed  By: Cherlynn Kaiser  On: 03/07/2019 05:24  CLINICAL DATA: Severe symptomatic aortic stenosis. Pre-TAVR  evaluation.  EXAM:  CT ANGIOGRAPHY CHEST, ABDOMEN AND PELVIS  TECHNIQUE:  Multidetector CT imaging through the chest, abdomen and pelvis was  performed using the standard protocol during bolus administration of  intravenous contrast. Multiplanar reconstructed images and MIPs were  obtained and reviewed to evaluate the vascular anatomy.  CONTRAST: 162mL OMNIPAQUE IOHEXOL 350 MG/ML SOLN  COMPARISON: 12/31/2017 CT abdomen/pelvis. No prior chest CT.  11/24/2015 chest radiograph.  FINDINGS:  CTA CHEST FINDINGS  Cardiovascular: Mild  cardiomegaly. No significant pericardial  effusion/thickening. Diffuse thickening and  coarse calcification of  the aortic valve. Left anterior descending coronary atherosclerosis.  Atherosclerotic nonaneurysmal thoracic aorta. Patent aortic arch  branch vessels. Normal caliber pulmonary arteries. No central  pulmonary emboli.  Mediastinum/Nodes: Hypodense 1.5 cm anterior right thyroid nodule.  Unremarkable esophagus. No pathologically enlarged axillary,  mediastinal or hilar lymph nodes.  Lungs/Pleura: No pneumothorax. Small basilar right pleural effusion  with smooth right pleural thickening. No left pleural effusion. No  central airway stenoses. Masslike consolidation in the posterior  right lower lobe measures 5.9 x 2.0 cm (series 16/image 67),  abutting the site of pleural thickening and pleural effusion, with  associated volume loss and surrounding parenchymal banding, most  compatible with rounded atelectasis. Otherwise no acute  consolidative airspace disease, lung masses or significant pulmonary  nodules.  Musculoskeletal: No aggressive appearing focal osseous lesions. Mild  thoracic spondylosis.  CTA ABDOMEN AND PELVIS FINDINGS  Hepatobiliary: Normal liver with no liver mass. Cholecystectomy.  Bile ducts are stable and within normal post cholecystectomy limits.  CBD diameter 7 mm.  Pancreas: Normal, with no mass or duct dilation.  Spleen: Normal size. No mass.  Adrenals/Urinary Tract: Normal adrenals. There is a 7 mm left  ureteropelvic junction stone with mild-to-moderate left  hydronephrosis. No right hydronephrosis. Hypodense 1.5 cm anterior  interpolar left renal cortical lesion is stable in size since  12/31/2017 CT, presumably benign. No additional contour deforming  renal lesions. Mild diffuse bladder wall thickening is not  appreciably changed. Bladder obscured by streak artifact from left  hip hardware.  Stomach/Bowel: Normal non-distended stomach. Normal caliber  small  bowel with no small bowel wall thickening. Normal appendix. Minimal  sigmoid diverticulosis. No large bowel wall thickening or  significant pericolonic fat stranding.  Vascular/Lymphatic: Atherosclerotic nonaneurysmal abdominal aorta.  Patent splenic, portal and renal veins. No pathologically enlarged  lymph nodes in the abdomen or pelvis.  Reproductive: Mildly enlarged prostate. Nonspecific internal  prostatic calcifications.  Other: No pneumoperitoneum, ascites or focal fluid collection.  Chronic mild haziness of the central mesenteric fat is unchanged.  Musculoskeletal: No aggressive appearing focal osseous lesions. Left  total hip arthroplasty. Moderate lumbar spondylosis.  VASCULAR MEASUREMENTS PERTINENT TO TAVR:  AORTA:  Minimal Aortic Diameter-16.7 x 15.3 mm  Severity of Aortic Calcification-mild  RIGHT PELVIS:  Right Common Iliac Artery -  Minimal Diameter-11.0 x 10.8 mm  Tortuosity-mild  Calcification-mild  Right External Iliac Artery -  Minimal Diameter-7.7 x 7.2 mm  Tortuosity-mild  Calcification-none  Right Common Femoral Artery -  Minimal Diameter-7.6 x 7.5 mm  Tortuosity-mild  Calcification-none  LEFT PELVIS:  Left Common Iliac Artery -  Minimal Diameter-10.9 x 8.3 mm  Tortuosity-mild  Calcification-moderate  Left External Iliac Artery -  Minimal Diameter-7.9 x 7.3 mm  Tortuosity-mild  Calcification-none  Left Common Femoral Artery -  Minimal Diameter-a 0.8 x 8.7 mm  Tortuosity-mild  Calcification-none  Review of the MIP images confirms the above findings.  IMPRESSION:  1. Vascular findings and measurements pertinent to potential TAVR  procedure, as detailed.  2. Diffuse thickening and coarse calcification of the aortic valve,  compatible with the reported history of severe symptomatic aortic  stenosis.  3. Obstructing 7 mm left UPJ stone with mild-to-moderate left  hydronephrosis.  4. Small basilar right pleural effusion with smooth right  pleural  thickening, suggesting chronic loculated right pleural effusion.  5. Findings at the right lung base most compatible with rounded  atelectasis. Follow-up chest CT advised in 3 months to document  stability of this finding.  6. Mild cardiomegaly. Coronary atherosclerosis.  7. Nonspecific mild diffuse bladder wall thickening, probably due to  chronic bladder outlet obstruction by the mildly enlarged prostate.  8. Right thyroid 1.5 cm nodule. Thyroid ultrasound correlation  indicated if not previously performed.(Ref: J Am Coll Radiol. 2015  Feb;12(2): 143-50).  9. Aortic Atherosclerosis (ICD10-I70.0).  These results will be called to the ordering clinician or  representative by the Radiologist Assistant, and communication  documented in the PACS or zVision Dashboard.  Electronically Signed  By: Ilona Sorrel M.D.  On: 03/06/2019 13:04    STS Risk Score:   Risk of Mortality:  1.784%  Renal Failure:  3.266%  Permanent Stroke:  3.369%  Prolonged Ventilation:  7.939%  DSW Infection:  0.143%  Reoperation:  5.948%  Morbidity or Mortality:  14.356%  Short Length of Stay:  23.696%  Long Length of Stay:  7.269%    Impression:   This patient is a 79 year old gentleman with stage D, severe, symptomatic aortic stenosis with New York Heart Association class III symptoms of exertional fatigue and shortness of breath consistent with chronic diastolic congestive heart failure. I have personally reviewed his 2D echocardiogram, cardiac catheterization, and CTA studies. His echocardiogram shows a severely calcified and thickened aortic valve with restricted mobility of the leaflets. The mean gradient across the aortic valve is 50.6 mmHg with a calculated aortic valve area of 0.57 cm. Left ventricular ejection fraction is 60 to 65%. Cardiac catheterization shows mild nonobstructive coronary disease. The mean gradient across the aortic valve is measured at 60 mmHg consistent with severe  aortic stenosis. I agree that aortic valve replacement is indicated in this patient for improvement of symptoms and prevent progressive left ventricular deterioration. Given his advanced age I think transcatheter aortic valve replacement would be the best treatment for him. His gated cardiac CTA shows anatomy suitable for TAVR using a SAPIEN 3 valve. His abdominal and pelvic CTA shows adequate pelvic vascular anatomy to allow transfemoral insertion. His abdominal and pelvic CTA also showed a 7 to 10 mm obstructing left ureteral calculus with left kidney hydronephrosis. This was treated by Dr. Jeffie Pollock on 03/30/2019 with left ureteroscopic stone extraction and laser lithotripsy and insertion of a left double-J stent. He has been seen back and had the stent removed.   The patient and his significant other were counseled at length regarding treatment alternatives for management of severe symptomatic aortic stenosis. The risks and benefits of surgical intervention has been discussed in detail. Long-term prognosis with medical therapy was discussed. Alternative approaches such as conventional surgical aortic valve replacement, transcatheter aortic valve replacement, and palliative medical therapy were compared and contrasted at length. This discussion was placed in the context of the patient's own specific clinical presentation and past medical history. All of their questions have been addressed.   Following the decision to proceed with transcatheter aortic valve replacement, a discussion was held regarding what types of management strategies would be attempted intraoperatively in the event of life-threatening complications, including whether or not the patient would be considered a candidate for the use of cardiopulmonary bypass and/or conversion to open sternotomy for attempted surgical intervention. He is 79 years old but a low risk surgical patient and I think he would be a candidate for emergent sternotomy if needed  to manage any intraoperative complications. The patient has been advised of a variety of complications that might develop including but not limited to risks of death, stroke, paravalvular leak, aortic dissection or other major vascular complications,  aortic annulus rupture, device embolization, cardiac rupture or perforation, mitral regurgitation, acute myocardial infarction, arrhythmia, heart block or bradycardia requiring permanent pacemaker placement, congestive heart failure, respiratory failure, renal failure, pneumonia, infection, other late complications related to structural valve deterioration or migration, or other complications that might ultimately cause a temporary or permanent loss of functional independence or other long term morbidity. The patient provides full informed consent for the procedure as described and all questions were answered.   Plan:   Transfemoral transcatheter aortic valve replacement.   Gaye Pollack, MD

## 2019-05-02 ENCOUNTER — Inpatient Hospital Stay (HOSPITAL_COMMUNITY): Payer: Medicare Other

## 2019-05-02 ENCOUNTER — Other Ambulatory Visit: Payer: Self-pay

## 2019-05-02 ENCOUNTER — Encounter (HOSPITAL_COMMUNITY)
Admission: RE | Disposition: A | Discharge status: Home / Self Care | Payer: Self-pay | Source: Home / Self Care | Attending: Cardiovascular Disease

## 2019-05-02 ENCOUNTER — Inpatient Hospital Stay (HOSPITAL_COMMUNITY)
Admission: RE | Admit: 2019-05-02 | Discharge: 2019-05-03 | DRG: 266 | Disposition: A | Discharge status: Home / Self Care | Payer: Medicare Other | Attending: Cardiovascular Disease | Admitting: Cardiovascular Disease

## 2019-05-02 ENCOUNTER — Encounter (HOSPITAL_COMMUNITY): Payer: Self-pay | Admitting: Cardiovascular Disease

## 2019-05-02 ENCOUNTER — Other Ambulatory Visit: Payer: Self-pay | Admitting: Physician Assistant

## 2019-05-02 ENCOUNTER — Inpatient Hospital Stay (HOSPITAL_COMMUNITY): Payer: Medicare Other | Admitting: Certified Registered"

## 2019-05-02 ENCOUNTER — Inpatient Hospital Stay (HOSPITAL_COMMUNITY): Payer: Medicare Other | Admitting: Emergency Medicine

## 2019-05-02 DIAGNOSIS — Z8673 Personal history of transient ischemic attack (TIA), and cerebral infarction without residual deficits: Secondary | ICD-10-CM

## 2019-05-02 DIAGNOSIS — Z8261 Family history of arthritis: Secondary | ICD-10-CM | POA: Diagnosis not present

## 2019-05-02 DIAGNOSIS — Z79890 Hormone replacement therapy (postmenopausal): Secondary | ICD-10-CM | POA: Diagnosis not present

## 2019-05-02 DIAGNOSIS — N200 Calculus of kidney: Secondary | ICD-10-CM

## 2019-05-02 DIAGNOSIS — Z980 Intestinal bypass and anastomosis status: Secondary | ICD-10-CM

## 2019-05-02 DIAGNOSIS — Z952 Presence of prosthetic heart valve: Secondary | ICD-10-CM | POA: Diagnosis not present

## 2019-05-02 DIAGNOSIS — Z8679 Personal history of other diseases of the circulatory system: Secondary | ICD-10-CM

## 2019-05-02 DIAGNOSIS — Z86711 Personal history of pulmonary embolism: Secondary | ICD-10-CM

## 2019-05-02 DIAGNOSIS — I350 Nonrheumatic aortic (valve) stenosis: Secondary | ICD-10-CM

## 2019-05-02 DIAGNOSIS — Z006 Encounter for examination for normal comparison and control in clinical research program: Secondary | ICD-10-CM | POA: Diagnosis not present

## 2019-05-02 DIAGNOSIS — E039 Hypothyroidism, unspecified: Secondary | ICD-10-CM | POA: Diagnosis present

## 2019-05-02 DIAGNOSIS — K219 Gastro-esophageal reflux disease without esophagitis: Secondary | ICD-10-CM | POA: Diagnosis not present

## 2019-05-02 DIAGNOSIS — Z8616 Personal history of COVID-19: Secondary | ICD-10-CM | POA: Diagnosis not present

## 2019-05-02 DIAGNOSIS — Z8614 Personal history of Methicillin resistant Staphylococcus aureus infection: Secondary | ICD-10-CM | POA: Diagnosis not present

## 2019-05-02 DIAGNOSIS — I440 Atrioventricular block, first degree: Secondary | ICD-10-CM | POA: Diagnosis not present

## 2019-05-02 DIAGNOSIS — I5033 Acute on chronic diastolic (congestive) heart failure: Secondary | ICD-10-CM | POA: Diagnosis present

## 2019-05-02 DIAGNOSIS — Z87442 Personal history of urinary calculi: Secondary | ICD-10-CM

## 2019-05-02 DIAGNOSIS — I5032 Chronic diastolic (congestive) heart failure: Secondary | ICD-10-CM

## 2019-05-02 DIAGNOSIS — I2510 Atherosclerotic heart disease of native coronary artery without angina pectoris: Secondary | ICD-10-CM | POA: Diagnosis not present

## 2019-05-02 DIAGNOSIS — Z7982 Long term (current) use of aspirin: Secondary | ICD-10-CM

## 2019-05-02 DIAGNOSIS — G4700 Insomnia, unspecified: Secondary | ICD-10-CM | POA: Diagnosis not present

## 2019-05-02 DIAGNOSIS — Z79899 Other long term (current) drug therapy: Secondary | ICD-10-CM

## 2019-05-02 HISTORY — PX: TRANSCATHETER AORTIC VALVE REPLACEMENT, TRANSFEMORAL: SHX6400

## 2019-05-02 HISTORY — PX: INTRAOPERATIVE TRANSTHORACIC ECHOCARDIOGRAM: SHX6523

## 2019-05-02 HISTORY — DX: Presence of prosthetic heart valve: Z95.2

## 2019-05-02 HISTORY — DX: Personal history of transient ischemic attack (TIA), and cerebral infarction without residual deficits: Z86.73

## 2019-05-02 LAB — URINALYSIS, ROUTINE W REFLEX MICROSCOPIC
Bilirubin Urine: NEGATIVE
Glucose, UA: NEGATIVE mg/dL
Ketones, ur: NEGATIVE mg/dL
Leukocytes,Ua: NEGATIVE
Nitrite: NEGATIVE
Protein, ur: NEGATIVE mg/dL
Specific Gravity, Urine: 1.02 (ref 1.005–1.030)
pH: 5 (ref 5.0–8.0)

## 2019-05-02 LAB — POCT I-STAT, CHEM 8
BUN: 19 mg/dL (ref 8–23)
BUN: 18 mg/dL (ref 8–23)
BUN: 19 mg/dL (ref 8–23)
BUN: 18 mg/dL (ref 8–23)
Calcium, Ion: 1.33 mmol/L (ref 1.15–1.40)
Calcium, Ion: 1.34 mmol/L (ref 1.15–1.40)
Calcium, Ion: 1.36 mmol/L (ref 1.15–1.40)
Calcium, Ion: 1.32 mmol/L (ref 1.15–1.40)
Chloride: 106 mmol/L (ref 98–111)
Chloride: 105 mmol/L (ref 98–111)
Chloride: 106 mmol/L (ref 98–111)
Chloride: 106 mmol/L (ref 98–111)
Creatinine, Ser: 0.9 mg/dL (ref 0.61–1.24)
Creatinine, Ser: 0.9 mg/dL (ref 0.61–1.24)
Creatinine, Ser: 0.9 mg/dL (ref 0.61–1.24)
Creatinine, Ser: 0.9 mg/dL (ref 0.61–1.24)
Glucose, Bld: 98 mg/dL (ref 70–99)
Glucose, Bld: 109 mg/dL — ABNORMAL HIGH (ref 70–99)
Glucose, Bld: 110 mg/dL — ABNORMAL HIGH (ref 70–99)
Glucose, Bld: 103 mg/dL — ABNORMAL HIGH (ref 70–99)
HCT: 34 % — ABNORMAL LOW (ref 39.0–52.0)
HCT: 30 % — ABNORMAL LOW (ref 39.0–52.0)
HCT: 31 % — ABNORMAL LOW (ref 39.0–52.0)
HCT: 31 % — ABNORMAL LOW (ref 39.0–52.0)
Hemoglobin: 11.6 g/dL — ABNORMAL LOW (ref 13.0–17.0)
Hemoglobin: 10.2 g/dL — ABNORMAL LOW (ref 13.0–17.0)
Hemoglobin: 10.5 g/dL — ABNORMAL LOW (ref 13.0–17.0)
Hemoglobin: 10.5 g/dL — ABNORMAL LOW (ref 13.0–17.0)
Potassium: 4.3 mmol/L (ref 3.5–5.1)
Potassium: 4.5 mmol/L (ref 3.5–5.1)
Potassium: 4.5 mmol/L (ref 3.5–5.1)
Potassium: 4.5 mmol/L (ref 3.5–5.1)
Sodium: 142 mmol/L (ref 135–145)
Sodium: 141 mmol/L (ref 135–145)
Sodium: 141 mmol/L (ref 135–145)
Sodium: 141 mmol/L (ref 135–145)
TCO2: 26 mmol/L (ref 22–32)
TCO2: 28 mmol/L (ref 22–32)
TCO2: 28 mmol/L (ref 22–32)
TCO2: 26 mmol/L (ref 22–32)

## 2019-05-02 SURGERY — IMPLANTATION, AORTIC VALVE, TRANSCATHETER, FEMORAL APPROACH
Anesthesia: Monitor Anesthesia Care | Site: Chest

## 2019-05-02 MED ORDER — PROPOFOL 10 MG/ML IV BOLUS
INTRAVENOUS | Status: AC
  Filled 2019-05-02: qty 20

## 2019-05-02 MED ORDER — CHLORHEXIDINE GLUCONATE 0.12 % MT SOLN
OROMUCOSAL | Status: AC
  Filled 2019-05-02: qty 15

## 2019-05-02 MED ORDER — FENTANYL CITRATE (PF) 250 MCG/5ML IJ SOLN
INTRAMUSCULAR | Status: AC
  Filled 2019-05-02: qty 5

## 2019-05-02 MED ORDER — LACTATED RINGERS IV SOLN: INTRAVENOUS | Status: DC | PRN

## 2019-05-02 MED ORDER — LIDOCAINE HCL 1 % IJ SOLN
INTRAMUSCULAR | Status: AC
  Filled 2019-05-02: qty 20

## 2019-05-02 MED ORDER — ONDANSETRON HCL 4 MG/2ML IJ SOLN: 4.0000 mg | Freq: Four times a day (QID) | INTRAMUSCULAR | Status: DC | PRN

## 2019-05-02 MED ORDER — NITROGLYCERIN IN D5W 200-5 MCG/ML-% IV SOLN: 0.0000 ug/min | INTRAVENOUS | Status: DC

## 2019-05-02 MED ORDER — LIDOCAINE HCL 1 % IJ SOLN
INTRAMUSCULAR | Status: DC | PRN
  Administered 2019-05-02: 17 mL via INTRADERMAL

## 2019-05-02 MED ORDER — HEPARIN SODIUM (PORCINE) 1000 UNIT/ML IJ SOLN
INTRAMUSCULAR | Status: DC | PRN
  Administered 2019-05-02: 10000 [IU] via INTRAVENOUS

## 2019-05-02 MED ORDER — VANCOMYCIN HCL IN DEXTROSE 1-5 GM/200ML-% IV SOLN
1000.0000 mg | Freq: Once | INTRAVENOUS | Status: AC
  Administered 2019-05-02: 1000 mg via INTRAVENOUS
  Filled 2019-05-02: qty 200

## 2019-05-02 MED ORDER — IODIXANOL 320 MG/ML IV SOLN
INTRAVENOUS | Status: DC | PRN
  Administered 2019-05-02: 07:00:00 120 mL via INTRA_ARTERIAL

## 2019-05-02 MED ORDER — PHENYLEPHRINE 40 MCG/ML (10ML) SYRINGE FOR IV PUSH (FOR BLOOD PRESSURE SUPPORT)
PREFILLED_SYRINGE | INTRAVENOUS | Status: DC | PRN
  Administered 2019-05-02: 80 ug via INTRAVENOUS

## 2019-05-02 MED ORDER — MORPHINE SULFATE (PF) 2 MG/ML IV SOLN: 1.0000 mg | INTRAVENOUS | Status: DC | PRN

## 2019-05-02 MED ORDER — CHLORHEXIDINE GLUCONATE 4 % EX LIQD: 60.0000 mL | Freq: Once | CUTANEOUS | Status: DC

## 2019-05-02 MED ORDER — SODIUM CHLORIDE 0.9 % IV SOLN
1.5000 g | Freq: Two times a day (BID) | INTRAVENOUS | Status: DC
  Administered 2019-05-02 – 2019-05-03 (×2): 1.5 g via INTRAVENOUS
  Filled 2019-05-02 (×3): qty 1.5

## 2019-05-02 MED ORDER — SODIUM CHLORIDE 0.9 % IV SOLN
INTRAVENOUS | Status: DC | PRN
  Administered 2019-05-02 (×3): 500 mL

## 2019-05-02 MED ORDER — SODIUM CHLORIDE 0.9% FLUSH: 3.0000 mL | INTRAVENOUS | Status: DC | PRN

## 2019-05-02 MED ORDER — PROTAMINE SULFATE 10 MG/ML IV SOLN
INTRAVENOUS | Status: DC | PRN
  Administered 2019-05-02: 90 mg via INTRAVENOUS
  Administered 2019-05-02: 10 mg via INTRAVENOUS

## 2019-05-02 MED ORDER — ATORVASTATIN CALCIUM 10 MG PO TABS
10.0000 mg | ORAL_TABLET | Freq: Every day | ORAL | Status: DC
  Administered 2019-05-02 – 2019-05-03 (×2): 10 mg via ORAL
  Filled 2019-05-02 (×2): qty 1

## 2019-05-02 MED ORDER — ONDANSETRON HCL 4 MG/2ML IJ SOLN
INTRAMUSCULAR | Status: DC | PRN
  Administered 2019-05-02: 4 mg via INTRAVENOUS

## 2019-05-02 MED ORDER — TRAMADOL HCL 50 MG PO TABS
50.0000 mg | ORAL_TABLET | ORAL | Status: DC | PRN
  Administered 2019-05-02: 100 mg via ORAL
  Administered 2019-05-03: 50 mg via ORAL
  Filled 2019-05-02: qty 1
  Filled 2019-05-02: qty 2

## 2019-05-02 MED ORDER — ACETAMINOPHEN 650 MG RE SUPP: 650.0000 mg | Freq: Four times a day (QID) | RECTAL | Status: DC | PRN

## 2019-05-02 MED ORDER — MIDAZOLAM HCL 2 MG/2ML IJ SOLN
INTRAMUSCULAR | Status: AC
  Filled 2019-05-02: qty 2

## 2019-05-02 MED ORDER — CLOPIDOGREL BISULFATE 75 MG PO TABS
75.0000 mg | ORAL_TABLET | Freq: Every day | ORAL | Status: DC
  Administered 2019-05-03: 75 mg via ORAL
  Filled 2019-05-02: qty 1

## 2019-05-02 MED ORDER — SODIUM CHLORIDE 0.9 % IV SOLN: INTRAVENOUS | Status: AC

## 2019-05-02 MED ORDER — PROPOFOL 500 MG/50ML IV EMUL
INTRAVENOUS | Status: DC | PRN
  Administered 2019-05-02: 75 ug/kg/min via INTRAVENOUS

## 2019-05-02 MED ORDER — SODIUM CHLORIDE 0.9 % IV BOLUS: 500.0000 mL | Freq: Once | INTRAVENOUS | Status: DC

## 2019-05-02 MED ORDER — ACETAMINOPHEN 325 MG PO TABS: 650.0000 mg | ORAL_TABLET | Freq: Four times a day (QID) | ORAL | Status: DC | PRN

## 2019-05-02 MED ORDER — OXYCODONE HCL 5 MG PO TABS: 5.0000 mg | ORAL_TABLET | ORAL | Status: DC | PRN

## 2019-05-02 MED ORDER — LEVOTHYROXINE SODIUM 112 MCG PO TABS
224.0000 ug | ORAL_TABLET | Freq: Every day | ORAL | Status: DC
  Administered 2019-05-03: 224 ug via ORAL
  Filled 2019-05-02: qty 2

## 2019-05-02 MED ORDER — SODIUM CHLORIDE 0.9 % IV SOLN: 250.0000 mL | INTRAVENOUS | Status: DC | PRN

## 2019-05-02 MED ORDER — ASPIRIN 81 MG PO CHEW
81.0000 mg | CHEWABLE_TABLET | Freq: Every day | ORAL | Status: DC
  Administered 2019-05-03: 81 mg via ORAL
  Filled 2019-05-02: qty 1

## 2019-05-02 MED ORDER — SODIUM CHLORIDE 0.9 % IV SOLN: INTRAVENOUS | Status: DC

## 2019-05-02 MED ORDER — SODIUM CHLORIDE 0.9 % IV SOLN
INTRAVENOUS | Status: AC
  Filled 2019-05-02 (×3): qty 1.2

## 2019-05-02 MED ORDER — CHLORHEXIDINE GLUCONATE 4 % EX LIQD: 30.0000 mL | CUTANEOUS | Status: DC

## 2019-05-02 MED ORDER — PHENYLEPHRINE HCL-NACL 20-0.9 MG/250ML-% IV SOLN: 0.0000 ug/min | INTRAVENOUS | Status: DC

## 2019-05-02 MED ORDER — CHLORHEXIDINE GLUCONATE 0.12 % MT SOLN
15.0000 mL | Freq: Once | OROMUCOSAL | Status: AC
  Administered 2019-05-02: 15 mL via OROMUCOSAL

## 2019-05-02 MED ORDER — SODIUM CHLORIDE 0.9% FLUSH
3.0000 mL | Freq: Two times a day (BID) | INTRAVENOUS | Status: DC
  Administered 2019-05-03: 3 mL via INTRAVENOUS

## 2019-05-02 SURGICAL SUPPLY — 84 items
BAG DECANTER FOR FLEXI CONT (MISCELLANEOUS) IMPLANT
BAG SNAP BAND KOVER 36X36 (MISCELLANEOUS) ×3 IMPLANT
BLADE CLIPPER SURG (BLADE) IMPLANT
BLADE OSCILLATING /SAGITTAL (BLADE) IMPLANT
BLADE STERNUM SYSTEM 6 (BLADE) IMPLANT
BLADE SURG 10 STRL SS (BLADE) IMPLANT
CABLE ADAPT CONN TEMP 6FT (ADAPTER) ×3 IMPLANT
CATH DIAG EXPO 6F AL1 (CATHETERS) IMPLANT
CATH DIAG EXPO 6F VENT PIG 145 (CATHETERS) ×6 IMPLANT
CATH EXTERNAL FEMALE PUREWICK (CATHETERS) IMPLANT
CATH INFINITI 6F AL2 (CATHETERS) IMPLANT
CATH S G BIP PACING (CATHETERS) ×3 IMPLANT
CHLORAPREP W/TINT 26 (MISCELLANEOUS) ×3 IMPLANT
CLIP VESOCCLUDE MED 24/CT (CLIP) IMPLANT
CLIP VESOCCLUDE SM WIDE 24/CT (CLIP) IMPLANT
CLOSURE MYNX CONTROL 6F/7F (Vascular Products) ×1 IMPLANT
CNTNR URN SCR LID CUP LEK RST (MISCELLANEOUS) ×4 IMPLANT
CONT SPEC 4OZ STRL OR WHT (MISCELLANEOUS) ×6
COVER BACK TABLE 80X110 HD (DRAPES) ×3 IMPLANT
COVER WAND RF STERILE (DRAPES) ×3 IMPLANT
DECANTER SPIKE VIAL GLASS SM (MISCELLANEOUS) ×3 IMPLANT
DERMABOND ADVANCED (GAUZE/BANDAGES/DRESSINGS) ×1
DERMABOND ADVANCED .7 DNX12 (GAUZE/BANDAGES/DRESSINGS) ×2 IMPLANT
DEVICE CLOSURE PERCLS PRGLD 6F (VASCULAR PRODUCTS) ×4 IMPLANT
DRAPE INCISE IOBAN 66X45 STRL (DRAPES) IMPLANT
DRSG TEGADERM 4X4.75 (GAUZE/BANDAGES/DRESSINGS) ×5 IMPLANT
ELECT CAUTERY BLADE 6.4 (BLADE) IMPLANT
ELECT REM PT RETURN 9FT ADLT (ELECTROSURGICAL) ×6
ELECTRODE REM PT RTRN 9FT ADLT (ELECTROSURGICAL) ×4 IMPLANT
FELT TEFLON 6X6 (MISCELLANEOUS) IMPLANT
GAUZE 4X4 16PLY RFD (DISPOSABLE) ×1 IMPLANT
GAUZE SPONGE 2X2 8PLY STRL LF (GAUZE/BANDAGES/DRESSINGS) IMPLANT
GAUZE SPONGE 4X4 12PLY STRL (GAUZE/BANDAGES/DRESSINGS) ×3 IMPLANT
GLOVE BIO SURGEON STRL SZ7.5 (GLOVE) ×3 IMPLANT
GLOVE BIO SURGEON STRL SZ8 (GLOVE) IMPLANT
GLOVE EUDERMIC 7 POWDERFREE (GLOVE) IMPLANT
GLOVE ORTHO TXT STRL SZ7.5 (GLOVE) IMPLANT
GOWN STRL REUS W/ TWL LRG LVL3 (GOWN DISPOSABLE) IMPLANT
GOWN STRL REUS W/ TWL XL LVL3 (GOWN DISPOSABLE) ×2 IMPLANT
GOWN STRL REUS W/TWL LRG LVL3 (GOWN DISPOSABLE)
GOWN STRL REUS W/TWL XL LVL3 (GOWN DISPOSABLE) ×3
GUIDEWIRE SAFE TJ AMPLATZ EXST (WIRE) ×3 IMPLANT
INSERT FOGARTY SM (MISCELLANEOUS) IMPLANT
KIT BASIN OR (CUSTOM PROCEDURE TRAY) ×3 IMPLANT
KIT HEART LEFT (KITS) ×3 IMPLANT
KIT SUCTION CATH 14FR (SUCTIONS) IMPLANT
KIT TURNOVER KIT B (KITS) ×3 IMPLANT
LOOP VESSEL MAXI BLUE (MISCELLANEOUS) IMPLANT
LOOP VESSEL MINI RED (MISCELLANEOUS) IMPLANT
NS IRRIG 1000ML POUR BTL (IV SOLUTION) ×3 IMPLANT
PACK ENDO MINOR (CUSTOM PROCEDURE TRAY) ×3 IMPLANT
PAD ARMBOARD 7.5X6 YLW CONV (MISCELLANEOUS) ×6 IMPLANT
PAD ELECT DEFIB RADIOL ZOLL (MISCELLANEOUS) ×3 IMPLANT
PENCIL BUTTON HOLSTER BLD 10FT (ELECTRODE) IMPLANT
PERCLOSE PROGLIDE 6F (VASCULAR PRODUCTS) ×6
POSITIONER HEAD DONUT 9IN (MISCELLANEOUS) ×3 IMPLANT
SET MICROPUNCTURE 5F STIFF (MISCELLANEOUS) ×3 IMPLANT
SHEATH BRITE TIP 7FR 35CM (SHEATH) ×3 IMPLANT
SHEATH PINNACLE 6F 10CM (SHEATH) ×3 IMPLANT
SHEATH PINNACLE 8F 10CM (SHEATH) ×3 IMPLANT
SLEEVE REPOSITIONING LENGTH 30 (MISCELLANEOUS) ×3 IMPLANT
SPONGE GAUZE 2X2 STER 10/PKG (GAUZE/BANDAGES/DRESSINGS) ×1
STOPCOCK MORSE 400PSI 3WAY (MISCELLANEOUS) ×6 IMPLANT
SUT ETHIBOND X763 2 0 SH 1 (SUTURE) IMPLANT
SUT GORETEX CV 4 TH 22 36 (SUTURE) IMPLANT
SUT GORETEX CV4 TH-18 (SUTURE) IMPLANT
SUT MNCRL AB 3-0 PS2 18 (SUTURE) IMPLANT
SUT PROLENE 5 0 C 1 36 (SUTURE) IMPLANT
SUT PROLENE 6 0 C 1 30 (SUTURE) IMPLANT
SUT SILK  1 MH (SUTURE) ×3
SUT SILK 1 MH (SUTURE) ×2 IMPLANT
SUT VIC AB 2-0 CT1 27 (SUTURE)
SUT VIC AB 2-0 CT1 TAPERPNT 27 (SUTURE) IMPLANT
SUT VIC AB 2-0 CTX 36 (SUTURE) IMPLANT
SUT VIC AB 3-0 SH 8-18 (SUTURE) IMPLANT
SYR 50ML LL SCALE MARK (SYRINGE) ×3 IMPLANT
SYR BULB IRRIGATION 50ML (SYRINGE) IMPLANT
SYR MEDRAD MARK V 150ML (SYRINGE) ×3 IMPLANT
TOWEL GREEN STERILE (TOWEL DISPOSABLE) ×6 IMPLANT
TRANSDUCER W/STOPCOCK (MISCELLANEOUS) ×6 IMPLANT
TRAY FOLEY SLVR 16FR TEMP STAT (SET/KITS/TRAYS/PACK) IMPLANT
VALVE 26 ULTRA SAPIEN KIT (Valve) ×1 IMPLANT
WIRE EMERALD 3MM-J .035X150CM (WIRE) ×3 IMPLANT
WIRE EMERALD 3MM-J .035X260CM (WIRE) ×3 IMPLANT

## 2019-05-02 NOTE — Anesthesia Procedure Notes (Signed)
Procedure Name: MAC Date/Time: 05/02/2019 7:30 AM Performed by: Griffin Dakin, CRNA Pre-anesthesia Checklist: Patient identified, Emergency Drugs available, Patient being monitored and Suction available Patient Re-evaluated:Patient Re-evaluated prior to induction Oxygen Delivery Method: Simple face mask Induction Type: IV induction Dental Injury: Teeth and Oropharynx as per pre-operative assessment

## 2019-05-02 NOTE — CV Procedure (Signed)
[facility]  TAVR OPERATIVE NOTE   Date of Procedure:  05/02/2019  Preoperative Diagnosis: Severe Aortic Stenosis   Postoperative Diagnosis: Same   Procedure:    Transcatheter Aortic Valve Replacement - Transfemoral Approach  Edwards Sapien 3 THV (size 26 mm, model #[id number], serial # [id number])   Co-Surgeons:  Lauree Chandler, MD and Gaye Pollack, MD   Anesthesiologist:  Smith Robert  Echocardiographer:  Croitoru  Pre-operative Echo Findings:  Severe aortic stenosis  Normal left ventricular systolic function  Post-operative Echo Findings:  No paravalvular leak  Normal left ventricular systolic function  BRIEF CLINICAL NOTE AND INDICATIONS FOR SURGERY  79 yo male with history of CAD, GERD, remote atrial fibrillation, PE, hypothyroidism, TIA November 2020 and severe aortic stenosis. He was seen for neurology consultation in November by Dr.Aroorfor suspected TIA, He had a negative workup for acute stroke with brain MRI and cranial CTA. He was started on aspirin and Plavix, although there was some discussion about anticoagulation given reported remote history of atrial fibrillation and CHA2DS2-VASc score of at least 4. He is now off of Plavix. He had COVID-19 in November and received an outpatientbamlanivimab infusion.Records indicate cardiology consultation through the [facility] system in March 2019 when he was seen by Dr. Tobe Sos.Hewas felt to have moderate aortic stenosis at that time, cleared preoperatively for hip surgery. He was seen by Dr. [name] 01/23/19. Echo 12/06/18 with LVEF=60-65%, mild LVH. The aortic valve leaflets are thickened and calcified with limited leaflet excursion. Mean gradient 53 mmHg, peak gradient 77 mmHg,  AVA 0.57 cm2, dimensionless index of 0.18.Cardiac cath with mild CAD.   During the course of the patient's preoperative work up they have been evaluated comprehensively by a multidisciplinary team of specialists coordinated  through the [facility] in the [facility].  They have been demonstrated to suffer from symptomatic severe aortic stenosis as noted above. The patient has been counseled extensively as to the relative risks and benefits of all options for the treatment of severe aortic stenosis including long term medical therapy, conventional surgery for aortic valve replacement, and transcatheter aortic valve replacement.  The patient has been independently evaluated by Dr. Cyndia Bent with CT surgery and they are felt to be at high risk for conventional surgical aortic valve replacement. The surgeon indicated the patient would be a poor candidate for conventional surgery. Based upon review of all of the patient's preoperative diagnostic tests they are felt to be candidate for transcatheter aortic valve replacement using the transfemoral approach as an alternative to high risk conventional surgery.    Following the decision to proceed with transcatheter aortic valve replacement, a discussion has been held regarding what types of management strategies would be attempted intraoperatively in the event of life-threatening complications, including whether or not the patient would be considered a candidate for the use of cardiopulmonary bypass and/or conversion to open sternotomy for attempted surgical intervention.  The patient has been advised of a variety of complications that might develop peculiar to this approach including but not limited to risks of death, stroke, paravalvular leak, aortic dissection or other major vascular complications, aortic annulus rupture, device embolization, cardiac rupture or perforation, acute myocardial infarction, arrhythmia, heart block or bradycardia requiring permanent pacemaker placement, congestive heart failure, respiratory failure, renal failure, pneumonia, infection, other late complications related to structural valve deterioration or migration,  or other complications that might ultimately cause a temporary or permanent loss of functional independence or other long term morbidity.  The patient provides full informed consent for the procedure as described and all questions were answered preoperatively.    DETAILS OF THE OPERATIVE PROCEDURE  PREPARATION:   The patient is brought to the operating room on the above mentioned date and central monitoring was established by the anesthesia team including placement of a radial arterial line. The patient is placed in the supine position on the operating table.  Intravenous antibiotics are administered. Conscious sedation is used.   Baseline transthoracic echocardiogram was performed. The patient's chest, abdomen, both groins, and both lower extremities are prepared and draped in a sterile manner. A time out procedure is performed.   PERIPHERAL ACCESS:   Using the modified Seldinger technique, femoral arterial and venous access were obtained with placement of 6 Fr sheaths on the left side using u/s guidance.  A pigtail diagnostic catheter was passed through the femoral arterial sheath under fluoroscopic guidance into the aortic root.  A temporary transvenous pacemaker catheter was passed through the femoral venous sheath under fluoroscopic guidance into the right ventricle.  The pacemaker was tested to ensure stable lead placement and pacemaker capture. Aortic root angiography was performed in order to determine the optimal angiographic angle for valve deployment.  TRANSFEMORAL ACCESS:  A micropuncture kit was used to gain access to the right femoral artery using u/s guidance. Position confirmed with angiography. Pre-closure with double ProGlide closure devices. The patient was heparinized systemically and ACT verified > 250 seconds.    A 14 Fr transfemoral E-sheath was introduced into the right femoral artery after progressively dilating over an Amplatz superstiff wire. An AL-2 catheter was used to  direct a straight-tip exchange length wire across the native aortic valve into the left ventricle. This was exchanged out for a pigtail catheter and position was confirmed in the LV apex. Simultaneous LV and Ao pressures were recorded.  The pigtail catheter was then exchanged for an Amplatz Extra-stiff wire in the LV apex.   TRANSCATHETER HEART VALVE DEPLOYMENT:  An Edwards Sapien 3 THV (size 26 mm) was prepared and crimped per manufacturer's guidelines, and the proper orientation of the valve is confirmed on the [employer] delivery system. The valve was advanced through the introducer sheath using normal technique until in an appropriate position in the abdominal aorta beyond the sheath tip. The balloon was then retracted and using the fine-tuning wheel was centered on the valve. The valve was then advanced across the aortic arch using appropriate flexion of the catheter. The valve was carefully positioned across the aortic valve annulus. The Commander catheter was retracted using normal technique. Once final position of the valve has been confirmed by angiographic assessment, the valve is deployed while temporarily holding ventilation and during rapid ventricular pacing to maintain systolic blood pressure < 50 mmHg and pulse pressure < 10 mmHg. The balloon inflation is held for >3 seconds after reaching full deployment volume. Once the balloon has fully deflated the balloon is retracted into the ascending aorta and valve function is assessed using TTE. There is felt to be no paravalvular leak and no central aortic insufficiency.  The patient's hemodynamic recovery following valve deployment is good.  The deployment balloon and guidewire are both removed. Echo demostrated acceptable post-procedural gradients, stable mitral valve function, and no AI.   PROCEDURE COMPLETION:  The sheath was then removed and closure devices were completed. Protamine was administered once femoral arterial repair was  complete. The temporary pacemaker, pigtail catheters and femoral sheaths were removed with placement of a Mynx  closure device in the right femoral artery. Manual pressure used for hemostasis of the right femoral vein.    The patient tolerated the procedure well and is transported to the surgical intensive care in stable condition. There were no immediate intraoperative complications. All sponge instrument and needle counts are verified correct at completion of the operation.   No blood products were administered during the operation.  The patient received a total of 120 mL of intravenous contrast during the procedure.  Lauree Chandler MD 05/02/2019 9:32 AM

## 2019-05-02 NOTE — Anesthesia Postprocedure Evaluation (Signed)
Anesthesia Post Note  Patient: Andrew Watson  Procedure(s) Performed: TRANSCATHETER AORTIC VALVE REPLACEMENT, TRANSFEMORAL (N/A Chest) Intraoperative Transthoracic Echocardiogram (N/A Chest)     Patient location during evaluation: PACU Anesthesia Type: MAC Level of consciousness: awake and alert Pain management: pain level controlled Vital Signs Assessment: post-procedure vital signs reviewed and stable Respiratory status: spontaneous breathing, nonlabored ventilation, respiratory function stable and patient connected to nasal cannula oxygen Cardiovascular status: stable and blood pressure returned to baseline Postop Assessment: no apparent nausea or vomiting Anesthetic complications: no    Last Vitals:  Vitals:   05/02/19 1215 05/02/19 1300  BP: 111/68 (!) 114/59  Pulse: 63 65  Resp: (!) 23 15  Temp:  (!) 36.3 C  SpO2: 98% 97%    Last Pain:  Vitals:   05/02/19 1239  TempSrc:   PainSc: 0-No pain                 Effie Berkshire

## 2019-05-02 NOTE — Progress Notes (Signed)
  [facility]   MULTIDISCIPLINARY HEART VALVE TEAM  Patient doing well s/p TAVR. He is hemodynamically stable. Groin sites stable. ECG with sinus and 1st deg AV block but no high grade block. Arterial line discontinued and transferred to 4E. Plan for early ambulation after bedrest completed and hopeful discharge over the next 24-48 hours.   Hernandez Losasso PA-C  [facility]  Pager [phone]  

## 2019-05-02 NOTE — Op Note (Signed)
[facility]   MULTIDISCIPLINARY HEART VALVE TEAM   TAVR OPERATIVE NOTE   Date of Procedure:  05/02/2019  Preoperative Diagnosis: Severe Aortic Stenosis   Postoperative Diagnosis: Same   Procedure:    Transcatheter Aortic Valve Replacement - Percutaneous Right Transfemoral Approach  Edwards Sapien 3 Ultra THV (size 26 mm, model # 9750TFX, serial # [id number])   Co-Surgeons: Gaye Pollack, MD and Lauree Chandler, MD  Anesthesiologist:  Wilfrid Lund, MD  Echocardiographer:  Jerilynn Mages. Croitoru, MD  Pre-operative Echo Findings:  Severe aortic stenosis  Normal left ventricular systolic function  Post-operative Echo Findings:  No paravalvular leak  Normal left ventricular systolic function   BRIEF CLINICAL NOTE AND INDICATIONS FOR SURGERY   This patient is a 79 year old gentleman with stage D, severe, symptomatic aortic stenosis with New York Heart Association class III symptoms of exertional fatigue and shortness of breath consistent with chronic diastolic congestive heart failure. I have personally reviewed his 2D echocardiogram, cardiac catheterization, and CTA studies. His echocardiogram shows a severely calcified and thickened aortic valve with restricted mobility of the leaflets. The mean gradient across the aortic valve is 50.6 mmHg with a calculated aortic valve area of 0.57 cm. Left ventricular ejection fraction is 60 to 65%. Cardiac catheterization shows mild nonobstructive coronary disease. The mean gradient across the aortic valve is measured at 60 mmHg consistent with severe aortic stenosis. I agree that aortic valve replacement is indicated in this patient for improvement of symptoms and prevent progressive left ventricular deterioration. Given his advanced age I think transcatheter aortic valve replacement would be the best treatment for him. His gated cardiac CTA shows anatomy suitable for TAVR using a SAPIEN 3 valve. His abdominal and pelvic CTA shows  adequate pelvic vascular anatomy to allow transfemoral insertion. His abdominal and pelvic CTA also showed a 7 to 10 mm obstructing left ureteral calculus with left kidney hydronephrosis. This was treated by Dr. Jeffie Pollock on 03/30/2019 with left ureteroscopic stone extraction and laser lithotripsy and insertion of a left double-J stent. He has been seen back and had the stent removed.   The patient and his significant other were counseled at length regarding treatment alternatives for management of severe symptomatic aortic stenosis. The risks and benefits of surgical intervention has been discussed in detail. Long-term prognosis with medical therapy was discussed. Alternative approaches such as conventional surgical aortic valve replacement, transcatheter aortic valve replacement, and palliative medical therapy were compared and contrasted at length. This discussion was placed in the context of the patient's own specific clinical presentation and past medical history. All of their questions have been addressed.   Following the decision to proceed with transcatheter aortic valve replacement, a discussion was held regarding what types of management strategies would be attempted intraoperatively in the event of life-threatening complications, including whether or not the patient would be considered a candidate for the use of cardiopulmonary bypass and/or conversion to open sternotomy for attempted surgical intervention. He is 79 years old but a low risk surgical patient and I think he would be a candidate for emergent sternotomy if needed to manage any intraoperative complications. The patient has been advised of a variety of complications that might develop including but not limited to risks of death, stroke, paravalvular leak, aortic dissection or other major vascular complications, aortic annulus rupture, device embolization, cardiac rupture or perforation, mitral regurgitation, acute myocardial infarction,  arrhythmia, heart block or bradycardia requiring permanent pacemaker placement, congestive heart failure, respiratory failure, renal failure, pneumonia, infection, other  late complications related to structural valve deterioration or migration, or other complications that might ultimately cause a temporary or permanent loss of functional independence or other long term morbidity. The patient provides full informed consent for the procedure as described and all questions were answered.     DETAILS OF THE OPERATIVE PROCEDURE  PREPARATION:    The patient is brought to the operating room on the above mentioned date and appropriate monitoring was established by the anesthesia team. The patient is placed in the supine position on the operating table.  Intravenous antibiotics are administered. The patient is monitored closely throughout the procedure under conscious sedation.  Baseline transthoracic echocardiogram was performed. The patient's abdomen and both groins are prepared and draped in a sterile manner. A time out procedure is performed.   PERIPHERAL ACCESS:    Using the modified Seldinger technique, femoral arterial and venous access was obtained with placement of 6 Fr sheaths on the left side.  A pigtail diagnostic catheter was passed through the left arterial sheath under fluoroscopic guidance into the aortic root.  A temporary transvenous pacemaker catheter was passed through the left femoral venous sheath under fluoroscopic guidance into the right ventricle.  The pacemaker was tested to ensure stable lead placement and pacemaker capture. Aortic root angiography was performed in order to determine the optimal angiographic angle for valve deployment.   TRANSFEMORAL ACCESS:   Percutaneous transfemoral access and sheath placement was performed using ultrasound guidance.  The right common femoral artery was cannulated using a micropuncture needle and appropriate location was verified using hand  injection angiogram.  A pair of Abbott Perclose percutaneous closure devices were placed and a 6 French sheath replaced into the femoral artery.  The patient was heparinized systemically and ACT verified > 250 seconds.    A 14 Fr transfemoral E-sheath was introduced into the right common femoral artery after progressively dilating over an Amplatz superstiff wire. An AL-2 catheter was used to direct a straight-tip exchange length wire across the native aortic valve into the left ventricle. This was exchanged out for a pigtail catheter and position was confirmed in the LV apex. Simultaneous LV and Ao pressures were recorded.  The pigtail catheter was exchanged for an Amplatz Extra-stiff wire in the LV apex.     BALLOON AORTIC VALVULOPLASTY:   Not performed   TRANSCATHETER HEART VALVE DEPLOYMENT:   An Edwards Sapien 3 Ultra transcatheter heart valve (size 26 mm, model #[id number], serial [id number]) was prepared and crimped per manufacturer's guidelines, and the proper orientation of the valve is confirmed on the [employer] delivery system. The valve was advanced through the introducer sheath using normal technique until in an appropriate position in the abdominal aorta beyond the sheath tip. The balloon was then retracted and using the fine-tuning wheel was centered on the valve. The valve was then advanced across the aortic arch using appropriate flexion of the catheter. The valve was carefully positioned across the aortic valve annulus. The Commander catheter was retracted using normal technique. Once final position of the valve has been confirmed by angiographic assessment, the valve is deployed while temporarily holding ventilation and during rapid ventricular pacing to maintain systolic blood pressure < 50 mmHg and pulse pressure < 10 mmHg. The balloon inflation is held for >3 seconds after reaching full deployment volume. Once the balloon has fully deflated the balloon is retracted into the  ascending aorta and valve function is assessed using echocardiography. There is felt to be no paravalvular leak and no  central aortic insufficiency.  The patient's hemodynamic recovery following valve deployment is good.  The deployment balloon and guidewire are both removed.    PROCEDURE COMPLETION:   The sheath was removed and femoral artery closure performed.  Protamine was administered once femoral arterial repair was complete. The temporary pacemaker, pigtail catheters and femoral sheaths were removed with manual pressure used for hemostasis.  A Mynx femoral closure device was utilized following removal of the diagnostic sheath in the left femoral artery.  The patient tolerated the procedure well and is transported to the cath lab recovery area in stable condition. There were no immediate intraoperative complications. All sponge instrument and needle counts are verified correct at completion of the operation.   No blood products were administered during the operation.  The patient received a total of 120 mL of intravenous contrast during the procedure.   Gaye Pollack, MD 05/02/2019 12:50 PM

## 2019-05-02 NOTE — Transfer of Care (Signed)
Immediate Anesthesia Transfer of Care Note  Patient: Andrew Watson  Procedure(s) Performed: TRANSCATHETER AORTIC VALVE REPLACEMENT, TRANSFEMORAL (N/A Chest) Intraoperative Transthoracic Echocardiogram (N/A Chest)  Patient Location: Cath Lab  Anesthesia Type:MAC  Level of Consciousness: awake, alert  and oriented  Airway & Oxygen Therapy: Patient Spontanous Breathing and Patient connected to nasal cannula oxygen  Post-op Assessment: Report given to RN and Post -op Vital signs reviewed and stable  Post vital signs: Reviewed and stable  Last Vitals:  Vitals Value Taken Time  BP    Temp 36.9 C 05/02/19 0945  Pulse    Resp    SpO2      Last Pain:  Vitals:   05/02/19 0945  TempSrc: Temporal  PainSc: 0-No pain         Complications: No apparent anesthesia complications

## 2019-05-02 NOTE — Anesthesia Procedure Notes (Signed)
Arterial Line Insertion Start/End4/06/2019 7:10 AM, 05/02/2019 7:15 AM Performed by: Effie Berkshire, MD, Griffin Dakin, CRNA, CRNA  Patient location: Pre-op. Preanesthetic checklist: patient identified, IV checked, site marked, risks and benefits discussed, surgical consent, monitors and equipment checked, pre-op evaluation, timeout performed and anesthesia consent Lidocaine 1% used for infiltration Right, radial was placed Catheter size: 20 Fr Hand hygiene performed  and maximum sterile barriers used   Attempts: 1 Procedure performed without using ultrasound guided technique. Following insertion, dressing applied and Biopatch. Post procedure assessment: normal and unchanged  Patient tolerated the procedure well with no immediate complications.

## 2019-05-02 NOTE — Progress Notes (Signed)
Patient arrived from [facility] lab to 4east 15. Patient placed on monitor and vital signs obtained. Patient with call bell in reach. Bilateral groins level zero. CHG done. Will continue to monitor patient. Jontavious Commons, Bettina Gavia RN

## 2019-05-02 NOTE — Interval H&P Note (Signed)
History and Physical Interval Note:  05/02/2019 6:39 AM  Andrew Watson  has presented today for surgery, with the diagnosis of Severe Aortic Stenosis.  The various methods of treatment have been discussed with the patient and family. After consideration of risks, benefits and other options for treatment, the patient has consented to  Procedure(s): TRANSCATHETER AORTIC VALVE REPLACEMENT, TRANSFEMORAL (N/A) TRANSESOPHAGEAL ECHOCARDIOGRAM (TEE) (N/A) as a surgical intervention.  The patient's history has been reviewed, patient examined, no change in status, stable for surgery.  I have reviewed the patient's chart and labs.  Questions were answered to the patient's satisfaction.     Gaye Pollack

## 2019-05-02 NOTE — Discharge Instructions (Signed)
ACTIVITY AND EXERCISE °• Daily activity and exercise are an important part of your recovery. People recover at different rates depending on their general health and type of valve procedure. °• Most people recovering from TAVR feel better relatively quickly  °• No lifting, pushing, pulling more than 10 pounds (examples to avoid: groceries, vacuuming, gardening, golfing): °            - For one week with a procedure through the groin. °            - For six weeks for procedures through the chest wall or neck. °NOTE: You will typically see one of our providers 7-14 days after your procedure to discuss WHEN TO RESUME the above activities.  °  °  °DRIVING °• Do not drive until you are seen for follow up and cleared by a provider. Generally, we ask patient to not drive for 1 week after their procedure. °• If you have been told by your doctor in the past that you may not drive, you must talk with him/her before you begin driving again. °  °DRESSING °• Groin site: you may leave the clear dressing over the site for up to one week or until it falls off. °  °HYGIENE °• If you had a femoral (leg) procedure, you may take a shower when you return home. After the shower, pat the site dry. Do NOT use powder, oils or lotions in your groin area until the site has completely healed. °• If you had a chest procedure, you may shower when you return home unless specifically instructed not to by your discharging practitioner. °            - DO NOT scrub incision; pat dry with a towel. °            - DO NOT apply any lotions, oils, powders to the incision. °            - No tub baths / swimming for at least 2 weeks. °• If you notice any fevers, chills, increased pain, swelling, bleeding or pus, please contact your doctor. °  °ADDITIONAL INFORMATION °• If you are going to have an upcoming dental procedure, please contact our office as you will require antibiotics ahead of time to prevent infection on your heart valve.  ° ° °If you have any  questions or concerns you can call the structural heart phone during normal business hours 8am-4pm. If you have an urgent need after hours or weekends please call [phone] to talk to the on call provider for general cardiology. If you have an emergency that requires immediate attention, please call 911.  ° ° °After TAVR Checklist ° °Check  Test Description  ° Follow up appointment in 1-2 weeks  You will see our structural heart physician assistant, Katie Mailyn Steichen. Your incision sites will be checked and you will be cleared to drive and resume all normal activities if you are doing well.    ° 1 month echo and follow up  You will have an echo to check on your new heart valve and be seen back in the office by Katie Sonda Coppens. Many times the echo is not read by your appointment time, but Katie will call you later that day or the following day to report your results.  ° Follow up with your primary cardiologist You will need to be seen by your primary cardiologist in the following 3-6 months after your 1 month appointment in the valve   clinic. Often times your Plavix or Aspirin will be discontinued during this time, but this is decided on a case by case basis.   ° 1 year echo and follow up You will have another echo to check on your heart valve after 1 year and be seen back in the office by Katie Bethannie Iglehart. This your last structural heart visit.  ° Bacterial endocarditis prophylaxis  You will have to take antibiotics for the rest of your life before all dental procedures (even teeth cleanings) to protect your heart valve. Antibiotics are also required before some surgeries. Please check with your cardiologist before scheduling any surgeries. Also, please make sure to tell us if you have a penicillin allergy as you will require an alternative antibiotic.   ° ° °

## 2019-05-02 NOTE — Progress Notes (Signed)
MOBILITY TEAM - Progress Note   05/02/19 1654  Mobility  Activity Ambulated in hall;Ambulated to bathroom  Level of Assistance Independent  Assistive Device None  Distance Ambulated (ft) 1200 ft  Mobility Response Tolerated well  Mobility performed by Mobility specialist  Bed Position Semi-fowlers   Patient motivated to participate, moving independently.  Mabeline Caras, PT, DPT Mobility Team Pager [phone]

## 2019-05-02 NOTE — Anesthesia Preprocedure Evaluation (Addendum)
Anesthesia Evaluation  Patient identified by MRN, date of birth, ID band Patient awake    Reviewed: Allergy & Precautions, NPO status , Patient's Chart, lab work & pertinent test results  Airway Mallampati: I  TM Distance: >3 FB Neck ROM: Full    Dental  (+) Teeth Intact, Chipped, Dental Advisory Given,    Pulmonary    breath sounds clear to auscultation       Cardiovascular + CAD   Rhythm:Regular Rate:Normal + Systolic murmurs    Neuro/Psych TIAnegative psych ROS   GI/Hepatic Neg liver ROS, GERD  ,  Endo/Other  Hypothyroidism   Renal/GU Renal disease     Musculoskeletal  (+) Arthritis ,   Abdominal Normal abdominal exam  (+)   Peds  Hematology   Anesthesia Other Findings   Reproductive/Obstetrics                           Echo:  1. Left ventricular ejection fraction, by visual estimation, is 60 to  65%. The left ventricle has normal function. There is mildly increased  left ventricular hypertrophy.  2. The left ventricle has no regional wall motion abnormalities.  3. Definity contrast agent was given IV to delineate the left ventricular  endocardial borders.  4. Left ventricular diastolic parameters are consistent with Grade I  diastolic dysfunction (impaired relaxation).  5. Global right ventricle has normal systolic function.The right  ventricular size is normal. No increase in right ventricular wall  thickness.  6. Left atrial size was normal.  7. Right atrial size was normal.  8. Mild to moderate mitral annular calcification.  9. The mitral valve is normal in structure. No evidence of mitral valve  regurgitation. No evidence of mitral stenosis.  10. The tricuspid valve is normal in structure. Tricuspid valve  regurgitation is trivial.  11. The aortic valve is tricuspid. Aortic valve regurgitation is not  visualized. Severe aortic valve stenosis. Mean gradient 53 mmHg  with AVA  0.57 cm^2.  12. The inferior vena cava is normal in size with greater than 50%  respiratory variability, suggesting right atrial pressure of 3 mmHg.  13. The tricuspid regurgitant velocity is 2.47 m/s, and with an assumed  right atrial pressure of 3 mmHg, the estimated right ventricular systolic  pressure is normal at 27.4 mmHg.   Anesthesia Physical Anesthesia Plan  ASA: IV  Anesthesia Plan: MAC   Post-op Pain Management:    Induction: Intravenous  PONV Risk Score and Plan: 2 and Ondansetron and Propofol infusion  Airway Management Planned: Natural Airway and Simple Face Mask  Additional Equipment: Arterial line  Intra-op Plan:   Post-operative Plan:   Informed Consent: I have reviewed the patients History and Physical, chart, labs and discussed the procedure including the risks, benefits and alternatives for the proposed anesthesia with the patient or authorized representative who has indicated his/her understanding and acceptance.       Plan Discussed with: CRNA  Anesthesia Plan Comments:        Anesthesia Quick Evaluation

## 2019-05-02 NOTE — Progress Notes (Signed)
Patient ambulated in hallway with hospital staff. Will monitor patient. Hoang Reich, Bettina Gavia RN

## 2019-05-02 NOTE — Progress Notes (Signed)
  Echocardiogram 2D Echocardiogram has been performed.  Andrew Watson 05/02/2019, 9:16 AM

## 2019-05-03 ENCOUNTER — Encounter: Payer: Self-pay | Admitting: *Deleted

## 2019-05-03 ENCOUNTER — Inpatient Hospital Stay (HOSPITAL_COMMUNITY): Payer: Medicare Other

## 2019-05-03 ENCOUNTER — Other Ambulatory Visit: Payer: Self-pay | Admitting: Physician Assistant

## 2019-05-03 DIAGNOSIS — Z952 Presence of prosthetic heart valve: Secondary | ICD-10-CM

## 2019-05-03 DIAGNOSIS — I350 Nonrheumatic aortic (valve) stenosis: Principal | ICD-10-CM

## 2019-05-03 LAB — CBC
HCT: 33.5 % — ABNORMAL LOW (ref 39.0–52.0)
Hemoglobin: 10.7 g/dL — ABNORMAL LOW (ref 13.0–17.0)
MCH: 29.5 pg (ref 26.0–34.0)
MCHC: 31.9 g/dL (ref 30.0–36.0)
MCV: 92.3 fL (ref 80.0–100.0)
Platelets: 110 10*3/uL — ABNORMAL LOW (ref 150–400)
RBC: 3.63 MIL/uL — ABNORMAL LOW (ref 4.22–5.81)
RDW: 12.9 % (ref 11.5–15.5)
WBC: 5.1 10*3/uL (ref 4.0–10.5)
nRBC: 0 % (ref 0.0–0.2)

## 2019-05-03 LAB — BASIC METABOLIC PANEL
Anion gap: 7 (ref 5–15)
BUN: 17 mg/dL (ref 8–23)
CO2: 24 mmol/L (ref 22–32)
Calcium: 8.6 mg/dL — ABNORMAL LOW (ref 8.9–10.3)
Chloride: 107 mmol/L (ref 98–111)
Creatinine, Ser: 1.01 mg/dL (ref 0.61–1.24)
GFR calc Af Amer: >60 mL/min (ref >60)
GFR calc non Af Amer: >60 mL/min (ref >60)
Glucose, Bld: 104 mg/dL — ABNORMAL HIGH (ref 70–99)
Potassium: 4.4 mmol/L (ref 3.5–5.1)
Sodium: 138 mmol/L (ref 135–145)

## 2019-05-03 LAB — MAGNESIUM: Magnesium: 1.8 mg/dL (ref 1.7–2.4)

## 2019-05-03 MED ORDER — CLOPIDOGREL BISULFATE 75 MG PO TABS: 75.0000 mg | ORAL_TABLET | Freq: Every day | ORAL | 1 refills | Status: DC

## 2019-05-03 NOTE — Progress Notes (Signed)
CARDIAC REHAB PHASE I   PRE:  Rate/Rhythm: 80 SR    BP: sitting 113/61    SaO2:   MODE:  Ambulation: 990 ft   POST:  Rate/Rhythm: 108 ST    BP: sitting 122/60     SaO2: 94 RA  Pt feeling well. Able to walk long distance although did feel slightly SOB (maybe due to mask). HR up to 108 ST, VSS otherwise. To recliner. Discussed exercise, restrictions. Pt declined CRPII.  [id number]   Darrick Meigs CES, ACSM 05/03/2019 8:59 AM

## 2019-05-03 NOTE — Progress Notes (Signed)
Progress Note  Patient Name: Andrew Watson Date of Encounter: 05/03/2019  Primary Cardiologist: [name] TAVR: Angelena Form  Subjective   No chest pain or dyspnea this am.   Inpatient Medications    Scheduled Meds:  aspirin  81 mg Oral Daily   atorvastatin  10 mg Oral Daily   clopidogrel  75 mg Oral Q breakfast   levothyroxine  224 mcg Oral QAC breakfast   sodium chloride flush  3 mL Intravenous Q12H   Continuous Infusions:  sodium chloride     cefUROXime (ZINACEF)  IV 1.5 g (05/03/19 0511)   nitroGLYCERIN     phenylephrine (NEO-SYNEPHRINE) Adult infusion     sodium chloride 999 mL/hr at 05/02/19 1048   PRN Meds: sodium chloride, acetaminophen **OR** acetaminophen, morphine injection, ondansetron (ZOFRAN) IV, oxyCODONE, sodium chloride flush, traMADol   Vital Signs    Vitals:   05/02/19 1442 05/02/19 2018 05/03/19 0003 05/03/19 0419  BP: (!) 122/56 115/65 114/78 112/68  Pulse: 71 77 92   Resp: (!) 23 16 20 18   Temp: 97.6 F (36.4 C) 98.5 F (36.9 C) 98.4 F (36.9 C) 97.8 F (36.6 C)  TempSrc: Oral Oral Oral Oral  SpO2: 98% 98% 95% 100%  Weight:    98.6 kg  Height:        Intake/Output Summary (Last 24 hours) at 05/03/2019 0715 Last data filed at 05/02/2019 2242 Gross per 24 hour  Intake 1353.25 ml  Output 400 ml  Net 953.25 ml   Last 3 Weights 05/03/2019 05/02/2019 04/28/2019  Weight (lbs) 217 lb 6.4 oz 218 lb 4.1 oz 218 lb  Weight (kg) 98.612 kg 99 kg 98.884 kg      Telemetry    sinus - Personally Reviewed  ECG    NSR - Personally Reviewed  Physical Exam   GEN: No acute distress.   Neck: No JVD Cardiac: RRR, soft systolic murmur.   Respiratory: Clear to auscultation bilaterally. GI: Soft, nontender, non-distended  MS: No edema; No deformity.Groins stable.  Neuro:  Nonfocal  Psych: Normal affect   Labs    High Sensitivity Troponin:  No results for input(s): TROPONINIHS in the last 720 hours.    Chemistry Recent Labs  Lab  04/28/19 1000 05/02/19 0745 05/02/19 0923 05/02/19 0954 05/03/19 0245  NA 139   < > 141 141 138  K 4.6   < > 4.5 4.5 4.4  CL 108   < > 106 106 107  CO2 23  --   --   --  24  GLUCOSE 94   < > 110* 103* 104*  BUN 18   < > 18 18 17   CREATININE 0.91   < > 0.90 0.90 1.01  CALCIUM 9.3  --   --   --  8.6*  PROT 6.7  --   --   --   --   ALBUMIN 3.7  --   --   --   --   AST 21  --   --   --   --   ALT 22  --   --   --   --   ALKPHOS 42  --   --   --   --   BILITOT 1.1  --   --   --   --   GFRNONAA >60  --   --   --  >60  GFRAA >60  --   --   --  >60  ANIONGAP 8  --   --   --  7   < > = values in this interval not displayed.     Hematology Recent Labs  Lab 04/28/19 1000 05/02/19 0745 05/02/19 0923 05/02/19 0954 05/03/19 0245  WBC 4.1  --   --   --  5.1  RBC 4.18*  --   --   --  3.63*  HGB 12.5*   < > 10.5* 10.5* 10.7*  HCT 38.2*   < > 31.0* 31.0* 33.5*  MCV 91.4  --   --   --  92.3  MCH 29.9  --   --   --  29.5  MCHC 32.7  --   --   --  31.9  RDW 12.9  --   --   --  12.9  PLT 126*  --   --   --  110*   < > = values in this interval not displayed.    BNP Recent Labs  Lab 04/28/19 1000  BNP 213.3*     DDimer No results for input(s): DDIMER in the last 168 hours.   Radiology    ECHOCARDIOGRAM LIMITED  Result Date: 05/02/2019    ECHOCARDIOGRAM LIMITED REPORT   Patient Name:   Andrew Watson Date of Exam: 05/02/2019 Medical Rec #:  [id number]         Height:       70.0 in Accession #:    [id number]        Weight:       218.3 lb Date of Birth:  03/10/1940         BSA:          2.166 m Patient Age:    79 years          BP:           111/60 mmHg Patient Gender: M                 HR:           73 bpm. Exam Location:  Inpatient Procedure: Limited Echo, Cardiac Doppler and Color Doppler Indications:     Aortic stenosis  History:         Patient has prior history of Echocardiogram examinations, most                  recent 12/06/2018. CAD, TIA, Aortic Valve Disease,                   Arrythmias:Atrial Fibrillation; Signs/Symptoms:Pulmonary                  embolus.                  Aortic Valve: 26 mm Edwards Edwards Sapien prosthetic, stented                  (TAVR) valve is present in the aortic position. Procedure Date:                  05/02/2019.  Sonographer:     Dustin Flock Referring Phys:  [place] Diagnosing Phys: Sanda Klein MD                   PRE-PROCEDURAL FINDINGS:                   Normal left ventricular systolic function. Estimated LVEF                  60-65%.  There are no regional wall motion abnormalities.                  Severe calcific aortic stenosis. Trileaflet aortic valve.                  Peak aortic valve gradient 66 mm Hg, mean gradient 34 mm Hg.                  Dimensionless obstructive index 0.2, calculated aortic valve                  area is 0.6 cm.                  No aortic insufficiency.                  No pericardial effusion.                   POST-PROCEDURAL FINDINGS:                   Hyperdynamic left ventricular systolic function. Estimated LVEF                  65-70%.                  There are no regional wall motion abnormalities.                  Well seated TAVR stent valve.                  Peak aortic valve gradient 10 mm Hg, mean gradient 6 mm Hg.                  Dimensionless obstructive index 0.5, calculated aortic valve                  area is 1.77 cm.                  There is no aortic insufficiency and no perivalvular leak.                  No mitral insufficiency is seen.                  No pericardial effusion. IMPRESSIONS  1. There is a 26 mm Edwards Edwards Sapien prosthetic (TAVR) valve present in the aortic position. Procedure Date: 05/02/2019. FINDINGS  Aortic Valve: Aortic valve mean gradient measures 6.0 mmHg. Aortic valve peak gradient measures 10.5 mmHg. Aortic valve area, by VTI measures 1.72 cm. There is a 26 mm Edwards Edwards Sapien prosthetic, stented (TAVR) valve  present in the aortic position. Procedure Date: 05/02/2019.  LEFT VENTRICLE PLAX 2D LVOT diam:     2.10 cm LV SV:         63 LV SV Index:   29 LVOT Area:     3.46 cm  AORTIC VALVE AV Area (Vmax):    1.99 cm AV Area (Vmean):   1.93 cm AV Area (VTI):     1.72 cm AV Vmax:           162.00 cm/s AV Vmean:          109.000 cm/s AV VTI:            0.368 m AV Peak Grad:      10.5 mmHg AV Mean Grad:      6.0 mmHg LVOT Vmax:  92.96 cm/s LVOT Vmean:        60.885 cm/s LVOT VTI:          0.183 m LVOT/AV VTI ratio: 0.50  SHUNTS Systemic VTI:  0.18 m Systemic Diam: 2.10 cm Sanda Klein MD Electronically signed by Sanda Klein MD Signature Date/Time: 05/02/2019/10:05:26 AM    Final    Structural Heart Procedure  Result Date: 05/02/2019 See surgical note for result.  HYBRID OR IMAGING ([facility] ONLY)  Result Date: 05/02/2019 There is no interpretation for this exam.  This order is for images obtained during a surgical procedure.  Please See "Surgeries" Tab for more information regarding the procedure.    Cardiac Studies     Patient Profile     79 y.o. male with severe aortic stenosis, now s/p TAVR on 05/02/19.   Assessment & Plan    1. Severe aortic stenosis: He is one day post TAVR with placement of a 26 mm Edwards Sapien 3 Ultra valve from the right transfemoral approach on 05/02/19. He is doing well. BP stable. Sinus on tele. No heart block. H/H stable. Groins without hematoma. Plan for echo this am and discharge home later today if stable.   For questions or updates, please contact [facility] Please consult [email or web address] for contact info under        Signed, Lauree Chandler, MD  05/03/2019, 7:15 AM

## 2019-05-03 NOTE — Progress Notes (Signed)
  Echocardiogram 2D Echocardiogram has been performed.  Andrew Watson 05/03/2019, 10:42 AM

## 2019-05-03 NOTE — Progress Notes (Signed)
Patient and family  Given discharge instructions medication list and personal prescriptions sent to personal pharmacy. Follow up appointments also given. IV and tele were dcd. And will discharge home as ordered. All questions were answered will be transported to exit via wheel chair and hospital staff. Jguadalupe Opiela, Bettina Gavia RN

## 2019-05-03 NOTE — Plan of Care (Signed)
Continue to monitor

## 2019-05-03 NOTE — Discharge Summary (Addendum)
[place] VALVE TEAM  Discharge Summary    Patient ID: Andrew Watson MRN: [id number]; DOB: [birth year]  Admit date: 05/02/2019 Discharge date: 05/03/2019  Primary Care Provider: Antony Contras, MD  Primary Cardiologist: Dr. [name] / Dr. Angelena Form & Dr. Cyndia Bent (TAVR)  Discharge Diagnoses    Principal Problem:   S/P TAVR (transcatheter aortic valve replacement) Active Problems:   Hypothyroidism   H/O bypass gastrojejunostomy   Aortic stenosis, severe   Calculus of kidney   History of TIA (transient ischemic attack)   Acute on chronic diastolic heart failure (HCC)   Allergies No Known Allergies  Diagnostic Studies/Procedures      TAVR OPERATIVE NOTE     Date of Procedure:                05/02/2019   Preoperative Diagnosis:      Severe Aortic Stenosis    Postoperative Diagnosis:    Same    Procedure:        Transcatheter Aortic Valve Replacement - Percutaneous Right Transfemoral Approach             Edwards Sapien 3 Ultra THV (size 26 mm, model # 9750TFX, serial # [id number])              Co-Surgeons:            Gaye Pollack, MD and Lauree Chandler, MD   Anesthesiologist:                  Wilfrid Lund, MD   Echocardiographer:              Bertrum Sol, MD   Pre-operative Echo Findings: Severe aortic stenosis Normal left ventricular systolic function   Post-operative Echo Findings: No paravalvular leak Normal left ventricular systolic function _____________    Echo 05/03/19: complete but pending formal read at the time of discharge    History of Present Illness     Andrew Watson is a 79 y.o. male with a history of GERD, TIA (11/2018), remote atrial fibrillation (not on [facility]), renal stone with hydronephrosis requiring ureteroscopic stone extraction/stenting, history of PE, hypothyroidism, and severe aortic stenosis who presented to [facility] on 05/02/19 for planned TAVR.  He had an echo in April 2013 which showed a  mildly calcified aortic valve with a mean gradient of 15 mmHg.  He had another echocardiogram in March 2019 at [facility] when he underwent preoperative cardiology evaluation before hip replacement.  This reportedly showed moderate aortic stenosis and he underwent hip replacement without difficulty.  He had a repeat echocardiogram on 12/06/2018 which showed progression to severe aortic stenosis with a mean gradient of 53 mmHg and peak gradient of 77 mmHg.  Aortic valve area was 0.57 cm with a dimensionless index of 0.18.  The aortic valve was severely thickened and calcified with restricted mobility of the leaflets. Diagnostic cath 02/28/19 showed mild non obst CAD and confirmed severe AS. Pre TAVR CT scans showed a 7 x 13 mm left UPJ stone with obstruction . This was treated by Dr. Jeffie Pollock.   The patient has been evaluated by the multidisciplinary valve team and felt to have severe, symptomatic aortic stenosis and to be a suitable candidate for TAVR, which was set up for 05/02/19.    Hospital Course     Consultants: none   Severe AS: s/p successful TAVR with a 26 mm Edwards Sapien 3 THV via the TF approach on 05/02/19. He could tell  a big difference in his breathing immediately. Post operative echo completed but pending formal read. Groin sites are stable. ECG with sinus and no high grade heart block. Continue Asprin and started on Plavix 75 mg daily. I will see him in the office next week for close follow up.   Acute on chronic diastolic CHF: as evidenced by an elevated BNP on pre admission lab work and progressive NYHA class III symptoms of dyspnea and fatigue. This has been treated with TAVR.   _____________  Discharge Vitals Blood pressure 122/60, pulse 92, temperature 98 F (36.7 C), temperature source Oral, resp. rate 20, height 5\' 10"  (1.778 m), weight 98.6 kg, SpO2 96 %.  Filed Weights   05/02/19 0625 05/03/19 0419  Weight: 99 kg 98.6 kg    Labs & Radiologic Studies    CBC Recent Labs     05/02/19 0954 05/03/19 0245  WBC  --  5.1  HGB 10.5* 10.7*  HCT 31.0* 33.5*  MCV  --  92.3  PLT  --  [id number]*   Basic Metabolic Panel Recent Labs    05/02/19 0954 05/03/19 0245  NA 141 138  K 4.5 4.4  CL 106 107  CO2  --  24  GLUCOSE 103* 104*  BUN 18 17  CREATININE 0.90 1.01  CALCIUM  --  8.6*  MG  --  1.8   Liver Function Tests No results for input(s): AST, ALT, ALKPHOS, BILITOT, PROT, ALBUMIN in the last 72 hours. No results for input(s): LIPASE, AMYLASE in the last 72 hours. Cardiac Enzymes No results for input(s): CKTOTAL, CKMB, CKMBINDEX, TROPONINI in the last 72 hours. BNP Invalid input(s): POCBNP D-Dimer No results for input(s): DDIMER in the last 72 hours. Hemoglobin A1C No results for input(s): HGBA1C in the last 72 hours. Fasting Lipid Panel No results for input(s): CHOL, HDL, LDLCALC, TRIG, CHOLHDL, LDLDIRECT in the last 72 hours. Thyroid Function Tests No results for input(s): TSH, T4TOTAL, T3FREE, THYROIDAB in the last 72 hours.  Invalid input(s): FREET3 _____________  DG Chest 2 View  Result Date: 04/28/2019 CLINICAL DATA:  Preop TAVR 05/02/2019 EXAM: CHEST - 2 VIEW COMPARISON:  11/24/2015 FINDINGS: There is a small right pleural effusion. There is right basilar airspace disease likely reflecting atelectasis. There is no left pleural effusion. Lungs are otherwise clear. The heart and mediastinal contours are unremarkable. There is no acute osseous abnormality. IMPRESSION: Stable right pleuroparenchymal disease unchanged compared with 03/06/2019. Electronically Signed   By: Kathreen Devoid   On: 04/28/2019 10:09   ECHOCARDIOGRAM LIMITED  Result Date: 05/02/2019    ECHOCARDIOGRAM LIMITED REPORT   Patient Name:   Andrew Watson Date of Exam: 05/02/2019 Medical Rec #:  [id number]         Height:       70.0 in Accession #:    [id number]        Weight:       218.3 lb Date of Birth:  Aug 10, 1940         BSA:          2.166 m Patient Age:    1 years          BP:            111/60 mmHg Patient Gender: M                 HR:           73 bpm. Exam Location:  Inpatient Procedure: Limited Echo, Cardiac Doppler and  Color Doppler Indications:     Aortic stenosis  History:         Patient has prior history of Echocardiogram examinations, most                  recent 12/06/2018. CAD, TIA, Aortic Valve Disease,                  Arrythmias:Atrial Fibrillation; Signs/Symptoms:Pulmonary                  embolus.                  Aortic Valve: 26 mm Edwards Edwards Sapien prosthetic, stented                  (TAVR) valve is present in the aortic position. Procedure Date:                  05/02/2019.  Sonographer:     Dustin Flock Referring Phys:  [place] Diagnosing Phys: Sanda Klein MD                   PRE-PROCEDURAL FINDINGS:                   Normal left ventricular systolic function. Estimated LVEF                  60-65%.                  There are no regional wall motion abnormalities.                  Severe calcific aortic stenosis. Trileaflet aortic valve.                  Peak aortic valve gradient 66 mm Hg, mean gradient 34 mm Hg.                  Dimensionless obstructive index 0.2, calculated aortic valve                  area is 0.6 cm.                  No aortic insufficiency.                  No pericardial effusion.                   POST-PROCEDURAL FINDINGS:                   Hyperdynamic left ventricular systolic function. Estimated LVEF                  65-70%.                  There are no regional wall motion abnormalities.                  Well seated TAVR stent valve.                  Peak aortic valve gradient 10 mm Hg, mean gradient 6 mm Hg.                  Dimensionless obstructive index 0.5, calculated aortic valve                  area is 1.77 cm.  There is no aortic insufficiency and no perivalvular leak.                  No mitral insufficiency is seen.                  No pericardial effusion. IMPRESSIONS  1. There is a  26 mm Edwards Edwards Sapien prosthetic (TAVR) valve present in the aortic position. Procedure Date: 05/02/2019. FINDINGS  Aortic Valve: Aortic valve mean gradient measures 6.0 mmHg. Aortic valve peak gradient measures 10.5 mmHg. Aortic valve area, by VTI measures 1.72 cm. There is a 26 mm Edwards Edwards Sapien prosthetic, stented (TAVR) valve present in the aortic position. Procedure Date: 05/02/2019.  LEFT VENTRICLE PLAX 2D LVOT diam:     2.10 cm LV SV:         63 LV SV Index:   29 LVOT Area:     3.46 cm  AORTIC VALVE AV Area (Vmax):    1.99 cm AV Area (Vmean):   1.93 cm AV Area (VTI):     1.72 cm AV Vmax:           162.00 cm/s AV Vmean:          109.000 cm/s AV VTI:            0.368 m AV Peak Grad:      10.5 mmHg AV Mean Grad:      6.0 mmHg LVOT Vmax:         92.96 cm/s LVOT Vmean:        60.885 cm/s LVOT VTI:          0.183 m LVOT/AV VTI ratio: 0.50  SHUNTS Systemic VTI:  0.18 m Systemic Diam: 2.10 cm Sanda Klein MD Electronically signed by Sanda Klein MD Signature Date/Time: 05/02/2019/10:05:26 AM    Final    Structural Heart Procedure  Result Date: 05/02/2019 See surgical note for result.  HYBRID OR IMAGING ([facility] ONLY)  Result Date: 05/02/2019 There is no interpretation for this exam.  This order is for images obtained during a surgical procedure.  Please See "Surgeries" Tab for more information regarding the procedure.   Disposition   Pt is being discharged home today in good condition.  Follow-up Plans & Appointments    Follow-up Information     Eileen Stanford, PA-C. Go on 05/10/2019.   Specialties: Cardiology, Radiology Why: @ 3:30pm, please arrive at least 10 minutes early.  Contact information: [address] [place] [phone]              Discharge Medications   Allergies as of 05/03/2019   No Known Allergies      Medication List     STOP taking these medications    metoprolol tartrate 50 MG tablet Commonly known as:  LOPRESSOR       TAKE these medications    amoxicillin 500 MG capsule Commonly known as: AMOXIL Take 2,000 mg by mouth See admin instructions. Take 2000 mg by mouth 1 hour prior dental procedures.   aspirin 81 MG chewable tablet Chew 1 tablet (81 mg total) by mouth daily.   atorvastatin 10 MG tablet Commonly known as: LIPITOR Take 1 tablet (10 mg total) by mouth daily.   clopidogrel 75 MG tablet Commonly known as: PLAVIX Take 1 tablet (75 mg total) by mouth daily with breakfast. Start taking on: May 04, 2019   HYDROcodone-acetaminophen 5-325 MG tablet Commonly known as: NORCO/VICODIN Take 1 tablet by  mouth every 6 (six) hours as needed for moderate pain.   levothyroxine 112 MCG tablet Commonly known as: SYNTHROID Take 224 mcg by mouth daily before breakfast.   phenazopyridine 200 MG tablet Commonly known as: Pyridium Take 1 tablet (200 mg total) by mouth 3 (three) times daily as needed for pain (for burning with urination.).          Outstanding Labs/Studies   none  Duration of Discharge Encounter   Greater than 30 minutes including physician time.  Signed, Angelena Form, PA-C 05/03/2019, 10:20 AM [phone]  I have personally seen and examined this patient. I agree with the assessment and plan as outlined above.  He is stable. See my full note today.  Discharge home today.   Lauree Chandler 05/03/2019 10:29 AM

## 2019-05-04 ENCOUNTER — Telehealth: Payer: Self-pay | Admitting: Physician Assistant

## 2019-05-04 LAB — ECHOCARDIOGRAM COMPLETE
Height: 70 in
Weight: 3478.4 oz

## 2019-05-04 MED FILL — Heparin Sodium (Porcine) Inj 1000 Unit/ML: INTRAMUSCULAR | Qty: 30 | Status: AC

## 2019-05-04 MED FILL — Potassium Chloride Inj 2 mEq/ML: INTRAVENOUS | Qty: 40 | Status: AC

## 2019-05-04 MED FILL — Magnesium Sulfate Inj 50%: INTRAMUSCULAR | Qty: 10 | Status: AC

## 2019-05-04 NOTE — Telephone Encounter (Signed)
  [place] VALVE TEAM   Patient contacted regarding discharge from [facility] on 05/03/19  Patient understands to follow up with provider Nell Range on 4/14 at [place].  Patient understands discharge instructions? yes Patient understands medications and regimen? yes Patient understands to bring all medications to this visit? yes  Angelena Form PA-C  [facility]

## 2019-05-08 NOTE — Progress Notes (Signed)
[facility] [place]                                       Cardiology Office Note    Date:  05/10/2019   ID:  [facility], DOB 03/20/1940, MRN [id number]  PCP:  Antony Contras, MD  Cardiologist:  Dr. [name] / Dr. Angelena Form & Dr. Cyndia Bent (TAVR)  CC: [facility] s/p TAVR  History of Present Illness:  [facility] is a 79 y.o. male with a history of GERD, TIA (11/2018), remote atrial fibrillation (not on [place] [facility]), renal stone with hydronephrosis requiring ureteroscopic stone extraction/stenting, history of PE, hypothyroidism, and severe aortic stenosis s/p TAVR (05/02/19) who presents to clinic for follow up.   He had an echo in April 2013 which showed a mildly calcified aortic valve with a mean gradient of 15 mmHg.He had another echocardiogram in March 2019 at [facility] when he underwent preoperative cardiology evaluation before hip replacement. This reportedly showed moderate aortic stenosis and he underwent hip replacement without difficulty. He had a repeat echocardiogram on 12/06/2018 which showed progression to severe aortic stenosis with a mean gradient of 53 mmHg and peak gradient of 77 mmHg. Aortic valve area was 0.57 cm with a dimensionless index of 0.18. The aortic valve was severely thickened and calcified with restricted mobility of the leaflets. Diagnostic cath 02/28/19 showed mild non obst CAD and confirmed severe AS. Pre TAVR CT scans showed a 7x 24mm left UPJ stonewith obstruction. This was treated by Dr. Jeffie Pollock.   He was evaluated by the multidisciplinary valve team and underwent successful TAVR with a 26 mm Edwards Sapien 3 THV via the TF approach on 05/02/19. Post operative echo showed EF 65%, normally functioning TAVR with a mean gradient of 12.17mmg and no PVL. He was discharged on aspirin and plavix.   Today he presents to clinic for follow up. No CP or SOB. No LE edema, orthopnea or PND. No dizziness or syncope. No blood in stool or  urine. No palpitations. Feels much better since surgery. Girlfriend says his coloring is much better. Anxious to get back to the beach     Past Medical History:  Diagnosis Date  . Aortic valve stenosis   . Arthritis   . CAD (coronary artery disease) 05/07/2011   Mild atherosclerosis by cardiac catheterization February 2021  . GERD (gastroesophageal reflux disease)   . History of atrial fibrillation 2013   Reportedly limited episode  . History of kidney stones   . History of MRSA infection   . History of pancreatitis 2017   [facility]  . History of pulmonary embolism 2017  . History of TIA (transient ischemic attack)    November 2020  . Hypothyroidism   . S/P TAVR (transcatheter aortic valve replacement) 05/02/2019   s/p TAVR with a 26 mm Edwards Sapien 3 Ultra via the TF approach with Dr. Buena Irish & Dr. Cyndia Bent     Past Surgical History:  Procedure Laterality Date  . BACK SURGERY    . CARDIAC CATHETERIZATION    . CHOLECYSTECTOMY    . COLONOSCOPY  07/08/2011   Procedure: COLONOSCOPY;  Surgeon: Rogene Houston, MD;  Location: [facility];  Service: Endoscopy;  Laterality: N/A;  930  . CYSTOSCOPY WITH RETROGRADE PYELOGRAM, URETEROSCOPY AND STENT PLACEMENT Left 03/30/2019   Procedure: CYSTOSCOPY WITH LEFT  RETROGRADE , URETEROSCOPY HOLMIUM LASER  AND STENT PLACEMENT;  Surgeon: Irine Seal, MD;  Location: [facility];  Service: Urology;  Laterality: Left;  . DUODENOTOMY  09/29/2016   repair of fistula, jejunal resection  . INGUINAL HERNIA REPAIR N/A 03/01/2018   Procedure: LAPAROSCOPIC TEP BILATERAL INGUINAL HERNIA REPAIR;  Surgeon: Johnathan Hausen, MD;  Location: [facility];  Service: General;  Laterality: N/A;  . INTRAOPERATIVE TRANSTHORACIC ECHOCARDIOGRAM N/A 05/02/2019   Procedure: Intraoperative Transthoracic Echocardiogram;  Surgeon: Burnell Blanks, MD;  Location: [facility];  Service: Open Heart Surgery;  Laterality: N/A;  . JEJUNOSTOMY FEEDING TUBE    . KNEE ARTHROSCOPY Left   .  LEFT HEART CATHETERIZATION WITH CORONARY ANGIOGRAM N/A 05/07/2011   Procedure: LEFT HEART CATHETERIZATION WITH CORONARY ANGIOGRAM;  Surgeon: Peter M Martinique, MD;  [facility]: [facility];  Service: Cardiovascular;  Laterality: N/A;  . PEG TUBE PLACEMENT    . RIGHT/LEFT HEART CATH AND CORONARY ANGIOGRAPHY N/A 02/28/2019   Procedure: RIGHT/LEFT HEART CATH AND CORONARY ANGIOGRAPHY;  Surgeon: Burnell Blanks, MD;  Location: [facility];  Service: Cardiovascular;  Laterality: N/A;  . TRANSCATHETER AORTIC VALVE REPLACEMENT, TRANSFEMORAL  05/02/2019  . TRANSCATHETER AORTIC VALVE REPLACEMENT, TRANSFEMORAL N/A 05/02/2019   Procedure: TRANSCATHETER AORTIC VALVE REPLACEMENT, TRANSFEMORAL;  Surgeon: Burnell Blanks, MD;  Location: [facility];  Service: Open Heart Surgery;  Laterality: N/A;  . VASECTOMY      Current Medications: Outpatient Medications Prior to Visit  Medication Sig Dispense Refill  . amoxicillin (AMOXIL) 500 MG capsule Take 2,000 mg by mouth See admin instructions. Take 2000 mg by mouth 1 hour prior dental procedures.    [name] Kitchen aspirin 81 MG chewable tablet Chew 1 tablet (81 mg total) by mouth daily. 30 tablet 0  . atorvastatin (LIPITOR) 10 MG tablet Take 1 tablet (10 mg total) by mouth daily. 90 tablet 2  . clopidogrel (PLAVIX) 75 MG tablet Take 1 tablet (75 mg total) by mouth daily with breakfast. 90 tablet 1  . HYDROcodone-acetaminophen (NORCO/VICODIN) 5-325 MG tablet Take 1 tablet by mouth every 6 (six) hours as needed for moderate pain. 12 tablet 0  . levothyroxine (SYNTHROID, LEVOTHROID) 112 MCG tablet Take 224 mcg by mouth daily before breakfast.     . phenazopyridine (PYRIDIUM) 200 MG tablet Take 1 tablet (200 mg total) by mouth 3 (three) times daily as needed for pain (for burning with urination.). 10 tablet 1   No facility-administered medications prior to visit.     Allergies:   Patient has no known allergies.   Social History   Socioeconomic History  . Marital  status: Widowed    Spouse name: [name]  . Number of children: Not on file  . Years of education: Not on file  . Highest education level: High school graduate  Occupational History    Comment: retired   Tobacco Use  . Smoking status: Never Smoker  . Smokeless tobacco: Never Used  Substance and Sexual Activity  . Alcohol use: No    Comment: none since Jun 26, 2015  . Drug use: No  . Sexual activity: Not on file  Other Topics Concern  . Not on file  Social History Narrative   Lives with wife   Caffeine- coffee 2 c daily, usually decaf   Social Determinants of Health   Financial Resource Strain:   . Difficulty of Paying Living Expenses:   Food Insecurity:   . Worried About Charity fundraiser in the Last Year:   . [place] in the  Last Year:   Transportation Needs:   . Film/video editor (Medical):   [name] Kitchen Lack of Transportation (Non-Medical):   Physical Activity:   . Days of Exercise per Week:   . Minutes of Exercise per Session:   Stress:   . Feeling of Stress :   Social Connections:   . Frequency of Communication with Friends and Family:   . Frequency of Social Gatherings with Friends and Family:   . Attends Religious [facility]:   . Active Member of Clubs or Organizations:   . Attends Archivist Meetings:   [name] Kitchen Marital Status:      Family History:  The patient's family history includes Arthritis in his mother; Other in his brother.     ROS:   Please see the history of present illness.    ROS All other systems reviewed and are negative.   PHYSICAL EXAM:   VS:  BP 120/64   Pulse 77   Ht 5\' 10"  (1.778 m)   Wt 217 lb 1.9 oz (98.5 kg)   SpO2 95%   BMI 31.15 kg/m    GEN: Well nourished, well developed, in no acute distress HEENT: normal Neck: no JVD or masses Cardiac: RRR; soft flow murmurs. no rubs, or gallops,no edema  Respiratory:  clear to auscultation bilaterally, normal work of breathing GI: soft, nontender, nondistended, + BS MS: no  deformity or atrophy Skin: warm and dry, no rash.  Groin sites clear without hematoma or ecchymosis  Neuro:  Alert and Oriented x 3, Strength and sensation are intact Psych: euthymic mood, full affect   Wt Readings from Last 3 Encounters:  05/10/19 217 lb 1.9 oz (98.5 kg)  05/03/19 217 lb 6.4 oz (98.6 kg)  04/28/19 218 lb (98.9 kg)      Studies/Labs Reviewed:   EKG:  EKG is ordered today.  The ekg ordered today demonstrates sinus with 1st deg AV block (old)  Recent Labs: 04/28/2019: ALT 22; B Natriuretic Peptide 213.3 05/03/2019: BUN 17; Creatinine, Ser 1.01; Hemoglobin 10.7; Magnesium 1.8; Platelets 110; Potassium 4.4; Sodium 138   Lipid Panel    Component Value Date/Time   CHOL 116 12/06/2018 1238   TRIG 25 12/06/2018 1238   HDL 45 12/06/2018 1238   CHOLHDL 2.6 12/06/2018 1238   VLDL 5 12/06/2018 1238   LDLCALC 66 12/06/2018 1238    Additional studies/ records that were reviewed today include:  TAVR OPERATIVE NOTE   Date of Procedure:05/02/2019  Preoperative Diagnosis:Severe Aortic Stenosis   Postoperative Diagnosis:Same   Procedure:   Transcatheter Aortic Valve Replacement - PercutaneousRightTransfemoral Approach Edwards Sapien 3 Ultra THV (size 85mm, model # [id number], serial # [id number])  Co-Surgeons:Bryan Alveria Apley, MD and Lauree Chandler, MD  Anesthesiologist:K. Smith Robert, MD  Echocardiographer:M. Croitoru, MD  Pre-operative Echo Findings: ? Severe aortic stenosis ? Normalleft ventricular systolic function  Post-operative Echo Findings: ? Noparavalvular leak ? Normalleft ventricular systolic function  _____________    Echo 05/03/19 IMPRESSIONS  1. Left ventricular ejection fraction, by estimation, is 65 to 70%. The  left ventricle has normal function. The left ventricle has no regional  wall motion abnormalities. Left ventricular  diastolic parameters are  consistent with Grade II diastolic  dysfunction (pseudonormalization). Elevated left atrial pressure.  2. Right ventricular systolic function is normal. The right ventricular  size is normal.  3. Left atrial size was severely dilated.  4. The mitral valve is normal in structure. No evidence of mitral valve  regurgitation. No evidence of mitral stenosis.  5.  The aortic valve has been repaired/replaced. Aortic valve  regurgitation is not visualized. No aortic stenosis is present. There is a  26 mm Edwards Sapien prosthetic (TAVR) valve present in the aortic  position. Procedure Date: 05/02/2019. Echo findings  are consistent with normal structure and function of the aortic valve  prosthesis. Aortic valve mean gradient measures 12.8 mmHg. Aortic valve  Vmax measures 2.52 m/s.  6. The inferior vena cava is normal in size with greater than 50%  respiratory variability, suggesting right atrial pressure of 3 mmHg.     ASSESSMENT & PLAN:   Severe AS s/p TAVR:doing well. Groin sites healing well. ECG with no HAVB. SBE prophylaxis discussed; he has amoxicillin. Continue aspirin and plavix. Plavix can be discontinued after 6 month of therapy ( 10/2019). He will do his 1 month appointment with Dr. [name] in [place]. Echo scheduled for the Friday before his appt.   Chronic diastolic CHF: appears euvolemic off diuretic therapy.   Abnormal chest CT: pre TAVR CT reported findings at the right lung base most compatible with rounded atelectasis. Follow-up chest CT advised in 3 months to document stability of this finding. I will get this set up  Thyroid nodule: pre TAVR CT showed a 1.5 cm thyroid nodule. Thyroid US recommended. I will get this set up.   Medication Adjustments/Labs and Tests Ordered: Current medicines are reviewed at length with the patient today.  Concerns regarding medicines are outlined above.  Medication changes, Labs and Tests ordered today  are listed in the Patient Instructions below. Patient Instructions  Medication Instructions:  Your provider recommends that you continue on your current medications as directed. Please refer to the Current Medication list given to you today.   *If you need a refill on your cardiac medications before your next appointment, please call your pharmacy*  Testing/Procedures: Joellen Jersey recommends you have a THYROID ULTRASOUND at [facility].  Katie recommends you have a CHEST CT at [facility] in 3 months.  Follow-Up:     Mable Fill, Hershal Coria  05/10/2019 3:48 PM    [facility] [address], [place], [place]  [address] Phone: [phone]; Fax: [phone]

## 2019-05-10 ENCOUNTER — Ambulatory Visit (HOSPITAL_COMMUNITY)
Admission: RE | Admit: 2019-05-10 | Discharge: 2019-05-10 | Disposition: A | Discharge status: Home / Self Care | Payer: Medicare Other | Source: Ambulatory Visit | Attending: Urology | Admitting: Urology

## 2019-05-10 ENCOUNTER — Other Ambulatory Visit: Payer: Self-pay | Admitting: Physician Assistant

## 2019-05-10 ENCOUNTER — Other Ambulatory Visit: Payer: Self-pay

## 2019-05-10 ENCOUNTER — Encounter: Payer: Self-pay | Admitting: Physician Assistant

## 2019-05-10 ENCOUNTER — Ambulatory Visit: Payer: Medicare Other | Admitting: Physician Assistant

## 2019-05-10 VITALS — BP 120/64 | HR 77 | Ht 70.0 in | Wt 217.1 lb

## 2019-05-10 DIAGNOSIS — Z952 Presence of prosthetic heart valve: Secondary | ICD-10-CM

## 2019-05-10 DIAGNOSIS — N200 Calculus of kidney: Secondary | ICD-10-CM | POA: Insufficient documentation

## 2019-05-10 DIAGNOSIS — R9389 Abnormal findings on diagnostic imaging of other specified body structures: Secondary | ICD-10-CM

## 2019-05-10 DIAGNOSIS — I5032 Chronic diastolic (congestive) heart failure: Secondary | ICD-10-CM | POA: Diagnosis not present

## 2019-05-10 DIAGNOSIS — E041 Nontoxic single thyroid nodule: Secondary | ICD-10-CM

## 2019-05-10 DIAGNOSIS — Z87442 Personal history of urinary calculi: Secondary | ICD-10-CM | POA: Diagnosis not present

## 2019-05-10 NOTE — Patient Instructions (Addendum)
Medication Instructions:  Your provider recommends that you continue on your current medications as directed. Please refer to the Current Medication list given to you today.   *If you need a refill on your cardiac medications before your next appointment, please call your pharmacy*  Testing/Procedures: Joellen Jersey recommends you have a THYROID ULTRASOUND at [facility].  Katie recommends you have a CHEST CT at [facility] in 3 months.  Follow-Up: Please keep your follow-up appointments!

## 2019-05-11 ENCOUNTER — Telehealth: Payer: Self-pay

## 2019-05-11 NOTE — Telephone Encounter (Signed)
Pt.notified

## 2019-05-11 NOTE — Telephone Encounter (Signed)
-----   Message from Irine Seal, MD sent at 05/11/2019 10:44 AM EDT ----- The [place] looks good.  No hydro.

## 2019-05-11 NOTE — Progress Notes (Signed)
Left message to return call 

## 2019-05-11 NOTE — Telephone Encounter (Signed)
Left message to return call 

## 2019-05-16 ENCOUNTER — Other Ambulatory Visit: Payer: Self-pay

## 2019-05-16 ENCOUNTER — Ambulatory Visit (HOSPITAL_COMMUNITY)
Admission: RE | Admit: 2019-05-16 | Discharge: 2019-05-16 | Disposition: A | Discharge status: Home / Self Care | Payer: Medicare Other | Source: Ambulatory Visit | Attending: Physician Assistant | Admitting: Physician Assistant

## 2019-05-16 DIAGNOSIS — E041 Nontoxic single thyroid nodule: Secondary | ICD-10-CM | POA: Diagnosis not present

## 2019-05-19 ENCOUNTER — Ambulatory Visit: Payer: Medicare Other | Admitting: Urology

## 2019-05-19 DIAGNOSIS — Z952 Presence of prosthetic heart valve: Secondary | ICD-10-CM | POA: Diagnosis not present

## 2019-05-19 DIAGNOSIS — E041 Nontoxic single thyroid nodule: Secondary | ICD-10-CM | POA: Diagnosis not present

## 2019-05-19 DIAGNOSIS — E7800 Pure hypercholesterolemia, unspecified: Secondary | ICD-10-CM | POA: Diagnosis not present

## 2019-05-22 ENCOUNTER — Other Ambulatory Visit: Payer: Self-pay | Admitting: Family Medicine

## 2019-05-22 DIAGNOSIS — E041 Nontoxic single thyroid nodule: Secondary | ICD-10-CM

## 2019-05-31 ENCOUNTER — Other Ambulatory Visit (HOSPITAL_COMMUNITY)
Admission: RE | Admit: 2019-05-31 | Discharge: 2019-05-31 | Disposition: A | Discharge status: Home / Self Care | Payer: Medicare Other | Source: Ambulatory Visit | Attending: Family Medicine | Admitting: Family Medicine

## 2019-05-31 ENCOUNTER — Ambulatory Visit
Admission: RE | Admit: 2019-05-31 | Discharge: 2019-05-31 | Disposition: A | Discharge status: Home / Self Care | Payer: Medicare Other | Source: Ambulatory Visit | Attending: Family Medicine | Admitting: Family Medicine

## 2019-05-31 DIAGNOSIS — E041 Nontoxic single thyroid nodule: Secondary | ICD-10-CM | POA: Insufficient documentation

## 2019-06-01 LAB — CYTOLOGY - NON PAP

## 2019-06-09 ENCOUNTER — Other Ambulatory Visit: Payer: Self-pay

## 2019-06-09 ENCOUNTER — Ambulatory Visit (HOSPITAL_COMMUNITY)
Admission: RE | Admit: 2019-06-09 | Discharge: 2019-06-09 | Disposition: A | Discharge status: Home / Self Care | Payer: Medicare Other | Source: Ambulatory Visit | Attending: Physician Assistant | Admitting: Physician Assistant

## 2019-06-09 DIAGNOSIS — Z952 Presence of prosthetic heart valve: Secondary | ICD-10-CM

## 2019-06-09 NOTE — Progress Notes (Signed)
*  PRELIMINARY RESULTS* Echocardiogram 2D Echocardiogram has been performed.  Leavy Cella 06/09/2019, 2:00 PM

## 2019-06-12 ENCOUNTER — Ambulatory Visit: Payer: Medicare Other | Admitting: Cardiology

## 2019-06-14 ENCOUNTER — Ambulatory Visit: Payer: Medicare Other | Admitting: Cardiology

## 2019-07-03 ENCOUNTER — Ambulatory Visit: Payer: Medicare Other | Admitting: Cardiology

## 2019-07-12 ENCOUNTER — Other Ambulatory Visit: Payer: Self-pay

## 2019-07-12 ENCOUNTER — Encounter: Payer: Self-pay | Admitting: Student

## 2019-07-12 ENCOUNTER — Ambulatory Visit (INDEPENDENT_AMBULATORY_CARE_PROVIDER_SITE_OTHER): Payer: Medicare Other | Admitting: Student

## 2019-07-12 VITALS — BP 118/80 | HR 80 | Ht 71.0 in | Wt 219.0 lb

## 2019-07-12 DIAGNOSIS — I2510 Atherosclerotic heart disease of native coronary artery without angina pectoris: Secondary | ICD-10-CM | POA: Diagnosis not present

## 2019-07-12 DIAGNOSIS — E785 Hyperlipidemia, unspecified: Secondary | ICD-10-CM

## 2019-07-12 DIAGNOSIS — Z952 Presence of prosthetic heart valve: Secondary | ICD-10-CM | POA: Diagnosis not present

## 2019-07-12 DIAGNOSIS — R9389 Abnormal findings on diagnostic imaging of other specified body structures: Secondary | ICD-10-CM

## 2019-07-12 NOTE — Progress Notes (Signed)
Cardiology Office Note    Date:  07/12/2019   ID:  Andrew Watson, DOB 09-Nov-1940, MRN [id number]  PCP:  Antony Contras, MD  Cardiologist: Rozann Lesches, MD   TAVR: Dr. Angelena Form  Chief Complaint  Patient presents with  . Follow-up    2 month visit    History of Present Illness:    Andrew Watson is a 79 y.o. male with past medical history of CAD (nonobstructive disease by cath in 02/2019), severe AS (s/p TAVR on 05/02/2019), HTN, HLD, history of PE, prior TIA and remote history of atrial fibrillation (no longer on anticoagulation) who presents to the office today for 48-month follow-up.   He was last examined by Nell Range, PA-C on 05/10/2019 for follow-up from his recent TAVR and denied any recent chest pain or dyspnea on exertion. Weight was stable at 217 lbs and he was continued on his current medication regimen. The importance of SBE prophylaxis was addressed. Was informed to continue on Plavix for 6 months. Was also scheduled for a follow-up echocardiogram in 1 month and a follow-up CT was scheduled for 3 months given his pre-TAVR CT had shown a finding at the right lung base most compatible with rounded atelectasis and repeat Chest CT was advised.  Follow-up echocardiogram on 06/09/2019 did show a preserved EF of 65-70% with Grade 2 DD and his TAVR was functioning normally with a mean gradient of 15 mmHg and no peri-valvular leak noted.   In talking with the patient today, he reports feeling like a "new man" since TAVR. He has increased stamina and has noticed a significant improvement in his breathing. He has been active in traveling but also stays busy at home and has pushed mowed his yard multiple times without symptoms. He also has a place [facility] and goes there frequently to fish.  He denies any chest pain, dyspnea on exertion, orthopnea, PND or lower extremity edema. He reports having a history of cramps along his upper and lower extremities for many years and he  feels like this slightly worsened with initiation of Atorvastatin. He did talk with his pharmacist and it was recommended he try CoQ10 and he has noticed improvement since starting this.   Past Medical History:  Diagnosis Date  . Aortic valve stenosis   . Arthritis   . CAD (coronary artery disease) 05/07/2011   Mild atherosclerosis by cardiac catheterization February 2021  . GERD (gastroesophageal reflux disease)   . History of atrial fibrillation 2013   Reportedly limited episode  . History of kidney stones   . History of MRSA infection   . History of pancreatitis 2017   [facility]  . History of pulmonary embolism 2017  . History of TIA (transient ischemic attack)    November 2020  . Hypothyroidism   . S/P TAVR (transcatheter aortic valve replacement) 05/02/2019   s/p TAVR with a 26 mm Edwards Sapien 3 Ultra via the TF approach with Dr. Buena Irish & Dr. Cyndia Bent     Past Surgical History:  Procedure Laterality Date  . BACK SURGERY    . CARDIAC CATHETERIZATION    . CHOLECYSTECTOMY    . COLONOSCOPY  07/08/2011   Procedure: COLONOSCOPY;  Surgeon: Rogene Houston, MD;  Location: [facility];  Service: Endoscopy;  Laterality: N/A;  930  . CYSTOSCOPY WITH RETROGRADE PYELOGRAM, URETEROSCOPY AND STENT PLACEMENT Left 03/30/2019   Procedure: CYSTOSCOPY WITH LEFT  RETROGRADE , URETEROSCOPY HOLMIUM LASER AND STENT PLACEMENT;  Surgeon: Irine Seal,  MD;  Location: [facility];  Service: Urology;  Laterality: Left;  . DUODENOTOMY  09/29/2016   repair of fistula, jejunal resection  . INGUINAL HERNIA REPAIR N/A 03/01/2018   Procedure: LAPAROSCOPIC TEP BILATERAL INGUINAL HERNIA REPAIR;  Surgeon: Johnathan Hausen, MD;  Location: [facility];  Service: General;  Laterality: N/A;  . INTRAOPERATIVE TRANSTHORACIC ECHOCARDIOGRAM N/A 05/02/2019   Procedure: Intraoperative Transthoracic Echocardiogram;  Surgeon: Burnell Blanks, MD;  Location: [place];  Service: Open Heart Surgery;  Laterality: N/A;  .  JEJUNOSTOMY FEEDING TUBE    . KNEE ARTHROSCOPY Left   . LEFT HEART CATHETERIZATION WITH CORONARY ANGIOGRAM N/A 05/07/2011   Procedure: LEFT HEART CATHETERIZATION WITH CORONARY ANGIOGRAM;  Surgeon: Peter M Martinique, MD;  [facility];  Service: Cardiovascular;  Laterality: N/A;  . PEG TUBE PLACEMENT    . RIGHT/LEFT HEART CATH AND CORONARY ANGIOGRAPHY N/A 02/28/2019   Procedure: RIGHT/LEFT HEART CATH AND CORONARY ANGIOGRAPHY;  Surgeon: Burnell Blanks, MD;  Location: [place] CV LAB;  Service: Cardiovascular;  Laterality: N/A;  . TRANSCATHETER AORTIC VALVE REPLACEMENT, TRANSFEMORAL  05/02/2019  . TRANSCATHETER AORTIC VALVE REPLACEMENT, TRANSFEMORAL N/A 05/02/2019   Procedure: TRANSCATHETER AORTIC VALVE REPLACEMENT, TRANSFEMORAL;  Surgeon: Burnell Blanks, MD;  Location: [place];  Service: Open Heart Surgery;  Laterality: N/A;  . VASECTOMY      Current Medications: Outpatient Medications Prior to Visit  Medication Sig Dispense Refill  . amoxicillin (AMOXIL) 500 MG capsule Take 2,000 mg by mouth See admin instructions. Take 2000 mg by mouth 1 hour prior dental procedures.    [name] Kitchen aspirin 81 MG chewable tablet Chew 1 tablet (81 mg total) by mouth daily. 30 tablet 0  . atorvastatin (LIPITOR) 10 MG tablet Take 1 tablet (10 mg total) by mouth daily. 90 tablet 2  . clopidogrel (PLAVIX) 75 MG tablet Take 1 tablet (75 mg total) by mouth daily with breakfast. 90 tablet 1  . HYDROcodone-acetaminophen (NORCO/VICODIN) 5-325 MG tablet Take 1 tablet by mouth every 6 (six) hours as needed for moderate pain. 12 tablet 0  . levothyroxine (SYNTHROID, LEVOTHROID) 112 MCG tablet Take 224 mcg by mouth daily before breakfast.     . phenazopyridine (PYRIDIUM) 200 MG tablet Take 1 tablet (200 mg total) by mouth 3 (three) times daily as needed for pain (for burning with urination.). 10 tablet 1   No facility-administered medications prior to visit.     Allergies:   Patient has no known allergies.    Social History   Socioeconomic History  . Marital status: Widowed    Spouse name: [name]  . Number of children: Not on file  . Years of education: Not on file  . Highest education level: High school graduate  Occupational History    Comment: retired   Tobacco Use  . Smoking status: Never Smoker  . Smokeless tobacco: Never Used  Vaping Use  . Vaping Use: Never used  Substance and Sexual Activity  . Alcohol use: No    Comment: none since Jun 26, 2015  . Drug use: No  . Sexual activity: Not on file  Other Topics Concern  . Not on file  Social History Narrative   Lives with wife   Caffeine- coffee 2 c daily, usually decaf   Social Determinants of Health   Financial Resource Strain:   . Difficulty of Paying Living Expenses:   Food Insecurity:   . Worried About Charity fundraiser in the Last Year:   . [employer]  in the Last Year:   Transportation Needs:   . Film/video editor (Medical):   [name] Kitchen Lack of Transportation (Non-Medical):   Physical Activity:   . Days of Exercise per Week:   . Minutes of Exercise per Session:   Stress:   . Feeling of Stress :   Social Connections:   . Frequency of Communication with Friends and Family:   . Frequency of Social Gatherings with Friends and Family:   . Attends Religious Services:   . Active Member of Clubs or Organizations:   . Attends Archivist Meetings:   [name] Kitchen Marital Status:      Family History:  The patient's family history includes Arthritis in his mother; Other in his brother.   Review of Systems:   Please see the history of present illness.     General:  No chills, fever, night sweats or weight changes. Positive for muscle cramps.  Cardiovascular:  No chest pain, dyspnea on exertion, edema, orthopnea, palpitations, paroxysmal nocturnal dyspnea. Dermatological: No rash, lesions/masses Respiratory: No cough, dyspnea Urologic: No hematuria, dysuria Abdominal:   No nausea, vomiting, diarrhea, bright red  blood per rectum, melena, or hematemesis Neurologic:  No visual changes, wkns, changes in mental status. All other systems reviewed and are otherwise negative except as noted above.   Physical Exam:    VS:  BP 118/80   Pulse 80   Ht 5\' 11"  (1.803 m)   Wt 219 lb (99.3 kg)   SpO2 95%   BMI 30.54 kg/m    General: Well developed, well nourished,male appearing in no acute distress. Head: Normocephalic, atraumatic, sclera non-icteric.  Neck: No carotid bruits. JVD not elevated.  Lungs: Respirations regular and unlabored, without wheezes or rales.  Heart: Regular rate and rhythm. No S3 or S4. Soft SEM along RUSB.  Abdomen: Soft, non-tender, non-distended. No obvious abdominal masses. Msk:  Strength and tone appear normal for age. No obvious joint deformities or effusions. Extremities: No clubbing or cyanosis. No lower extremity edema.  Distal pedal pulses are 2+ bilaterally. Neuro: Alert and oriented X 3. Moves all extremities spontaneously. No focal deficits noted. Psych:  Responds to questions appropriately with a normal affect. Skin: No rashes or lesions noted  Wt Readings from Last 3 Encounters:  07/12/19 219 lb (99.3 kg)  05/10/19 217 lb 1.9 oz (98.5 kg)  05/03/19 217 lb 6.4 oz (98.6 kg)     Studies/Labs Reviewed:   EKG:  EKG is not ordered today.   Recent Labs: 04/28/2019: ALT 22; B Natriuretic Peptide 213.3 05/03/2019: BUN 17; Creatinine, Ser 1.01; Hemoglobin 10.7; Magnesium 1.8; Platelets 110; Potassium 4.4; Sodium 138   Lipid Panel    Component Value Date/Time   CHOL 116 12/06/2018 1238   TRIG 25 12/06/2018 1238   HDL 45 12/06/2018 1238   CHOLHDL 2.6 12/06/2018 1238   VLDL 5 12/06/2018 1238   LDLCALC 66 12/06/2018 1238    Additional studies/ records that were reviewed today include:   Cardiac Catheterization: 02/2019  Mid LAD lesion is 20% stenosed.  1st Sept lesion is 80% stenosed.  2nd Mrg lesion is 20% stenosed.  Hemodynamic findings consistent with  aortic valve stenosis.   1. Mild non-obstructive CAD 2. Severe aortic valve stenosis (mean gradient 60.2 mmHg, peak to peak gradient 75 mmHg, AVA 0.55 cm2)  Recommendations: Will continue medical management of mild CAD. Continue with workup for TAVR.   Zio Monitor: 02/2019 Zio patch reviewed.  13 days 12 hours analyzed.  Predominant rhythm is  sinus with heart rate ranging from 59 bpm up to 130 bpm and average heart rate 79 bpm.  Brief episodes of SVT were noted lasting 7-8 beats.  Otherwise rare PACs and PVCs representing less than 1% of total beats.  No definite atrial fibrillation.  Echocardiogram: 05/2019 IMPRESSIONS    1. Left ventricular ejection fraction, by estimation, is 65 to 70%. The  left ventricle has normal function. The left ventricle has no regional  wall motion abnormalities. There is mild concentric left ventricular  hypertrophy. Left ventricular diastolic  parameters are consistent with Grade II diastolic dysfunction  (pseudonormalization).  2. Right ventricular systolic function is normal. The right ventricular  size is normal. Tricuspid regurgitation signal is inadequate for assessing  PA pressure.  3. The mitral valve is grossly normal. No evidence of mitral valve  regurgitation.  4. The aortic valve has been repaired/replaced. Aortic valve  regurgitation is not visualized. No aortic stenosis is present. There is a  26 mm Edwards Sapien prosthetic (TAVR) valve present in the aortic  position. Procedure Date: 05/02/2019. Echo findings  are consistent with normal structure and function of the aortic valve  prosthesis. Aortic valve mean gradient measures 15.0 mmHg. Aortic valve  Vmax measures 2.63 m/s.    Assessment:    1. Coronary artery disease involving native coronary artery of native heart without angina pectoris   2. S/P TAVR (transcatheter aortic valve replacement)   3. Hyperlipidemia LDL goal <70   4. Abnormal chest CT      Plan:   In order of  problems listed above:  1. CAD - Prior catheterization in 02/2019 showed mild nonobstructive CAD as outlined above. He denies any recent chest pain or dyspnea on exertion. Will continue with risk factor modification. He remains on ASA 81 mg daily, Plavix 75mg  daily (due to TAVR) and Lipitor 10 mg daily.  2. Severe Aortic Stenosis - He is s/p TAVR on 05/02/2019 and recent echocardiogram on 06/09/2019 showed a preserved EF of 65-70% with Grade 2 DD and his TAVR was functioning normally with a mean gradient of 15 mmHg and no peri-valvular leak noted. He now has NYHA Class I symptoms. Continue with ASA and Plavix until 10/2019 and then plan to stop Plavix as previously recommended by the Structural Heart team.   3. HLD - Followed by PCP. FLP in 11/2018 showed LDL at 66. Continue Atorvastatin 10mg  daily. I am not sure that his cramping is related to statin therapy as he reports having a history of cramps prior to initiation of statin therapy. He will remain on CoQ10. If symptoms progress, could try switching to low-dose Crestor.   4. Abnormal Chest CT - Pre-TAVR CT had shown a finding at the right lung base most compatible with rounded atelectasis and repeat Chest CT was advised. Scheduled for 08/18/2019.   Medication Adjustments/Labs and Tests Ordered: Current medicines are reviewed at length with the patient today.  Concerns regarding medicines are outlined above.  Medication changes, Labs and Tests ordered today are listed in the Patient Instructions below. Patient Instructions  Medication Instructions:   None  Labwork:  None  Testing/Procedures:  None  Follow-Up:  6 month follow-up with Dr. [name] or Bernerd Pho, PA-C  Any Other Special Instructions Will Be Listed Below (If Applicable).   If you need a refill on your cardiac medications before your next appointment, please call your pharmacy.    Signed, Erma Heritage, PA-C  07/12/2019 5:23 PM    [facility] [address] [place], [place] [address] Phone: [phone] Fax: [phone]

## 2019-07-12 NOTE — Patient Instructions (Signed)
Medication Instructions:   None  Labwork:  None  Testing/Procedures:  None  Follow-Up:  6 month follow-up with Dr. [name] or Bernerd Pho, PA-C  Any Other Special Instructions Will Be Listed Below (If Applicable).     If you need a refill on your cardiac medications before your next appointment, please call your pharmacy.

## 2019-07-14 ENCOUNTER — Encounter: Payer: Self-pay | Admitting: Urology

## 2019-07-14 ENCOUNTER — Other Ambulatory Visit: Payer: Self-pay

## 2019-07-14 ENCOUNTER — Ambulatory Visit (INDEPENDENT_AMBULATORY_CARE_PROVIDER_SITE_OTHER): Payer: Medicare Other | Admitting: Urology

## 2019-07-14 ENCOUNTER — Ambulatory Visit: Payer: Medicare Other | Admitting: Urology

## 2019-07-14 VITALS — BP 115/71 | HR 80 | Temp 97.9°F | Ht 70.0 in | Wt 220.0 lb

## 2019-07-14 DIAGNOSIS — N200 Calculus of kidney: Secondary | ICD-10-CM

## 2019-07-14 LAB — POCT URINALYSIS DIPSTICK
Blood, UA: NEGATIVE
Glucose, UA: NEGATIVE
Ketones, UA: NEGATIVE
Leukocytes, UA: NEGATIVE
Nitrite, UA: NEGATIVE
Protein, UA: NEGATIVE
Spec Grav, UA: >=1.03 — AB (ref 1.010–1.025)
Urobilinogen, UA: NEGATIVE E.U./dL — AB
pH, UA: 5 (ref 5.0–8.0)

## 2019-07-14 NOTE — Progress Notes (Signed)
Urological Symptom Review  Patient is experiencing the following symptoms: Frequent urination Weak stream   Review of Systems  Gastrointestinal (upper)  : Negative for upper GI symptoms  Gastrointestinal (lower) : Negative for lower GI symptoms  Constitutional : Negative for symptoms  Skin: Negative for skin symptoms  Eyes: Negative for eye symptoms  Ear/Nose/Throat : Negative for Ear/Nose/Throat symptoms  Hematologic/Lymphatic: Negative for Hematologic/Lymphatic symptoms  Cardiovascular : Negative for cardiovascular symptoms  Respiratory : Negative for respiratory symptoms  Endocrine: Negative for endocrine symptoms  Musculoskeletal: Negative for musculoskeletal symptoms  Neurological: Negative for neurological symptoms  Psychologic: Negative for psychiatric symptoms

## 2019-07-14 NOTE — Progress Notes (Signed)
Subjective: 1. Ureteral stone   2. Kidney stones   3. Calculus of kidney    Macky returns today in f/u from left ureteroscopic stone extraction with laser and stent for stent removal in March.  His stent was removed and he is doing well without complaints.  A renal US in 4/12 was normal.  GU Hx: Hussain is sent back in consultation by his cardiologist for a 7 x 13 mm left UPJ stone with obstruction  found on a CT earlier this month during an admission for aortic stenosis.  He was last seen by me on 02/04/18 for a history of hematuria and had a 20mm LLP stone on imaging at that time.  He has no pain or hematuria.   KUB today confirms the stone in the area of the left UPJ.    He has been seen by Dr. Cyndia Bent and will be scheduled for a transcatheter aortic valve replacement after definitive stone management.   He has been cleared by cardiology to proceed with stone management.  ROS:  ROS  No Known Allergies  Past Medical History:  Diagnosis Date  . Aortic valve stenosis   . Arthritis   . CAD (coronary artery disease) 05/07/2011   Mild atherosclerosis by cardiac catheterization February 2021  . GERD (gastroesophageal reflux disease)   . History of atrial fibrillation 2013   Reportedly limited episode  . History of kidney stones   . History of MRSA infection   . History of pancreatitis 2017   [facility]  . History of pulmonary embolism 2017  . History of TIA (transient ischemic attack)    November 2020  . Hypothyroidism   . S/P TAVR (transcatheter aortic valve replacement) 05/02/2019   s/p TAVR with a 26 mm Edwards Sapien 3 Ultra via the TF approach with Dr. Buena Irish & Dr. Cyndia Bent     Past Surgical History:  Procedure Laterality Date  . BACK SURGERY    . CARDIAC CATHETERIZATION    . CHOLECYSTECTOMY    . COLONOSCOPY  07/08/2011   Procedure: COLONOSCOPY;  Surgeon: Rogene Houston, MD;  [facility];  Service: Endoscopy;  Laterality: N/A;  930  . CYSTOSCOPY WITH  RETROGRADE PYELOGRAM, URETEROSCOPY AND STENT PLACEMENT Left 03/30/2019   Procedure: CYSTOSCOPY WITH LEFT  RETROGRADE , URETEROSCOPY HOLMIUM LASER AND STENT PLACEMENT;  Surgeon: Irine Seal, MD;  Location: [facility];  Service: Urology;  Laterality: Left;  . DUODENOTOMY  09/29/2016   repair of fistula, jejunal resection  . INGUINAL HERNIA REPAIR N/A 03/01/2018   Procedure: LAPAROSCOPIC TEP BILATERAL INGUINAL HERNIA REPAIR;  Surgeon: Johnathan Hausen, MD;  Location: [facility];  Service: General;  Laterality: N/A;  . INTRAOPERATIVE TRANSTHORACIC ECHOCARDIOGRAM N/A 05/02/2019   Procedure: Intraoperative Transthoracic Echocardiogram;  Surgeon: Burnell Blanks, MD;  Location: [place];  Service: Open Heart Surgery;  Laterality: N/A;  . JEJUNOSTOMY FEEDING TUBE    . KNEE ARTHROSCOPY Left   . LEFT HEART CATHETERIZATION WITH CORONARY ANGIOGRAM N/A 05/07/2011   Procedure: LEFT HEART CATHETERIZATION WITH CORONARY ANGIOGRAM;  Surgeon: Peter M Martinique, MD;  Location: [facility] CATH LAB;  Service: Cardiovascular;  Laterality: N/A;  . PEG TUBE PLACEMENT    . RIGHT/LEFT HEART CATH AND CORONARY ANGIOGRAPHY N/A 02/28/2019   Procedure: RIGHT/LEFT HEART CATH AND CORONARY ANGIOGRAPHY;  Surgeon: Burnell Blanks, MD;  Location: [place] CV LAB;  Service: Cardiovascular;  Laterality: N/A;  . TRANSCATHETER AORTIC VALVE REPLACEMENT, TRANSFEMORAL  05/02/2019  . TRANSCATHETER AORTIC VALVE REPLACEMENT, TRANSFEMORAL N/A  05/02/2019   Procedure: TRANSCATHETER AORTIC VALVE REPLACEMENT, TRANSFEMORAL;  Surgeon: Burnell Blanks, MD;  Location: [place];  Service: Open Heart Surgery;  Laterality: N/A;  . VASECTOMY      Social History   Socioeconomic History  . Marital status: Widowed    Spouse name: [name]  . Number of children: Not on file  . Years of education: Not on file  . Highest education level: High school graduate  Occupational History    Comment: retired   Tobacco Use  . Smoking status: Never Smoker  . Smokeless  tobacco: Never Used  Vaping Use  . Vaping Use: Never used  Substance and Sexual Activity  . Alcohol use: No    Comment: none since Jun 26, 2015  . Drug use: No  . Sexual activity: Not on file  Other Topics Concern  . Not on file  Social History Narrative   Lives with wife   Caffeine- coffee 2 c daily, usually decaf   Social Determinants of Health   Financial Resource Strain:   . Difficulty of Paying Living Expenses:   Food Insecurity:   . Worried About Charity fundraiser in the Last Year:   . Arboriculturist in the Last Year:   Transportation Needs:   . Film/video editor (Medical):   [name] Kitchen Lack of Transportation (Non-Medical):   Physical Activity:   . Days of Exercise per Week:   . Minutes of Exercise per Session:   Stress:   . Feeling of Stress :   Social Connections:   . Frequency of Communication with Friends and Family:   . Frequency of Social Gatherings with Friends and Family:   . Attends Religious Services:   . Active Member of Clubs or Organizations:   . Attends Archivist Meetings:   [name] Kitchen Marital Status:   Intimate Partner Violence:   . Fear of Current or Ex-Partner:   . Emotionally Abused:   [name] Kitchen Physically Abused:   . Sexually Abused:     Family History  Problem Relation Age of Onset  . Arthritis Mother   . Other Brother        staph infection    Anti-infectives: Anti-infectives (From admission, onward)   None      Current Outpatient Medications  Medication Sig Dispense Refill  . amoxicillin (AMOXIL) 500 MG capsule Take 2,000 mg by mouth See admin instructions. Take 2000 mg by mouth 1 hour prior dental procedures.    [name] Kitchen aspirin 81 MG chewable tablet Chew 1 tablet (81 mg total) by mouth daily. 30 tablet 0  . clopidogrel (PLAVIX) 75 MG tablet Take 1 tablet (75 mg total) by mouth daily with breakfast. 90 tablet 1  . levothyroxine (SYNTHROID, LEVOTHROID) 112 MCG tablet Take 224 mcg by mouth daily before breakfast.     . atorvastatin (LIPITOR)  10 MG tablet Take 1 tablet (10 mg total) by mouth daily. 90 tablet 2  . HYDROcodone-acetaminophen (NORCO/VICODIN) 5-325 MG tablet Take 1 tablet by mouth every 6 (six) hours as needed for moderate pain. (Patient not taking: Reported on 07/14/2019) 12 tablet 0   No current facility-administered medications for this visit.     Objective: BP 115/71   Pulse 80   Temp 97.9 F (36.6 C)   Ht 5\' 10"  (1.778 m)   Wt 220 lb (99.8 kg)   BMI 31.57 kg/m     Physical Exam  Lab Results:  Recent Results (from the past 2160 hour(s))  Surgical pcr  screen     Status: None   Collection Time: 04/28/19  9:31 AM   Specimen: Nasal Mucosa; Nasal Swab  Result Value Ref Range   MRSA, PCR NEGATIVE NEGATIVE   Staphylococcus aureus NEGATIVE NEGATIVE    Comment: (NOTE) The Xpert SA Assay (FDA approved for NASAL specimens in patients 52 years of age and older), is one component of a comprehensive surveillance program. It is not intended to diagnose infection nor to guide or monitor treatment. Performed at [facility], [place] [address], [place], [place] [address]   APTT     Status: Abnormal   Collection Time: 04/28/19 10:00 AM  Result Value Ref Range   aPTT 42 (H) 24 - 36 seconds    Comment:        IF BASELINE aPTT IS ELEVATED, SUGGEST PATIENT RISK ASSESSMENT BE USED TO DETERMINE APPROPRIATE ANTICOAGULANT THERAPY. Performed at [facility], [place] [address], [place], [place] [address]   Brain natriuretic peptide     Status: Abnormal   Collection Time: 04/28/19 10:00 AM  Result Value Ref Range   B Natriuretic Peptide 213.3 (H) 0.0 - 100.0 pg/mL    Comment: Performed at [facility] [address] [place], [place] [address]  CBC     Status: Abnormal   Collection Time: 04/28/19 10:00 AM  Result Value Ref Range   WBC 4.1 4.0 - 10.5 K/uL   RBC 4.18 (L) 4.22 - 5.81 MIL/uL   Hemoglobin 12.5 (L) 13.0 - 17.0 g/dL   HCT 38.2 (L) 39 - 52 %   MCV 91.4 80.0 - 100.0 fL   MCH 29.9 26.0 -  34.0 pg   MCHC 32.7 30.0 - 36.0 g/dL   RDW 12.9 11.5 - 15.5 %   Platelets 126 (L) 150 - 400 K/uL    Comment: REPEATED TO VERIFY Immature Platelet Fraction may be clinically indicated, consider ordering this additional test [id number]    nRBC 0.0 0.0 - 0.2 %    Comment: Performed at [facility], [place] [address], [place], [place] [address]  Comprehensive metabolic panel     Status: None   Collection Time: 04/28/19 10:00 AM  Result Value Ref Range   Sodium 139 135 - 145 mmol/L   Potassium 4.6 3.5 - 5.1 mmol/L   Chloride 108 98 - 111 mmol/L   CO2 23 22 - 32 mmol/L   Glucose, Bld 94 70 - 99 mg/dL    Comment: Glucose reference range applies only to samples taken after fasting for at least 8 hours.   BUN 18 8 - 23 mg/dL   Creatinine, Ser 0.91 0.61 - 1.24 mg/dL   Calcium 9.3 8.9 - 10.3 mg/dL   Total Protein 6.7 6.5 - 8.1 g/dL   Albumin 3.7 3.5 - 5.0 g/dL   AST 21 15 - 41 U/L   ALT 22 0 - 44 U/L   Alkaline Phosphatase 42 38 - 126 U/L   Total Bilirubin 1.1 0.3 - 1.2 mg/dL   GFR calc non Af Amer >60 >60 mL/min   GFR calc Af Amer >60 >60 mL/min   Anion gap 8 5 - 15    Comment: Performed at [facility], [place] [address], [place],  [address]  Hemoglobin A1c     Status: None   Collection Time: 04/28/19 10:00 AM  Result Value Ref Range   Hgb A1c MFr Bld 5.2 4.8 - 5.6 %    Comment: (NOTE) Pre diabetes:  5.7%-6.4% Diabetes:              >6.4% Glycemic control for   <7.0% adults with diabetes    Mean Plasma Glucose 102.54 mg/dL    Comment: Performed at [facility] [address], [place], [place] [address]  Protime-INR     Status: None   Collection Time: 04/28/19 10:00 AM  Result Value Ref Range   Prothrombin Time 13.6 11.4 - 15.2 seconds   INR 1.0 0.8 - 1.2    Comment: (NOTE) INR goal varies based on device and disease states. Performed at [facility], [place] [address], [place], [place] [address]   Blood gas, arterial on room air     Status:  None   Collection Time: 04/28/19 10:06 AM  Result Value Ref Range   FIO2 21.00    pH, Arterial 7.396 7.35 - 7.45   pCO2 arterial 40.6 32 - 48 mmHg   pO2, Arterial 93.1 83 - 108 mmHg   Bicarbonate 24.4 20.0 - 28.0 mmol/L   Acid-Base Excess 0.1 0.0 - 2.0 mmol/L   O2 Saturation 97.2 %   Patient temperature 37.0    Collection site RIGHT RADIAL    Drawn by COLLECTED BY RT     Comment: DRAWN BY CRNA   Allens test (pass/fail) PASS PASS    Comment: Performed at [facility], [place] [address], [place], [place] [address]  Type and screen     Status: None   Collection Time: 04/28/19 10:06 AM  Result Value Ref Range   ABO/RH(D) B NEG    Antibody Screen NEG    Sample Expiration 05/12/2019,2359    Extend sample reason      NO TRANSFUSIONS OR PREGNANCY IN THE PAST 3 MONTHS Performed at [facility], [place] [address], [place], [place] [address]   ABO/Rh     Status: None   Collection Time: 04/28/19 10:06 AM  Result Value Ref Range   ABO/RH(D)      B NEG Performed at [facility] [address] [place], [place] [address]   Urinalysis, Routine w reflex microscopic     Status: Abnormal   Collection Time: 05/02/19  6:29 AM  Result Value Ref Range   Color, Urine YELLOW YELLOW   APPearance CLEAR CLEAR   Specific Gravity, Urine 1.020 1.005 - 1.030   pH 5.0 5.0 - 8.0   Glucose, UA NEGATIVE NEGATIVE mg/dL   Hgb urine dipstick SMALL (A) NEGATIVE   Bilirubin Urine NEGATIVE NEGATIVE   Ketones, ur NEGATIVE NEGATIVE mg/dL   Protein, ur NEGATIVE NEGATIVE mg/dL   Nitrite NEGATIVE NEGATIVE   Leukocytes,Ua NEGATIVE NEGATIVE   RBC / HPF 0-5 0 - 5 RBC/hpf   WBC, UA 0-5 0 - 5 WBC/hpf   Bacteria, UA RARE (A) NONE SEEN   Mucus PRESENT     Comment: Performed at [facility] [address], [place], [place] [address]  I-STAT, [place] Clap 8     Status: Abnormal   Collection Time: 05/02/19  7:45 AM  Result Value Ref Range   Sodium 142 135 - 145 mmol/L   Potassium 4.3 3.5 - 5.1 mmol/L    Chloride 106 98 - 111 mmol/L   BUN 19 8 - 23 mg/dL   Creatinine, Ser 0.90 0.61 - 1.24 mg/dL   Glucose, Bld 98 70 - 99 mg/dL    Comment: Glucose reference range applies only to samples taken after fasting for at least  8 hours.   Calcium, Ion 1.33 1.15 - 1.40 mmol/L   TCO2 26 22 - 32 mmol/L   Hemoglobin 11.6 (L) 13.0 - 17.0 g/dL   HCT 34.0 (L) 39 - 52 %  I-STAT, chem 8     Status: Abnormal   Collection Time: 05/02/19  8:51 AM  Result Value Ref Range   Sodium 141 135 - 145 mmol/L   Potassium 4.5 3.5 - 5.1 mmol/L   Chloride 105 98 - 111 mmol/L   BUN 19 8 - 23 mg/dL   Creatinine, Ser 0.90 0.61 - 1.24 mg/dL   Glucose, Bld 109 (H) 70 - 99 mg/dL    Comment: Glucose reference range applies only to samples taken after fasting for at least 8 hours.   Calcium, Ion 1.36 1.15 - 1.40 mmol/L   TCO2 28 22 - 32 mmol/L   Hemoglobin 10.2 (L) 13.0 - 17.0 g/dL   HCT 30.0 (L) 39 - 52 %  I-STAT, chem 8     Status: Abnormal   Collection Time: 05/02/19  9:23 AM  Result Value Ref Range   Sodium 141 135 - 145 mmol/L   Potassium 4.5 3.5 - 5.1 mmol/L   Chloride 106 98 - 111 mmol/L   BUN 18 8 - 23 mg/dL   Creatinine, Ser 0.90 0.61 - 1.24 mg/dL   Glucose, Bld 110 (H) 70 - 99 mg/dL    Comment: Glucose reference range applies only to samples taken after fasting for at least 8 hours.   Calcium, Ion 1.34 1.15 - 1.40 mmol/L   TCO2 28 22 - 32 mmol/L   Hemoglobin 10.5 (L) 13.0 - 17.0 g/dL   HCT 31.0 (L) 39 - 52 %  I-STAT, chem 8     Status: Abnormal   Collection Time: 05/02/19  9:54 AM  Result Value Ref Range   Sodium 141 135 - 145 mmol/L   Potassium 4.5 3.5 - 5.1 mmol/L   Chloride 106 98 - 111 mmol/L   BUN 18 8 - 23 mg/dL   Creatinine, Ser 0.90 0.61 - 1.24 mg/dL   Glucose, Bld 103 (H) 70 - 99 mg/dL    Comment: Glucose reference range applies only to samples taken after fasting for at least 8 hours.   Calcium, Ion 1.32 1.15 - 1.40 mmol/L   TCO2 26 22 - 32 mmol/L   Hemoglobin 10.5 (L) 13.0 - 17.0 g/dL    HCT 31.0 (L) 39 - 52 %  CBC     Status: Abnormal   Collection Time: 05/03/19  2:45 AM  Result Value Ref Range   WBC 5.1 4.0 - 10.5 K/uL   RBC 3.63 (L) 4.22 - 5.81 MIL/uL   Hemoglobin 10.7 (L) 13.0 - 17.0 g/dL   HCT 33.5 (L) 39 - 52 %   MCV 92.3 80.0 - 100.0 fL   MCH 29.5 26.0 - 34.0 pg   MCHC 31.9 30.0 - 36.0 g/dL   RDW 12.9 11.5 - 15.5 %   Platelets 110 (L) 150 - 400 K/uL    Comment: REPEATED TO VERIFY PLATELET COUNT CONFIRMED BY SMEAR SPECIMEN CHECKED FOR CLOTS Immature Platelet Fraction may be clinically indicated, consider ordering this additional test [id number]    nRBC 0.0 0.0 - 0.2 %    Comment: Performed at [facility], [place] [address], [place], [place] [address]  Basic metabolic panel     Status: Abnormal   Collection Time: 05/03/19  2:45 AM  Result Value Ref Range   Sodium  138 135 - 145 mmol/L   Potassium 4.4 3.5 - 5.1 mmol/L   Chloride 107 98 - 111 mmol/L   CO2 24 22 - 32 mmol/L   Glucose, Bld 104 (H) 70 - 99 mg/dL    Comment: Glucose reference range applies only to samples taken after fasting for at least 8 hours.   BUN 17 8 - 23 mg/dL   Creatinine, Ser 1.01 0.61 - 1.24 mg/dL   Calcium 8.6 (L) 8.9 - 10.3 mg/dL   GFR calc non Af Amer >60 >60 mL/min   GFR calc Af Amer >60 >60 mL/min   Anion gap 7 5 - 15    Comment: Performed at [facility] [address] [place], [place] [address]  Magnesium     Status: None   Collection Time: 05/03/19  2:45 AM  Result Value Ref Range   Magnesium 1.8 1.7 - 2.4 mg/dL    Comment: Performed at [facility] [address] [place], [place] [address]  ECHOCARDIOGRAM COMPLETE     Status: None   Collection Time: 05/03/19 10:41 AM  Result Value Ref Range   Weight 3,478.4 oz   Height 70 in   BP 122/60 mmHg  Cytology - Non PAP; RT LOBE THYROID     Status: None   Collection Time: 05/31/19  4:45 PM  Result Value Ref Range   CYTOLOGY - NON GYN      CYTOLOGY - NON PAP CASE: [id number] PATIENT: Burton Apley Non-Gynecological Cytology Report     Clinical History: Right Inferior 1.6cm;  Other 2 dimensions: 1.6 x 1.5 cm solid/almost completely solid hypoechoic TI-RADS- 4 Specimen Submitted:  A. THYROID, RLP, FINE NEEDLE ASPIRATION:   FINAL MICROSCOPIC DIAGNOSIS: - Consistent with lymphocytic (Hashimoto) thyroiditis in the proper clinical context (Bethesda category II)  SPECIMEN ADEQUACY: Satisfactory for evaluation  GROSS: Received is/are 30cc's of light pink cytolyt solution and 6 slides in 95% ethyl alcohol.(MW:mw) Smears: 6 Concentration Method (Thin Prep):1 Cell Block: Cell block attempted but not obtained Additional Studies: Afirma collected     Final Diagnosis performed by Enid Cutter, MD.   Electronically signed 06/01/2019 Technical and / or Professional components performed at [facility]. [facility], [place] [address], [place], [place] [address].  Immunohistochemistry Technical compon ent (if applicable) was performed at [facility]. [address], [place], [place], [place] [address].   IMMUNOHISTOCHEMISTRY DISCLAIMER (if applicable): Some of these immunohistochemical stains may have been developed and the performance characteristics determine by [facility]. Some may not have been cleared or approved by the U.S. Food and Drug Administration. The FDA has determined that such clearance or approval is not necessary. This test is used for clinical purposes. It should not be regarded as investigational or for research. This laboratory is certified under the [place] (CLIA-88) as qualified to perform high complexity clinical laboratory testing.  The controls stained appropriately.   POCT urinalysis dipstick     Status: Abnormal   Collection Time: 07/14/19  2:49 PM  Result Value Ref Range   Color, UA dk yellow    Clarity, UA clear    Glucose, UA Negative Negative   Bilirubin, UA  small    Ketones, UA neg    Spec Grav, UA >=1.030 (A) 1.010 - 1.025   Blood, UA neg    pH, UA 5.0 5.0 - 8.0   Protein, UA Negative Negative   Urobilinogen, UA negative (A) 0.2 or  1.0 E.U./dL   Nitrite, UA neg    Leukocytes, UA Negative Negative   Appearance     Odor       BMET No results for input(s): NA, K, CL, CO2, GLUCOSE, BUN, CREATININE, CALCIUM in the last 72 hours. PT/INR No results for input(s): LABPROT, INR in the last 72 hours. ABG No results for input(s): PHART, HCO3 in the last 72 hours.  Invalid input(s): PCO2, PO2  Studies/Results: IMPRESSION: 1. Vascular findings and measurements pertinent to potential TAVR procedure, as detailed. 2. Diffuse thickening and coarse calcification of the aortic valve, compatible with the reported history of severe symptomatic aortic stenosis. 3. Obstructing 7 mm left UPJ stone with mild-to-moderate left hydronephrosis. 4. Small basilar right pleural effusion with smooth right pleural thickening, suggesting chronic loculated right pleural effusion. 5. Findings at the right lung base most compatible with rounded atelectasis. Follow-up chest CT advised in 3 months to document stability of this finding. 6. Mild cardiomegaly. Coronary atherosclerosis. 7. Nonspecific mild diffuse bladder wall thickening, probably due to chronic bladder outlet obstruction by the mildly enlarged prostate. 8. Right thyroid 1.5 cm nodule. Thyroid ultrasound correlation  KUB 03/10/19  The LUPJ stone is visible on the KUB from today.   It measures 16 x 67mm.  indicated if not previously performed.(Ref: J Am Coll Radiol. 2015 Feb;12(2): 143-50). 9. Aortic Atherosclerosis (ICD10-I70.0).  These results will be called to the ordering clinician or representative by the Radiologist Assistant, and communication documented in the PACS or zVision Dashboard.   Electronically Signed   By: Ilona Sorrel M.D.   On: 03/06/2019 13:04  Procedure:   He was  prepped with betadine and the urethra was instilled with lidocaine.  He was given Cipro 500mg .  The flexible cystoscope was passed and the left ureteral stent was grasped and removed.  No new findings were identified.    Assessment/Plan: 16 x 69mm left UPJ stone with obstruction.  He is doing well sp left ureteroscopy and his stent.  Renal US was ok and UA is clear. He will return in 1 year with a KUB.   No orders of the defined types were placed in this encounter.    Orders Placed This Encounter  Procedures  . DG Abd 1 View    Standing Status:   Future    Standing Expiration Date:   07/12/2020    Order Specific Question:   Reason for Exam (SYMPTOM  OR DIAGNOSIS REQUIRED)    Answer:   history of stones    Order Specific Question:   Preferred imaging location?    Answer:   [facility]    Order Specific Question:   Radiology Contrast Protocol - do NOT remove file path    Answer:   [email or web address]  . POCT urinalysis dipstick     Return in about 1 year (around 07/13/2020) for With KUB.    CC: Dr. Antony Contras, Dr. Arvid Right and Dr. Lauree Chandler.      Irine Seal 07/14/2019 [phone]

## 2019-08-11 ENCOUNTER — Telehealth: Payer: Self-pay | Admitting: Cardiology

## 2019-08-11 NOTE — Telephone Encounter (Signed)
LMTCB-cc 

## 2019-08-11 NOTE — Telephone Encounter (Signed)
CoQ10 should not be causing cramping, it is mainly used to help prevent cramps when patients take statins. He can stop the CoQ10 if he would like as it does not have any cardiac benefit and the efficacy data for muscle cramp relief is limited.  Atorvastatin is the more likely culprit of his muscle pain. Would recommend stopping atorvastatin for 1 - 2 weeks and monitor for any improvement in symptoms. If he starts to feel better, would try rosuvastatin 10mg  daily instead as this is typically better tolerated.

## 2019-08-11 NOTE — Telephone Encounter (Signed)
LMTCB   I will forward to pharmacist for input.

## 2019-08-11 NOTE — Telephone Encounter (Signed)
New message    Patient is having a lot of cramping he thinks its the C) Q 10 that is causing it please call him

## 2019-08-14 MED ORDER — ATORVASTATIN CALCIUM 10 MG PO TABS: 10.0000 mg | ORAL_TABLET | Freq: Every day | ORAL | 2 refills | Status: DC

## 2019-08-14 NOTE — Telephone Encounter (Signed)
Pt will hold atorvastatin for 1-2 weeks and then call back and let us know if he feels better.

## 2019-08-18 ENCOUNTER — Ambulatory Visit (HOSPITAL_COMMUNITY): Payer: Medicare Other

## 2019-08-21 ENCOUNTER — Telehealth: Payer: Self-pay | Admitting: Cardiology

## 2019-08-21 NOTE — Telephone Encounter (Signed)
Pt states he is returning a call re:his medications   Please call [phone] (he will be away from his phone 3:15-4:00pm)   Thanks renee

## 2019-08-21 NOTE — Telephone Encounter (Signed)
Patient called to say he had stopped taking plavix instead of his atorvastatin. He also states he no longer has cramps. We discussed all medications again and he verbalized understanding that he should be taking plavix, aspirin, atorvastatin, and levothyroxine

## 2019-09-12 DIAGNOSIS — R252 Cramp and spasm: Secondary | ICD-10-CM | POA: Diagnosis not present

## 2019-09-12 DIAGNOSIS — E7800 Pure hypercholesterolemia, unspecified: Secondary | ICD-10-CM | POA: Diagnosis not present

## 2019-09-12 DIAGNOSIS — E039 Hypothyroidism, unspecified: Secondary | ICD-10-CM | POA: Diagnosis not present

## 2019-09-12 DIAGNOSIS — K30 Functional dyspepsia: Secondary | ICD-10-CM | POA: Diagnosis not present

## 2019-11-02 ENCOUNTER — Ambulatory Visit (HOSPITAL_COMMUNITY)
Admission: RE | Admit: 2019-11-02 | Discharge: 2019-11-02 | Disposition: A | Discharge status: Home / Self Care | Payer: Medicare Other | Source: Ambulatory Visit | Attending: Physician Assistant | Admitting: Physician Assistant

## 2019-11-02 ENCOUNTER — Other Ambulatory Visit: Payer: Self-pay | Admitting: Cardiovascular Disease

## 2019-11-02 DIAGNOSIS — J9811 Atelectasis: Secondary | ICD-10-CM | POA: Diagnosis not present

## 2019-11-02 DIAGNOSIS — R9389 Abnormal findings on diagnostic imaging of other specified body structures: Secondary | ICD-10-CM | POA: Insufficient documentation

## 2019-11-02 DIAGNOSIS — J90 Pleural effusion, not elsewhere classified: Secondary | ICD-10-CM | POA: Diagnosis not present

## 2019-11-02 NOTE — Telephone Encounter (Signed)
Received a refill request for Plavix. Per Andrew Watson last office note he is to d/c this 10/2019... I denied request and removed medication from his med list.

## 2019-12-05 DIAGNOSIS — E039 Hypothyroidism, unspecified: Secondary | ICD-10-CM | POA: Diagnosis not present

## 2019-12-19 ENCOUNTER — Encounter (INDEPENDENT_AMBULATORY_CARE_PROVIDER_SITE_OTHER): Payer: Self-pay | Admitting: *Deleted

## 2020-01-11 ENCOUNTER — Encounter: Payer: Self-pay | Admitting: Cardiology

## 2020-01-11 ENCOUNTER — Other Ambulatory Visit: Payer: Self-pay

## 2020-01-11 ENCOUNTER — Ambulatory Visit: Payer: Medicare Other | Admitting: Cardiology

## 2020-01-11 VITALS — BP 124/76 | HR 61 | Ht 70.0 in | Wt 223.0 lb

## 2020-01-11 DIAGNOSIS — Z952 Presence of prosthetic heart valve: Secondary | ICD-10-CM | POA: Diagnosis not present

## 2020-01-11 DIAGNOSIS — I2510 Atherosclerotic heart disease of native coronary artery without angina pectoris: Secondary | ICD-10-CM

## 2020-01-11 NOTE — Progress Notes (Signed)
Cardiology Office Note  Date: 01/11/2020   ID: Jeffren, Dombek 1940/02/08, MRN [id number]  PCP:  Antony Contras, MD  Cardiologist:  Rozann Lesches, MD Electrophysiologist:  None   Chief Complaint  Patient presents with  . Cardiac follow-up    History of Present Illness: Andrew Watson is an 79 y.o. male last seen in June by Ms. Strader PA-C.  He is here for a routine visit.  Continues to do very well, pleased with his stamina and activity level.  No exertional angina or breathlessness beyond NYHA class II.  Echocardiogram in May revealed normally functioning TAVR prosthesis with mean gradient 15 mmHg and no paravalvular regurgitation.  Follow-up chest CT in October showed stable appearing rounded atelectasis in the right lower lobe. No additional imaging studies are planned at this point.  I reviewed his medications which are outlined below.  Past Medical History:  Diagnosis Date  . Aortic valve stenosis   . Arthritis   . CAD (coronary artery disease) 05/07/2011   Mild atherosclerosis by cardiac catheterization February 2021  . GERD (gastroesophageal reflux disease)   . History of atrial fibrillation 2013   Reportedly limited episode  . History of kidney stones   . History of MRSA infection   . History of pancreatitis 2017   [facility]  . History of pulmonary embolism 2017  . History of TIA (transient ischemic attack)    November 2020  . Hypothyroidism   . S/P TAVR (transcatheter aortic valve replacement) 05/02/2019   s/p TAVR with a 26 mm Edwards Sapien 3 Ultra via the TF approach with Dr. Buena Irish & Dr. Cyndia Bent     Past Surgical History:  Procedure Laterality Date  . BACK SURGERY    . CARDIAC CATHETERIZATION    . CHOLECYSTECTOMY    . COLONOSCOPY  07/08/2011   Procedure: COLONOSCOPY;  Surgeon: Rogene Houston, MD;  [facility];  Service: Endoscopy;  Laterality: N/A;  930  . CYSTOSCOPY WITH RETROGRADE PYELOGRAM, URETEROSCOPY AND STENT  PLACEMENT Left 03/30/2019   Procedure: CYSTOSCOPY WITH LEFT  RETROGRADE , URETEROSCOPY HOLMIUM LASER AND STENT PLACEMENT;  Surgeon: Irine Seal, MD;  [facility];  Service: Urology;  Laterality: Left;  . DUODENOTOMY  09/29/2016   repair of fistula, jejunal resection  . INGUINAL HERNIA REPAIR N/A 03/01/2018   Procedure: LAPAROSCOPIC TEP BILATERAL INGUINAL HERNIA REPAIR;  Surgeon: Johnathan Hausen, MD;  [facility];  Service: General;  Laterality: N/A;  . INTRAOPERATIVE TRANSTHORACIC ECHOCARDIOGRAM N/A 05/02/2019   Procedure: Intraoperative Transthoracic Echocardiogram;  Surgeon: Burnell Blanks, MD;  Location: [place];  Service: Open Heart Surgery;  Laterality: N/A;  . JEJUNOSTOMY FEEDING TUBE    . KNEE ARTHROSCOPY Left   . LEFT HEART CATHETERIZATION WITH CORONARY ANGIOGRAM N/A 05/07/2011   Procedure: LEFT HEART CATHETERIZATION WITH CORONARY ANGIOGRAM;  Surgeon: Peter M Martinique, MD;  Location: [facility] CATH LAB;  Service: Cardiovascular;  Laterality: N/A;  . PEG TUBE PLACEMENT    . RIGHT/LEFT HEART CATH AND CORONARY ANGIOGRAPHY N/A 02/28/2019   Procedure: RIGHT/LEFT HEART CATH AND CORONARY ANGIOGRAPHY;  Surgeon: Burnell Blanks, MD;  Location: [place] CV LAB;  Service: Cardiovascular;  Laterality: N/A;  . TRANSCATHETER AORTIC VALVE REPLACEMENT, TRANSFEMORAL  05/02/2019  . TRANSCATHETER AORTIC VALVE REPLACEMENT, TRANSFEMORAL N/A 05/02/2019   Procedure: TRANSCATHETER AORTIC VALVE REPLACEMENT, TRANSFEMORAL;  Surgeon: Burnell Blanks, MD;  Location: [place];  Service: Open Heart Surgery;  Laterality: N/A;  . VASECTOMY  Current Outpatient Medications  Medication Sig Dispense Refill  . aspirin 81 MG chewable tablet Chew 1 tablet (81 mg total) by mouth daily. 30 tablet 0  . atorvastatin (LIPITOR) 10 MG tablet Take 1 tablet (10 mg total) by mouth daily. 90 tablet 2  . levothyroxine (SYNTHROID, LEVOTHROID) 112 MCG tablet Take 224 mcg by mouth daily before breakfast.      No  current facility-administered medications for this visit.   Allergies:  Patient has no known allergies.   ROS: No syncope.  Physical Exam: VS:  BP 124/76   Pulse 61   Ht 5\' 10"  (1.778 m)   Wt 223 lb (101.2 kg)   SpO2 97%   BMI 32.00 kg/m , BMI Body mass index is 32 kg/m.  Wt Readings from Last 3 Encounters:  01/11/20 223 lb (101.2 kg)  07/14/19 220 lb (99.8 kg)  07/12/19 219 lb (99.3 kg)    General: Elderly male, appears comfortable at rest. HEENT: Conjunctiva and lids normal, wearing a mask. Neck: Supple, no elevated JVP or carotid bruits, no thyromegaly. Lungs: Clear to auscultation, nonlabored breathing at rest. Cardiac: Regular rate and rhythm, no S3, 2/6 systolic murmur. Extremities: No pitting edema.  ECG:  An ECG dated 05/10/2019 was personally reviewed today and demonstrated:  Sinus rhythm with prolonged PR interval, nonspecific T wave changes.  Recent Labwork: 04/28/2019: ALT 22; AST 21; B Natriuretic Peptide 213.3 05/03/2019: BUN 17; Creatinine, Ser 1.01; Hemoglobin 10.7; Magnesium 1.8; Platelets 110; Potassium 4.4; Sodium 138     Component Value Date/Time   CHOL 116 12/06/2018 1238   TRIG 25 12/06/2018 1238   HDL 45 12/06/2018 1238   CHOLHDL 2.6 12/06/2018 1238   VLDL 5 12/06/2018 1238   LDLCALC 66 12/06/2018 1238    Other Studies Reviewed Today:  Echocardiogram 06/09/2019: 1. Left ventricular ejection fraction, by estimation, is 65 to 70%. The  left ventricle has normal function. The left ventricle has no regional  wall motion abnormalities. There is mild concentric left ventricular  hypertrophy. Left ventricular diastolic  parameters are consistent with Grade II diastolic dysfunction  (pseudonormalization).  2. Right ventricular systolic function is normal. The right ventricular  size is normal. Tricuspid regurgitation signal is inadequate for assessing  PA pressure.  3. The mitral valve is grossly normal. No evidence of mitral valve  regurgitation.   4. The aortic valve has been repaired/replaced. Aortic valve  regurgitation is not visualized. No aortic stenosis is present. There is a  26 mm Edwards Sapien prosthetic (TAVR) valve present in the aortic  position. Procedure Date: 05/02/2019. Echo findings  are consistent with normal structure and function of the aortic valve  prosthesis. Aortic valve mean gradient measures 15.0 mmHg. Aortic valve  Vmax measures 2.63 m/s.  Chest CT  11/02/2019: IMPRESSION: 1. No acute findings are noted in the thorax. 2. Small chronic right pleural effusion with rounded atelectasis in the right lower lobe, similar to the prior study. 3. Aortic atherosclerosis, in addition to two vessel coronary artery disease. 4. Status post TAVR. 5. Additional incidental findings, as above.  Aortic Atherosclerosis (ICD10-I70.0).  Assessment and Plan:  1.  Aortic valve stenosis status post TAVR in April of this year.  He is doing very well at this point with good stamina and activity level, NYHA class II dyspnea.  Follow-up echocardiogram in May showed normal prosthetic function with mean gradient 15 mmHg, no paravalvular regurgitation.  We will see him back in 6 months with a repeat echocardiogram.  2.  Mild coronary atherosclerosis by cardiac catheterization in February.  No angina symptoms.  Continue aspirin and statin.  Medication Adjustments/Labs and Tests Ordered: Current medicines are reviewed at length with the patient today.  Concerns regarding medicines are outlined above.   Tests Ordered: Orders Placed This Encounter  Procedures  . ECHOCARDIOGRAM COMPLETE    Medication Changes: No orders of the defined types were placed in this encounter.   Disposition:  Follow up 6 months in the [place] office.  Signed, Satira Sark, MD, [facility] 01/11/2020 1:08 PM    [facility] at [facility] [address] [place], [place] [address] Phone: [phone]; Fax: [phone]

## 2020-01-11 NOTE — Patient Instructions (Signed)
Medication Instructions:  Your physician recommends that you continue on your current medications as directed. Please refer to the Current Medication list given to you today.  *If you need a refill on your cardiac medications before your next appointment, please call your pharmacy*   Lab Work: None Today  If you have labs (blood work) drawn today and your tests are completely normal, you will receive your results only by: [name] Kitchen MyChart Message (if you have MyChart) OR . A paper copy in the mail If you have any lab test that is abnormal or we need to change your treatment, we will call you to review the results.   Testing/Procedures: Your physician has requested that you have an echocardiogram. Echocardiography is a painless test that uses sound waves to create images of your heart. It provides your doctor with information about the size and shape of your heart and how well your heart's chambers and valves are working. This procedure takes approximately one hour. There are no restrictions for this procedure.     Follow-Up: At [facility], you and your health needs are our priority.  As part of our continuing mission to provide you with exceptional heart care, we have created designated Provider Care Teams.  These Care Teams include your primary Cardiologist (physician) and Advanced Practice Providers (APPs -  Physician Assistants and Nurse Practitioners) who all work together to provide you with the care you need, when you need it.  We recommend signing up for the patient portal called "MyChart".  Sign up information is provided on this After Visit Summary.  MyChart is used to connect with patients for Virtual Visits (Telemedicine).  Patients are able to view lab/test results, encounter notes, upcoming appointments, etc.  Non-urgent messages can be sent to your provider as well.   To learn more about what you can do with MyChart, go to [email or web address].    Your next appointment:   6  month(s)  The format for your next appointment:   In Person  Provider:   Rozann Lesches, MD   Other Instructions None Today

## 2020-03-25 ENCOUNTER — Other Ambulatory Visit: Payer: Self-pay | Admitting: Physician Assistant

## 2020-03-25 DIAGNOSIS — Z952 Presence of prosthetic heart valve: Secondary | ICD-10-CM

## 2020-04-09 ENCOUNTER — Ambulatory Visit (INDEPENDENT_AMBULATORY_CARE_PROVIDER_SITE_OTHER): Payer: Medicare Other | Admitting: Internal Medicine

## 2020-05-07 NOTE — Progress Notes (Signed)
[facility]                                       Cardiology Office Note    Date:  05/08/2020   ID:  Andrew Watson, DOB July 09, 1940, MRN [id number]  PCP:  Antony Contras, MD  Cardiologist:  Dr. [name] / Dr. Angelena Form & Dr. Cyndia Bent (TAVR)  CC: 1 year s/p TAVR  History of Present Illness:  Andrew Watson is a 80 y.o. male with a history of GERD, TIA (11/2018), remote atrial fibrillation (not on [facility]), renal stone with hydronephrosis requiring ureteroscopic stone extraction/stenting, history of PE, hypothyroidism, and severe aortic stenosis s/p TAVR (05/02/19) who presents to clinic for follow up.   He has been followed for aortic stenosis which progressed to severe in 11/2018.Echo at that time showed an aortic gradient of 53 mmHg and peak gradient of 77 mmHg. Aortic valve area was 0.57 cm with a dimensionless index of 0.18. The aortic valve was severely thickened and calcified with restricted mobility of the leaflets. Diagnostic cath 02/28/19 showed mild non obst CAD and confirmed severe AS. Pre TAVR CT scans showed a 7x 60mm left UPJ stonewith obstruction. This was treated by Dr. Jeffie Pollock.   He was evaluated by the multidisciplinary valve team and underwent successful TAVR with a 26 mm Edwards Sapien 3 THV via the TF approach on 05/02/19. Post operative echo showed EF 65%, normally functioning TAVR with a mean gradient of 12.32mmg and no PVL. He was discharged on aspirin and plavix. 1 month echo showed EF 65-70%, mild concentric LVH, G2DD, normally functioning TAVR with a mean gradient of 15 mm hg and no PVL.   Today he presents to clinic for follow up. No CP or SOB. No LE edema, orthopnea or PND. No dizziness or syncope. No blood in stool or urine. No palpitations. Staying busy traveling and working out in the yard. Does struggle with leg cramps.    Past Medical History:  Diagnosis Date  . Aortic valve stenosis   . Arthritis   . CAD  (coronary artery disease) 05/07/2011   Mild atherosclerosis by cardiac catheterization February 2021  . GERD (gastroesophageal reflux disease)   . History of atrial fibrillation 2013   Reportedly limited episode  . History of kidney stones   . History of MRSA infection   . History of pancreatitis 2017   [facility]  . History of pulmonary embolism 2017  . History of TIA (transient ischemic attack)    November 2020  . Hypothyroidism   . S/P TAVR (transcatheter aortic valve replacement) 05/02/2019   s/p TAVR with a 26 mm Edwards Sapien 3 Ultra via the TF approach with Dr. Buena Irish & Dr. Cyndia Bent     Past Surgical History:  Procedure Laterality Date  . BACK SURGERY    . CARDIAC CATHETERIZATION    . CHOLECYSTECTOMY    . COLONOSCOPY  07/08/2011   Procedure: COLONOSCOPY;  Surgeon: Rogene Houston, MD;  [facility];  Service: Endoscopy;  Laterality: N/A;  930  . CYSTOSCOPY WITH RETROGRADE PYELOGRAM, URETEROSCOPY AND STENT PLACEMENT Left 03/30/2019   Procedure: CYSTOSCOPY WITH LEFT  RETROGRADE , URETEROSCOPY HOLMIUM LASER AND STENT PLACEMENT;  Surgeon: Irine Seal, MD;  [facility];  Service: Urology;  Laterality: Left;  . DUODENOTOMY  09/29/2016   repair of fistula, jejunal resection  .  INGUINAL HERNIA REPAIR N/A 03/01/2018   Procedure: LAPAROSCOPIC TEP BILATERAL INGUINAL HERNIA REPAIR;  Surgeon: Johnathan Hausen, MD;  [facility];  Service: General;  Laterality: N/A;  . INTRAOPERATIVE TRANSTHORACIC ECHOCARDIOGRAM N/A 05/02/2019   Procedure: Intraoperative Transthoracic Echocardiogram;  Surgeon: Burnell Blanks, MD;  Location: [place];  Service: Open Heart Surgery;  Laterality: N/A;  . JEJUNOSTOMY FEEDING TUBE    . KNEE ARTHROSCOPY Left   . LEFT HEART CATHETERIZATION WITH CORONARY ANGIOGRAM N/A 05/07/2011   Procedure: LEFT HEART CATHETERIZATION WITH CORONARY ANGIOGRAM;  Surgeon: Peter M Martinique, MD;  Location: [facility] CATH LAB;  Service: Cardiovascular;  Laterality: N/A;   . PEG TUBE PLACEMENT    . RIGHT/LEFT HEART CATH AND CORONARY ANGIOGRAPHY N/A 02/28/2019   Procedure: RIGHT/LEFT HEART CATH AND CORONARY ANGIOGRAPHY;  Surgeon: Burnell Blanks, MD;  Location: [place] CV LAB;  Service: Cardiovascular;  Laterality: N/A;  . TRANSCATHETER AORTIC VALVE REPLACEMENT, TRANSFEMORAL  05/02/2019  . TRANSCATHETER AORTIC VALVE REPLACEMENT, TRANSFEMORAL N/A 05/02/2019   Procedure: TRANSCATHETER AORTIC VALVE REPLACEMENT, TRANSFEMORAL;  Surgeon: Burnell Blanks, MD;  Location: [place];  Service: Open Heart Surgery;  Laterality: N/A;  . VASECTOMY      Current Medications: Outpatient Medications Prior to Visit  Medication Sig Dispense Refill  . amoxicillin (AMOXIL) 500 MG capsule Take 500 mg by mouth as directed. Before dental appt    . aspirin 81 MG chewable tablet Chew 1 tablet (81 mg total) by mouth daily. 30 tablet 0  . atorvastatin (LIPITOR) 10 MG tablet Take 1 tablet (10 mg total) by mouth daily. 90 tablet 2  . chlorhexidine (PERIDEX) 0.12 % solution as needed.    [name] Kitchen levothyroxine (SYNTHROID, LEVOTHROID) 112 MCG tablet Take 224 mcg by mouth daily before breakfast.      Facility-Administered Medications Prior to Visit  Medication Dose Route Frequency Provider Last Rate Last Admin  . perflutren lipid microspheres (DEFINITY) IV suspension  1-10 mL Intravenous PRN Antony Contras, MD   2 mL at 05/08/20 1450     Allergies:   Patient has no known allergies.   Social History   Socioeconomic History  . Marital status: Widowed    Spouse name: [name]  . Number of children: Not on file  . Years of education: Not on file  . Highest education level: High school graduate  Occupational History    Comment: retired   Tobacco Use  . Smoking status: Never Smoker  . Smokeless tobacco: Never Used  Vaping Use  . Vaping Use: Never used  Substance and Sexual Activity  . Alcohol use: No    Comment: none since Jun 26, 2015  . Drug use: No  . Sexual activity: Not on  file  Other Topics Concern  . Not on file  Social History Narrative   Lives with wife   Caffeine- coffee 2 c daily, usually decaf   Social Determinants of Health   Financial Resource Strain: Not on file  Food Insecurity: Not on file  Transportation Needs: Not on file  Physical Activity: Not on file  Stress: Not on file  Social Connections: Not on file     Family History:  The patient's family history includes Arthritis in his mother; Other in his brother.     ROS:   Please see the history of present illness.    ROS All other systems reviewed and are negative.   PHYSICAL EXAM:   VS:  BP 120/70   Pulse 89   Ht 5\' 9"  (1.753  m)   Wt 220 lb (99.8 kg)   SpO2 96%   BMI 32.49 kg/m    GEN: Well nourished, well developed, in no acute distress HEENT: normal Neck: no JVD or masses Cardiac: RRR; soft flow murmurs. no rubs, or gallops,no edema  Respiratory:  clear to auscultation bilaterally, normal work of breathing GI: soft, nontender, nondistended, + BS MS: no deformity or atrophy Skin: warm and dry, no rash.  Groin sites clear without hematoma or ecchymosis  Neuro:  Alert and Oriented x 3, Strength and sensation are intact Psych: euthymic mood, full affect   Wt Readings from Last 3 Encounters:  05/08/20 220 lb (99.8 kg)  01/11/20 223 lb (101.2 kg)  07/14/19 220 lb (99.8 kg)      Studies/Labs Reviewed:   EKG:  EKG is NOT ordered today.    Recent Labs: No results found for requested labs within last 8760 hours.   Lipid Panel    Component Value Date/Time   CHOL 116 12/06/2018 1238   TRIG 25 12/06/2018 1238   HDL 45 12/06/2018 1238   CHOLHDL 2.6 12/06/2018 1238   VLDL 5 12/06/2018 1238   LDLCALC 66 12/06/2018 1238    Additional studies/ records that were reviewed today include:  TAVR OPERATIVE NOTE   Date of Procedure:05/02/2019  Preoperative Diagnosis:Severe Aortic Stenosis   Postoperative Diagnosis:Same    Procedure:   Transcatheter Aortic Valve Replacement - PercutaneousRightTransfemoral Approach Edwards Sapien 3 Ultra THV (size 63mm, model # [id number], serial # [id number])  Co-Surgeons:Bryan Alveria Apley, MD and Lauree Chandler, MD  Anesthesiologist:K. Smith Robert, MD  Echocardiographer:M. Croitoru, MD  Pre-operative Echo Findings: ? Severe aortic stenosis ? Normalleft ventricular systolic function  Post-operative Echo Findings: ? Noparavalvular leak ? Normalleft ventricular systolic function  _____________    Echo 05/03/19 IMPRESSIONS  1. Left ventricular ejection fraction, by estimation, is 65 to 70%. The  left ventricle has normal function. The left ventricle has no regional  wall motion abnormalities. Left ventricular diastolic parameters are  consistent with Grade II diastolic  dysfunction (pseudonormalization). Elevated left atrial pressure.  2. Right ventricular systolic function is normal. The right ventricular  size is normal.  3. Left atrial size was severely dilated.  4. The mitral valve is normal in structure. No evidence of mitral valve  regurgitation. No evidence of mitral stenosis.  5. The aortic valve has been repaired/replaced. Aortic valve  regurgitation is not visualized. No aortic stenosis is present. There is a  26 mm Edwards Sapien prosthetic (TAVR) valve present in the aortic  position. Procedure Date: 05/02/2019. Echo findings  are consistent with normal structure and function of the aortic valve  prosthesis. Aortic valve mean gradient measures 12.8 mmHg. Aortic valve  Vmax measures 2.52 m/s.  6. The inferior vena cava is normal in size with greater than 50%  respiratory variability, suggesting right atrial pressure of 3 mmHg.   _______________________  Echo 05/30/19 IMPRESSIONS 1. Left ventricular ejection fraction, by estimation, is 65 to 70%. The  left  ventricle has normal function. The left ventricle has no regional  wall motion abnormalities. There is mild concentric left ventricular  hypertrophy. Left ventricular diastolic  parameters are consistent with Grade II diastolic dysfunction  (pseudonormalization).  2. Right ventricular systolic function is normal. The right ventricular  size is normal. Tricuspid regurgitation signal is inadequate for assessing  PA pressure.  3. The mitral valve is grossly normal. No evidence of mitral valve  regurgitation.  4. The aortic valve has been repaired/replaced. Aortic  valve  regurgitation is not visualized. No aortic stenosis is present. There is a  26 mm Edwards Sapien prosthetic (TAVR) valve present in the aortic  position. Procedure Date: 05/02/2019. Echo findings  are consistent with normal structure and function of the aortic valve  prosthesis. Aortic valve mean gradient measures 15.0 mmHg. Aortic valve  Vmax measures 2.63 m/s.   _______________________   Echo 05/08/20 IMPRESSIONS  1. Left ventricular ejection fraction, by estimation, is 60 to 65%. The  left ventricle has normal function. The left ventricle has no regional  wall motion abnormalities. There is mild left ventricular hypertrophy.  Left ventricular diastolic parameters  are consistent with Grade I diastolic dysfunction (impaired relaxation).  2. Right ventricular systolic function is normal. The right ventricular  size is normal. Peak RV-RA gradient 18 mmHg.  3. The mitral valve is normal in structure. No evidence of mitral valve  regurgitation. No evidence of mitral stenosis.  4. Bioprosthetic aortic valve s/p TAVR with 26 mm Edwards Sapien THV. No  peri-valvular leakage noted. Mean gradient 7 mmHg.  5. the IVC was not visualized.  6. Technically difficult study with poor acoustic windows.   ASSESSMENT & PLAN:   Severe AS s/p TAVR:echo today showed EF 60%, normally functioning TAVR with a mean gradient of 7  mmHg and no PVL. He has NYHA class I symptoms. SBE prophylaxis discussed; he has amoxicillin. Continue on a baby aspirin 81 mg daily.   Chronic diastolic CHF: appears euvolemic off diuretic therapy.   Abnormal chest CT: pre TAVR CT reported findings at the right lung base most compatible with rounded atelectasis. Follow-up chest CT was reassuring and no follow up was required.  Thyroid nodule: pre TAVR CT showed a 1.5 cm thyroid nodule. He underwent FNA which showed thyroiditis.    Medication Adjustments/Labs and Tests Ordered: Current medicines are reviewed at length with the patient today.  Concerns regarding medicines are outlined above.  Medication changes, Labs and Tests ordered today are listed in the Patient Instructions below. Patient Instructions  Medication Instructions:  Your physician recommends that you continue on your current medications as directed. Please refer to the Current Medication list given to you today.  *If you need a refill on your cardiac medications before your next appointment, please call your pharmacy*   Lab Work: None ordered   Testing/Procedures: None ordered   Follow-Up: At [facility], you and your health needs are our priority.  As part of our continuing mission to provide you with exceptional heart care, we have created designated Provider Care Teams.  These Care Teams include your primary Cardiologist (physician) and Advanced Practice Providers (APPs -  Physician Assistants and Nurse Practitioners) who all work together to provide you with the care you need, when you need it.  We recommend signing up for the patient portal called "MyChart".  Sign up information is provided on this After Visit Summary.  MyChart is used to connect with patients for Virtual Visits (Telemedicine).  Patients are able to view lab/test results, encounter notes, upcoming appointments, etc.  Non-urgent messages can be sent to your provider as well.   To learn more  about what you can do with MyChart, go to [email or web address].    Your next appointment:    June/July  The format for your next appointment:   In Person  Provider:   Rozann Lesches, MD    Thank you for choosing [facility]!!          Signed, Angelena Form, PA-C  05/08/2020 3:39 PM    [facility] [address], [place], [place]  [address] Phone: [phone]; Fax: [phone]

## 2020-05-08 ENCOUNTER — Ambulatory Visit (INDEPENDENT_AMBULATORY_CARE_PROVIDER_SITE_OTHER): Payer: Medicare Other | Admitting: Physician Assistant

## 2020-05-08 ENCOUNTER — Other Ambulatory Visit: Payer: Self-pay

## 2020-05-08 ENCOUNTER — Ambulatory Visit (HOSPITAL_COMMUNITY): Payer: Medicare Other | Attending: Cardiology

## 2020-05-08 ENCOUNTER — Encounter: Payer: Self-pay | Admitting: Physician Assistant

## 2020-05-08 VITALS — BP 120/70 | HR 89 | Ht 69.0 in | Wt 220.0 lb

## 2020-05-08 DIAGNOSIS — I5032 Chronic diastolic (congestive) heart failure: Secondary | ICD-10-CM

## 2020-05-08 DIAGNOSIS — Z952 Presence of prosthetic heart valve: Secondary | ICD-10-CM | POA: Diagnosis not present

## 2020-05-08 DIAGNOSIS — E041 Nontoxic single thyroid nodule: Secondary | ICD-10-CM

## 2020-05-08 DIAGNOSIS — R9389 Abnormal findings on diagnostic imaging of other specified body structures: Secondary | ICD-10-CM

## 2020-05-08 LAB — ECHOCARDIOGRAM COMPLETE
AR max vel: 1.38 cm2
AV Area VTI: 1.39 cm2
AV Area mean vel: 1.22 cm2
AV Mean grad: 7 mmHg
AV Peak grad: 14.9 mmHg
Ao pk vel: 1.93 m/s
Area-P 1/2: 3.99 cm2
S' Lateral: 1.7 cm

## 2020-05-08 MED ORDER — PERFLUTREN LIPID MICROSPHERE
1.0000 mL | INTRAVENOUS | Status: AC | PRN
  Administered 2020-05-08: 2 mL via INTRAVENOUS

## 2020-05-08 NOTE — Patient Instructions (Signed)
Medication Instructions:  Your physician recommends that you continue on your current medications as directed. Please refer to the Current Medication list given to you today.  *If you need a refill on your cardiac medications before your next appointment, please call your pharmacy*   Lab Work: None ordered   Testing/Procedures: None ordered   Follow-Up: At [facility], you and your health needs are our priority.  As part of our continuing mission to provide you with exceptional heart care, we have created designated Provider Care Teams.  These Care Teams include your primary Cardiologist (physician) and Advanced Practice Providers (APPs -  Physician Assistants and Nurse Practitioners) who all work together to provide you with the care you need, when you need it.  We recommend signing up for the patient portal called "MyChart".  Sign up information is provided on this After Visit Summary.  MyChart is used to connect with patients for Virtual Visits (Telemedicine).  Patients are able to view lab/test results, encounter notes, upcoming appointments, etc.  Non-urgent messages can be sent to your provider as well.   To learn more about what you can do with MyChart, go to [email or web address].    Your next appointment:    June/July  The format for your next appointment:   In Person  Provider:   Rozann Lesches, MD    Thank you for choosing [facility]!!

## 2020-06-09 NOTE — Progress Notes (Signed)
Cardiology Office Note  Date: 06/10/2020   ID: Andrew, Watson 1940-10-14, MRN [id number]  PCP:  Antony Contras, MD  Cardiologist:  Rozann Lesches, MD Electrophysiologist:  None   Chief Complaint  Patient presents with  . Cardiac follow-up    History of Present Illness: Andrew Watson is an 80 y.o. male seen recently in April in the multidisciplinary heart valve clinic, I reviewed the note.  He presents for a routine visit.  States that he has been doing very well.  Remains functional with ADLs including yard work.  He is getting ready to go to the beach for a week.  Recent follow-up echocardiogram in April revealed LVEF 60 to 65% with mild diastolic dysfunction, normal RV contraction, stable TAVR prosthesis with mean gradient 7 mmHg and no paravalvular regurgitation.  I reviewed his medications which he has been tolerating well.  I personally reviewed his ECG today which shows normal sinus rhythm.  Past Medical History:  Diagnosis Date  . Aortic valve stenosis   . Arthritis   . CAD (coronary artery disease) 05/07/2011   Mild atherosclerosis by cardiac catheterization February 2021  . GERD (gastroesophageal reflux disease)   . History of atrial fibrillation 2013   Reportedly limited episode  . History of kidney stones   . History of MRSA infection   . History of pancreatitis 2017   [facility]  . History of pulmonary embolism 2017  . History of TIA (transient ischemic attack)    November 2020  . Hypothyroidism   . S/P TAVR (transcatheter aortic valve replacement) 05/02/2019   s/p TAVR with a 26 mm Edwards Sapien 3 Ultra via the TF approach with Dr. Buena Irish & Dr. Cyndia Bent     Past Surgical History:  Procedure Laterality Date  . BACK SURGERY    . CARDIAC CATHETERIZATION    . CHOLECYSTECTOMY    . COLONOSCOPY  07/08/2011   Procedure: COLONOSCOPY;  Surgeon: Rogene Houston, MD;  [facility];  Service: Endoscopy;  Laterality: N/A;  930  .  CYSTOSCOPY WITH RETROGRADE PYELOGRAM, URETEROSCOPY AND STENT PLACEMENT Left 03/30/2019   Procedure: CYSTOSCOPY WITH LEFT  RETROGRADE , URETEROSCOPY HOLMIUM LASER AND STENT PLACEMENT;  Surgeon: Irine Seal, MD;  [facility]: [facility];  Service: Urology;  Laterality: Left;  . DUODENOTOMY  09/29/2016   repair of fistula, jejunal resection  . INGUINAL HERNIA REPAIR N/A 03/01/2018   Procedure: LAPAROSCOPIC TEP BILATERAL INGUINAL HERNIA REPAIR;  Surgeon: Johnathan Hausen, MD;  [facility];  Service: General;  Laterality: N/A;  . INTRAOPERATIVE TRANSTHORACIC ECHOCARDIOGRAM N/A 05/02/2019   Procedure: Intraoperative Transthoracic Echocardiogram;  Surgeon: Burnell Blanks, MD;  Location: [facility];  Service: Open Heart Surgery;  Laterality: N/A;  . JEJUNOSTOMY FEEDING TUBE    . KNEE ARTHROSCOPY Left   . LEFT HEART CATHETERIZATION WITH CORONARY ANGIOGRAM N/A 05/07/2011   Procedure: LEFT HEART CATHETERIZATION WITH CORONARY ANGIOGRAM;  Surgeon: Peter M Martinique, MD;  Location: [facility] CATH LAB;  Service: Cardiovascular;  Laterality: N/A;  . PEG TUBE PLACEMENT    . RIGHT/LEFT HEART CATH AND CORONARY ANGIOGRAPHY N/A 02/28/2019   Procedure: RIGHT/LEFT HEART CATH AND CORONARY ANGIOGRAPHY;  Surgeon: Burnell Blanks, MD;  Location: [facility] CV LAB;  Service: Cardiovascular;  Laterality: N/A;  . TRANSCATHETER AORTIC VALVE REPLACEMENT, TRANSFEMORAL  05/02/2019  . TRANSCATHETER AORTIC VALVE REPLACEMENT, TRANSFEMORAL N/A 05/02/2019   Procedure: TRANSCATHETER AORTIC VALVE REPLACEMENT, TRANSFEMORAL;  Surgeon: Burnell Blanks, MD;  Location: [facility];  Service:  Open Heart Surgery;  Laterality: N/A;  . VASECTOMY      Current Outpatient Medications  Medication Sig Dispense Refill  . amoxicillin (AMOXIL) 500 MG capsule Take 500 mg by mouth as directed. Before dental appt    . aspirin 81 MG chewable tablet Chew 1 tablet (81 mg total) by mouth daily. 30 tablet 0  . atorvastatin (LIPITOR) 10 MG tablet Take 1 tablet (10  mg total) by mouth daily. 90 tablet 2  . chlorhexidine (PERIDEX) 0.12 % solution as needed.    [name] Kitchen levothyroxine (SYNTHROID, LEVOTHROID) 112 MCG tablet Take 224 mcg by mouth daily before breakfast.      No current facility-administered medications for this visit.   Allergies:  Patient has no known allergies.   ROS: No palpitations or syncope.  Physical Exam: VS:  BP 114/68   Pulse 89   Ht 5\' 10"  (1.778 m)   Wt 218 lb 3.2 oz (99 kg)   SpO2 94%   BMI 31.31 kg/m , BMI Body mass index is 31.31 kg/m.  Wt Readings from Last 3 Encounters:  06/10/20 218 lb 3.2 oz (99 kg)  05/08/20 220 lb (99.8 kg)  01/11/20 223 lb (101.2 kg)    General: Patient appears comfortable at rest. HEENT: Conjunctiva and lids normal, wearing a mask. Neck: Supple, no elevated JVP or carotid bruits, no thyromegaly. Lungs: Clear to auscultation, nonlabored breathing at rest. Cardiac: Regular rate and rhythm, no S3, 2/6 systolic murmur, no pericardial rub. Extremities: No pitting edema.  ECG:  An ECG dated 05/10/2019 was personally reviewed today and demonstrated:  Sinus rhythm with prolonged PR interval.  Recent Labwork:  No recent lab work for review today.  Other Studies Reviewed Today:  Echocardiogram 05/08/2020: 1. Left ventricular ejection fraction, by estimation, is 60 to 65%. The  left ventricle has normal function. The left ventricle has no regional  wall motion abnormalities. There is mild left ventricular hypertrophy.  Left ventricular diastolic parameters  are consistent with Grade I diastolic dysfunction (impaired relaxation).  2. Right ventricular systolic function is normal. The right ventricular  size is normal. Peak RV-RA gradient 18 mmHg.  3. The mitral valve is normal in structure. No evidence of mitral valve  regurgitation. No evidence of mitral stenosis.  4. Bioprosthetic aortic valve s/p TAVR with 26 mm Edwards Sapien THV. No  peri-valvular leakage noted. Mean gradient 7 mmHg.   5. the IVC was not visualized.  6. Technically difficult study with poor acoustic windows.  Assessment and Plan:  1.  Severe aortic stenosis status post TAVR with 26 mm Edwards SAPIEN prosthesis in April 2021.  Recent follow-up echocardiogram shows normal valve function with mean gradient 7 mmHg and no paravalvular regurgitation.  Continue aspirin.  2.  Mild coronary atherosclerosis, asymptomatic.  He is on aspirin and statin therapy.  ECG is normal today.  Medication Adjustments/Labs and Tests Ordered: Current medicines are reviewed at length with the patient today.  Concerns regarding medicines are outlined above.   Tests Ordered: Orders Placed This Encounter  Procedures  . EKG 12-Lead    Medication Changes: No orders of the defined types were placed in this encounter.   Disposition:  Follow up 6 months.  Signed, Satira Sark, MD, [facility] 06/10/2020 1:59 PM    [facility] [place], [place], [place] [address] Phone: [phone]; Fax: [phone]

## 2020-06-10 ENCOUNTER — Ambulatory Visit: Payer: Medicare Other | Admitting: Cardiology

## 2020-06-10 ENCOUNTER — Encounter: Payer: Self-pay | Admitting: Cardiology

## 2020-06-10 VITALS — BP 114/68 | HR 89 | Ht 70.0 in | Wt 218.2 lb

## 2020-06-10 DIAGNOSIS — I2510 Atherosclerotic heart disease of native coronary artery without angina pectoris: Secondary | ICD-10-CM

## 2020-06-10 DIAGNOSIS — I4891 Unspecified atrial fibrillation: Secondary | ICD-10-CM | POA: Diagnosis not present

## 2020-06-10 DIAGNOSIS — Z952 Presence of prosthetic heart valve: Secondary | ICD-10-CM

## 2020-06-10 NOTE — Patient Instructions (Addendum)
Medication Instructions:   Your physician recommends that you continue on your current medications as directed. Please refer to the Current Medication list given to you today.  Labwork:  none  Testing/Procedures:  none  Follow-Up:  Your physician recommends that you schedule a follow-up appointment in: 6 months. You will receive a reminder call or letter in the mail in about 4 months reminding you to call and schedule your appointment. If you don't receive this letter, please contact our office.  Any Other Special Instructions Will Be Listed Below (If Applicable).  If you need a refill on your cardiac medications before your next appointment, please call your pharmacy.

## 2020-07-16 ENCOUNTER — Ambulatory Visit (INDEPENDENT_AMBULATORY_CARE_PROVIDER_SITE_OTHER): Payer: Medicare Other | Admitting: Internal Medicine

## 2020-07-18 ENCOUNTER — Ambulatory Visit: Payer: Medicare Other | Admitting: Urology

## 2020-07-19 ENCOUNTER — Ambulatory Visit: Payer: Medicare Other | Admitting: Urology

## 2020-08-23 IMAGING — US US FNA BIOPSY THYROID 1ST LESION
1 series · 13 of 17 positions shown · non-contrast
Comparison: US Thyroid 05/16/19

MEDICATIONS:
5 cc 1% lidocaine

COMPLICATIONS:
None immediate.

INDICATION: Indeterminate thyroid nodule

Right inferior thyroid nodule
1.6 cm
EXAM:
ULTRASOUND GUIDED FINE NEEDLE ASPIRATION OF INDETERMINATE THYROID
NODULE
TECHNIQUE: Informed written consent was obtained from the patient after a
discussion of the risks, benefits and alternatives to treatment.
Questions regarding the procedure were encouraged and answered. A
timeout was performed prior to the initiation of the procedure.

[Series 1: us fna biopsy thyroid 1st lesion · 0.07mm/px · 17 acquisitions, 13 frames shown]
[im 1/17]
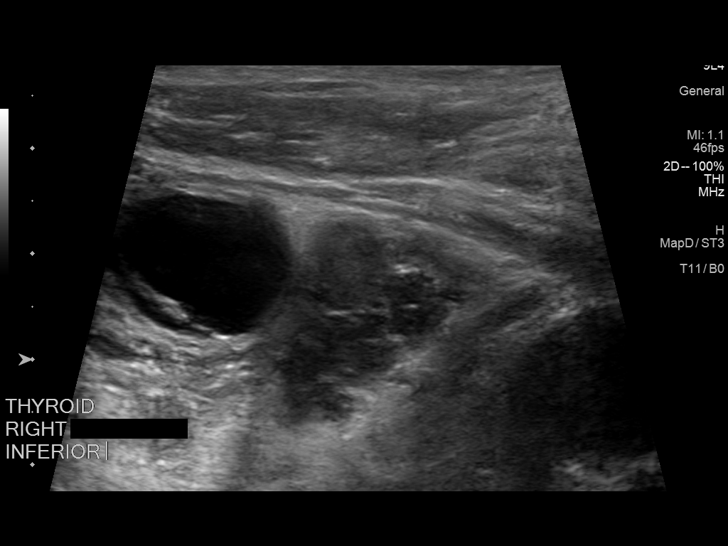
[im 2/17]
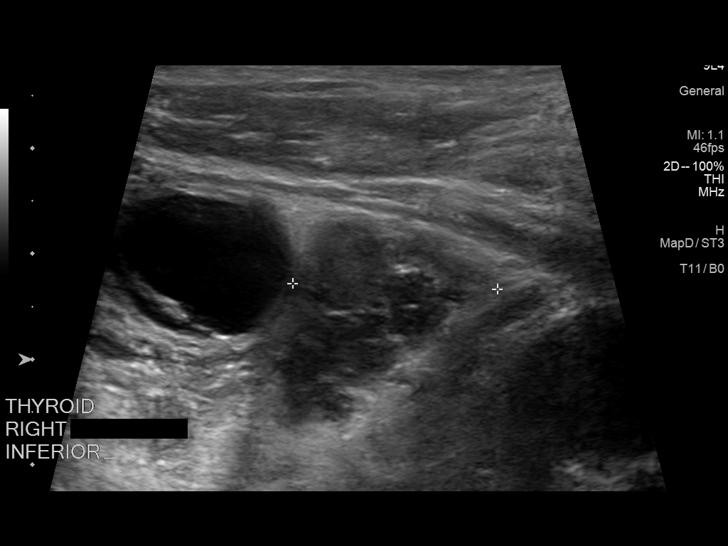
[im 4/17]
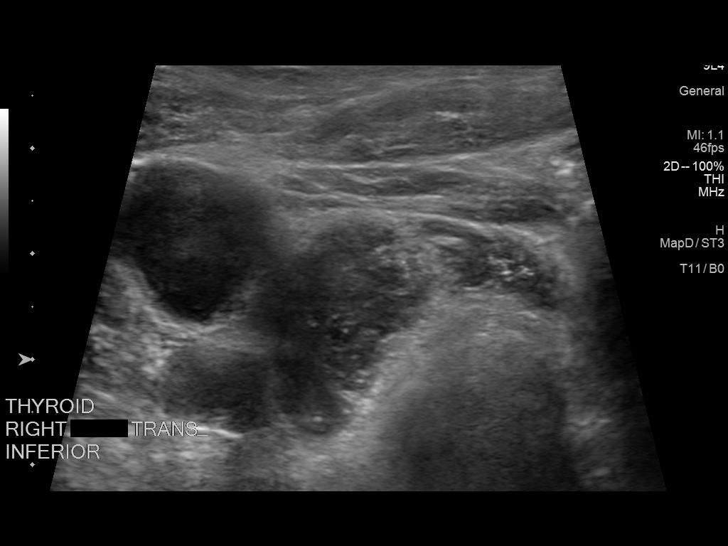
[im 5/17]
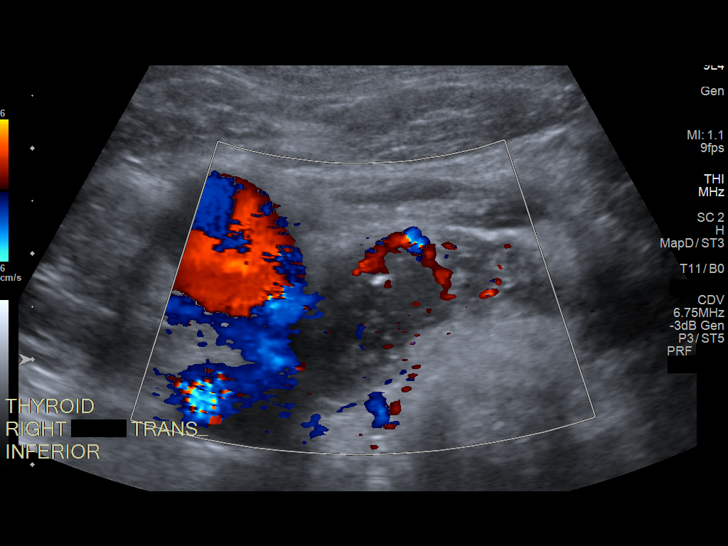
[im 6/17]
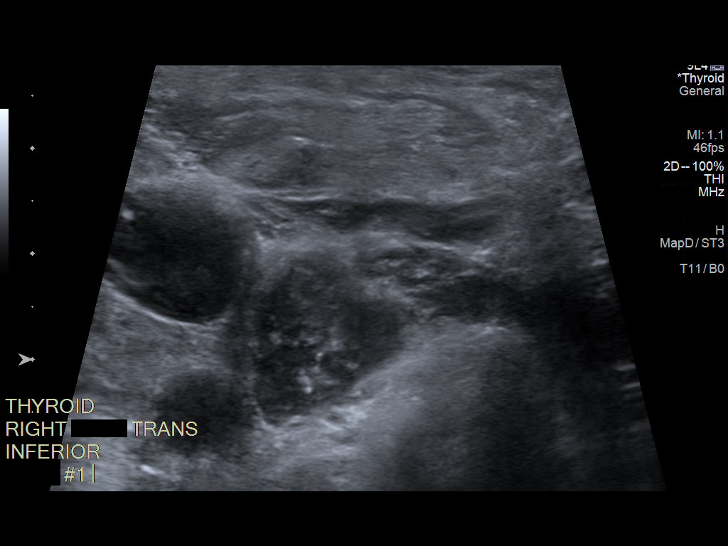
[im 8/17]
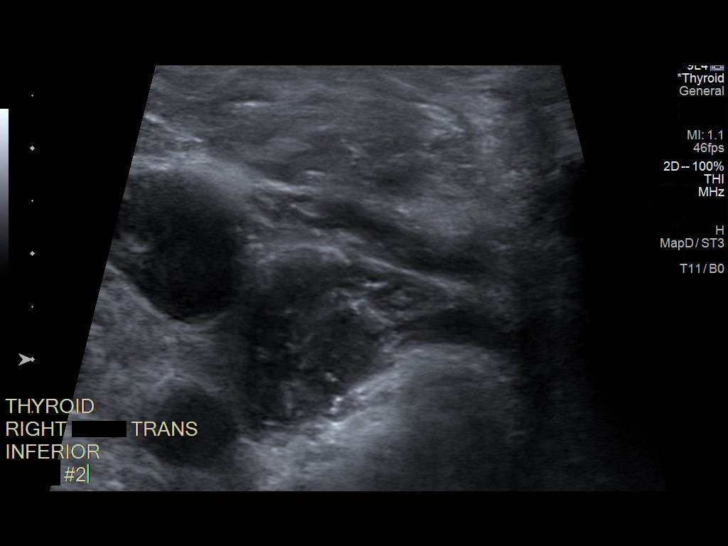
[im 9/17]
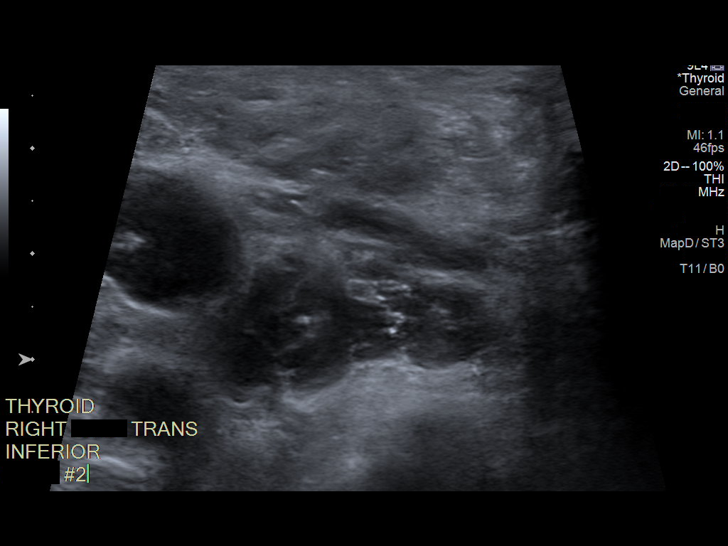
[im 10/17]
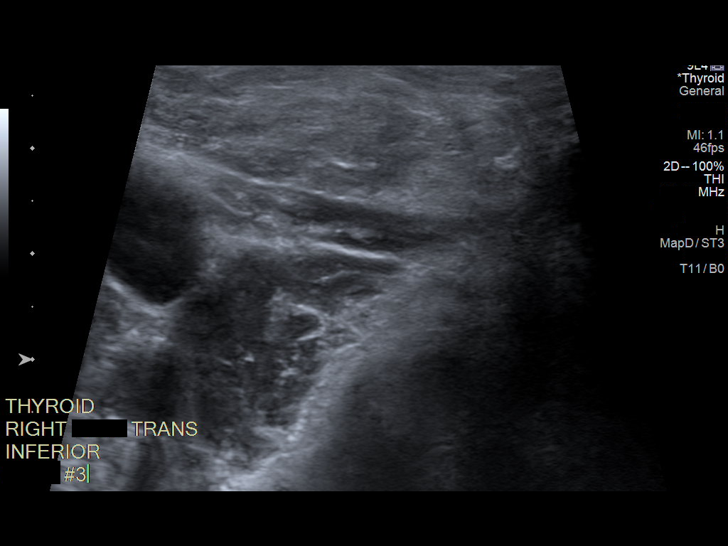
[im 12/17]
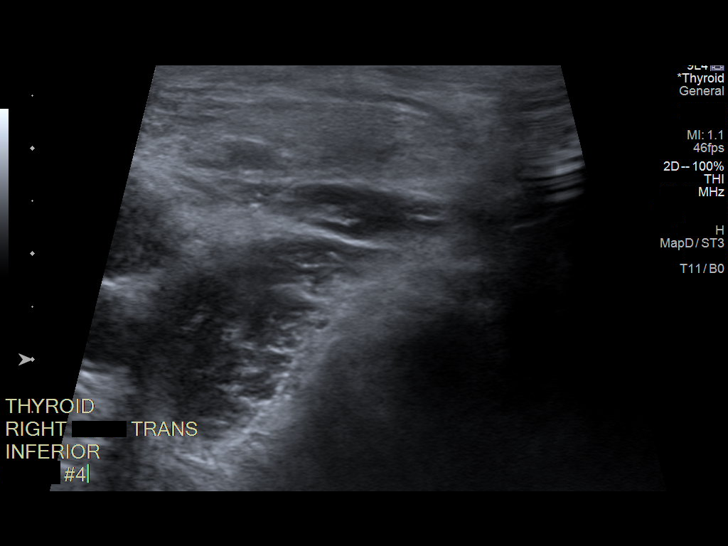
[im 13/17]
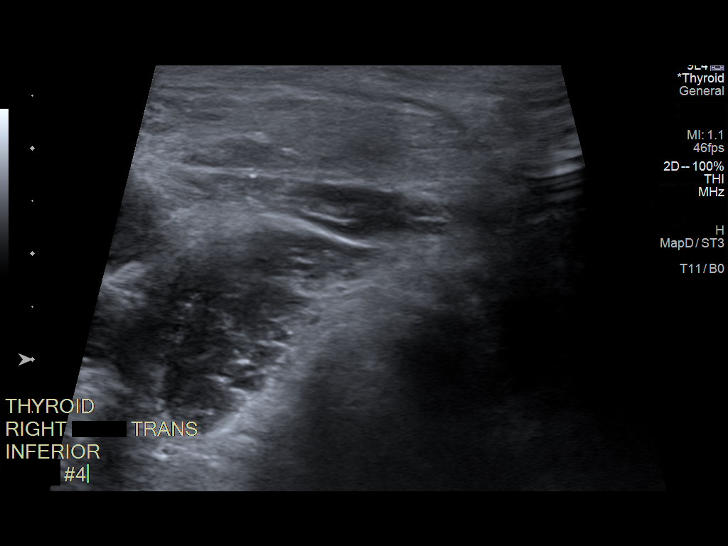
[im 14/17]
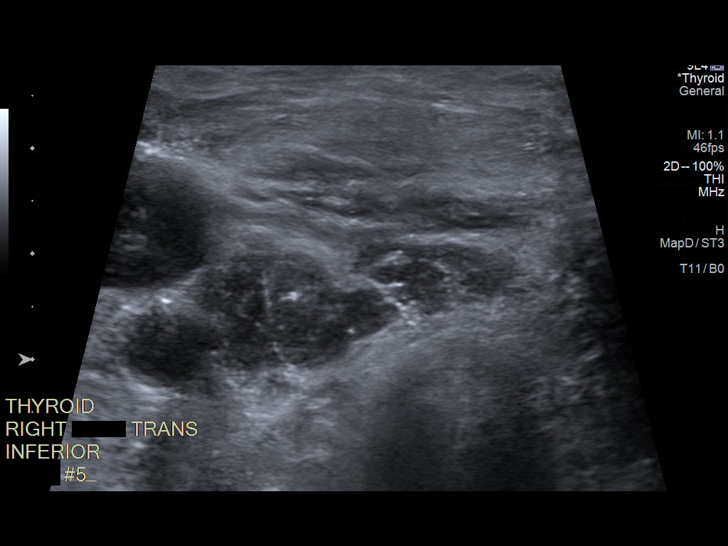
[im 16/17]
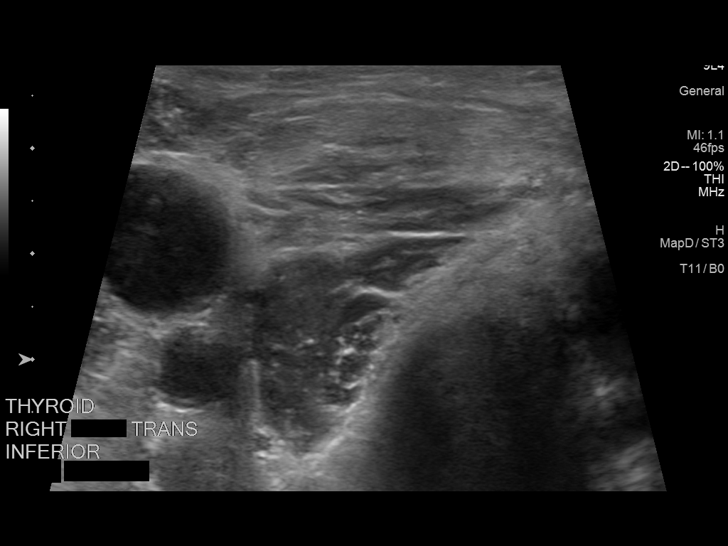
[im 17/17]
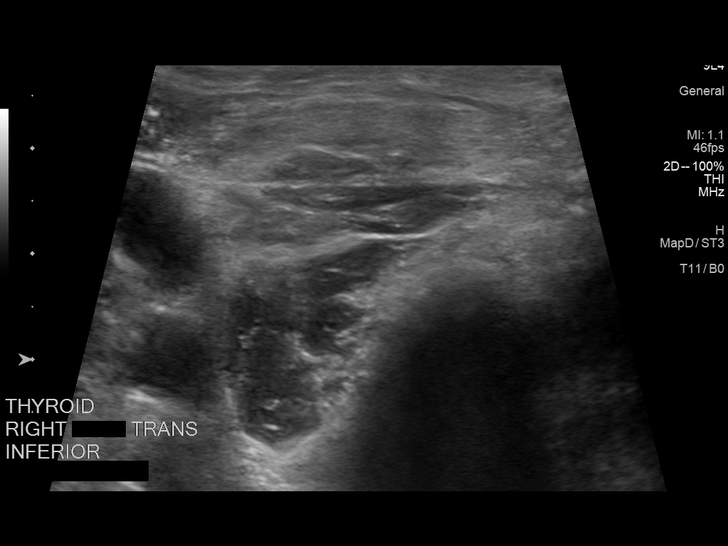

[13 of 17 positions shown; findings below may reference images not displayed]

Pre-procedural ultrasound scanning demonstrated unchanged size and
appearance of the indeterminate nodule within the right thyroid

The procedure was planned. The neck was prepped in the usual sterile
fashion, and a sterile drape was applied covering the operative
field. A timeout was performed prior to the initiation of the
procedure. Local anesthesia was provided with 1% lidocaine.

Under direct ultrasound guidance, 5 FNA biopsies were performed of
the right inferior thyroid nodule with a 25 gauge needle.

2 of these samples were obtained for AFIRMA per ordering GEAROID

Multiple ultrasound images were saved for procedural documentation
purposes. The samples were prepared and submitted to pathology.

Limited post procedural scanning was negative for hematoma or
additional complication. Dressings were placed. The patient
tolerated the above procedures procedure well without immediate
postprocedural complication.
FINDINGS: Nodule reference number based on prior diagnostic ultrasound: 2

Maximum size: 1.6 cm

Location: Right; Inferior

ACR TI-RADS risk category: TR4 (4-6 points)

Reason for biopsy: meets ACR TI-RADS criteria

Ultrasound imaging confirms appropriate placement of the needles
within the thyroid nodule.
IMPRESSION: Technically successful ultrasound guided fine needle aspiration of
right inferior thyroid nodule

Read by

Mileydi Godines

## 2020-09-11 ENCOUNTER — Other Ambulatory Visit: Payer: Self-pay

## 2020-09-11 ENCOUNTER — Ambulatory Visit
Admission: EM | Admit: 2020-09-11 | Discharge: 2020-09-11 | Disposition: A | Discharge status: Home / Self Care | Payer: Medicare Other | Attending: Family Medicine | Admitting: Family Medicine

## 2020-09-11 ENCOUNTER — Encounter: Payer: Self-pay | Admitting: Emergency Medicine

## 2020-09-11 DIAGNOSIS — W540XXA Bitten by dog, initial encounter: Secondary | ICD-10-CM

## 2020-09-11 DIAGNOSIS — L03113 Cellulitis of right upper limb: Secondary | ICD-10-CM

## 2020-09-11 MED ORDER — CEFTRIAXONE SODIUM 1 G IJ SOLR
1.0000 g | Freq: Once | INTRAMUSCULAR | Status: AC
  Administered 2020-09-11: 1 g via INTRAMUSCULAR

## 2020-09-11 MED ORDER — AMOXICILLIN-POT CLAVULANATE 875-125 MG PO TABS: 1.0000 | ORAL_TABLET | Freq: Two times a day (BID) | ORAL | 0 refills | Status: DC

## 2020-09-11 NOTE — ED Triage Notes (Signed)
States his dog bit him on Monday on right hand.  States dog is up to date on vaccines.

## 2020-09-11 NOTE — ED Provider Notes (Signed)
[facility]   GS:[id number] 09/11/20 Arrival Time: [id number]  ASSESSMENT & PLAN:  1. Cellulitis of right upper extremity   2. Dog bite, initial encounter    No sign of abscess formation.  Meds ordered this encounter  Medications   cefTRIAXone (ROCEPHIN) injection 1 g   amoxicillin-clavulanate (AUGMENTIN) 875-125 MG tablet    Sig: Take 1 tablet by mouth every 12 (twelve) hours.    Dispense:  20 tablet    Refill:  0   Reports dog is UTD on vaccines.  Recommend:  Follow-up Information     [facility].   Specialty: Emergency Medicine Why: If worsening or failing to improve as anticipated. Contact information: [address] [id number] [place] [address] [place] [phone]        Antony Contras, MD.   Specialty: Family Medicine Why: As needed. Contact information: [place] [address] [phone]                Reviewed expectations re: course of current medical issues. Questions answered. Outlined signs and symptoms indicating need for more acute intervention. Patient verbalized understanding. After Visit Summary given.  SUBJECTIVE: History from: patient. Andrew Watson is a 80 y.o. male who reports dog bite to R hand approx 48 h ago. Now swollen and red; painful. No extremity sensation changes or weakness. Afebrile. No tx PTA  OBJECTIVE:  Vitals:   09/11/20 1650  BP: 101/68  Pulse: 84  Resp: 16  Temp: 98 F (36.7 C)  TempSrc: Oral  SpO2: 96%    General appearance: alert; no distress HEENT: [place]; AT Neck: supple with FROM Resp: unlabored respirations Extremities: RUE: warm with well perfused appearance; puncture wound around R prox 4th finger; surrounding swelling/erythema that is hot to touch; FROM of wrist and all fingers; normal cap refill and distal sensation Psychological: alert and cooperative; normal mood and affect   No Known  Allergies  Past Medical History:  Diagnosis Date   Aortic valve stenosis    Arthritis    CAD (coronary artery disease) 05/07/2011   Mild atherosclerosis by cardiac catheterization February 2021   GERD (gastroesophageal reflux disease)    History of atrial fibrillation 2013   Reportedly limited episode   History of kidney stones    History of MRSA infection    History of pancreatitis 2017   [facility]   History of pulmonary embolism 2017   History of TIA (transient ischemic attack)    November 2020   Hypothyroidism    S/P TAVR (transcatheter aortic valve replacement) 05/02/2019   s/p TAVR with a 26 mm Edwards Sapien 3 Ultra via the TF approach with Dr. Buena Irish & Dr. Cyndia Bent    Social History   Socioeconomic History   Marital status: Widowed    Spouse name: [name]   Number of children: Not on file   Years of education: Not on file   Highest education level: High school graduate  Occupational History    Comment: retired   Tobacco Use   Smoking status: Never   Smokeless tobacco: Never  Vaping Use   Vaping Use: Never used  Substance and Sexual Activity   Alcohol use: No    Comment: none since Jun 26, 2015   Drug use: No   Sexual activity: Not on file  Other Topics Concern   Not on file  Social History Narrative   Lives with wife   Caffeine- coffee 2 c daily,  usually decaf   Social Determinants of Health   Financial Resource Strain: Not on file  Food Insecurity: Not on file  Transportation Needs: Not on file  Physical Activity: Not on file  Stress: Not on file  Social Connections: Not on file   Family History  Problem Relation Age of Onset   Arthritis Mother    Other Brother        staph infection   Past Surgical History:  Procedure Laterality Date   BACK SURGERY     CARDIAC CATHETERIZATION     CHOLECYSTECTOMY     COLONOSCOPY  07/08/2011   Procedure: COLONOSCOPY;  Surgeon: Rogene Houston, MD;  [facility];  Service: Endoscopy;   Laterality: N/A;  [place], URETEROSCOPY AND STENT PLACEMENT Left 03/30/2019   Procedure: CYSTOSCOPY WITH LEFT  RETROGRADE , URETEROSCOPY HOLMIUM LASER AND STENT PLACEMENT;  Surgeon: Irine Seal, MD;  [facility];  Service: Urology;  Laterality: Left;   DUODENOTOMY  09/29/2016   repair of fistula, jejunal resection   INGUINAL HERNIA REPAIR N/A 03/01/2018   Procedure: LAPAROSCOPIC TEP BILATERAL INGUINAL HERNIA REPAIR;  Surgeon: Johnathan Hausen, MD;  [facility];  Service: General;  Laterality: N/A;   INTRAOPERATIVE TRANSTHORACIC ECHOCARDIOGRAM N/A 05/02/2019   Procedure: Intraoperative Transthoracic Echocardiogram;  Surgeon: Burnell Blanks, MD;  Location: [facility];  Service: Open Heart Surgery;  Laterality: N/A;   JEJUNOSTOMY FEEDING TUBE     KNEE ARTHROSCOPY Left    LEFT HEART CATHETERIZATION WITH CORONARY ANGIOGRAM N/A 05/07/2011   Procedure: LEFT HEART CATHETERIZATION WITH CORONARY ANGIOGRAM;  Surgeon: Peter M Martinique, MD;  Location: [facility] C[facility] LAB;  Service: Cardiovascular;  Laterality: N/A;   PEG TUBE PLACEMENT     RIGHT/LEFT HEART CATH AND CORONARY ANGIOGRAPHY N/A 02/28/2019   Procedure: RIGHT/LEFT HEART CATH AND CORONARY ANGIOGRAPHY;  Surgeon: Burnell Blanks, MD;  Location: [facility] CV LAB;  Service: Cardiovascular;  Laterality: N/A;   TRANSCATHETER AORTIC VALVE REPLACEMENT, TRANSFEMORAL  05/02/2019   TRANSCATHETER AORTIC VALVE REPLACEMENT, TRANSFEMORAL N/A 05/02/2019   Procedure: TRANSCATHETER AORTIC VALVE REPLACEMENT, TRANSFEMORAL;  Surgeon: Burnell Blanks, MD;  Location: [facility];  Service: Open Heart Surgery;  Laterality: N/A;   Lady Deutscher, MD 09/11/20 1725

## 2020-09-24 ENCOUNTER — Encounter (INDEPENDENT_AMBULATORY_CARE_PROVIDER_SITE_OTHER): Payer: Self-pay | Admitting: Internal Medicine

## 2020-09-24 ENCOUNTER — Ambulatory Visit (INDEPENDENT_AMBULATORY_CARE_PROVIDER_SITE_OTHER): Payer: Medicare Other | Admitting: Internal Medicine

## 2020-09-24 ENCOUNTER — Other Ambulatory Visit: Payer: Self-pay

## 2020-09-24 VITALS — BP 112/71 | HR 82 | Temp 98.2°F | Ht 70.0 in | Wt 217.0 lb

## 2020-09-24 DIAGNOSIS — R5383 Other fatigue: Secondary | ICD-10-CM | POA: Insufficient documentation

## 2020-09-24 DIAGNOSIS — K219 Gastro-esophageal reflux disease without esophagitis: Secondary | ICD-10-CM | POA: Diagnosis not present

## 2020-09-24 DIAGNOSIS — Z8719 Personal history of other diseases of the digestive system: Secondary | ICD-10-CM | POA: Diagnosis not present

## 2020-09-24 MED ORDER — FAMOTIDINE 40 MG PO TABS: 40.0000 mg | ORAL_TABLET | Freq: Every day | ORAL | 1 refills | Status: DC

## 2020-09-24 NOTE — Patient Instructions (Signed)
Physician will call with results of blood test. Take famotidine 40 mg every day.  You can take it at bedtime for the formulae meal. You can take Tums or Rolaids for breakthrough heartburn but keep a track as to how many times in the next few months anxiety office visit

## 2020-09-24 NOTE — Progress Notes (Signed)
Presenting complaint;  Heartburn not controlled with medication.  Database and subjective:  Patient is 80 year old Caucasian male who is here for scheduled visit through courtesy of patient's primary care physician Dr. Antony Contras MD. Patient is well-known to me.  His last office visit was on 02/11/2016 for heartburn nausea and vomiting.  His past history is significant for fulminant pancreatitis in June 2017 for which she was initially treated at [facility] and subsequently transferred to [facility] under care of Dr. Gayleen Orem and required multiple interventions for biliary and duodenal strictures.  Patient now presents with heartburn which is not controlled with Pepcid/famotidine anymore.  He has been on famotidine since December 2021.  He generally takes 1 pill a day.  He also has been using OTC NSAIDs.  He denies nausea vomiting dysphagia or throat symptoms.  He drinks 3 cups of coffee daily.  He has not watching his diet.  He does eat fried food and drinks orange juice occasionally.  His appetite is good.  His weight has remained stable.  He weighed 218 pounds on 02/11/2016 and today he weighs 217 pounds.  He denies abdominal pain.  Bowels move daily.  He denies melena or rectal bleeding. His other complaint is 1 of getting tired quickly.  While he does not do any scheduled physical activity he does mow grass.  His tiredness is not associated with chest pain or shortness of breath. He also wants to know if he should have another colonoscopy. He does not drink alcohol anymore.  He does not smoke cigarettes. He retired from [employer] and [employer]. He is widowed.  He has a son and her daughter in good health. Both parents lived to be in their late 35s.  He had 1 brother who died at age 74 of staph infection.   Current Medications: Outpatient Encounter Medications as of 09/24/2020  Medication Sig   aspirin 81 MG chewable tablet Chew 1 tablet (81 mg total) by mouth daily.    clopidogrel (PLAVIX) 75 MG tablet Take 75 mg by mouth daily.   famotidine (PEPCID) 20 MG tablet Take 20 mg by mouth at bedtime.   levothyroxine (SYNTHROID, LEVOTHROID) 112 MCG tablet Take 224 mcg by mouth daily before breakfast.    atorvastatin (LIPITOR) 10 MG tablet Take 1 tablet (10 mg total) by mouth daily. (Patient not taking: Reported on 09/24/2020)   chlorhexidine (PERIDEX) 0.12 % solution as needed. (Patient not taking: Reported on 09/24/2020)   [DISCONTINUED] amoxicillin-clavulanate (AUGMENTIN) 875-125 MG tablet Take 1 tablet by mouth every 12 (twelve) hours.   No facility-administered encounter medications on file as of 09/24/2020.   Past Medical History:  Diagnosis Date   Aortic valve stenosis; status post TAVR as below    Arthritis    CAD (coronary artery disease) 05/07/2011   Mild atherosclerosis by cardiac catheterization February 2021   GERD (gastroesophageal reflux disease)    History of atrial fibrillation 2013   Reportedly limited episode   History of kidney stones    History of MRSA infection    History of fulminant pancreatitis with multiple complications 0034   [facility]   History of pulmonary embolism 2017   History of TIA (transient ischemic attack)    November 2020   Hypothyroidism    S/P TAVR (transcatheter aortic valve replacement) 05/02/2019   s/p TAVR with a 26 mm Edwards Sapien 3 Ultra via the TF approach with Dr. Buena Irish & Dr. Cyndia Bent    Past Surgical History:  Procedure  Laterality Date   BACK SURGERY     CARDIAC CATHETERIZATION     CHOLECYSTECTOMY     COLONOSCOPY  07/08/2011   Procedure: COLONOSCOPY;  Surgeon: Rogene Houston, MD;  [facility];  Service: Endoscopy;  Laterality: N/A;  [place], URETEROSCOPY AND STENT PLACEMENT Left 03/30/2019   Procedure: CYSTOSCOPY WITH LEFT  RETROGRADE , URETEROSCOPY HOLMIUM LASER AND STENT PLACEMENT;  Surgeon: Irine Seal, MD;  Location: [facility];  Service: Urology;   Laterality: Left;   DUODENOTOMY  09/29/2016   repair of fistula, jejunal resection   INGUINAL HERNIA REPAIR N/A 03/01/2018   Procedure: LAPAROSCOPIC TEP BILATERAL INGUINAL HERNIA REPAIR;  Surgeon: Johnathan Hausen, MD;  [facility];  Service: General;  Laterality: N/A;   INTRAOPERATIVE TRANSTHORACIC ECHOCARDIOGRAM N/A 05/02/2019   Procedure: Intraoperative Transthoracic Echocardiogram;  Surgeon: Burnell Blanks, MD;  Location: [place];  Service: Open Heart Surgery;  Laterality: N/A;   JEJUNOSTOMY FEEDING TUBE     KNEE ARTHROSCOPY Left    LEFT HEART CATHETERIZATION WITH CORONARY ANGIOGRAM N/A 05/07/2011   Procedure: LEFT HEART CATHETERIZATION WITH CORONARY ANGIOGRAM;  Surgeon: Peter M Martinique, MD;  Location: [facility] CATH LAB;  Service: Cardiovascular;  Laterality: N/A;   PEG TUBE PLACEMENT     RIGHT/LEFT HEART CATH AND CORONARY ANGIOGRAPHY N/A 02/28/2019   Procedure: RIGHT/LEFT HEART CATH AND CORONARY ANGIOGRAPHY;  Surgeon: Burnell Blanks, MD;  Location: [place] CV LAB;  Service: Cardiovascular;  Laterality: N/A;   TRANSCATHETER AORTIC VALVE REPLACEMENT, TRANSFEMORAL  05/02/2019   TRANSCATHETER AORTIC VALVE REPLACEMENT, TRANSFEMORAL N/A 05/02/2019   Procedure: TRANSCATHETER AORTIC VALVE REPLACEMENT, TRANSFEMORAL;  Surgeon: Burnell Blanks, MD;  Location: [place];  Service: Open Heart Surgery;  Laterality: N/A;   VASECTOMY     Laparoscopic cholecystectomy with duodenotomy to remove migrated stent gastrojejunostomy performed at [facility] on 09/29/2016   Objective: Blood pressure 112/71, pulse 82, temperature 98.2 F (36.8 C), temperature source Oral, height 5' 10"  (1.778 m), weight 217 lb (98.4 kg). Patient is alert and in no acute distress. Conjunctiva is pink. Sclera is nonicteric Oropharyngeal mucosa is normal. No neck masses or thyromegaly noted. Cardiac exam with regular rhythm normal S1 and S2.  He has faint systolic murmur at aortic area. Lungs are clear to  auscultation. Abdomen is full but symmetrical.  On palpation abdomen is soft and nontender with organomegaly or masses. No LE edema or clubbing noted. He has varicose veins in both lower extremities more pronounced in left leg. He also has changes of DJD involving proximal and distal interphalangeal joints in both hands.  Labs/studies Results:   CBC Latest Ref Rng & Units 05/03/2019 05/02/2019 05/02/2019  WBC 4.0 - 10.5 K/uL 5.1 - -  Hemoglobin 13.0 - 17.0 g/dL 10.7(L) 10.5(L) 10.5(L)  Hematocrit 39.0 - 52.0 % 33.5(L) 31.0(L) 31.0(L)  Platelets 150 - 400 K/uL 110(L) - -    CMP Latest Ref Rng & Units 05/03/2019 05/02/2019 05/02/2019  Glucose 70 - 99 mg/dL 104(H) 103(H) 110(H)  BUN 8 - 23 mg/dL 17 18 18   Creatinine 0.61 - 1.24 mg/dL 1.01 0.90 0.90  Sodium 135 - 145 mmol/L 138 141 141  Potassium 3.5 - 5.1 mmol/L 4.4 4.5 4.5  Chloride 98 - 111 mmol/L 107 106 106  CO2 22 - 32 mmol/L 24 - -  Calcium 8.9 - 10.3 mg/dL 8.6(L) - -  Total Protein 6.5 - 8.1 g/dL - - -  Total Bilirubin 0.3 - 1.2 mg/dL - - -  Alkaline Phos 38 - 126 U/L - - -  AST 15 - 41 U/L - - -  ALT 0 - 44 U/L - - -    Hepatic Function Latest Ref Rng & Units 04/28/2019 12/05/2018 04/19/2016  Total Protein 6.5 - 8.1 g/dL 6.7 6.7 6.5  Albumin 3.5 - 5.0 g/dL 3.7 3.8 2.9(L)  AST 15 - 41 U/L 21 23 18   ALT 0 - 44 U/L 22 21 14(L)  Alk Phosphatase 38 - 126 U/L 42 41 46  Total Bilirubin 0.3 - 1.2 mg/dL 1.1 1.3(H) 1.5(H)  Bilirubin, Direct 0.1 - 0.5 mg/dL - - -    Lab data from 09/12/2019 Glucose 96 BUN 19 creatinine 1.12 Serum sodium 140, potassium 4.3, chloride 105, CO2 30 Serum calcium 9.4 Bilirubin 1.1, AP 47, AST 19, ALT 17, total protein 7.0  TSH 0.04. Serum magnesium 1.9.   Assessment:  #1.  GERD.  Patient has typical symptoms.  He does not have any alarm symptoms.  He is on low-dose famotidine which worked until few weeks ago.  He underwent a multiple endoscopic evaluations most recent of which was in April 2018 revealing  normal esophageal mucosa.  Therefore he does not have Barrett's esophagus.  He has had gastrojejunostomy and therefore could be having bile reflux. Since he does not have alarm symptoms it would be reasonable to treat him with higher dose of famotidine before switching to PPI or considering esophagogastroduodenoscopy.  #2.  Tiredness.  Hemoglobin last year was low.  No recent hemoglobin on file.  Need to make sure that anemia is not the source of his symptoms. He also has hypothyroidism and his last TSH 1 year ago was at lower limit of normal.  #3.  Patient is average risk for colorectal carcinoma.  His last colonoscopy was in June 2013 with removal of small polyp which was not an adenoma.  Therefore no indication for colonoscopy.  Plan:  Antireflux measures reinforced. Increase famotidine to 40 mg by mouth daily at bedtime or after evening meal. Patient will go to the lab for CBC with differential, comprehensive chemistry panel and TSH level. Office visit in 3 months.

## 2020-09-25 LAB — COMPREHENSIVE METABOLIC PANEL
AG Ratio: 1.8 (calc) (ref 1.0–2.5)
ALT: 17 U/L (ref 9–46)
AST: 18 U/L (ref 10–35)
Albumin: 4.2 g/dL (ref 3.6–5.1)
Alkaline phosphatase (APISO): 47 U/L (ref 35–144)
BUN: 19 mg/dL (ref 7–25)
CO2: 28 mmol/L (ref 20–32)
Calcium: 9.7 mg/dL (ref 8.6–10.3)
Chloride: 106 mmol/L (ref 98–110)
Creat: 1.03 mg/dL (ref 0.70–1.22)
Globulin: 2.4 g/dL (calc) (ref 1.9–3.7)
Glucose, Bld: 107 mg/dL (ref 65–139)
Potassium: 4.4 mmol/L (ref 3.5–5.3)
Sodium: 140 mmol/L (ref 135–146)
Total Bilirubin: 0.7 mg/dL (ref 0.2–1.2)
Total Protein: 6.6 g/dL (ref 6.1–8.1)

## 2020-09-25 LAB — CBC WITH DIFFERENTIAL/PLATELET
Absolute Monocytes: 509 cells/uL (ref 200–950)
Basophils Absolute: 42 cells/uL (ref 0–200)
Basophils Relative: 0.8 %
Eosinophils Absolute: 90 cells/uL (ref 15–500)
Eosinophils Relative: 1.7 %
HCT: 41.5 % (ref 38.5–50.0)
Hemoglobin: 13.9 g/dL (ref 13.2–17.1)
Lymphs Abs: 1145 cells/uL (ref 850–3900)
MCH: 30.1 pg (ref 27.0–33.0)
MCHC: 33.5 g/dL (ref 32.0–36.0)
MCV: 89.8 fL (ref 80.0–100.0)
MPV: 9.9 fL (ref 7.5–12.5)
Monocytes Relative: 9.6 %
Neutro Abs: 3514 cells/uL (ref 1500–7800)
Neutrophils Relative %: 66.3 %
Platelets: 152 10*3/uL (ref 140–400)
RBC: 4.62 10*6/uL (ref 4.20–5.80)
RDW: 12 % (ref 11.0–15.0)
Total Lymphocyte: 21.6 %
WBC: 5.3 10*3/uL (ref 3.8–10.8)

## 2020-09-25 LAB — TSH: TSH: 0.47 mIU/L (ref 0.40–4.50)

## 2020-10-28 DIAGNOSIS — M545 Low back pain, unspecified: Secondary | ICD-10-CM | POA: Diagnosis not present

## 2020-10-28 DIAGNOSIS — J069 Acute upper respiratory infection, unspecified: Secondary | ICD-10-CM | POA: Diagnosis not present

## 2020-11-14 DIAGNOSIS — E039 Hypothyroidism, unspecified: Secondary | ICD-10-CM | POA: Diagnosis not present

## 2020-11-14 DIAGNOSIS — E7800 Pure hypercholesterolemia, unspecified: Secondary | ICD-10-CM | POA: Diagnosis not present

## 2020-11-14 DIAGNOSIS — R252 Cramp and spasm: Secondary | ICD-10-CM | POA: Diagnosis not present

## 2020-11-14 DIAGNOSIS — M545 Low back pain, unspecified: Secondary | ICD-10-CM | POA: Diagnosis not present

## 2020-12-27 DIAGNOSIS — M79675 Pain in left toe(s): Secondary | ICD-10-CM | POA: Diagnosis not present

## 2020-12-27 DIAGNOSIS — E7800 Pure hypercholesterolemia, unspecified: Secondary | ICD-10-CM | POA: Diagnosis not present

## 2021-01-28 ENCOUNTER — Other Ambulatory Visit: Payer: Self-pay

## 2021-01-28 ENCOUNTER — Encounter (INDEPENDENT_AMBULATORY_CARE_PROVIDER_SITE_OTHER): Payer: Self-pay | Admitting: Internal Medicine

## 2021-01-28 ENCOUNTER — Ambulatory Visit (INDEPENDENT_AMBULATORY_CARE_PROVIDER_SITE_OTHER): Payer: Medicare Other | Admitting: Internal Medicine

## 2021-01-28 VITALS — BP 105/70 | HR 76 | Temp 97.9°F | Ht 70.0 in | Wt 216.0 lb

## 2021-01-28 DIAGNOSIS — K219 Gastro-esophageal reflux disease without esophagitis: Secondary | ICD-10-CM | POA: Diagnosis not present

## 2021-01-28 MED ORDER — FAMOTIDINE 40 MG PO TABS: 40.0000 mg | ORAL_TABLET | Freq: Every day | ORAL | 3 refills | Status: DC

## 2021-01-28 NOTE — Progress Notes (Signed)
Presenting complaint;  Follow for chronic GERD.  Database and subjective:  Patient is 81 year old Caucasian male who is here for scheduled visit.  He has complicated history of pancreatitis from which she is fully recovered.  He was seen 4 months ago for heartburn poorly controlled with low-dose famotidine.  He was also complaining of feeling tired and fatigued.  Lab studies included CBC, comprehensive chemistry panel and TSH were all normal. Famotidine dose was increased to 40 mg daily at bedtime and he was advised to watch intake of foods which trigger his symptoms and also eat supper at least 3 hours before going to bed.  He says he feels much better.  Doing time he has heartburn as if he forgets to take famotidine.  He says he does well and on some nights he forgets to take the medication.  He denies dysphagia nausea or vomiting.  His appetite is good.  His weight remains stable.  Bowels move daily.  He may skip a day occasionally.  He denies melena or rectal bleeding. Patient feels his stamina has improved.  He stays busy.  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  Lately he has not been walking because of bad weather.   Current Medications: Outpatient Encounter Medications as of 01/28/2021  Medication Sig   aspirin 81 MG chewable tablet Chew 1 tablet (81 mg total) by mouth daily.   clopidogrel (PLAVIX) 75 MG tablet Take 75 mg by mouth daily.   famotidine (PEPCID) 40 MG tablet Take 1 tablet (40 mg total) by mouth at bedtime.   levothyroxine (SYNTHROID) 200 MCG tablet Take 200 mcg by mouth daily before breakfast.   No facility-administered encounter medications on file as of 01/28/2021.     Objective: Blood pressure 105/70, pulse 76, temperature 97.9 F (36.6 C), temperature source Oral, height 5' 10"  (1.778 m), weight 216 lb (98 kg). Patient is alert and in no acute distress Conjunctiva is pink. Sclera is nonicteric Oropharyngeal mucosa is normal. No neck masses or thyromegaly  noted. Cardiac exam with regular rhythm normal S1 and S2.  Faint systolic murmur at aortic area is unchanged. Lungs are clear to auscultation. Abdomen abdomen is full but soft and nontender with organomegaly or masses. No LE edema or clubbing noted.  Labs/studies Results:   CBC Latest Ref Rng & Units 09/24/2020 05/03/2019 05/02/2019  WBC 3.8 - 10.8 Thousand/uL 5.3 5.1 -  Hemoglobin 13.2 - 17.1 g/dL 13.9 10.7(L) 10.5(L)  Hematocrit 38.5 - 50.0 % 41.5 33.5(L) 31.0(L)  Platelets 140 - 400 Thousand/uL 152 110(L) -    CMP Latest Ref Rng & Units 09/24/2020 05/03/2019 05/02/2019  Glucose 65 - 139 mg/dL 107 104(H) 103(H)  BUN 7 - 25 mg/dL 19 17 18   Creatinine 0.70 - 1.22 mg/dL 1.03 1.01 0.90  Sodium 135 - 146 mmol/L 140 138 141  Potassium 3.5 - 5.3 mmol/L 4.4 4.4 4.5  Chloride 98 - 110 mmol/L 106 107 106  CO2 20 - 32 mmol/L 28 24 -  Calcium 8.6 - 10.3 mg/dL 9.7 8.6(L) -  Total Protein 6.1 - 8.1 g/dL 6.6 - -  Total Bilirubin 0.2 - 1.2 mg/dL 0.7 - -  Alkaline Phos 38 - 126 U/L - - -  AST 10 - 35 U/L 18 - -  ALT 9 - 46 U/L 17 - -    Hepatic Function Latest Ref Rng & Units 09/24/2020 04/28/2019 12/05/2018  Total Protein 6.1 - 8.1 g/dL 6.6 6.7 6.7  Albumin 3.5 - 5.0 g/dL - 3.7  3.8  AST 10 - 35 U/L 18 21 23   ALT 9 - 46 U/L 17 22 21   Alk Phosphatase 38 - 126 U/L - 42 41  Total Bilirubin 0.2 - 1.2 mg/dL 0.7 1.1 1.3(H)  Bilirubin, Direct 0.1 - 0.5 mg/dL - - -     Assessment:  #1.  Chronic GERD.  Patient is doing well with dietary measures and famotidine at a dose of 40 mg every night.  He did not do well with 20 mg.  We will continue this therapy as long it is working.  Plan:  New prescription for famotidine 40 mg p.o. nightly sent to patient's pharmacy for 90 doses with 3 refills. Patient will call office if famotidine stops working or he has dysphagia. Office visit in 1 year.

## 2021-01-28 NOTE — Patient Instructions (Signed)
Please call office of famotidine stops working or if you have swallowing difficulty.

## 2021-02-12 ENCOUNTER — Other Ambulatory Visit: Payer: Self-pay | Admitting: Cardiology

## 2021-02-12 NOTE — Telephone Encounter (Signed)
Left a message for pt to call our office for medication refill.

## 2021-02-17 ENCOUNTER — Telehealth: Payer: Self-pay | Admitting: Cardiology

## 2021-02-17 MED ORDER — CLOPIDOGREL BISULFATE 75 MG PO TABS: 75.0000 mg | ORAL_TABLET | Freq: Every day | ORAL | 3 refills | Status: DC

## 2021-02-17 NOTE — Telephone Encounter (Signed)
*  STAT* If patient is at the pharmacy, call can be transferred to refill team.   1. Which medications need to be refilled? (please list name of each medication and dose if known)  clopidogrel (PLAVIX) 75 MG tablet  2. Which pharmacy/location (including street and city if local pharmacy) is medication to be sent to? [place]  3. Do they need a 30 day or 90 day supply?  90 day supply. Patient is completely out of medication.

## 2021-02-17 NOTE — Telephone Encounter (Signed)
Completed.

## 2021-03-12 ENCOUNTER — Ambulatory Visit: Payer: Medicare Other | Admitting: Podiatry

## 2021-03-12 ENCOUNTER — Other Ambulatory Visit: Payer: Self-pay

## 2021-03-12 ENCOUNTER — Encounter: Payer: Self-pay | Admitting: Podiatry

## 2021-03-12 ENCOUNTER — Ambulatory Visit (INDEPENDENT_AMBULATORY_CARE_PROVIDER_SITE_OTHER): Payer: Medicare Other

## 2021-03-12 DIAGNOSIS — I7300 Raynaud's syndrome without gangrene: Secondary | ICD-10-CM | POA: Diagnosis not present

## 2021-03-12 DIAGNOSIS — M7752 Other enthesopathy of left foot: Secondary | ICD-10-CM | POA: Diagnosis not present

## 2021-03-12 DIAGNOSIS — M2041 Other hammer toe(s) (acquired), right foot: Secondary | ICD-10-CM

## 2021-03-12 DIAGNOSIS — M2042 Other hammer toe(s) (acquired), left foot: Secondary | ICD-10-CM

## 2021-03-12 NOTE — Progress Notes (Signed)
Subjective:   Patient ID: Andrew Watson, male   DOB: 81 y.o.   MRN: [id number]   HPI Patient presents stating that has bilateral burning tingling in the toes with the left third digit being involved the most in the right third digit being involved secondarily and states its been going on for 5 to 6 months and is sore especially at nighttime.  Patient does not smoke likes to be active   Review of Systems  All other systems reviewed and are negative.      Objective:  Physical Exam Vitals and nursing note reviewed.  Constitutional:      Appearance: He is well-developed.  Pulmonary:     Effort: Pulmonary effort is normal.  Musculoskeletal:        General: Normal range of motion.  Skin:    General: Skin is warm.  Neurological:     Mental Status: He is alert.    Neurovascular status mildly diminished but intact with good digital perfusion and a small area of distal redness on the third digit left over the right third digit.  It is localized to this area and is sore with patient also noted to have digital deformity.  Patient does have good warmth in the feet and hair growth     Assessment:  Possibility for exposure to cold with Raynaud's type phenomena with digital deformities as secondary pathology     Plan:  H&P reviewed both conditions discussed Raynaud's educating him on Raynaud's and the possibility for cold exposure and patient admits he lets the dog out in the morning barefoot.  Also discussed hammertoe deformity considerations for treatment of this but I do not recommend at the current time and I would rather he use cushions which were dispensed today to take pressure off the toes warming therapy and Tylenol at night.  If symptoms persist will be seen back  X-rays indicate that there is moderate depression moderate structural damage of the lesser digits bilateral but no indications of osteolysis or bony pathology

## 2021-03-13 ENCOUNTER — Other Ambulatory Visit: Payer: Self-pay | Admitting: Internal Medicine

## 2021-05-13 NOTE — Progress Notes (Signed)
? ? ?Cardiology Office Note ? ?Date: 05/14/2021  ? ?ID: Andrew Watson, DOB 1940-08-08, MRN [id number] ? ?PCP:  Andrew Contras, MD  ?Cardiologist:  Andrew Lesches, MD ?Electrophysiologist:  None  ? ?Chief Complaint  ?Patient presents with  ? Cardiac follow-up  ? ? ?History of Present Illness: ?Andrew Watson is an 81 y.o. male last seen in May 2022.  He is here for a follow-up visit.  States that he has been doing well over the last year, NYHA class II dyspnea, no exertional chest pain or palpitations. ? ?He is due for a follow-up echocardiogram, history of TAVR with 26 mm Edwards SAPIEN 3 Ultra in April 2021. ? ?I personally reviewed his ECG today which shows sinus rhythm with decreased R wave progression. ? ?Medications are stable and outlined below.  He does not report any spontaneous bleeding problems. ? ?Past Medical History:  ?Diagnosis Date  ? Aortic valve stenosis   ? Arthritis   ? CAD (coronary artery disease) 05/07/2011  ? Mild atherosclerosis by cardiac catheterization February 2021  ? GERD (gastroesophageal reflux disease)   ? History of atrial fibrillation 2013  ? Reportedly limited episode  ? History of kidney stones   ? History of MRSA infection   ? History of pancreatitis 2017  ? [place]  ? History of pulmonary embolism 2017  ? History of TIA (transient ischemic attack)   ? November 2020  ? Hypothyroidism   ? S/P TAVR (transcatheter aortic valve replacement) 05/02/2019  ? s/p TAVR with a 26 mm Edwards Sapien 3 Ultra via the TF approach with Dr. Buena Irish & Dr. Cyndia Bent   ? ? ?Past Surgical History:  ?Procedure Laterality Date  ? BACK SURGERY    ? CARDIAC CATHETERIZATION    ? CHOLECYSTECTOMY    ? COLONOSCOPY  07/08/2011  ? Procedure: COLONOSCOPY;  Surgeon: Andrew Houston, MD;  [facility];  Service: Endoscopy;  Laterality: N/A;  930  ? CYSTOSCOPY WITH RETROGRADE PYELOGRAM, URETEROSCOPY AND STENT PLACEMENT Left 03/30/2019  ? Procedure: CYSTOSCOPY WITH LEFT  RETROGRADE , URETEROSCOPY  HOLMIUM LASER AND STENT PLACEMENT;  Surgeon: Andrew Seal, MD;  [facility];  Service: Urology;  Laterality: Left;  ? DUODENOTOMY  09/29/2016  ? repair of fistula, jejunal resection  ? INGUINAL HERNIA REPAIR N/A 03/01/2018  ? Procedure: LAPAROSCOPIC TEP BILATERAL INGUINAL HERNIA REPAIR;  Surgeon: Andrew Hausen, MD;  [facility];  Service: General;  Laterality: N/A;  ? INTRAOPERATIVE TRANSTHORACIC ECHOCARDIOGRAM N/A 05/02/2019  ? Procedure: Intraoperative Transthoracic Echocardiogram;  Surgeon: Andrew Blanks, MD;  Location: [place];  Service: Open Heart Surgery;  Laterality: N/A;  ? JEJUNOSTOMY FEEDING TUBE    ? KNEE ARTHROSCOPY Left   ? LEFT HEART CATHETERIZATION WITH CORONARY ANGIOGRAM N/A 05/07/2011  ? Procedure: LEFT HEART CATHETERIZATION WITH CORONARY ANGIOGRAM;  Surgeon: Andrew M Martinique, MD;  Location: [facility] CATH LAB;  Service: Cardiovascular;  Laterality: N/A;  ? PEG TUBE PLACEMENT    ? RIGHT/LEFT HEART CATH AND CORONARY ANGIOGRAPHY N/A 02/28/2019  ? Procedure: RIGHT/LEFT HEART CATH AND CORONARY ANGIOGRAPHY;  Surgeon: Andrew Blanks, MD;  Location: [place] CV LAB;  Service: Cardiovascular;  Laterality: N/A;  ? TRANSCATHETER AORTIC VALVE REPLACEMENT, TRANSFEMORAL  05/02/2019  ? TRANSCATHETER AORTIC VALVE REPLACEMENT, TRANSFEMORAL N/A 05/02/2019  ? Procedure: TRANSCATHETER AORTIC VALVE REPLACEMENT, TRANSFEMORAL;  Surgeon: Andrew Blanks, MD;  Location: [place];  Service: Open Heart Surgery;  Laterality: N/A;  ? VASECTOMY    ? ? ?Current Outpatient  Medications  ?Medication Sig Dispense Refill  ? aspirin 81 MG chewable tablet Chew 1 tablet (81 mg total) by mouth daily. 30 tablet 0  ? clopidogrel (PLAVIX) 75 MG tablet Take 1 tablet (75 mg total) by mouth daily. 90 tablet 3  ? famotidine (PEPCID) 40 MG tablet Take 1 tablet (40 mg total) by mouth at bedtime. 90 tablet 3  ? levothyroxine (SYNTHROID) 200 MCG tablet Take 200 mcg by mouth daily before breakfast.    ? ?No current  facility-administered medications for this visit.  ? ?Allergies:  Patient has no known allergies.  ? ?ROS: No palpitations or syncope. ? ?Physical Exam: ?VS:  BP 126/82   Pulse 76   Ht '5\' 10"'$  (1.778 m)   Wt 220 lb 12.8 oz (100.2 kg)   SpO2 98%   BMI 31.68 kg/m? , BMI Body mass index is 31.68 kg/m?. ? ?Wt Readings from Last 3 Encounters:  ?05/14/21 220 lb 12.8 oz (100.2 kg)  ?01/28/21 216 lb (98 kg)  ?09/24/20 217 lb (98.4 kg)  ?  ?General: Patient appears comfortable at rest. ?HEENT: Conjunctiva and lids normal. ?Neck: Supple, no elevated JVP or carotid bruits, no thyromegaly. ?Lungs: Clear to auscultation, nonlabored breathing at rest. ?Cardiac: Regular rate and rhythm, no S3, 2/6 systolic murmur, no pericardial rub. ?Extremities: No pitting edema. ? ?ECG:  An ECG dated 06/10/2020 was personally reviewed today and demonstrated:  Sinus rhythm. ? ?Recent Labwork: ?09/24/2020: ALT 17; AST 18; BUN 19; Creat 1.03; Hemoglobin 13.9; Platelets 152; Potassium 4.4; Sodium 140; TSH 0.47  ?   ?Component Value Date/Time  ? CHOL 116 12/06/2018 1238  ? TRIG 25 12/06/2018 1238  ? HDL 45 12/06/2018 1238  ? CHOLHDL 2.6 12/06/2018 1238  ? VLDL 5 12/06/2018 1238  ? [place] 66 12/06/2018 1238  ? ? ?Other Studies Reviewed Today: ? ?Echocardiogram 05/08/2020: ? 1. Left ventricular ejection fraction, by estimation, is 60 to 65%. The  ?left ventricle has normal function. The left ventricle has no regional  ?wall motion abnormalities. There is mild left ventricular hypertrophy.  ?Left ventricular diastolic parameters  ?are consistent with Grade I diastolic dysfunction (impaired relaxation).  ? 2. Right ventricular systolic function is normal. The right ventricular  ?size is normal. Peak RV-RA gradient 18 mmHg.  ? 3. The mitral valve is normal in structure. No evidence of mitral valve  ?regurgitation. No evidence of mitral stenosis.  ? 4. Bioprosthetic aortic valve s/p TAVR with 26 mm Edwards Sapien THV. No  ?peri-valvular leakage noted.  Mean gradient 7 mmHg.  ? 5. the IVC was not visualized.  ? 6. Technically difficult study with poor acoustic windows. ? ?Assessment and Plan: ? ?1.  History of severe aortic stenosis status post TAVR in April 2021 as outlined above.  He is clinically stable with NYHA class II dyspnea.  ECG reviewed.  We will obtain an updated echocardiogram compared to prior study from April 2022 at which point mean AV gradient was 7 mmHg and there was no paravalvular regurgitation. ? ?2.  History of mild coronary atherosclerosis and previous stroke.  He remains on aspirin and Plavix, tolerating well. ? ?Medication Adjustments/Labs and Tests Ordered: ?Current medicines are reviewed at length with the patient today.  Concerns regarding medicines are outlined above.  ? ?Tests Ordered: ?Orders Placed This Encounter  ?Procedures  ? EKG 12-Lead  ? ECHOCARDIOGRAM COMPLETE  ? ? ?Medication Changes: ?No orders of the defined types were placed in this encounter. ? ? ?Disposition:  Follow up  1 year. ? ?Signed, ?Satira Sark, MD, [facility] ?05/14/2021 8:56 AM    ?[place] [facility] ?[place], [place], [place] [address] ?Phone: [phone]; Fax: [phone]  ?

## 2021-05-14 ENCOUNTER — Encounter: Payer: Self-pay | Admitting: Cardiology

## 2021-05-14 ENCOUNTER — Ambulatory Visit: Payer: Medicare Other | Admitting: Cardiology

## 2021-05-14 VITALS — BP 126/82 | HR 76 | Ht 70.0 in | Wt 220.8 lb

## 2021-05-14 DIAGNOSIS — I2510 Atherosclerotic heart disease of native coronary artery without angina pectoris: Secondary | ICD-10-CM | POA: Diagnosis not present

## 2021-05-14 DIAGNOSIS — I4891 Unspecified atrial fibrillation: Secondary | ICD-10-CM | POA: Diagnosis not present

## 2021-05-14 DIAGNOSIS — Z952 Presence of prosthetic heart valve: Secondary | ICD-10-CM

## 2021-05-14 NOTE — Patient Instructions (Addendum)
Medication Instructions:  Your physician recommends that you continue on your current medications as directed. Please refer to the Current Medication list given to you today.  Labwork: none  Testing/Procedures: Your physician has requested that you have an echocardiogram. Echocardiography is a painless test that uses sound waves to create images of your heart. It provides your doctor with information about the size and shape of your heart and how well your heart's chambers and valves are working. This procedure takes approximately one hour. There are no restrictions for this procedure.  Follow-Up: Your physician recommends that you schedule a follow-up appointment in: 1 year. You will receive a reminder call in the mail in about 10 months reminding you to call and schedule your appointment. If you don't receive this call, please contact our office.  Any Other Special Instructions Will Be Listed Below (If Applicable).  If you need a refill on your cardiac medications before your next appointment, please call your pharmacy. 

## 2021-06-03 ENCOUNTER — Ambulatory Visit (HOSPITAL_COMMUNITY)
Admission: RE | Admit: 2021-06-03 | Discharge: 2021-06-03 | Disposition: A | Discharge status: Home / Self Care | Payer: Medicare Other | Source: Ambulatory Visit | Attending: Cardiology | Admitting: Cardiology

## 2021-06-03 DIAGNOSIS — Z952 Presence of prosthetic heart valve: Secondary | ICD-10-CM | POA: Diagnosis not present

## 2021-06-03 LAB — ECHOCARDIOGRAM COMPLETE
AR max vel: 0.97 cm2
AV Area VTI: 1.06 cm2
AV Area mean vel: 1.07 cm2
AV Mean grad: 12 mmHg
AV Peak grad: 22.7 mmHg
Ao pk vel: 2.38 m/s
Area-P 1/2: 2.22 cm2
S' Lateral: 2.4 cm

## 2021-06-03 NOTE — Progress Notes (Signed)
*  PRELIMINARY RESULTS* ?Echocardiogram ?2D Echocardiogram has been performed. ? ?Andrew Watson ?06/03/2021, 10:22 AM ?

## 2021-06-06 DIAGNOSIS — H00012 Hordeolum externum right lower eyelid: Secondary | ICD-10-CM | POA: Diagnosis not present

## 2021-06-12 ENCOUNTER — Telehealth: Payer: Self-pay | Admitting: Cardiology

## 2021-06-12 NOTE — Telephone Encounter (Signed)
Patient informed. Copy sent to PCP °

## 2021-06-12 NOTE — Telephone Encounter (Signed)
Patient returning call.

## 2021-06-12 NOTE — Telephone Encounter (Signed)
-----   Message from Satira Sark, MD sent at 06/03/2021  1:00 PM EDT ----- Results reviewed.  Follow-up echocardiogram is relatively stable with normal TAVR function and just a mild increase in mean gradient from prior study, no major clinical significance.  Continue to observe as before.

## 2021-06-12 NOTE — Telephone Encounter (Signed)
Patient returning call for echo results. 

## 2021-06-17 ENCOUNTER — Encounter (INDEPENDENT_AMBULATORY_CARE_PROVIDER_SITE_OTHER): Payer: Self-pay | Admitting: *Deleted

## 2021-06-27 DIAGNOSIS — E7800 Pure hypercholesterolemia, unspecified: Secondary | ICD-10-CM | POA: Diagnosis not present

## 2021-06-27 DIAGNOSIS — E039 Hypothyroidism, unspecified: Secondary | ICD-10-CM | POA: Diagnosis not present

## 2021-06-27 DIAGNOSIS — Z952 Presence of prosthetic heart valve: Secondary | ICD-10-CM | POA: Diagnosis not present

## 2021-06-27 DIAGNOSIS — I2510 Atherosclerotic heart disease of native coronary artery without angina pectoris: Secondary | ICD-10-CM | POA: Diagnosis not present

## 2021-08-02 ENCOUNTER — Encounter: Payer: Self-pay | Admitting: Emergency Medicine

## 2021-08-02 ENCOUNTER — Ambulatory Visit
Admission: EM | Admit: 2021-08-02 | Discharge: 2021-08-02 | Disposition: A | Discharge status: Home / Self Care | Payer: Medicare Other | Attending: Nurse Practitioner | Admitting: Nurse Practitioner

## 2021-08-02 DIAGNOSIS — H00011 Hordeolum externum right upper eyelid: Secondary | ICD-10-CM | POA: Diagnosis not present

## 2021-08-02 DIAGNOSIS — H1031 Unspecified acute conjunctivitis, right eye: Secondary | ICD-10-CM | POA: Diagnosis not present

## 2021-08-02 MED ORDER — ERYTHROMYCIN 5 MG/GM OP OINT: 1.0000 | TOPICAL_OINTMENT | Freq: Every day | OPHTHALMIC | 0 refills | Status: AC

## 2021-08-02 NOTE — ED Triage Notes (Signed)
Right eye redness with pain and some itching with drainage x 3 days.  Has been using over the counter eye drops since last night.

## 2021-08-02 NOTE — Discharge Instructions (Addendum)
Use medication as prescribed.  Recommend using them in both eyes in the event that the infection spreads. Cool compresses to the eyes to help with pain or swelling. May continue the over-the-counter eyedrops you are using to help keep the eyes moist and decreased redness. Strict handwashing when applying medication.  Avoid rubbing or manipulating the eyes while symptoms persist. Discard any eye make-up that were using prior to your symptoms starting. Follow-up with Dr. Katy Fitch if symptoms do not improve within the next 72 hours.

## 2021-08-02 NOTE — ED Provider Notes (Signed)
[facility]    CSN: [id number] Arrival date & time: 08/02/21  0762      History   Chief Complaint No chief complaint on file.   HPI Andrew Watson is a 81 y.o. male.   HPI  Patient presents with right eye redness and pain with itching and drainage for the past 3 days.  Patient states he was seen by another doctor and was told that he had a stye in the right eye.  He states that he was prescribed medication, but does not recall the name of the medication.  He states he has been using some over-the-counter eye itch relief drops.  He denies fever, chills, loss of vision, change in vision, blurred vision, or headache.  He does not wear glasses or contacts.  Past Medical History:  Diagnosis Date   Aortic valve stenosis    Arthritis    CAD (coronary artery disease) 05/07/2011   Mild atherosclerosis by cardiac catheterization February 2021   GERD (gastroesophageal reflux disease)    History of atrial fibrillation 2013   Reportedly limited episode   History of kidney stones    History of MRSA infection    History of pancreatitis 2017   [facility]   History of pulmonary embolism 2017   History of TIA (transient ischemic attack)    November 2020   Hypothyroidism    S/P TAVR (transcatheter aortic valve replacement) 05/02/2019   s/p TAVR with a 26 mm Edwards Sapien 3 Ultra via the TF approach with Dr. Buena Irish & Dr. Cyndia Bent     Patient Active Problem List   Diagnosis Date Noted   GERD (gastroesophageal reflux disease)    Tiredness    Acute on chronic diastolic heart failure ([place]) 05/02/2019   S/P TAVR (transcatheter aortic valve replacement) 05/02/2019   History of TIA (transient ischemic attack)    Asymptomatic microscopic hematuria 04/07/2019   Calculus of kidney 04/07/2019   Aortic stenosis, severe    S/P  lap bilateral inguinal hernia repair Feb 2020 03/01/2018   Retroperitoneal fluid collection 04/20/2016   History of pulmonary embolism 08/12/2015    Duodenal stricture 08/10/2015   Gastric outlet obstruction 08/10/2015   H/O bypass gastrojejunostomy 08/10/2015   Pancreatic pseudocyst 08/01/2015   Insomnia 05/15/2011   Hypothyroidism 05/08/2011    Past Surgical History:  Procedure Laterality Date   BACK SURGERY     CARDIAC CATHETERIZATION     CHOLECYSTECTOMY     COLONOSCOPY  07/08/2011   Procedure: COLONOSCOPY;  Surgeon: Rogene Houston, MD;  [facility];  Service: Endoscopy;  Laterality: N/A;  [place], URETEROSCOPY AND STENT PLACEMENT Left 03/30/2019   Procedure: CYSTOSCOPY WITH LEFT  RETROGRADE , URETEROSCOPY HOLMIUM LASER AND STENT PLACEMENT;  Surgeon: Irine Seal, MD;  Location: [facility];  Service: Urology;  Laterality: Left;   DUODENOTOMY  09/29/2016   repair of fistula, jejunal resection   INGUINAL HERNIA REPAIR N/A 03/01/2018   Procedure: LAPAROSCOPIC TEP BILATERAL INGUINAL HERNIA REPAIR;  Surgeon: Johnathan Hausen, MD;  Location: [facility];  Service: General;  Laterality: N/A;   INTRAOPERATIVE TRANSTHORACIC ECHOCARDIOGRAM N/A 05/02/2019   Procedure: Intraoperative Transthoracic Echocardiogram;  Surgeon: Burnell Blanks, MD;  Location: [place];  Service: Open Heart Surgery;  Laterality: N/A;   JEJUNOSTOMY FEEDING TUBE     KNEE ARTHROSCOPY Left    LEFT HEART CATHETERIZATION WITH CORONARY ANGIOGRAM N/A 05/07/2011   Procedure: LEFT HEART CATHETERIZATION WITH CORONARY ANGIOGRAM;  Surgeon: Collier Salina  M Martinique, MD;  Location: [facility] CATH LAB;  Service: Cardiovascular;  Laterality: N/A;   PEG TUBE PLACEMENT     RIGHT/LEFT HEART CATH AND CORONARY ANGIOGRAPHY N/A 02/28/2019   Procedure: RIGHT/LEFT HEART CATH AND CORONARY ANGIOGRAPHY;  Surgeon: Burnell Blanks, MD;  Location: [place] CV LAB;  Service: Cardiovascular;  Laterality: N/A;   TRANSCATHETER AORTIC VALVE REPLACEMENT, TRANSFEMORAL  05/02/2019   TRANSCATHETER AORTIC VALVE REPLACEMENT, TRANSFEMORAL N/A 05/02/2019   Procedure: TRANSCATHETER  AORTIC VALVE REPLACEMENT, TRANSFEMORAL;  Surgeon: Burnell Blanks, MD;  Location: [place];  Service: Open Heart Surgery;  Laterality: N/A;   VASECTOMY         Home Medications    Prior to Admission medications   Medication Sig Start Date End Date Taking? Authorizing Provider  erythromycin ophthalmic ointment Place 1 Application into the right eye at bedtime for 7 days. 08/02/21 08/09/21 Yes Josalin Carneiro-Warren, Alda Lea, NP  aspirin 81 MG chewable tablet Chew 1 tablet (81 mg total) by mouth daily. 12/06/18   Deno Etienne, DO  clopidogrel (PLAVIX) 75 MG tablet Take 1 tablet (75 mg total) by mouth daily. 02/17/21   Satira Sark, MD  famotidine (PEPCID) 40 MG tablet Take 1 tablet (40 mg total) by mouth at bedtime. 01/28/21   Rogene Houston, MD  levothyroxine (SYNTHROID) 200 MCG tablet Take 200 mcg by mouth daily before breakfast.    [provider]    Family History Family History  Problem Relation Age of Onset   Arthritis Mother    Other Brother        staph infection    Social History Social History   Tobacco Use   Smoking status: Never   Smokeless tobacco: Never  Vaping Use   Vaping Use: Never used  Substance Use Topics   Alcohol use: No    Comment: none since Jun 26, 2015   Drug use: No     Allergies   Patient has no known allergies.   Review of Systems Review of Systems Per HPI  Physical Exam Triage Vital Signs ED Triage Vitals  Enc Vitals Group     BP 08/02/21 0959 137/72     Pulse Rate 08/02/21 0959 84     Resp 08/02/21 0959 18     Temp 08/02/21 0959 98.1 F (36.7 C)     Temp Source 08/02/21 0959 Oral     SpO2 08/02/21 0959 95 %     Weight --      Height --      Head Circumference --      Peak Flow --      Pain Score 08/02/21 1000 6     Pain Loc --      Pain Edu? --      Excl. in [place]? --    No data found.  Updated Vital Signs BP 137/72 (BP Location: Right Arm)   Pulse 84   Temp 98.1 F (36.7 C) (Oral)   Resp 18   SpO2 95%    Visual Acuity Right Eye Distance:   Left Eye Distance:   Bilateral Distance:    Right Eye Near:   Left Eye Near:    Bilateral Near:     Physical Exam Vitals and nursing note reviewed.  Constitutional:      General: He is not in acute distress.    Appearance: Normal appearance.  HENT:     Head: Normocephalic.     Nose: Nose normal.  Eyes:  General:        Right eye: Discharge present. No foreign body.     Extraocular Movements: Extraocular movements intact.     Right eye: Normal extraocular motion and no nystagmus.     Conjunctiva/sclera:     Right eye: Right conjunctiva is injected.     Pupils: Pupils are equal, round, and reactive to light.     Comments: Upper eyelid swelling of the right eye.  Inner portion of the upper eyelid is tender to palpation.  Yellow drainage is present.  Cardiovascular:     Rate and Rhythm: Normal rate and regular rhythm.     Pulses: Normal pulses.     Heart sounds: Normal heart sounds.  Pulmonary:     Effort: Pulmonary effort is normal.     Breath sounds: Normal breath sounds.  Abdominal:     General: Bowel sounds are normal.     Palpations: Abdomen is soft.  Skin:    General: Skin is warm and dry.  Neurological:     General: No focal deficit present.     Mental Status: He is alert and oriented to person, place, and time.  Psychiatric:        Mood and Affect: Mood normal.        Behavior: Behavior normal.      UC Treatments / Results  Labs (all labs ordered are listed, but only abnormal results are displayed) Labs Reviewed - No data to display  EKG   Radiology No results found.  Procedures Procedures (including critical care time)  Medications Ordered in UC Medications - No data to display  Initial Impression / Assessment and Plan / UC Course  I have reviewed the triage vital signs and the nursing notes.  Pertinent labs & imaging results that were available during my care of the patient were reviewed by me and  considered in my medical decision making (see chart for details).  Patient presents for complaints of right eye redness and swelling that has been present for the past 3 days.  Patient with prior diagnosis of hordeolum of the right eye.  Drainage is present on exam.  History of hordeolum, will treat patient with erythromycin ointment.  This will cover him for the hordeolum and conjunctivitis.  Patient was advised that if his symptoms do not improve, recommend that he follow-up with an eye doctor.  He states that he sees Dr. Carolynn Sayers in [place].  Supportive care recommendations were provided to the patient.  Patient advised to follow-up as needed. Final Clinical Impressions(s) / UC Diagnoses   Final diagnoses:  Hordeolum externum of right upper eyelid  Acute conjunctivitis of right eye, unspecified acute conjunctivitis type     Discharge Instructions      Use medication as prescribed.  Recommend using them in both eyes in the event that the infection spreads. Cool compresses to the eyes to help with pain or swelling. May continue the over-the-counter eyedrops you are using to help keep the eyes moist and decreased redness. Strict handwashing when applying medication.  Avoid rubbing or manipulating the eyes while symptoms persist. Discard any eye make-up that were using prior to your symptoms starting. Follow-up with Dr. Katy Fitch if symptoms do not improve within the next 72 hours.     ED Prescriptions     Medication Sig Dispense Auth. Provider   erythromycin ophthalmic ointment Place 1 Application into the right eye at bedtime for 7 days. 7 g Statia Burdick-Warren, Alda Lea, NP  PDMP not reviewed this encounter.   Tish Men, NP 08/02/21 1039

## 2021-08-04 DIAGNOSIS — H0011 Chalazion right upper eyelid: Secondary | ICD-10-CM | POA: Diagnosis not present

## 2021-08-04 DIAGNOSIS — H0102B Squamous blepharitis left eye, upper and lower eyelids: Secondary | ICD-10-CM | POA: Diagnosis not present

## 2021-08-04 DIAGNOSIS — Z961 Presence of intraocular lens: Secondary | ICD-10-CM | POA: Diagnosis not present

## 2021-08-04 DIAGNOSIS — H0102A Squamous blepharitis right eye, upper and lower eyelids: Secondary | ICD-10-CM | POA: Diagnosis not present

## 2021-09-05 DIAGNOSIS — G8929 Other chronic pain: Secondary | ICD-10-CM | POA: Diagnosis not present

## 2021-09-05 DIAGNOSIS — M25562 Pain in left knee: Secondary | ICD-10-CM | POA: Diagnosis not present

## 2021-09-12 DIAGNOSIS — M1711 Unilateral primary osteoarthritis, right knee: Secondary | ICD-10-CM | POA: Diagnosis not present

## 2021-09-12 DIAGNOSIS — M1712 Unilateral primary osteoarthritis, left knee: Secondary | ICD-10-CM | POA: Diagnosis not present

## 2021-09-15 ENCOUNTER — Emergency Department (HOSPITAL_COMMUNITY): Payer: Medicare Other

## 2021-09-15 ENCOUNTER — Encounter (HOSPITAL_COMMUNITY): Payer: Self-pay

## 2021-09-15 ENCOUNTER — Emergency Department (HOSPITAL_COMMUNITY)
Admission: EM | Admit: 2021-09-15 | Discharge: 2021-09-15 | Disposition: A | Discharge status: Home / Self Care | Payer: Medicare Other | Attending: Emergency Medicine | Admitting: Emergency Medicine

## 2021-09-15 DIAGNOSIS — R109 Unspecified abdominal pain: Secondary | ICD-10-CM | POA: Insufficient documentation

## 2021-09-15 DIAGNOSIS — E860 Dehydration: Secondary | ICD-10-CM | POA: Diagnosis not present

## 2021-09-15 DIAGNOSIS — M791 Myalgia, unspecified site: Secondary | ICD-10-CM | POA: Diagnosis not present

## 2021-09-15 DIAGNOSIS — R079 Chest pain, unspecified: Secondary | ICD-10-CM | POA: Diagnosis not present

## 2021-09-15 DIAGNOSIS — M79602 Pain in left arm: Secondary | ICD-10-CM | POA: Insufficient documentation

## 2021-09-15 DIAGNOSIS — I509 Heart failure, unspecified: Secondary | ICD-10-CM | POA: Diagnosis not present

## 2021-09-15 DIAGNOSIS — Z7982 Long term (current) use of aspirin: Secondary | ICD-10-CM | POA: Diagnosis not present

## 2021-09-15 DIAGNOSIS — I2510 Atherosclerotic heart disease of native coronary artery without angina pectoris: Secondary | ICD-10-CM | POA: Diagnosis not present

## 2021-09-15 DIAGNOSIS — Z79899 Other long term (current) drug therapy: Secondary | ICD-10-CM | POA: Diagnosis not present

## 2021-09-15 DIAGNOSIS — R2243 Localized swelling, mass and lump, lower limb, bilateral: Secondary | ICD-10-CM | POA: Insufficient documentation

## 2021-09-15 DIAGNOSIS — R252 Cramp and spasm: Secondary | ICD-10-CM

## 2021-09-15 DIAGNOSIS — E039 Hypothyroidism, unspecified: Secondary | ICD-10-CM | POA: Diagnosis not present

## 2021-09-15 DIAGNOSIS — R0789 Other chest pain: Secondary | ICD-10-CM | POA: Diagnosis not present

## 2021-09-15 LAB — COMPREHENSIVE METABOLIC PANEL
ALT: 25 U/L (ref 0–44)
AST: 22 U/L (ref 15–41)
Albumin: 4.3 g/dL (ref 3.5–5.0)
Alkaline Phosphatase: 46 U/L (ref 38–126)
Anion gap: 8 (ref 5–15)
BUN: 24 mg/dL — ABNORMAL HIGH (ref 8–23)
CO2: 31 mmol/L (ref 22–32)
Calcium: 10.1 mg/dL (ref 8.9–10.3)
Chloride: 101 mmol/L (ref 98–111)
Creatinine, Ser: 1.19 mg/dL (ref 0.61–1.24)
GFR, Estimated: >60 mL/min (ref >60)
Glucose, Bld: 115 mg/dL — ABNORMAL HIGH (ref 70–99)
Potassium: 5.2 mmol/L — ABNORMAL HIGH (ref 3.5–5.1)
Sodium: 140 mmol/L (ref 135–145)
Total Bilirubin: 1.2 mg/dL (ref 0.3–1.2)
Total Protein: 7.8 g/dL (ref 6.5–8.1)

## 2021-09-15 LAB — TROPONIN I (HIGH SENSITIVITY)
Troponin I (High Sensitivity): 11 ng/L (ref <18)
Troponin I (High Sensitivity): 10 ng/L (ref <18)

## 2021-09-15 LAB — CBC
HCT: 46.8 % (ref 39.0–52.0)
Hemoglobin: 15 g/dL (ref 13.0–17.0)
MCH: 29.8 pg (ref 26.0–34.0)
MCHC: 32.1 g/dL (ref 30.0–36.0)
MCV: 92.9 fL (ref 80.0–100.0)
Platelets: 185 10*3/uL (ref 150–400)
RBC: 5.04 MIL/uL (ref 4.22–5.81)
RDW: 13.7 % (ref 11.5–15.5)
WBC: 9.4 10*3/uL (ref 4.0–10.5)
nRBC: 0 % (ref 0.0–0.2)

## 2021-09-15 LAB — MAGNESIUM: Magnesium: 2.1 mg/dL (ref 1.7–2.4)

## 2021-09-15 LAB — LIPASE, BLOOD: Lipase: 38 U/L (ref 11–51)

## 2021-09-15 MED ORDER — LACTATED RINGERS IV BOLUS
1000.0000 mL | Freq: Once | INTRAVENOUS | Status: AC
  Administered 2021-09-15: 1000 mL via INTRAVENOUS

## 2021-09-15 MED ORDER — METHOCARBAMOL 500 MG PO TABS: 500.0000 mg | ORAL_TABLET | Freq: Two times a day (BID) | ORAL | 0 refills | Status: DC | PRN

## 2021-09-15 MED ORDER — METHOCARBAMOL 500 MG PO TABS
500.0000 mg | ORAL_TABLET | Freq: Once | ORAL | Status: AC
  Administered 2021-09-15: 500 mg via ORAL
  Filled 2021-09-15: qty 1

## 2021-09-15 MED ORDER — SODIUM ZIRCONIUM CYCLOSILICATE 5 G PO PACK
10.0000 g | PACK | Freq: Once | ORAL | Status: AC
Start: 2021-09-15 — End: 2021-09-15
  Administered 2021-09-15: 10 g via ORAL
  Filled 2021-09-15: qty 2

## 2021-09-15 NOTE — ED Triage Notes (Signed)
Pt states he is having abd cramping. Pt also endorses left arm pain . Pt is concerned that he is having a heart attack. Pt states mild chest pain, but more cramping. Pt is also concerned for dehydration. Pt endorses numbness throughout body.

## 2021-09-15 NOTE — ED Provider Notes (Signed)
[facility] EMERGENCY DEPARTMENT Provider Note   CSN: [id number] Arrival date & time: 09/15/21  1442     History {Add pertinent medical, surgical, social history, OB history to HPI:1} Chief Complaint  Patient presents with   Arm Pain   Abdominal Cramping    Andrew Watson is a 81 y.o. male.   Arm Pain Associated symptoms include chest pain.  Abdominal Cramping Associated symptoms include chest pain.  Patient presents for diffuse cramping and concern of dehydration.  Medical history includes hypothyroidism, prior PE, prior pancreatitis, TIA, CHF, GERD, arthritis, CAD, TAVR, gastric bypass, bilateral inguinal hernia repair.  Patient reports symptoms since yesterday.  While at church, he had migrating areas of cramping in his chest walls.  Overnight, he had cramping in his legs.  Today, he had cramping in his left arm which raised concern for cardiac cause.  Patient denies any nausea, vomiting, chills, diarrhea.  He denies any shortness of breath.     Home Medications Prior to Admission medications   Medication Sig Start Date End Date Taking? Authorizing Provider  aspirin 81 MG chewable tablet Chew 1 tablet (81 mg total) by mouth daily. 12/06/18   Deno Etienne, DO  clopidogrel (PLAVIX) 75 MG tablet Take 1 tablet (75 mg total) by mouth daily. 02/17/21   Satira Sark, MD  famotidine (PEPCID) 40 MG tablet Take 1 tablet (40 mg total) by mouth at bedtime. 01/28/21   Rogene Houston, MD  levothyroxine (SYNTHROID) 200 MCG tablet Take 200 mcg by mouth daily before breakfast.    [provider]      Allergies    Patient has no known allergies.    Review of Systems   Review of Systems  Cardiovascular:  Positive for chest pain.  Musculoskeletal:  Positive for myalgias.  All other systems reviewed and are negative.   Physical Exam Updated Vital Signs BP 136/88 (BP Location: Right Arm)   Pulse (!) 102   Temp (!) 97.5 F (36.4 C) (Oral)   Resp 18   Wt 97.3 kg   SpO2  99%   BMI 30.78 kg/m  Physical Exam Vitals and nursing note reviewed.  Constitutional:      General: He is not in acute distress.    Appearance: Normal appearance. He is well-developed. He is not ill-appearing, toxic-appearing or diaphoretic.  HENT:     Head: Normocephalic and atraumatic.     Right Ear: External ear normal.     Left Ear: External ear normal.     Nose: Nose normal.     Mouth/Throat:     Mouth: Mucous membranes are moist.     Pharynx: Oropharynx is clear.  Eyes:     Extraocular Movements: Extraocular movements intact.     Conjunctiva/sclera: Conjunctivae normal.  Cardiovascular:     Rate and Rhythm: Normal rate.  Pulmonary:     Effort: Pulmonary effort is normal. No respiratory distress.  Abdominal:     General: There is no distension.     Palpations: Abdomen is soft.  Musculoskeletal:        General: No swelling. Normal range of motion.     Cervical back: Normal range of motion and neck supple.     Right lower leg: No edema.     Left lower leg: No edema.  Skin:    General: Skin is warm and dry.     Coloration: Skin is not jaundiced or pale.     Findings: No erythema.  Neurological:  General: No focal deficit present.     Mental Status: He is alert and oriented to person, place, and time.     Cranial Nerves: No cranial nerve deficit.     Sensory: No sensory deficit.     Motor: No weakness.     Coordination: Coordination normal.  Psychiatric:        Mood and Affect: Mood normal.        Behavior: Behavior normal.        Thought Content: Thought content normal.        Judgment: Judgment normal.     ED Results / Procedures / Treatments   Labs (all labs ordered are listed, but only abnormal results are displayed) Labs Reviewed  CBC  LIPASE, BLOOD  COMPREHENSIVE METABOLIC PANEL  URINALYSIS, ROUTINE W REFLEX MICROSCOPIC  TROPONIN I (HIGH SENSITIVITY)    EKG None  Radiology DG Chest 2 View  Result Date: 09/15/2021 CLINICAL DATA:  Chest pain  EXAM: CHEST - 2 VIEW COMPARISON:  Previous studies including the examination of 04/28/2019 FINDINGS: Cardiac size is within normal limits. There is prosthetic aortic valve. Lung fields are clear of any infiltrates or pulmonary edema. Blunting of right lateral CP angle has not changed. There is no significant pleural effusion or pneumothorax. IMPRESSION: No active cardiopulmonary disease. Electronically Signed   By: Elmer Picker M.D.   On: 09/15/2021 15:52    Procedures Procedures  {Document cardiac monitor, telemetry assessment procedure when appropriate:1}  Medications Ordered in ED Medications - No data to display  ED Course/ Medical Decision Making/ A&P                           Medical Decision Making Amount and/or Complexity of Data Reviewed Labs: ordered. Radiology: ordered.   ***  {Document critical care time when appropriate:1} {Document review of labs and clinical decision tools ie heart score, Chads2Vasc2 etc:1}  {Document your independent review of radiology images, and any outside records:1} {Document your discussion with family members, caretakers, and with consultants:1} {Document social determinants of health affecting pt's care:1} {Document your decision making why or why not admission, treatments were needed:1} Final Clinical Impression(s) / ED Diagnoses Final diagnoses:  None    Rx / DC Orders ED Discharge Orders     None

## 2021-09-15 NOTE — ED Provider Triage Note (Signed)
Emergency Medicine Provider Triage Evaluation Note  Andrew Watson , a 81 y.o. male  was evaluated in triage.  Pt complains of generalized muscle cramps and abdominal cramping.  States symptoms have been going on for 2 days.  Patient believes he is dehydrated, stating that this is happened to him before.  Denies any new medications, recent nausea or vomiting.  No exertional injuries.  Review of Systems  Positive: Muscle cramps, abdominal cramping Negative: Shortness of breath, extremity or facial weakness.  Physical Exam  BP 136/88 (BP Location: Right Arm)   Pulse (!) 102   Temp (!) 97.5 F (36.4 C) (Oral)   Resp 18   Wt 97.3 kg   SpO2 99%   BMI 30.78 kg/m  Gen:   Awake, no distress   Resp:  Normal effort  MSK:   Moves extremities without difficulty  Other:  No peripheral edema  Medical Decision Making  Medically screening exam initiated at 4:06 PM.  Appropriate orders placed.  Andrew Watson was informed that the remainder of the evaluation will be completed by another provider, this initial triage assessment does not replace that evaluation, and the importance of remaining in the ED until their evaluation is complete.  Patient here with complaint of abdominal cramping and muscle cramping.  He is concerned that he is dehydrated.  States this is happened to him before.  He is mildly tachycardic.  Denies shortness of breath.  You will need further evaluation in the emergency department.  He is agreeable to plan.    Kem Parkinson, PA-C 09/15/21 1616

## 2021-09-15 NOTE — Discharge Instructions (Signed)
Medication for muscle relaxer was sent to your pharmacy.  Take this only as needed.  Continue to eat well and stay hydrated.  Return to the emergency department at any time for any new or worsening symptoms of concern.

## 2021-09-17 DIAGNOSIS — M791 Myalgia, unspecified site: Secondary | ICD-10-CM | POA: Diagnosis not present

## 2021-09-19 DIAGNOSIS — R748 Abnormal levels of other serum enzymes: Secondary | ICD-10-CM | POA: Diagnosis not present

## 2021-09-19 DIAGNOSIS — M791 Myalgia, unspecified site: Secondary | ICD-10-CM | POA: Diagnosis not present

## 2021-09-25 DIAGNOSIS — R748 Abnormal levels of other serum enzymes: Secondary | ICD-10-CM | POA: Diagnosis not present

## 2021-09-25 DIAGNOSIS — M791 Myalgia, unspecified site: Secondary | ICD-10-CM | POA: Diagnosis not present

## 2021-09-30 DIAGNOSIS — R252 Cramp and spasm: Secondary | ICD-10-CM | POA: Diagnosis not present

## 2021-09-30 DIAGNOSIS — M25562 Pain in left knee: Secondary | ICD-10-CM | POA: Diagnosis not present

## 2021-09-30 DIAGNOSIS — R748 Abnormal levels of other serum enzymes: Secondary | ICD-10-CM | POA: Diagnosis not present

## 2021-10-07 DIAGNOSIS — M1712 Unilateral primary osteoarthritis, left knee: Secondary | ICD-10-CM | POA: Diagnosis not present

## 2021-10-07 DIAGNOSIS — K080 Exfoliation of teeth due to systemic causes: Secondary | ICD-10-CM | POA: Diagnosis not present

## 2021-10-22 DIAGNOSIS — K080 Exfoliation of teeth due to systemic causes: Secondary | ICD-10-CM | POA: Diagnosis not present

## 2021-10-29 ENCOUNTER — Telehealth: Payer: Self-pay

## 2021-10-29 NOTE — Patient Outreach (Signed)
  Care Coordination   10/29/2021 Name: COAL NEARHOOD MRN: [id number] DOB: 06-26-1940   Care Coordination Outreach Attempts:  An unsuccessful telephone outreach was attempted today to offer the patient information about available care coordination services as a benefit of their health plan.   Follow Up Plan:  Additional outreach attempts will be made to offer the patient care coordination information and services.   Encounter Outcome:  Pt. Request to Call Back  Care Coordination Interventions Activated:  No   Care Coordination Interventions:  No, not indicated      Jone Baseman, RN, MSN [facility] Care Management Coordinator Direct Line [phone]

## 2021-11-04 DIAGNOSIS — K080 Exfoliation of teeth due to systemic causes: Secondary | ICD-10-CM | POA: Diagnosis not present

## 2021-11-12 ENCOUNTER — Telehealth: Payer: Self-pay

## 2021-11-12 NOTE — Patient Outreach (Signed)
  Care Coordination   11/12/2021 Name: Andrew Watson MRN: [id number] DOB: 07/22/1940   Care Coordination Outreach Attempts:  A second unsuccessful outreach was attempted today to offer the patient with information about available care coordination services as a benefit of their health plan.     Follow Up Plan:  Additional outreach attempts will be made to offer the patient care coordination information and services.   Encounter Outcome:  No Answer  Care Coordination Interventions Activated:  No   Care Coordination Interventions:  No, not indicated    Jone Baseman, RN, MSN [facility] Care Management Care Management Coordinator Direct Line [phone]

## 2021-11-13 DIAGNOSIS — G629 Polyneuropathy, unspecified: Secondary | ICD-10-CM | POA: Diagnosis not present

## 2021-11-13 DIAGNOSIS — R350 Frequency of micturition: Secondary | ICD-10-CM | POA: Diagnosis not present

## 2021-11-13 DIAGNOSIS — E039 Hypothyroidism, unspecified: Secondary | ICD-10-CM | POA: Diagnosis not present

## 2021-11-18 DIAGNOSIS — M1712 Unilateral primary osteoarthritis, left knee: Secondary | ICD-10-CM | POA: Diagnosis not present

## 2021-11-25 DIAGNOSIS — M1712 Unilateral primary osteoarthritis, left knee: Secondary | ICD-10-CM | POA: Diagnosis not present

## 2021-11-27 ENCOUNTER — Telehealth: Payer: Self-pay

## 2021-11-27 NOTE — Patient Instructions (Signed)
Visit Information  Thank you for taking time to visit with me today. Please don't hesitate to contact me if I can be of assistance to you.   Following are the goals we discussed today:   Goals Addressed             This Visit's Progress    COMPLETED: Care coordination activities-No follow up required       Care Coordination Interventions: Advised patient to   schedule annual wellness visit.  Discussed [facility] services and support.  Patient declined.           If you are experiencing a Mental Health or [place] or need someone to talk to, please    The patient verbalized understanding of instructions, educational materials, and care plan provided today and DECLINED offer to receive copy of patient instructions, educational materials, and care plan.   No further follow up required: patient decline  Jone Baseman, RN, MSN [facility] Care Management Coordinator Direct Line [phone]

## 2021-11-27 NOTE — Patient Outreach (Signed)
  Care Coordination   Initial Visit Note   11/27/2021 Name: SEYED HEFFLEY MRN: [id number] DOB: Feb 26, 1940  DELOS KLICH is a 81 y.o. year old male who sees Antony Contras, MD for primary care. I spoke with  [name] by phone today.  What matters to the patients health and wellness today?  none    Goals Addressed             This Visit's Progress    COMPLETED: Care coordination activities-No follow up required       Care Coordination Interventions: Advised patient to   schedule annual wellness visit.  Discussed [facility] services and support.  Patient declined.          SDOH assessments and interventions completed:  Yes  SDOH Interventions Today    Flowsheet Row Most Recent Value  SDOH Interventions   Transportation Interventions Intervention Not Indicated  Utilities Interventions Intervention Not Indicated        Care Coordination Interventions Activated:  Yes  Care Coordination Interventions:  Yes, provided   Follow up plan: No further intervention required.   Encounter Outcome:  Pt. Visit Completed   Jone Baseman, RN, MSN [place] [facility] Care Management Coordinator Direct Line [phone]

## 2021-12-02 DIAGNOSIS — M1712 Unilateral primary osteoarthritis, left knee: Secondary | ICD-10-CM | POA: Diagnosis not present

## 2021-12-11 DIAGNOSIS — K080 Exfoliation of teeth due to systemic causes: Secondary | ICD-10-CM | POA: Diagnosis not present

## 2021-12-13 ENCOUNTER — Other Ambulatory Visit: Payer: Self-pay | Admitting: Cardiology

## 2022-01-03 ENCOUNTER — Other Ambulatory Visit: Payer: Self-pay | Admitting: Internal Medicine

## 2022-01-07 ENCOUNTER — Other Ambulatory Visit: Payer: Self-pay | Admitting: Internal Medicine

## 2022-01-08 DIAGNOSIS — K080 Exfoliation of teeth due to systemic causes: Secondary | ICD-10-CM | POA: Diagnosis not present

## 2022-02-10 DIAGNOSIS — K080 Exfoliation of teeth due to systemic causes: Secondary | ICD-10-CM | POA: Diagnosis not present

## 2022-02-12 DIAGNOSIS — M170 Bilateral primary osteoarthritis of knee: Secondary | ICD-10-CM | POA: Diagnosis not present

## 2022-02-16 DIAGNOSIS — I700 Atherosclerosis of aorta: Secondary | ICD-10-CM | POA: Diagnosis not present

## 2022-02-16 DIAGNOSIS — E538 Deficiency of other specified B group vitamins: Secondary | ICD-10-CM | POA: Diagnosis not present

## 2022-02-16 DIAGNOSIS — G629 Polyneuropathy, unspecified: Secondary | ICD-10-CM | POA: Diagnosis not present

## 2022-02-16 DIAGNOSIS — E039 Hypothyroidism, unspecified: Secondary | ICD-10-CM | POA: Diagnosis not present

## 2022-02-26 ENCOUNTER — Telehealth: Payer: Self-pay | Admitting: *Deleted

## 2022-02-26 NOTE — Telephone Encounter (Signed)
   Name: Andrew Watson  DOB: 04-13-1940  MRN: [id number]  Primary Cardiologist: Rozann Lesches, MD  Chart reviewed as part of pre-operative protocol coverage. Because of Andrew Watson past medical history and time since last visit, he will require a follow-up in-office visit in order to better assess preoperative cardiovascular risk.  Pre-op covering staff: - Please schedule appointment and call patient to inform them. If patient already had an upcoming appointment within acceptable timeframe, please add "pre-op clearance" to the appointment notes so provider is aware. - Please contact requesting surgeon's office via preferred method (i.e, phone, fax) to inform them of need for appointment prior to surgery.    [place], [place]  02/26/2022, 3:47 PM

## 2022-02-26 NOTE — Telephone Encounter (Signed)
   Pre-operative Risk Assessment    Patient Name: Andrew Watson  DOB: 1940-07-08 MRN: [id number]      Request for Surgical Clearance    Procedure:   right total knee arthroplasty  Date of Surgery:  Clearance 05/18/22                                 Surgeon:  Dr. Gaynelle Arabian Surgeon's Group or Practice Name:  [facility]  Phone number:  [phone] Fax number:  [phone]   Type of Clearance Requested:   - Medical  - Pharmacy:  [facility] - requesting instructions   Type of Anesthesia:   choice    Additional requests/questions:   Fax recommendations to:  [name] [phone]  Signed, Lovina Reach   02/26/2022, 12:43 PM

## 2022-03-02 NOTE — Telephone Encounter (Signed)
Michae Kava, CMA  [facility] [place] Scheduling Hi everyone,  I was going to call the pt to schedule an appt for pre op clearance and I believe he is due for appt with Dr. [name]. I did see a spot on hold for PROVIDER USE on 04/14/22 @ 10:40. I did not know if I was able to offer that the pt. I could schedule with APP, but since he is due for yrly f/u I thought I would see if the pt could see MD. Let me know if this is ok to use. I will call the pt, as I know you guys are busy as well and I am so very grateful for all the help you give.  Thank you Arbie Cookey

## 2022-03-03 NOTE — Telephone Encounter (Signed)
Follow up appt scheduled with Ermalinda Barrios, PA; patient agreeable voiced understanding.

## 2022-03-31 DIAGNOSIS — K080 Exfoliation of teeth due to systemic causes: Secondary | ICD-10-CM | POA: Diagnosis not present

## 2022-03-31 NOTE — Progress Notes (Deleted)
Cardiology Office Note:    Date:  03/31/2022   ID:  Andrew Watson, DOB 1940-09-14, MRN [id number]  PCP:  Antony Contras, [place] Prov[facility] Cardiologist:  Rozann Lesches, MD { Click to update primary MD,subspecialty MD or APP then REFRESH:1}    Referring MD: Antony Contras, MD   Chief Complaint:  No chief complaint on file. {Click here for Visit Info    :1}    History of Present Illness:   [name] is a 82 y.o. male with a history of GERD, TIA (11/2018), remote atrial fibrillation (not on [place]), renal stone with hydronephrosis requiring ureteroscopic stone extraction/stenting, history of PE, hypothyroidism, and severe aortic stenosis s/p TAVR (05/02/19) Diagnostic cath 02/28/19 showed mild non obst CAD and confirmed severe AS.     Patient last saw Dr. [name] 05/2021 and doing well. F/u echo stable-see below.  Patient on my schedule for preop clearance for TKR by Dr. [name].    Past Medical History:  Diagnosis Date   Aortic valve stenosis    Arthritis    CAD (coronary artery disease) 05/07/2011   Mild atherosclerosis by cardiac catheterization February 2021   GERD (gastroesophageal reflux disease)    History of atrial fibrillation 2013   Reportedly limited episode   History of kidney stones    History of MRSA infection    History of pancreatitis 2017   [facility]   History of pulmonary embolism 2017   History of TIA (transient ischemic attack)    November 2020   Hypothyroidism    S/P TAVR (transcatheter aortic valve replacement) 05/02/2019   s/p TAVR with a 26 mm Edwards Sapien 3 Ultra via the TF approach with Dr. Buena Irish & Dr. Cyndia Bent    Current Medications: No outpatient medications have been marked as taking for the 04/14/22 encounter (Appointment) with [name], PA-C.    Allergies:   Patient has no known allergies.   Social History   Tobacco Use   Smoking status: Never   Smokeless tobacco: Never  Vaping Use   Vaping Use:  Never used  Substance Use Topics   Alcohol use: No    Comment: none since Jun 26, 2015   Drug use: No    Family Hx: The patient's family history includes Arthritis in his mother; Other in his brother.  ROS     Physical Exam:    VS:  There were no vitals taken for this visit.    Wt Readings from Last 3 Encounters:  09/15/21 214 lb 8 oz (97.3 kg)  05/14/21 220 lb 12.8 oz (100.2 kg)  01/28/21 216 lb (98 kg)    Physical Exam  GEN: Well nourished, well developed, in no acute distress  HEENT: normal  Neck: no JVD, carotid bruits, or masses Cardiac:RRR; no murmurs, rubs, or gallops  Respiratory:  clear to auscultation bilaterally, normal work of breathing GI: soft, nontender, nondistended, + BS Ext: without cyanosis, clubbing, or edema, Good distal pulses bilaterally MS: no deformity or atrophy  Skin: warm and dry, no rash Neuro:  Alert and Oriented x 3, Strength and sensation are intact Psych: euthymic mood, full affect        EKGs/Labs/Other Test Reviewed:    EKG:  EKG is *** ordered today.  The ekg ordered today demonstrates ***  Recent Labs: 09/15/2021: ALT 25; BUN 24; Creatinine, Ser 1.19; Hemoglobin 15.0; Magnesium 2.1; Platelets 185; Potassium 5.2; Sodium 140   Recent Lipid Panel No results for input(s): "CHOL", "  TRIG", "HDL", "VLDL", "LDLCALC", "LDLDIRECT" in the last 8760 hours.   Prior CV Studies: {Select studies to display:26339}    Echo 05/2021 IMPRESSIONS     1. Left ventricular ejection fraction, by estimation, is 60 to 65%. The  left ventricle has normal function. The left ventricle has no regional  wall motion abnormalities. There is mild left ventricular hypertrophy.  Left ventricular diastolic parameters  are consistent with Grade II diastolic dysfunction (pseudonormalization).  Elevated left ventricular end-diastolic pressure.   2. Right ventricular systolic function is low normal. The right  ventricular size is mildly enlarged. Tricuspid  regurgitation signal is  inadequate for assessing PA pressure.   3. Left atrial size was moderately dilated.   4. The mitral valve is degenerative. Trivial mitral valve regurgitation.   5. The aortic valve has been repaired/replaced. Aortic valve  regurgitation is not visualized. There is a 26 mm Sapien prosthetic (TAVR)  valve present in the aortic position. Aortic valve mean gradient measures  12.0 mmHg. No paravalvular regurgitation.   6. Unable to estimate CVP.   Comparison(s): Prior images reviewed side by side. Normally functioning  TAVR prosthesis, mean gradient mildly increased to 12 mmHg from 7 mmHg.   FINDINGS   Left Ventricle: Left ventricular ejection fraction, by estimation, is 60  to 65%. The left ventricle has normal function. The left ventricle has no  regional wall motion abnormalities. The left ventricular internal cavity  size was normal in size. There is   mild left ventricular hypertrophy. Left ventricular diastolic parameters  are consistent with Grade II diastolic dysfunction (pseudonormalization).  Elevated left ventricular end-diastolic pressure.   Right Ventricle: The right ventricular size is mildly enlarged. No  increase in right ventricular wall thickness. Right ventricular systolic  function is low normal. Tricuspid regurgitation signal is inadequate for  assessing PA pressure.   Left Atrium: Left atrial size was moderately dilated.   Right Atrium: Right atrial size was normal in size.   Pericardium: There is no evidence of pericardial effusion.   Mitral Valve: The mitral valve is degenerative in appearance. There is  mild thickening of the mitral valve leaflet(s). There is mild  calcification of the mitral valve leaflet(s). Mild mitral annular  calcification. Trivial mitral valve regurgitation.   Tricuspid Valve: The tricuspid valve is grossly normal. Tricuspid valve  regurgitation is trivial.   Aortic Valve: The aortic valve has been  repaired/replaced. Aortic valve  regurgitation is not visualized. Aortic valve mean gradient measures 12.0  mmHg. Aortic valve peak gradient measures 22.7 mmHg. Aortic valve area, by  VTI measures 1.06 cm. There is a   26 mm Sapien prosthetic, stented (TAVR) valve present in the aortic  position.   Pulmonic Valve: The pulmonic valve was grossly normal. Pulmonic valve  regurgitation is trivial.   Aorta: The aortic root is normal in size and structure.   Venous: Unable to estimate CVP. The inferior vena cava was not well  visualized.   IAS/Shunts: No atrial level shunt detected by color flow Doppler.       Risk Assessment/Calculations/Metrics:   {Does this patient have ATRIAL FIBRILLATION?[phone]}     No BP recorded.  {Refresh Note OR Click here to enter BP  :1}***    ASSESSMENT & PLAN:   No problem-specific Assessment & Plan notes found for this encounter.  Preop clearance  for TKR by Dr. Wynelle Link 05/18/22   Severe AS s/p TAVR:echo 05/2021 showed EF 60%, normally functioning TAVR with a mean gradient of 12 mmHg  and no PVL. He has NYHA class I symptoms. SBE prophylaxis discussed; he has amoxicillin. Continue on a baby aspirin 81 mg daily.    Chronic diastolic CHF: appears euvolemic off diuretic therapy.           {Are you ordering a CV Procedure (e.g. stress test, cath, DCCV, TEE, etc)?   Press F2        :[id number]   Dispo:  No follow-ups on file.   Medication Adjustments/Labs and Tests Ordered: Current medicines are reviewed at length with the patient today.  Concerns regarding medicines are outlined above.  Tests Ordered: No orders of the defined types were placed in this encounter.  Medication Changes: No orders of the defined types were placed in this encounter.  Signed, Ermalinda Barrios, PA-C  03/31/2022 2:14 PM    [address], [place], [place]  [address] Phone: [phone]; Fax: [phone]

## 2022-04-06 DIAGNOSIS — K080 Exfoliation of teeth due to systemic causes: Secondary | ICD-10-CM | POA: Diagnosis not present

## 2022-04-14 ENCOUNTER — Ambulatory Visit: Payer: Medicare Other | Admitting: Physician Assistant

## 2022-04-20 DIAGNOSIS — M1711 Unilateral primary osteoarthritis, right knee: Secondary | ICD-10-CM | POA: Diagnosis not present

## 2022-04-20 DIAGNOSIS — M25561 Pain in right knee: Secondary | ICD-10-CM | POA: Diagnosis not present

## 2022-04-27 NOTE — H&P (Cosign Needed Addendum)
TOTAL KNEE ADMISSION H&P  Patient is being admitted for left total knee arthroplasty.  Subjective:  Chief Complaint: Left knee pain.  HPI: Andrew Watson, 82 y.o. male has a history of pain and functional disability in the left knee due to arthritis and has failed non-surgical conservative treatments for greater than 12 weeks to include NSAID's and/or analgesics, corticosteriod injections, flexibility and strengthening excercises, and activity modification. Onset of symptoms was gradual, starting several years ago with gradually worsening course since that time. The patient noted no past surgery on the left knee.  Patient currently rates pain in the left knee at 8 out of 10 with activity. Patient has night pain, worsening of pain with activity and weight bearing, and pain that interferes with activities of daily living. Patient has evidence of  severe end-stage arthritis in the right knee, bone-on-bone in the medial and patellofemoral compartments with massive osteophytes. The left knee shows bone-on-bone medial but far less patellofemoral disease and less osteophyte formation  by imaging studies. There is no active infection.  Patient Active Problem List   Diagnosis Date Noted   GERD (gastroesophageal reflux disease)    Tiredness    Acute on chronic diastolic heart failure 123456   S/P TAVR (transcatheter aortic valve replacement) 05/02/2019   History of TIA (transient ischemic attack)    Asymptomatic microscopic hematuria 04/07/2019   Calculus of kidney 04/07/2019   Aortic stenosis, severe    S/P  lap bilateral inguinal hernia repair Feb 2020 03/01/2018   Retroperitoneal fluid collection 04/20/2016   History of pulmonary embolism 08/12/2015   Duodenal stricture 08/10/2015   Gastric outlet obstruction 08/10/2015   H/O bypass gastrojejunostomy 08/10/2015   Pancreatic pseudocyst 08/01/2015   Insomnia 05/15/2011   Hypothyroidism 05/08/2011    Past Medical History:  Diagnosis Date    Aortic valve stenosis    Arthritis    CAD (coronary artery disease) 05/07/2011   Mild atherosclerosis by cardiac catheterization February 2021   GERD (gastroesophageal reflux disease)    History of atrial fibrillation 2013   Reportedly limited episode   History of kidney stones    History of MRSA infection    History of pancreatitis 2017   [facility]   History of pulmonary embolism 2017   History of TIA (transient ischemic attack)    November 2020   Hypothyroidism    S/P TAVR (transcatheter aortic valve replacement) 05/02/2019   s/p TAVR with a 26 mm Edwards Sapien 3 Ultra via the TF approach with Dr. Buena Irish & Dr. Cyndia Bent     Past Surgical History:  Procedure Laterality Date   BACK SURGERY     CARDIAC CATHETERIZATION     CHOLECYSTECTOMY     COLONOSCOPY  07/08/2011   Procedure: COLONOSCOPY;  Surgeon: Rogene Houston, MD;  Location: [facility];  Service: Endoscopy;  Laterality: N/A;  [place], URETEROSCOPY AND STENT PLACEMENT Left 03/30/2019   Procedure: CYSTOSCOPY WITH LEFT  RETROGRADE , URETEROSCOPY HOLMIUM LASER AND STENT PLACEMENT;  Surgeon: Irine Seal, MD;  Location: [facility];  Service: Urology;  Laterality: Left;   DUODENOTOMY  09/29/2016   repair of fistula, jejunal resection   INGUINAL HERNIA REPAIR N/A 03/01/2018   Procedure: LAPAROSCOPIC TEP BILATERAL INGUINAL HERNIA REPAIR;  Surgeon: Johnathan Hausen, MD;  Location: [facility];  Service: General;  Laterality: N/A;   INTRAOPERATIVE TRANSTHORACIC ECHOCARDIOGRAM N/A 05/02/2019   Procedure: Intraoperative Transthoracic Echocardiogram;  Surgeon: Burnell Blanks, MD;  Location: [place];  Service: Open Heart Surgery;  Laterality: N/A;   JEJUNOSTOMY FEEDING TUBE     KNEE ARTHROSCOPY Left    LEFT HEART CATHETERIZATION WITH CORONARY ANGIOGRAM N/A 05/07/2011   Procedure: LEFT HEART CATHETERIZATION WITH CORONARY ANGIOGRAM;  Surgeon: Peter M Martinique, MD;  Location: [facility] CATH LAB;  Service:  Cardiovascular;  Laterality: N/A;   PEG TUBE PLACEMENT     RIGHT/LEFT HEART CATH AND CORONARY ANGIOGRAPHY N/A 02/28/2019   Procedure: RIGHT/LEFT HEART CATH AND CORONARY ANGIOGRAPHY;  Surgeon: Burnell Blanks, MD;  Location: [place] CV LAB;  Service: Cardiovascular;  Laterality: N/A;   TRANSCATHETER AORTIC VALVE REPLACEMENT, TRANSFEMORAL  05/02/2019   TRANSCATHETER AORTIC VALVE REPLACEMENT, TRANSFEMORAL N/A 05/02/2019   Procedure: TRANSCATHETER AORTIC VALVE REPLACEMENT, TRANSFEMORAL;  Surgeon: Burnell Blanks, MD;  Location: [place];  Service: Open Heart Surgery;  Laterality: N/A;   VASECTOMY      Prior to Admission medications   Medication Sig Start Date End Date Taking? Authorizing Provider  aspirin 81 MG chewable tablet Chew 1 tablet (81 mg total) by mouth daily. 12/06/18   Deno Etienne, DO  clopidogrel (PLAVIX) 75 MG tablet TAKE ONE TABLET BY MOUTH ONCE DAILY. 12/15/21   Satira Sark, MD  famotidine (PEPCID) 40 MG tablet Take 1 tablet (40 mg total) by mouth at bedtime. 01/28/21   Rogene Houston, MD  levothyroxine (SYNTHROID) 200 MCG tablet Take 200 mcg by mouth daily before breakfast.    [provider]  methocarbamol (ROBAXIN) 500 MG tablet Take 1 tablet (500 mg total) by mouth every 12 (twelve) hours as needed for muscle spasms. 09/15/21   Godfrey Pick, MD    No Known Allergies  Social History   Socioeconomic History   Marital status: Widowed    Spouse name: [name]   Number of children: Not on file   Years of education: Not on file   Highest education level: High school graduate  Occupational History    Comment: retired   Tobacco Use   Smoking status: Never   Smokeless tobacco: Never  Vaping Use   Vaping Use: Never used  Substance and Sexual Activity   Alcohol use: No    Comment: none since Jun 26, 2015   Drug use: No   Sexual activity: Not on file  Other Topics Concern   Not on file  Social History Narrative   Lives with wife   Caffeine-  coffee 2 c daily, usually decaf   Social Determinants of Health   Financial Resource Strain: Not on file  Food Insecurity: Not on file  Transportation Needs: No Transportation Needs (11/27/2021)   PRAPARE - Hydrologist (Medical): No    Lack of Transportation (Non-Medical): No  Physical Activity: Not on file  Stress: Not on file  Social Connections: Not on file  Intimate Partner Violence: Not on file    Tobacco Use: Low Risk  (09/15/2021)   Patient History    Smoking Tobacco Use: Never    Smokeless Tobacco Use: Never    Passive Exposure: Not on file   Social History   Substance and Sexual Activity  Alcohol Use No   Comment: none since Jun 26, 2015    Family History  Problem Relation Age of Onset   Arthritis Mother    Other Brother        staph infection    Review of Systems  Constitutional:  Negative for chills and fever.  HENT: Negative.    Eyes: Negative.  Respiratory:  Negative for cough and shortness of breath.   Cardiovascular:  Negative for chest pain and palpitations.  Gastrointestinal:  Negative for abdominal pain, nausea and vomiting.  Genitourinary:  Negative for dysuria, frequency and urgency.  Musculoskeletal:  Positive for joint pain.  Skin:  Negative for rash.   Objective:  Physical Exam: Well nourished and well developed.  General: Alert and oriented x3, cooperative and pleasant, no acute distress.  Head: normocephalic, atraumatic, neck supple.  Eyes: EOMI.  Abdomen: non-tender to palpation and soft, normoactive bowel sounds. Musculoskeletal:  The patient has an antalgic gait pattern favoring the right side without the use of assistive devices.   Bilateral Hip Exam:  The range of motion: normal without discomfort.   Right Knee Exam:  Significant varus deformity.  No effusion present. No swelling present.  The range of motion is: 10 to 115 to 120 degrees.  Marked crepitus on range of motion of the knee.   Positive medial joint line tenderness.  No lateral joint line tenderness.  The knee is stable.   Left Knee Exam:  No effusion present. No swelling present.  The Range of motion is: 5 to 125 degrees.  Moderate crepitus on range of motion of the knee.  Positive medial joint line tenderness.  No lateral joint line tenderness.  The knee is stable.  Calves soft and nontender. Motor function intact in LE. Strength 5/5 LE bilaterally. Neuro: Distal pulses 2+. Sensation to light touch intact in LE.  Vital signs in last 24 hours: BP: ()/()  Arterial Line BP: ()/()   Imaging Review Plain radiographs demonstrate moderate degenerative joint disease of the left knee. The overall alignment is neutral. The bone quality appears to be adequate for age and reported activity level.  Assessment/Plan:  End stage arthritis, left knee   The patient history, physical examination, clinical judgment of the provider and imaging studies are consistent with end stage degenerative joint disease of the left knee and total knee arthroplasty is deemed medically necessary. The treatment options including medical management, injection therapy arthroscopy and arthroplasty were discussed at length. The risks and benefits of total knee arthroplasty were presented and reviewed. The risks due to aseptic loosening, infection, stiffness, patella tracking problems, thromboembolic complications and other imponderables were discussed. The patient acknowledged the explanation, agreed to proceed with the plan and consent was signed. Patient is being admitted for inpatient treatment for surgery, pain control, PT, OT, prophylactic antibiotics, VTE prophylaxis, progressive ambulation and ADLs and discharge planning. The patient is planning to be discharged home with home health services.  Patient's anticipated LOS is less than 2 midnights, meeting these requirements: - Lives within 1 hour of care - Has a competent adult at home to  recover with post-op - NO history of  - Chronic pain requiring opiods  - Diabetes  - Coronary Artery Disease  - Heart attack  - Cardiac arrhythmia  - Respiratory Failure/COPD  - Renal failure  - Anemia  - Advanced Liver disease  Therapy Plans: HHPT x 2 weeks (no family around other than first few days post-op). Discussed once off pain medication and able to drive, transition to [employer] Reiterated importance of OPPT vs HHPT Disposition: Home with Daughter/Son Planned DVT Prophylaxis: Plavix + Aspirin DME Needed: RW PCP: Tally Joe, MD (requesting cardiac clearance) Cardiologist: Diona Browner, MD (clearance received) TXA: Topical (hx PE) Allergies: NKDA Anesthesia Concerns: None BMI: 32 Last HgbA1c: not diabetic  Pharmacy: [facility]  Other: -Per cardiology, hold Plavix 5  days prior to surgery but he is to continue ASA perioperative -Hx PE 3 years ago, hx of TIA, hx of TAVR  - Patient was instructed on what medications to stop prior to surgery. - Follow-up visit in 2 weeks with Dr. Lequita Halt - Begin physical therapy following surgery - Pre-operative lab work as pre-surgical testing - Prescriptions will be provided in hospital at time of discharge  R. Arcola Jansky, PA-C Orthopedic Surgery [facility] [place]

## 2022-04-30 ENCOUNTER — Encounter: Payer: Self-pay | Admitting: Student

## 2022-04-30 ENCOUNTER — Ambulatory Visit: Payer: Medicare Other | Attending: Physician Assistant | Admitting: Student

## 2022-04-30 VITALS — BP 120/80 | HR 96 | Ht 68.0 in | Wt 216.6 lb

## 2022-04-30 DIAGNOSIS — Z952 Presence of prosthetic heart valve: Secondary | ICD-10-CM | POA: Diagnosis not present

## 2022-04-30 DIAGNOSIS — Z01810 Encounter for preprocedural cardiovascular examination: Secondary | ICD-10-CM

## 2022-04-30 DIAGNOSIS — Z8679 Personal history of other diseases of the circulatory system: Secondary | ICD-10-CM | POA: Diagnosis not present

## 2022-04-30 DIAGNOSIS — I2510 Atherosclerotic heart disease of native coronary artery without angina pectoris: Secondary | ICD-10-CM

## 2022-04-30 NOTE — Patient Instructions (Signed)
Medication Instructions:  Your physician recommends that you continue on your current medications as directed. Please refer to the Current Medication list given to you today.  You may HOLD the Plavix (Clopidogrel) 5 days before surgery   Labwork: None today  Testing/Procedures: None today  Follow-Up: 1 year  Any Other Special Instructions Will Be Listed Below (If Applicable).  If you need a refill on your cardiac medications before your next appointment, please call your pharmacy.

## 2022-04-30 NOTE — Progress Notes (Signed)
Cardiology Office Note    Date:  04/30/2022  ID:  Andrew Watson, DOB 01-21-1941, MRN [id number] Cardiologist: Rozann Lesches, MD    History of Present Illness:    Andrew Watson is a 82 y.o. male with past medical history of CAD (nonobstructive disease by cath in 02/2019), severe AS (s/p TAVR on 05/02/2019), HLD, history of PE, prior TIA and remote history of atrial fibrillation (no longer on anticoagulation) who presents to the office today for annual follow-up and preoperative cardiac clearance evaluation.   He was examined by Dr. [name] in 04/2021 and denied any recent anginal symptoms at that time. A follow-up echocardiogram was recommended for reassessment of his bioprosthetic aortic valve and this showed a preserved EF of 60 to 65% with no wall motion abnormalities.  He did have grade 2 diastolic dysfunction, low normal RV function and his aortic valve was functioning normally with mean gradient of 12 mmHg.  The office was contacted in 02/2022 in regards to cardiac clearance for upcoming right total knee arthroplasty. Follow-up was therefore arranged.  In talking with the patient and his long-term girlfriend today, he reports overall doing well since his last office visit. Enjoys going to the beach frequently. He denies any recent chest pain or dyspnea on exertion. No specific palpitations, orthopnea, PND or pitting edema. While his knee does limit his activity at times, he still push mows the yard and does other routine yard work without any anginal symptoms.  Studies Reviewed:   EKG: EKG is ordered today and demonstrates NSR, HR 88 with no acute ST changes.   R/LHC: 02/2019 Mid LAD lesion is 20% stenosed. 1st Sept lesion is 80% stenosed. 2nd Mrg lesion is 20% stenosed. Hemodynamic findings consistent with aortic valve stenosis.   1. Mild non-obstructive CAD 2. Severe aortic valve stenosis (mean gradient 60.2 mmHg, peak to peak gradient 75 mmHg, AVA 0.55 cm2)    Recommendations: Will continue medical management of mild CAD. Continue with workup for TAVR.   Echocardiogram: 05/2021 IMPRESSIONS     1. Left ventricular ejection fraction, by estimation, is 60 to 65%. The  left ventricle has normal function. The left ventricle has no regional  wall motion abnormalities. There is mild left ventricular hypertrophy.  Left ventricular diastolic parameters  are consistent with Grade II diastolic dysfunction (pseudonormalization).  Elevated left ventricular end-diastolic pressure.   2. Right ventricular systolic function is low normal. The right  ventricular size is mildly enlarged. Tricuspid regurgitation signal is  inadequate for assessing PA pressure.   3. Left atrial size was moderately dilated.   4. The mitral valve is degenerative. Trivial mitral valve regurgitation.   5. The aortic valve has been repaired/replaced. Aortic valve  regurgitation is not visualized. There is a 26 mm Sapien prosthetic (TAVR)  valve present in the aortic position. Aortic valve mean gradient measures  12.0 mmHg. No paravalvular regurgitation.   6. Unable to estimate CVP.   Comparison(s): Prior images reviewed side by side. Normally functioning  TAVR prosthesis, mean gradient mildly increased to 12 mmHg from 7 mmHg.    Physical Exam:   VS:  BP 120/80   Pulse 96   Ht 5\' 8"  (1.727 m)   Wt 216 lb 9.6 oz (98.2 kg)   SpO2 97%   BMI 32.93 kg/m    Wt Readings from Last 3 Encounters:  04/30/22 216 lb 9.6 oz (98.2 kg)  09/15/21 214 lb 8 oz (97.3 kg)  05/14/21 220 lb 12.8 oz (100.2 kg)  GEN: Pleasant, elderly male appearing in no acute distress NECK: No JVD; No carotid bruits CARDIAC: RRR, 2/6 SEM along RUSB.  No rubs or gallops. RESPIRATORY:  Clear to auscultation without rales, wheezing or rhonchi  ABDOMEN: Appears non-distended. No obvious abdominal masses. EXTREMITIES: No clubbing or cyanosis. No pitting edema.  Distal pedal pulses are 2+  bilaterally.   Assessment and Plan:   1.  Preoperative Cardiac Clearance for Knee Replacement - He is able to perform more than 4 METS of activity without anginal symptoms and his RCRI risk is overall low at 0.9% risk of a major cardiac event. He does have a history of TAVR and echocardiogram in 05/2021 showed his valve was functioning normally with a mean gradient of 12 mmHg and no PVR. - He does not require further cardiac testing prior to his upcoming surgery and is of acceptable risk to proceed. He can hold Plavix for 5 days prior to surgery. Would continue ASA in the perioperative setting if able to do so. Will forward today's note to Dr. Anne Fu office.   2. History of TAVR - He underwent TAVR in 04/2019 and echocardiogram last year was reassuring as outlined above. Can plan to obtain repeat imaging next year for surveillance. - He has been continued on ASA 81 mg daily and Plavix 75 mg daily. Will review with Dr. [name] in regards to stopping Plavix as he has no specific cardiac or neurologic indications for this (was initially started on it following TAVR).  Addendum: Reviewed with Dr. [name] and he can stop Plavix 75 mg daily and continue on ASA 81 mg daily.  3. CAD - He had nonobstructive disease by cath in 02/2019. As discussed above, he denies any recent anginal symptoms. Will continue ASA 81 mg daily. He reports not being on statin therapy since his cholesterol has been well-controlled. He did have recent labs with his PCP earlier this year and we will request a copy of these. Goal LDL is less than 70.  4. Remote History of Atrial Fibrillation  - No known recurrence of this and he is no longer on anticoagulation. Denies any recent palpitations.    Signed, Erma Heritage, PA-C

## 2022-05-05 ENCOUNTER — Encounter: Payer: Self-pay | Admitting: Family Medicine

## 2022-05-05 NOTE — Patient Instructions (Signed)
SURGICAL WAITING ROOM VISITATION  Patients having surgery or a procedure may have no more than 2 support people in the waiting area - these visitors may rotate.    Children under the age of 29 must have an adult with them who is not the patient.  Due to an increase in RSV and influenza rates and associated hospitalizations, children ages 89 and under may not visit patients in [facility] hospitals.  If the patient needs to stay at the hospital during part of their recovery, the visitor guidelines for inpatient rooms apply. Pre-op nurse will coordinate an appropriate time for 1 support person to accompany patient in pre-op.  This support person may not rotate.    Please refer to the [facility] website for the visitor guidelines for Inpatients (after your surgery is over and you are in a regular room).    Your procedure is scheduled on: 05/18/22   Report to [facility] Main Entrance    Report to admitting at 10:10 AM   Call this number if you have problems the morning of surgery [phone]   Do not eat food :After Midnight.   After Midnight you may have the following liquids until 9:40 AM DAY OF SURGERY  Water Non-Citrus Juices (without pulp, NO RED-Apple, White grape, White cranberry) Black Coffee (NO MILK/CREAM OR CREAMERS, sugar ok)  Clear Tea (NO MILK/CREAM OR CREAMERS, sugar ok) regular and decaf                             Plain Jell-O (NO RED)                                           Fruit ices (not with fruit pulp, NO RED)                                     Popsicles (NO RED)                                                               Sports drinks like Gatorade (NO RED)    The day of surgery:  Drink ONE (1) Pre-Surgery Clear Ensure at 9:40 AM the morning of surgery. Drink in one sitting. Do not sip.  This drink was given to you during your hospital  pre-op appointment visit. Nothing else to drink after completing the  Pre-Surgery Clear Ensure.           If you have questions, please contact your surgeon's office.   FOLLOW BOWEL PREP AND ANY ADDITIONAL PRE OP INSTRUCTIONS YOU RECEIVED FROM YOUR SURGEON'S OFFICE!!!     Oral Hygiene is also important to reduce your risk of infection.                                    Remember - BRUSH YOUR TEETH THE MORNING OF SURGERY WITH YOUR REGULAR TOOTHPASTE  DENTURES WILL BE REMOVED PRIOR TO SURGERY PLEASE DO NOT APPLY "Poly grip" OR ADHESIVES!!!  Take these medicines the morning of surgery with A SIP OF WATER: Levothyroxine                               You may not have any metal on your body including jewelry, and body piercing             Do not wear lotions, powders, cologne, or deodorant  Do not shave  48 hours prior to surgery.               Men may shave face and neck.   Do not bring valuables to the hospital.  IS NOT             RESPONSIBLE   FOR VALUABLES.   Contacts, glasses, dentures or bridgework may not be worn into surgery.   Bring small overnight bag day of surgery.   DO NOT BRING YOUR HOME MEDICATIONS TO THE HOSPITAL. PHARMACY WILL DISPENSE MEDICATIONS LISTED ON YOUR MEDICATION LIST TO YOU DURING YOUR ADMISSION IN THE HOSPITAL!   Special Instructions: Bring a copy of your healthcare power of attorney and living will documents the day of surgery if you haven't scanned them before.              Please read over the following fact sheets you were given: IF YOU HAVE QUESTIONS ABOUT YOUR PRE-OP INSTRUCTIONS PLEASE CALL [phone] Contras- Katana Berthold    If you received a COVID test during your pre-op visit  it is requested that you wear a mask when out in public, stay away from anyone that may not be feeling well and notify your surgeon if you develop symptoms. If you test positive for Covid or have been in contact with anyone that has tested positive in the last 10 days please notify you surgeon.  FAILURE TO FOLLOW THESE INSTRUCTIONS MAY RESULT IN THE CANCELLATION OF YOUR  SURGERY  PATIENT SIGNATURE_________________________________  NURSE SIGNATURE__________________________________  ________________________________________________________________________  Rogelia MireIncentive Spirometer  An incentive spirometer is a tool that can help keep your lungs clear and active. This tool measures how well you are filling your lungs with each breath. Taking long deep breaths may help reverse or decrease the chance of developing breathing (pulmonary) problems (especially infection) following: A long period of time when you are unable to move or be active. BEFORE THE PROCEDURE  If the spirometer includes an indicator to show your best effort, your nurse or respiratory therapist will set it to a desired goal. If possible, sit up straight or lean slightly forward. Try not to slouch. Hold the incentive spirometer in an upright position. INSTRUCTIONS FOR USE  Sit on the edge of your bed if possible, or sit up as far as you can in bed or on a chair. Hold the incentive spirometer in an upright position. Breathe out normally. Place the mouthpiece in your mouth and seal your lips tightly around it. Breathe in slowly and as deeply as possible, raising the piston or the ball toward the top of the column. Hold your breath for 3-5 seconds or for as long as possible. Allow the piston or ball to fall to the bottom of the column. Remove the mouthpiece from your mouth and breathe out normally. Rest for a few seconds and repeat Steps 1 through 7 at least 10 times every 1-2 hours when you are awake. Take your time and take a few normal breaths between deep breaths. The spirometer may include an  indicator to show your best effort. Use the indicator as a goal to work toward during each repetition. After each set of 10 deep breaths, practice coughing to be sure your lungs are clear. If you have an incision (the cut made at the time of surgery), support your incision when coughing by placing a pillow or  rolled up towels firmly against it. Once you are able to get out of bed, walk around indoors and cough well. You may stop using the incentive spirometer when instructed by your caregiver.  RISKS AND COMPLICATIONS Take your time so you do not get dizzy or light-headed. If you are in pain, you may need to take or ask for pain medication before doing incentive spirometry. It is harder to take a deep breath if you are having pain. AFTER USE Rest and breathe slowly and easily. It can be helpful to keep track of a log of your progress. Your caregiver can provide you with a simple table to help with this. If you are using the spirometer at home, follow these instructions: SEEK MEDICAL CARE IF:  You are having difficultly using the spirometer. You have trouble using the spirometer as often as instructed. Your pain medication is not giving enough relief while using the spirometer. You develop fever of 100.5 F (38.1 C) or higher. SEEK IMMEDIATE MEDICAL CARE IF:  You cough up bloody sputum that had not been present before. You develop fever of 102 F (38.9 C) or greater. You develop worsening pain at or near the incision site. MAKE SURE YOU:  Understand these instructions. Will watch your condition. Will get help right away if you are not doing well or get worse. Document Released: 05/25/2006 Document Revised: 04/06/2011 Document Reviewed: 07/26/2006 [facility] Patient Information 2014 [place], [place].   ________________________________________________________________________

## 2022-05-05 NOTE — Progress Notes (Signed)
COVID Vaccine Completed:  Date of COVID positive in last 90 days:  PCP - Tally Joe, MD Cardiologist - Nona Dell, MD LOV 05/14/21 Gastroenterologist- Lionel December, MD LOV 01/28/21  Cardiac clearance by Randall An 04/30/22 in Epic  Chest x-ray - 09/15/21 Epic EKG - 04/30/22 Epic Stress Test -  ECHO - 06/03/21 Epic Cardiac Cath - 02/28/19 Epic Pacemaker/ICD device last checked: Spinal Cord Stimulator:  Bowel Prep -   Sleep Study -  CPAP -   Fasting Blood Sugar -  Checks Blood Sugar _____ times a day  Last dose of GLP1 agonist-  N/A GLP1 instructions:  N/A   Last dose of SGLT-2 inhibitors-  N/A SGLT-2 instructions: N/A   Blood Thinner Instructions: Plavix, hold 5 days per cards, stopped?? Aspirin Instructions: ASA 81, continue per cards Last Dose:  Activity level:  Can go up a flight of stairs and perform activities of daily living without stopping and without symptoms of chest pain or shortness of breath.  Able to exercise without symptoms  Unable to go up a flight of stairs without symptoms of     Anesthesia review: HCF, Tavr, TIA, Pulmonary embolism, CAD, remote hx of a fib, pancreatitis   Patient denies shortness of breath, fever, cough and chest pain at PAT appointment  Patient verbalized understanding of instructions that were given to them at the PAT appointment. Patient was also instructed that they will need to review over the PAT instructions again at home before surgery.

## 2022-05-07 ENCOUNTER — Other Ambulatory Visit: Payer: Self-pay

## 2022-05-07 ENCOUNTER — Encounter (HOSPITAL_COMMUNITY): Payer: Self-pay

## 2022-05-07 ENCOUNTER — Encounter (HOSPITAL_COMMUNITY)
Admission: RE | Admit: 2022-05-07 | Discharge: 2022-05-07 | Disposition: A | Discharge status: Home / Self Care | Payer: Medicare Other | Source: Ambulatory Visit | Attending: Orthopedic Surgery | Admitting: Orthopedic Surgery

## 2022-05-07 VITALS — BP 118/89 | HR 82 | Temp 97.8°F | Resp 16 | Ht 68.0 in | Wt 215.0 lb

## 2022-05-07 DIAGNOSIS — Z952 Presence of prosthetic heart valve: Secondary | ICD-10-CM | POA: Insufficient documentation

## 2022-05-07 DIAGNOSIS — Z7982 Long term (current) use of aspirin: Secondary | ICD-10-CM | POA: Insufficient documentation

## 2022-05-07 DIAGNOSIS — I509 Heart failure, unspecified: Secondary | ICD-10-CM | POA: Insufficient documentation

## 2022-05-07 DIAGNOSIS — I2510 Atherosclerotic heart disease of native coronary artery without angina pectoris: Secondary | ICD-10-CM | POA: Diagnosis not present

## 2022-05-07 DIAGNOSIS — M1712 Unilateral primary osteoarthritis, left knee: Secondary | ICD-10-CM | POA: Diagnosis not present

## 2022-05-07 DIAGNOSIS — Z01818 Encounter for other preprocedural examination: Secondary | ICD-10-CM

## 2022-05-07 DIAGNOSIS — Z7902 Long term (current) use of antithrombotics/antiplatelets: Secondary | ICD-10-CM | POA: Insufficient documentation

## 2022-05-07 DIAGNOSIS — E039 Hypothyroidism, unspecified: Secondary | ICD-10-CM | POA: Diagnosis not present

## 2022-05-07 DIAGNOSIS — Z01812 Encounter for preprocedural laboratory examination: Secondary | ICD-10-CM | POA: Diagnosis not present

## 2022-05-07 DIAGNOSIS — K219 Gastro-esophageal reflux disease without esophagitis: Secondary | ICD-10-CM | POA: Insufficient documentation

## 2022-05-07 HISTORY — DX: Heart failure, unspecified: I50.9

## 2022-05-07 LAB — CBC
HCT: 41.9 % (ref 39.0–52.0)
Hemoglobin: 13.4 g/dL (ref 13.0–17.0)
MCH: 29.9 pg (ref 26.0–34.0)
MCHC: 32 g/dL (ref 30.0–36.0)
MCV: 93.5 fL (ref 80.0–100.0)
Platelets: 176 10*3/uL (ref 150–400)
RBC: 4.48 MIL/uL (ref 4.22–5.81)
RDW: 13 % (ref 11.5–15.5)
WBC: 8.1 10*3/uL (ref 4.0–10.5)
nRBC: 0 % (ref 0.0–0.2)

## 2022-05-07 LAB — BASIC METABOLIC PANEL
Anion gap: 7 (ref 5–15)
BUN: 18 mg/dL (ref 8–23)
CO2: 27 mmol/L (ref 22–32)
Calcium: 9.2 mg/dL (ref 8.9–10.3)
Chloride: 104 mmol/L (ref 98–111)
Creatinine, Ser: 1.09 mg/dL (ref 0.61–1.24)
GFR, Estimated: >60 mL/min (ref >60)
Glucose, Bld: 99 mg/dL (ref 70–99)
Potassium: 4.4 mmol/L (ref 3.5–5.1)
Sodium: 138 mmol/L (ref 135–145)

## 2022-05-07 LAB — SURGICAL PCR SCREEN
MRSA, PCR: NEGATIVE
Staphylococcus aureus: POSITIVE — AB

## 2022-05-08 NOTE — Progress Notes (Signed)
Anesthesia chart review   Case: [id number] Date/Time: 05/18/22 1223   Procedure: TOTAL KNEE ARTHROPLASTY (Left: Knee)   Anesthesia type: Choice   Pre-op diagnosis: LEFT knee osteoarthritis   [facility] ROOM 10 / [facility]   Surgeons: Ollen Gross, MD       DISCUSSION: 82 year old never smoker with history of GERD, hypothyroidism, CHF, CAD (nonobstructive disease by cath in 02/2019) , s/p TAVR.05/02/2019 , PE, left knee OA scheduled for above procedure 05/18/2022 with Dr. Ollen Gross.   Patient last seen by cardiology 04/30/2022.  Per office visit note, "- He is able to perform more than 4 METS of activity without anginal symptoms and his RCRI risk is overall low at 0.9% risk of a major cardiac event. He does have a history of TAVR and echocardiogram in 05/2021 showed his valve was functioning normally with a mean gradient of 12 mmHg and no PVR. - He does not require further cardiac testing prior to his upcoming surgery and is of acceptable risk to proceed. He can hold Plavix for 5 days prior to surgery. Would continue ASA in the perioperative setting if able to do so. Will forward today's note to Dr. Deri Fuelling office."  Patient advised to stop Plavix at this visit.  Will remain on 81 mg ASA.  Anticipate pt can proceed with planned procedure barring acute status change.   VS: BP 118/89   Pulse 82   Temp 36.6 C (Oral)   Resp 16   Ht  (1.727 m)   Wt 97.5 kg   SpO2 97%   BMI 32.69 kg/m   PROVIDERS: Tally Joe, MD is PCP  Cardiologist - Nona Dell, MD  LABS: Labs reviewed: Acceptable for surgery. (all labs ordered are listed, but only abnormal results are displayed)  Labs Reviewed  SURGICAL PCR SCREEN - Abnormal; Notable for the following components:      Result Value   Staphylococcus aureus POSITIVE (*)    All other components within normal limits  CBC  BASIC METABOLIC PANEL     IMAGES:   EKG:   CV: Echo 06/03/2021 1. Left ventricular ejection fraction, by  estimation, is 60 to 65%. The  left ventricle has normal function. The left ventricle has no regional  wall motion abnormalities. There is mild left ventricular hypertrophy.  Left ventricular diastolic parameters  are consistent with Grade II diastolic dysfunction (pseudonormalization).  Elevated left ventricular end-diastolic pressure.   2. Right ventricular systolic function is low normal. The right  ventricular size is mildly enlarged. Tricuspid regurgitation signal is  inadequate for assessing PA pressure.   3. Left atrial size was moderately dilated.   4. The mitral valve is degenerative. Trivial mitral valve regurgitation.   5. The aortic valve has been repaired/replaced. Aortic valve  regurgitation is not visualized. There is a 26 mm Sapien prosthetic (TAVR)  valve present in the aortic position. Aortic valve mean gradient measures  12.0 mmHg. No paravalvular regurgitation.   6. Unable to estimate CVP.  Past Medical History:  Diagnosis Date   Aortic valve stenosis    Arthritis    CAD (coronary artery disease) 05/07/2011   Mild atherosclerosis by cardiac catheterization February 2021   CHF (congestive heart failure)    GERD (gastroesophageal reflux disease)    Heart murmur    History of atrial fibrillation 2013   Reportedly limited episode   History of kidney stones    History of MRSA infection    History of pancreatitis 2017   [facility]  [place]   History of pulmonary embolism 2017   History of TIA (transient ischemic attack)    November 2020   Hypothyroidism    S/P TAVR (transcatheter aortic valve replacement) 05/02/2019   s/p TAVR with a 26 mm Edwards Sapien 3 Ultra via the TF approach with Dr. Aundra Dubin & Dr. Laneta Simmers     Past Surgical History:  Procedure Laterality Date   BACK SURGERY     CARDIAC CATHETERIZATION     CATARACT EXTRACTION Bilateral    CHOLECYSTECTOMY     COLONOSCOPY  07/08/2011   Procedure: COLONOSCOPY;  Surgeon: Malissa Hippo, MD;  [facility];  Service: Endoscopy;  Laterality: N/A;  930   CYSTOSCOPY WITH RETROGRADE PYELOGRAM, URETEROSCOPY AND STENT PLACEMENT Left 03/30/2019   Procedure: CYSTOSCOPY WITH LEFT  RETROGRADE , URETEROSCOPY HOLMIUM LASER AND STENT PLACEMENT;  Surgeon: Bjorn Pippin, MD;  Location: [facility];  Service: Urology;  Laterality: Left;   DUODENOTOMY  09/29/2016   repair of fistula, jejunal resection   INGUINAL HERNIA REPAIR N/A 03/01/2018   Procedure: LAPAROSCOPIC TEP BILATERAL INGUINAL HERNIA REPAIR;  Surgeon: Luretha Murphy, MD;  Location: [facility];  Service: General;  Laterality: N/A;   INTRAOPERATIVE TRANSTHORACIC ECHOCARDIOGRAM N/A 05/02/2019   Procedure: Intraoperative Transthoracic Echocardiogram;  Surgeon: Kathleene Hazel, MD;  Location: [facility] OR;  Service: Open Heart Surgery;  Laterality: N/A;   JEJUNOSTOMY FEEDING TUBE     KNEE ARTHROSCOPY Left    LEFT HEART CATHETERIZATION WITH CORONARY ANGIOGRAM N/A 05/07/2011   Procedure: LEFT HEART CATHETERIZATION WITH CORONARY ANGIOGRAM;  Surgeon: Peter M Swaziland, MD;  Location: [facility] CATH LAB;  Service: Cardiovascular;  Laterality: N/A;   PEG TUBE PLACEMENT     RIGHT/LEFT HEART CATH AND CORONARY ANGIOGRAPHY N/A 02/28/2019   Procedure: RIGHT/LEFT HEART CATH AND CORONARY ANGIOGRAPHY;  Surgeon: Kathleene Hazel, MD;  Location: [facility] INVASIVE CV LAB;  Service: Cardiovascular;  Laterality: N/A;   TRANSCATHETER AORTIC VALVE REPLACEMENT, TRANSFEMORAL  05/02/2019   TRANSCATHETER AORTIC VALVE REPLACEMENT, TRANSFEMORAL N/A 05/02/2019   Procedure: TRANSCATHETER AORTIC VALVE REPLACEMENT, TRANSFEMORAL;  Surgeon: Kathleene Hazel, MD;  Location: [facility] OR;  Service: Open Heart Surgery;  Laterality: N/A;   VASECTOMY     WISDOM TOOTH EXTRACTION      MEDICATIONS:  aspirin 81 MG chewable tablet   famotidine (PEPCID) 40 MG tablet   levothyroxine (SYNTHROID) 175 MCG tablet   No current facility-administered medications for this encounter.    Jodell Cipro Ward,  PA-C [facility] Pre-Surgical Testing [phone]

## 2022-05-08 NOTE — Anesthesia Preprocedure Evaluation (Addendum)
Anesthesia Evaluation  Patient identified by MRN, date of birth, ID band Patient awake    Reviewed: Allergy & Precautions, NPO status , Patient's Chart, lab work & pertinent test results  History of Anesthesia Complications Negative for: history of anesthetic complications  Airway Mallampati: III  TM Distance: >3 FB Neck ROM: Full    Dental  (+) Dental Advisory Given, Teeth Intact,    Pulmonary neg pulmonary ROS   breath sounds clear to auscultation       Cardiovascular + CAD and +CHF  + Valvular Problems/Murmurs  Rhythm:Regular  1. Left ventricular ejection fraction, by estimation, is 60 to 65%. The  left ventricle has normal function. The left ventricle has no regional  wall motion abnormalities. There is mild left ventricular hypertrophy.  Left ventricular diastolic parameters  are consistent with Grade II diastolic dysfunction (pseudonormalization).  Elevated left ventricular end-diastolic pressure.   2. Right ventricular systolic function is low normal. The right  ventricular size is mildly enlarged. Tricuspid regurgitation signal is  inadequate for assessing PA pressure.   3. Left atrial size was moderately dilated.   4. The mitral valve is degenerative. Trivial mitral valve regurgitation.   5. The aortic valve has been repaired/replaced. Aortic valve  regurgitation is not visualized. There is a 26 mm Sapien prosthetic (TAVR)  valve present in the aortic position. Aortic valve mean gradient measures  12.0 mmHg. No paravalvular regurgitation.   6. Unable to estimate CVP.     Neuro/Psych negative neurological ROS  negative psych ROS   GI/Hepatic Neg liver ROS,GERD  Medicated and Controlled,,  Endo/Other  Hypothyroidism    Renal/GU negative Renal ROS     Musculoskeletal  (+) Arthritis ,    Abdominal   Peds  Hematology  (+) Blood dyscrasia Lab Results      Component                Value               Date                       WBC                      8.1                 05/07/2022                HGB                      13.4                05/07/2022                HCT                      41.9                05/07/2022                MCV                      93.5                05/07/2022                PLT  176                 05/07/2022             PTT > 40 multiple times over the years with no clear explanation   Anesthesia Other Findings S/p TAVR  Reproductive/Obstetrics                             Anesthesia Physical Anesthesia Plan  ASA: 3  Anesthesia Plan: General and Regional   Post-op Pain Management: Regional block* and Ofirmev IV (intra-op)*   Induction: Intravenous  PONV Risk Score and Plan: 2 and Ondansetron, Dexamethasone, TIVA and Propofol infusion  Airway Management Planned: LMA  Additional Equipment: None  Intra-op Plan:   Post-operative Plan: Extubation in OR  Informed Consent: I have reviewed the patients History and Physical, chart, labs and discussed the procedure including the risks, benefits and alternatives for the proposed anesthesia with the patient or authorized representative who has indicated his/her understanding and acceptance.     Dental advisory given  Plan Discussed with: CRNA  Anesthesia Plan Comments: (See PAT note 05/07/2022)       Anesthesia Quick Evaluation

## 2022-05-08 NOTE — Progress Notes (Signed)
[facility] results routed to Dr. Aluisio 

## 2022-05-18 ENCOUNTER — Observation Stay (HOSPITAL_COMMUNITY)
Admission: RE | Admit: 2022-05-18 | Discharge: 2022-05-19 | Disposition: A | Discharge status: Home / Self Care | Payer: Medicare Other | Source: Ambulatory Visit | Attending: Orthopedic Surgery | Admitting: Orthopedic Surgery

## 2022-05-18 ENCOUNTER — Other Ambulatory Visit: Payer: Self-pay

## 2022-05-18 ENCOUNTER — Encounter (HOSPITAL_COMMUNITY): Payer: Self-pay | Admitting: Orthopedic Surgery

## 2022-05-18 ENCOUNTER — Encounter (HOSPITAL_COMMUNITY)
Admission: RE | Disposition: A | Discharge status: Home / Self Care | Payer: Self-pay | Source: Ambulatory Visit | Attending: Orthopedic Surgery

## 2022-05-18 ENCOUNTER — Ambulatory Visit (HOSPITAL_COMMUNITY): Payer: Medicare Other | Admitting: Physician Assistant

## 2022-05-18 ENCOUNTER — Ambulatory Visit (HOSPITAL_BASED_OUTPATIENT_CLINIC_OR_DEPARTMENT_OTHER): Payer: Medicare Other | Admitting: Anesthesiology

## 2022-05-18 DIAGNOSIS — I509 Heart failure, unspecified: Secondary | ICD-10-CM | POA: Diagnosis not present

## 2022-05-18 DIAGNOSIS — Z86711 Personal history of pulmonary embolism: Secondary | ICD-10-CM | POA: Insufficient documentation

## 2022-05-18 DIAGNOSIS — Z8673 Personal history of transient ischemic attack (TIA), and cerebral infarction without residual deficits: Secondary | ICD-10-CM | POA: Diagnosis not present

## 2022-05-18 DIAGNOSIS — Z79899 Other long term (current) drug therapy: Secondary | ICD-10-CM | POA: Insufficient documentation

## 2022-05-18 DIAGNOSIS — I2510 Atherosclerotic heart disease of native coronary artery without angina pectoris: Secondary | ICD-10-CM | POA: Diagnosis not present

## 2022-05-18 DIAGNOSIS — M1712 Unilateral primary osteoarthritis, left knee: Secondary | ICD-10-CM

## 2022-05-18 DIAGNOSIS — Z7982 Long term (current) use of aspirin: Secondary | ICD-10-CM | POA: Diagnosis not present

## 2022-05-18 DIAGNOSIS — E039 Hypothyroidism, unspecified: Secondary | ICD-10-CM | POA: Insufficient documentation

## 2022-05-18 DIAGNOSIS — I5033 Acute on chronic diastolic (congestive) heart failure: Secondary | ICD-10-CM | POA: Insufficient documentation

## 2022-05-18 DIAGNOSIS — Z7902 Long term (current) use of antithrombotics/antiplatelets: Secondary | ICD-10-CM | POA: Insufficient documentation

## 2022-05-18 DIAGNOSIS — I4891 Unspecified atrial fibrillation: Secondary | ICD-10-CM | POA: Insufficient documentation

## 2022-05-18 DIAGNOSIS — G8918 Other acute postprocedural pain: Secondary | ICD-10-CM | POA: Diagnosis not present

## 2022-05-18 DIAGNOSIS — Z952 Presence of prosthetic heart valve: Secondary | ICD-10-CM | POA: Diagnosis not present

## 2022-05-18 DIAGNOSIS — M179 Osteoarthritis of knee, unspecified: Secondary | ICD-10-CM | POA: Diagnosis present

## 2022-05-18 HISTORY — PX: TOTAL KNEE ARTHROPLASTY: SHX125

## 2022-05-18 SURGERY — ARTHROPLASTY, KNEE, TOTAL
Anesthesia: Regional | Site: Knee | Laterality: Left

## 2022-05-18 MED ORDER — CEFAZOLIN SODIUM-DEXTROSE 2-4 GM/100ML-% IV SOLN
2.0000 g | INTRAVENOUS | Status: AC
  Administered 2022-05-18: 2 g via INTRAVENOUS
  Filled 2022-05-18: qty 100

## 2022-05-18 MED ORDER — LACTATED RINGERS IV SOLN: INTRAVENOUS | Status: DC

## 2022-05-18 MED ORDER — EPHEDRINE 5 MG/ML INJ
INTRAVENOUS | Status: AC
  Filled 2022-05-18: qty 5

## 2022-05-18 MED ORDER — ACETAMINOPHEN 10 MG/ML IV SOLN: 1000.0000 mg | Freq: Once | INTRAVENOUS | Status: DC | PRN

## 2022-05-18 MED ORDER — LEVOTHYROXINE SODIUM 50 MCG PO TABS
175.0000 ug | ORAL_TABLET | Freq: Every day | ORAL | Status: DC
  Administered 2022-05-19: 175 ug via ORAL
  Filled 2022-05-18: qty 1

## 2022-05-18 MED ORDER — ASPIRIN 325 MG PO TBEC: 325.0000 mg | DELAYED_RELEASE_TABLET | Freq: Every day | ORAL | Status: DC

## 2022-05-18 MED ORDER — DIPHENHYDRAMINE HCL 12.5 MG/5ML PO ELIX: 12.5000 mg | ORAL_SOLUTION | ORAL | Status: DC | PRN

## 2022-05-18 MED ORDER — DEXAMETHASONE SODIUM PHOSPHATE 10 MG/ML IJ SOLN
10.0000 mg | Freq: Once | INTRAMUSCULAR | Status: AC
  Administered 2022-05-19: 10 mg via INTRAVENOUS
  Filled 2022-05-18: qty 1

## 2022-05-18 MED ORDER — FLEET ENEMA 7-19 GM/118ML RE ENEM: 1.0000 | ENEMA | Freq: Once | RECTAL | Status: DC | PRN

## 2022-05-18 MED ORDER — METHOCARBAMOL 1000 MG/10ML IJ SOLN: 500.0000 mg | Freq: Four times a day (QID) | INTRAVENOUS | Status: DC | PRN

## 2022-05-18 MED ORDER — CHLORHEXIDINE GLUCONATE 0.12 % MT SOLN: 15.0000 mL | Freq: Once | OROMUCOSAL | Status: DC

## 2022-05-18 MED ORDER — BISACODYL 10 MG RE SUPP: 10.0000 mg | Freq: Every day | RECTAL | Status: DC | PRN

## 2022-05-18 MED ORDER — OXYCODONE HCL 5 MG PO TABS: 5.0000 mg | ORAL_TABLET | Freq: Once | ORAL | Status: DC | PRN

## 2022-05-18 MED ORDER — DOCUSATE SODIUM 100 MG PO CAPS
100.0000 mg | ORAL_CAPSULE | Freq: Two times a day (BID) | ORAL | Status: DC
  Administered 2022-05-18 – 2022-05-19 (×2): 100 mg via ORAL
  Filled 2022-05-18 (×2): qty 1

## 2022-05-18 MED ORDER — ACETAMINOPHEN 160 MG/5ML PO SOLN: 1000.0000 mg | Freq: Once | ORAL | Status: DC | PRN

## 2022-05-18 MED ORDER — PHENOL 1.4 % MT LIQD: 1.0000 | OROMUCOSAL | Status: DC | PRN

## 2022-05-18 MED ORDER — SODIUM CHLORIDE 0.9 % IV SOLN: INTRAVENOUS | Status: DC

## 2022-05-18 MED ORDER — SODIUM CHLORIDE 0.9 % IR SOLN
Status: DC | PRN
  Administered 2022-05-18: 1000 mL

## 2022-05-18 MED ORDER — DEXAMETHASONE SODIUM PHOSPHATE 10 MG/ML IJ SOLN
8.0000 mg | Freq: Once | INTRAMUSCULAR | Status: AC
  Administered 2022-05-18: 8 mg via INTRAVENOUS

## 2022-05-18 MED ORDER — FENTANYL CITRATE PF 50 MCG/ML IJ SOSY
PREFILLED_SYRINGE | INTRAMUSCULAR | Status: AC
  Filled 2022-05-18: qty 1

## 2022-05-18 MED ORDER — SODIUM CHLORIDE (PF) 0.9 % IJ SOLN
INTRAMUSCULAR | Status: AC
  Filled 2022-05-18: qty 50

## 2022-05-18 MED ORDER — LIDOCAINE 2% (20 MG/ML) 5 ML SYRINGE
INTRAMUSCULAR | Status: DC | PRN
  Administered 2022-05-18: 1000 mg via INTRAVENOUS

## 2022-05-18 MED ORDER — FENTANYL CITRATE PF 50 MCG/ML IJ SOSY
25.0000 ug | PREFILLED_SYRINGE | INTRAMUSCULAR | Status: DC | PRN
  Administered 2022-05-18 (×3): 50 ug via INTRAVENOUS

## 2022-05-18 MED ORDER — CLOPIDOGREL BISULFATE 75 MG PO TABS: 75.0000 mg | ORAL_TABLET | Freq: Every day | ORAL | Status: DC

## 2022-05-18 MED ORDER — OXYCODONE HCL 5 MG PO TABS
5.0000 mg | ORAL_TABLET | ORAL | Status: DC | PRN
  Administered 2022-05-18 – 2022-05-19 (×2): 10 mg via ORAL
  Filled 2022-05-18 (×2): qty 2

## 2022-05-18 MED ORDER — BUPIVACAINE HCL (PF) 0.5 % IJ SOLN
INTRAMUSCULAR | Status: DC | PRN
  Administered 2022-05-18: 20 mL via PERINEURAL

## 2022-05-18 MED ORDER — ONDANSETRON HCL 4 MG PO TABS: 4.0000 mg | ORAL_TABLET | Freq: Four times a day (QID) | ORAL | Status: DC | PRN

## 2022-05-18 MED ORDER — DEXAMETHASONE SODIUM PHOSPHATE 10 MG/ML IJ SOLN
INTRAMUSCULAR | Status: AC
  Filled 2022-05-18: qty 1

## 2022-05-18 MED ORDER — METHOCARBAMOL 500 MG PO TABS
500.0000 mg | ORAL_TABLET | Freq: Four times a day (QID) | ORAL | Status: DC | PRN
  Administered 2022-05-19: 500 mg via ORAL
  Filled 2022-05-18: qty 1

## 2022-05-18 MED ORDER — SODIUM CHLORIDE 0.9 % IV SOLN
INTRAVENOUS | Status: DC | PRN
  Administered 2022-05-18: 60 mL

## 2022-05-18 MED ORDER — ONDANSETRON HCL 4 MG/2ML IJ SOLN
INTRAMUSCULAR | Status: AC
  Filled 2022-05-18: qty 2

## 2022-05-18 MED ORDER — OXYCODONE HCL 5 MG/5ML PO SOLN: 5.0000 mg | Freq: Once | ORAL | Status: DC | PRN

## 2022-05-18 MED ORDER — METOCLOPRAMIDE HCL 5 MG PO TABS: 5.0000 mg | ORAL_TABLET | Freq: Three times a day (TID) | ORAL | Status: DC | PRN

## 2022-05-18 MED ORDER — ORAL CARE MOUTH RINSE: 15.0000 mL | Freq: Once | OROMUCOSAL | Status: DC

## 2022-05-18 MED ORDER — BUPIVACAINE LIPOSOME 1.3 % IJ SUSP
INTRAMUSCULAR | Status: AC
  Filled 2022-05-18: qty 20

## 2022-05-18 MED ORDER — PHENYLEPHRINE HCL-NACL 20-0.9 MG/250ML-% IV SOLN
INTRAVENOUS | Status: DC | PRN
  Administered 2022-05-18: 35 ug/min via INTRAVENOUS

## 2022-05-18 MED ORDER — FENTANYL CITRATE (PF) 100 MCG/2ML IJ SOLN
INTRAMUSCULAR | Status: AC
  Filled 2022-05-18: qty 2

## 2022-05-18 MED ORDER — PROPOFOL 1000 MG/100ML IV EMUL
INTRAVENOUS | Status: AC
  Filled 2022-05-18: qty 100

## 2022-05-18 MED ORDER — MENTHOL 3 MG MT LOZG: 1.0000 | LOZENGE | OROMUCOSAL | Status: DC | PRN

## 2022-05-18 MED ORDER — TRANEXAMIC ACID 1000 MG/10ML IV SOLN
2000.0000 mg | Freq: Once | INTRAVENOUS | Status: DC
  Filled 2022-05-18: qty 20

## 2022-05-18 MED ORDER — POLYETHYLENE GLYCOL 3350 17 G PO PACK: 17.0000 g | PACK | Freq: Every day | ORAL | Status: DC | PRN

## 2022-05-18 MED ORDER — ACETAMINOPHEN 500 MG PO TABS: 1000.0000 mg | ORAL_TABLET | Freq: Once | ORAL | Status: DC | PRN

## 2022-05-18 MED ORDER — FENTANYL CITRATE PF 50 MCG/ML IJ SOSY
50.0000 ug | PREFILLED_SYRINGE | Freq: Once | INTRAMUSCULAR | Status: DC
  Filled 2022-05-18: qty 1

## 2022-05-18 MED ORDER — METOCLOPRAMIDE HCL 5 MG/ML IJ SOLN: 5.0000 mg | Freq: Three times a day (TID) | INTRAMUSCULAR | Status: DC | PRN

## 2022-05-18 MED ORDER — TRAMADOL HCL 50 MG PO TABS
50.0000 mg | ORAL_TABLET | Freq: Four times a day (QID) | ORAL | Status: DC | PRN
  Administered 2022-05-18: 50 mg via ORAL
  Filled 2022-05-18: qty 1

## 2022-05-18 MED ORDER — SODIUM CHLORIDE (PF) 0.9 % IJ SOLN
INTRAMUSCULAR | Status: AC
  Filled 2022-05-18: qty 10

## 2022-05-18 MED ORDER — ONDANSETRON HCL 4 MG/2ML IJ SOLN
INTRAMUSCULAR | Status: DC | PRN
  Administered 2022-05-18: 4 mg via INTRAVENOUS

## 2022-05-18 MED ORDER — POVIDONE-IODINE 10 % EX SWAB: 2.0000 | Freq: Once | CUTANEOUS | Status: DC

## 2022-05-18 MED ORDER — FENTANYL CITRATE (PF) 250 MCG/5ML IJ SOLN
INTRAMUSCULAR | Status: DC | PRN
  Administered 2022-05-18: 50 ug via INTRAVENOUS
  Administered 2022-05-18: 25 ug via INTRAVENOUS
  Administered 2022-05-18: 50 ug via INTRAVENOUS
  Administered 2022-05-18: 25 ug via INTRAVENOUS

## 2022-05-18 MED ORDER — BUPIVACAINE LIPOSOME 1.3 % IJ SUSP: 20.0000 mL | Freq: Once | INTRAMUSCULAR | Status: DC

## 2022-05-18 MED ORDER — CEFAZOLIN SODIUM-DEXTROSE 2-4 GM/100ML-% IV SOLN
2.0000 g | Freq: Four times a day (QID) | INTRAVENOUS | Status: AC
  Administered 2022-05-18 – 2022-05-19 (×2): 2 g via INTRAVENOUS
  Filled 2022-05-18 (×2): qty 100

## 2022-05-18 MED ORDER — LIDOCAINE HCL (PF) 2 % IJ SOLN
INTRAMUSCULAR | Status: AC
  Filled 2022-05-18: qty 5

## 2022-05-18 MED ORDER — PROPOFOL 10 MG/ML IV BOLUS
INTRAVENOUS | Status: AC
  Filled 2022-05-18: qty 20

## 2022-05-18 MED ORDER — HYDROMORPHONE HCL 1 MG/ML IJ SOLN
0.5000 mg | INTRAMUSCULAR | Status: DC | PRN
  Administered 2022-05-19: 1 mg via INTRAVENOUS
  Filled 2022-05-18: qty 1

## 2022-05-18 MED ORDER — PROPOFOL 10 MG/ML IV BOLUS
INTRAVENOUS | Status: DC | PRN
  Administered 2022-05-18: 100 ug/kg/min via INTRAVENOUS

## 2022-05-18 MED ORDER — ACETAMINOPHEN 500 MG PO TABS
1000.0000 mg | ORAL_TABLET | Freq: Four times a day (QID) | ORAL | Status: DC
  Administered 2022-05-18 – 2022-05-19 (×2): 1000 mg via ORAL
  Filled 2022-05-18 (×2): qty 2

## 2022-05-18 MED ORDER — ACETAMINOPHEN 10 MG/ML IV SOLN
1000.0000 mg | Freq: Four times a day (QID) | INTRAVENOUS | Status: DC
  Administered 2022-05-18: 1000 mg via INTRAVENOUS
  Filled 2022-05-18: qty 100

## 2022-05-18 MED ORDER — ONDANSETRON HCL 4 MG/2ML IJ SOLN: 4.0000 mg | Freq: Four times a day (QID) | INTRAMUSCULAR | Status: DC | PRN

## 2022-05-18 MED ORDER — EPHEDRINE SULFATE-NACL 50-0.9 MG/10ML-% IV SOSY
PREFILLED_SYRINGE | INTRAVENOUS | Status: DC | PRN
  Administered 2022-05-18: 5 mg via INTRAVENOUS

## 2022-05-18 SURGICAL SUPPLY — 60 items
ATTUNE MED DOME PAT 41 KNEE (Knees) IMPLANT
ATTUNE PS FEM LT SZ 8 CEM KNEE (Femur) IMPLANT
ATTUNE PSRP INSR SZ8 10 KNEE (Insert) IMPLANT
BAG COUNTER SPONGE SURGICOUNT (BAG) IMPLANT
BAG SPEC THK2 15X12 ZIP CLS (MISCELLANEOUS) ×1
BAG SPNG CNTER NS LX DISP (BAG)
BAG ZIPLOCK 12X15 (MISCELLANEOUS) ×1 IMPLANT
BASE TIBIAL ROT PLAT SZ 8 KNEE (Knees) IMPLANT
BLADE SAG 18X100X1.27 (BLADE) ×1 IMPLANT
BLADE SAW SGTL 11.0X1.19X90.0M (BLADE) ×1 IMPLANT
BNDG CMPR 5X62 HK CLSR LF (GAUZE/BANDAGES/DRESSINGS) ×1
BNDG CMPR MED 10X6 ELC LF (GAUZE/BANDAGES/DRESSINGS) ×1
BNDG ELASTIC 6INX 5YD STR LF (GAUZE/BANDAGES/DRESSINGS) ×1 IMPLANT
BNDG ELASTIC 6X10 VLCR STRL LF (GAUZE/BANDAGES/DRESSINGS) IMPLANT
BOWL SMART MIX CTS (DISPOSABLE) ×1 IMPLANT
BSPLAT TIB 8 CMNT ROT PLAT STR (Knees) ×1 IMPLANT
CEMENT HV SMART SET (Cement) ×2 IMPLANT
COVER SURGICAL LIGHT HANDLE (MISCELLANEOUS) ×1 IMPLANT
CUFF TOURN SGL QUICK 34 (TOURNIQUET CUFF) ×1
CUFF TRNQT CYL 34X4.125X (TOURNIQUET CUFF) ×1 IMPLANT
DRAPE INCISE IOBAN 66X45 STRL (DRAPES) ×1 IMPLANT
DRAPE U-SHAPE 47X51 STRL (DRAPES) ×1 IMPLANT
DRSG AQUACEL AG ADV 3.5X10 (GAUZE/BANDAGES/DRESSINGS) ×1 IMPLANT
DURAPREP 26ML APPLICATOR (WOUND CARE) ×1 IMPLANT
ELECT REM PT RETURN 15FT ADLT (MISCELLANEOUS) ×1 IMPLANT
GLOVE BIO SURGEON STRL SZ 6.5 (GLOVE) IMPLANT
GLOVE BIO SURGEON STRL SZ7.5 (GLOVE) IMPLANT
GLOVE BIO SURGEON STRL SZ8 (GLOVE) ×1 IMPLANT
GLOVE BIOGEL PI IND STRL 6.5 (GLOVE) IMPLANT
GLOVE BIOGEL PI IND STRL 7.0 (GLOVE) IMPLANT
GLOVE BIOGEL PI IND STRL 8 (GLOVE) ×1 IMPLANT
GOWN STRL REUS W/ TWL LRG LVL3 (GOWN DISPOSABLE) ×1 IMPLANT
GOWN STRL REUS W/ TWL XL LVL3 (GOWN DISPOSABLE) IMPLANT
GOWN STRL REUS W/TWL LRG LVL3 (GOWN DISPOSABLE) ×1
GOWN STRL REUS W/TWL XL LVL3 (GOWN DISPOSABLE)
HANDPIECE INTERPULSE COAX TIP (DISPOSABLE) ×1
HOLDER FOLEY CATH W/STRAP (MISCELLANEOUS) IMPLANT
IMMOBILIZER KNEE 20 (SOFTGOODS) ×1
IMMOBILIZER KNEE 20 THIGH 36 (SOFTGOODS) ×1 IMPLANT
KIT TURNOVER KIT A (KITS) IMPLANT
MANIFOLD NEPTUNE II (INSTRUMENTS) ×1 IMPLANT
NS IRRIG 1000ML POUR BTL (IV SOLUTION) ×1 IMPLANT
PACK TOTAL KNEE CUSTOM (KITS) ×1 IMPLANT
PADDING CAST ABS COTTON 6X4 NS (CAST SUPPLIES) IMPLANT
PADDING CAST COTTON 6X4 STRL (CAST SUPPLIES) ×2 IMPLANT
PIN STEINMAN FIXATION KNEE (PIN) IMPLANT
PROTECTOR NERVE ULNAR (MISCELLANEOUS) ×1 IMPLANT
SET HNDPC FAN SPRY TIP SCT (DISPOSABLE) ×1 IMPLANT
SPIKE FLUID TRANSFER (MISCELLANEOUS) ×1 IMPLANT
STRIP CLOSURE SKIN 1/2X4 (GAUZE/BANDAGES/DRESSINGS) ×2 IMPLANT
SUT MNCRL AB 4-0 PS2 18 (SUTURE) ×1 IMPLANT
SUT STRATAFIX 0 PDS 27 VIOLET (SUTURE) ×1
SUT VIC AB 2-0 CT1 27 (SUTURE) ×3
SUT VIC AB 2-0 CT1 TAPERPNT 27 (SUTURE) ×3 IMPLANT
SUTURE STRATFX 0 PDS 27 VIOLET (SUTURE) ×1 IMPLANT
TIBIAL BASE ROT PLAT SZ 8 KNEE (Knees) ×1 IMPLANT
TRAY FOLEY MTR SLVR 16FR STAT (SET/KITS/TRAYS/PACK) ×1 IMPLANT
TUBE SUCTION HIGH CAP CLEAR NV (SUCTIONS) ×1 IMPLANT
WATER STERILE IRR 1000ML POUR (IV SOLUTION) ×2 IMPLANT
WRAP KNEE MAXI GEL POST OP (GAUZE/BANDAGES/DRESSINGS) ×1 IMPLANT

## 2022-05-18 NOTE — Interval H&P Note (Signed)
History and Physical Interval Note:  05/18/2022 10:33 AM  Andrew Watson  has presented today for surgery, with the diagnosis of LEFT knee osteoarthritis.  The various methods of treatment have been discussed with the patient and family. After consideration of risks, benefits and other options for treatment, the patient has consented to  Procedure(s): TOTAL KNEE ARTHROPLASTY (Left) as a surgical intervention.  The patient's history has been reviewed, patient examined, no change in status, stable for surgery.  I have reviewed the patient's chart and labs.  Questions were answered to the patient's satisfaction.     Homero Fellers Nekia Maxham

## 2022-05-18 NOTE — Op Note (Signed)
OPERATIVE REPORT-TOTAL KNEE ARTHROPLASTY   Pre-operative diagnosis- Osteoarthritis  Left knee(s)  Post-operative diagnosis- Osteoarthritis Left knee(s)  Procedure-  Left  Total Knee Arthroplasty  Surgeon- Gus Rankin. Braeden Dolinski, MD  Assistant- Arther Abbott, PA-C   Anesthesia-  GA combined with regional for post-op pain  EBL- 25 ml   Drains None  Tourniquet time-  Total Tourniquet Time Documented: Thigh (Left) - 41 minutes Total: Thigh (Left) - 41 minutes     Complications- None  Condition-PACU - hemodynamically stable.   Brief Clinical Note   Andrew Watson is a 82 y.o. year old male with end stage OA of his left knee with progressively worsening pain and dysfunction. He has constant pain, with activity and at rest and significant functional deficits with difficulties even with ADLs. He has had extensive non-op management including analgesics, injections of cortisone and viscosupplements, and home exercise program, but remains in significant pain with significant dysfunction. Radiographs show bone on bone arthritis medial and patellofemoral. He presents now for left Total Knee Arthroplasty.     Procedure in detail---   The patient is brought into the operating room and positioned supine on the operating table. After successful administration of  GA combined with regional for post-op pain,   a tourniquet is placed high on the  Left thigh(s) and the lower extremity is prepped and draped in the usual sterile fashion. Time out is performed by the operating team and then the  Left lower extremity is wrapped in Esmarch, knee flexed and the tourniquet inflated to 300 mmHg.       A midline incision is made with a ten blade through the subcutaneous tissue to the level of the extensor mechanism. A fresh blade is used to make a medial parapatellar arthrotomy. Soft tissue over the proximal medial tibia is subperiosteally elevated to the joint line with a knife and into the semimembranosus  bursa with a Cobb elevator. Soft tissue over the proximal lateral tibia is elevated with attention being paid to avoiding the patellar tendon on the tibial tubercle. The patella is everted, knee flexed 90 degrees and the ACL and PCL are removed. Findings are bone on bone medial and patellofemoral with large global osteophytes        The drill is used to create a starting hole in the distal femur and the canal is thoroughly irrigated with sterile saline to remove the fatty contents. The 5 degree Left  valgus alignment guide is placed into the femoral canal and the distal femoral cutting block is pinned to remove 9 mm off the distal femur. Resection is made with an oscillating saw.      The tibia is subluxed forward and the menisci are removed. The extramedullary alignment guide is placed referencing proximally at the medial aspect of the tibial tubercle and distally along the second metatarsal axis and tibial crest. The block is pinned to remove 2mm off the more deficient medial  side. Resection is made with an oscillating saw. Size 8is the most appropriate size for the tibia and the proximal tibia is prepared with the modular drill and keel punch for that size.      The femoral sizing guide is placed and size 8 is most appropriate. Rotation is marked off the epicondylar axis and confirmed by creating a rectangular flexion gap at 90 degrees. The size 8 cutting block is pinned in this rotation and the anterior, posterior and chamfer cuts are made with the oscillating saw. The intercondylar block is then  placed and that cut is made.      Trial size 8 tibial component, trial size 8 posterior stabilized femur and a 10  mm posterior stabilized rotating platform insert trial is placed. Full extension is achieved with excellent varus/valgus and anterior/posterior balance throughout full range of motion. The patella is everted and thickness measured to be 27  mm. Free hand resection is taken to 15 mm, a 41 template is  placed, lug holes are drilled, trial patella is placed, and it tracks normally. Osteophytes are removed off the posterior femur with the trial in place. All trials are removed and the cut bone surfaces prepared with pulsatile lavage. Cement is mixed and once ready for implantation, the size 8 tibial implant, size  8 posterior stabilized femoral component, and the size 41 patella are cemented in place and the patella is held with the clamp. The trial insert is placed and the knee held in full extension. The Exparel (20 ml mixed with 60 ml saline) is injected into the extensor mechanism, posterior capsule, medial and lateral gutters and subcutaneous tissues.  All extruded cement is removed and once the cement is hard the permanent 10 mm posterior stabilized rotating platform insert is placed into the tibial tray.      The wound is copiously irrigated with saline solution and the extensor mechanism closed with # 0 Stratofix suture. The tourniquet is released for a total tourniquet time of 41  minutes. Flexion against gravity is 140 degrees and the patella tracks normally. Subcutaneous tissue is closed with 2.0 vicryl and subcuticular with running 4.0 Monocryl. The incision is cleaned and dried and steri-strips and a bulky sterile dressing are applied. The limb is placed into a knee immobilizer and the patient is awakened and transported to recovery in stable condition.      Please note that a surgical assistant was a medical necessity for this procedure in order to perform it in a safe and expeditious manner. Surgical assistant was necessary to retract the ligaments and vital neurovascular structures to prevent injury to them and also necessary for proper positioning of the limb to allow for anatomic placement of the prosthesis.   Gus Rankin Nikeisha Klutz, MD    05/18/2022, 1:32 PM

## 2022-05-18 NOTE — Anesthesia Procedure Notes (Signed)
Anesthesia Regional Block: Adductor canal block   Pre-Anesthetic Checklist: , timeout performed,  Correct Patient, Correct Site, Correct Laterality,  Correct Procedure, Correct Position, site marked,  Risks and benefits discussed,  Surgical consent,  Pre-op evaluation,  At surgeon's request and post-op pain management  Laterality: Right and Lower  Prep: chloraprep       Needles:  Injection technique: Single-shot      Needle Length: 9cm  Needle Gauge: 22     Additional Needles: Arrow StimuQuik ECHO Echogenic Stimulating PNB Needle  Procedures:,,,, ultrasound used (permanent image in chart),,    Narrative:  Start time: 05/18/2022 11:58 AM End time: 05/18/2022 12:03 PM Injection made incrementally with aspirations every 5 mL.  Performed by: Personally  Anesthesiologist: Val Eagle, MD

## 2022-05-18 NOTE — Transfer of Care (Signed)
Immediate Anesthesia Transfer of Care Note  Patient: Andrew Watson  Procedure(s) Performed: TOTAL KNEE ARTHROPLASTY (Left: Knee)  Patient Location: PACU  Anesthesia Type:General  Level of Consciousness: Drowsy, sedated  Airway & Oxygen Therapy: Patient Spontanous Breathing and Patient connected to face mask oxygen  Post-op Assessment: Report given to RN and Post -op Vital signs reviewed and stable  Post vital signs: Reviewed and stable  Last Vitals:  Vitals Value Taken Time  BP 121/75 05/18/22 1400  Temp 36.5 C 05/18/22 1400  Pulse 72 05/18/22 1402  Resp 9 05/18/22 1402  SpO2 100 % 05/18/22 1402  Vitals shown include unvalidated device data.  Last Pain:  Vitals:   05/18/22 1103  TempSrc:   PainSc: 0-No pain         Complications: No notable events documented.

## 2022-05-18 NOTE — Anesthesia Postprocedure Evaluation (Addendum)
Anesthesia Post Note  Patient: Andrew Watson  Procedure(s) Performed: TOTAL KNEE ARTHROPLASTY (Left: Knee)     Patient location during evaluation: PACU Anesthesia Type: General and Regional Level of consciousness: awake and alert Pain management: pain level controlled Vital Signs Assessment: post-procedure vital signs reviewed and stable Respiratory status: spontaneous breathing, nonlabored ventilation and respiratory function stable Cardiovascular status: blood pressure returned to baseline and stable Postop Assessment: no apparent nausea or vomiting Anesthetic complications: no   No notable events documented.  Last Vitals:  Vitals:   05/18/22 1500 05/18/22 1528  BP: 136/66 118/77  Pulse: 70 70  Resp: 18 18  Temp: 36.6 C 36.4 C  SpO2: 100% 100%    Last Pain:  Vitals:   05/18/22 1528  TempSrc:   PainSc: 0-No pain                 Whitni Pasquini

## 2022-05-18 NOTE — Discharge Instructions (Signed)
Ollen Gross, MD Total Joint Specialist [facility] [place] [address], [address] [place], [place] [address] [phone]  TOTAL KNEE REPLACEMENT POSTOPERATIVE DIRECTIONS    Knee Rehabilitation, Guidelines Following Surgery  Results after knee surgery are often greatly improved when you follow the exercise, range of motion and muscle strengthening exercises prescribed by your doctor. Safety measures are also important to protect the knee from further injury. If any of these exercises cause you to have increased pain or swelling in your knee joint, decrease the amount until you are comfortable again and slowly increase them. If you have problems or questions, call your caregiver or physical therapist for advice.   BLOOD CLOT PREVENTION Take an 81 mg Aspirin two times a day for three weeks following surgery. Then resume one 81 mg Aspirin once a day. You may resume your vitamins/supplements upon discharge from the hospital. Do not take any NSAIDs (Advil, Aleve, Ibuprofen, Meloxicam, etc.) until you are 3 weeks out from surgery   HOME CARE INSTRUCTIONS  Remove items at home which could result in a fall. This includes throw rugs or furniture in walking pathways.  ICE to the affected knee as much as tolerated. Icing helps control swelling. If the swelling is well controlled you will be more comfortable and rehab easier. Continue to use ice on the knee for pain and swelling from surgery. You may notice swelling that will progress down to the foot and ankle. This is normal after surgery. Elevate the leg when you are not up walking on it.    Continue to use the breathing machine which will help keep your temperature down. It is common for your temperature to cycle up and down following surgery, especially at night when you are not up moving around and exerting yourself. The breathing machine keeps your lungs expanded and your temperature down. Do not place pillow under the operative  knee, focus on keeping the knee straight while resting  DIET You may resume your previous home diet once you are discharged from the hospital.  DRESSING / WOUND CARE / SHOWERING Keep your bulky bandage on for 2 days. On the third post-operative day you may remove the Ace bandage and gauze. There is a waterproof adhesive bandage on your skin which will stay in place until your first follow-up appointment. Once you remove this you will not need to place another bandage You may begin showering 3 days following surgery, but do not submerge the incision under water.  ACTIVITY For the first 5 days, the key is rest and control of pain and swelling Do your home exercises twice a day starting on post-operative day 3. On the days you go to physical therapy, just do the home exercises once that day. You should rest, ice and elevate the leg for 50 minutes out of every hour. Get up and walk/stretch for 10 minutes per hour. After 5 days you can increase your activity slowly as tolerated. Walk with your walker as instructed. Use the walker until you are comfortable transitioning to a cane. Walk with the cane in the opposite hand of the operative leg. You may discontinue the cane once you are comfortable and walking steadily. Avoid periods of inactivity such as sitting longer than an hour when not asleep. This helps prevent blood clots.  You may discontinue the knee immobilizer once you are able to perform a straight leg raise while lying down. You may resume a sexual relationship in one month or when given the [facility]  by your doctor.  You may return to work once you are cleared by your doctor.  Do not drive a car for 6 weeks or until released by your surgeon.  Do not drive while taking narcotics.  TED HOSE STOCKINGS Wear the elastic stockings on both legs for three weeks following surgery during the day. You may remove them at night for sleeping.  WEIGHT BEARING Weight bearing as tolerated with assist device  (walker, cane, etc) as directed, use it as long as suggested by your surgeon or therapist, typically at least 4-6 weeks.  POSTOPERATIVE CONSTIPATION PROTOCOL Constipation - defined medically as fewer than three stools per week and severe constipation as less than one stool per week.  One of the most common issues patients have following surgery is constipation.  Even if you have a regular bowel pattern at home, your normal regimen is likely to be disrupted due to multiple reasons following surgery.  Combination of anesthesia, postoperative narcotics, change in appetite and fluid intake all can affect your bowels.  In order to avoid complications following surgery, here are some recommendations in order to help you during your recovery period.  Colace (docusate) - Pick up an over-the-counter form of Colace or another stool softener and take twice a day as long as you are requiring postoperative pain medications.  Take with a full glass of water daily.  If you experience loose stools or diarrhea, hold the colace until you stool forms back up. If your symptoms do not get better within 1 week or if they get worse, check with your doctor. Dulcolax (bisacodyl) - Pick up over-the-counter and take as directed by the product packaging as needed to assist with the movement of your bowels.  Take with a full glass of water.  Use this product as needed if not relieved by Colace only.  MiraLax (polyethylene glycol) - Pick up over-the-counter to have on hand. MiraLax is a solution that will increase the amount of water in your bowels to assist with bowel movements.  Take as directed and can mix with a glass of water, juice, soda, coffee, or tea. Take if you go more than two days without a movement. Do not use MiraLax more than once per day. Call your doctor if you are still constipated or irregular after using this medication for 7 days in a row.  If you continue to have problems with postoperative constipation, please  contact the office for further assistance and recommendations.  If you experience "the worst abdominal pain ever" or develop nausea or vomiting, please contact the office immediatly for further recommendations for treatment.  ITCHING If you experience itching with your medications, try taking only a single pain pill, or even half a pain pill at a time.  You can also use Benadryl over the counter for itching or also to help with sleep.   MEDICATIONS See your medication summary on the "After Visit Summary" that the nursing staff will review with you prior to discharge.  You may have some home medications which will be placed on hold until you complete the course of blood thinner medication.  It is important for you to complete the blood thinner medication as prescribed by your surgeon.  Continue your approved medications as instructed at time of discharge.  PRECAUTIONS If you experience chest pain or shortness of breath - call 911 immediately for transfer to the hospital emergency department.  If you develop a fever greater that 101 F, purulent drainage from wound, increased  redness or drainage from wound, foul odor from the wound/dressing, or calf pain - CONTACT YOUR SURGEON.                                                   FOLLOW-UP APPOINTMENTS Make sure you keep all of your appointments after your operation with your surgeon and caregivers. You should call the office at the above phone number and make an appointment for approximately two weeks after the date of your surgery or on the date instructed by your surgeon outlined in the "After Visit Summary".  RANGE OF MOTION AND STRENGTHENING EXERCISES  Rehabilitation of the knee is important following a knee injury or an operation. After just a few days of immobilization, the muscles of the thigh which control the knee become weakened and shrink (atrophy). Knee exercises are designed to build up the tone and strength of the thigh muscles and to  improve knee motion. Often times heat used for twenty to thirty minutes before working out will loosen up your tissues and help with improving the range of motion but do not use heat for the first two weeks following surgery. These exercises can be done on a training (exercise) mat, on the floor, on a table or on a bed. Use what ever works the best and is most comfortable for you Knee exercises include:  Leg Lifts - While your knee is still immobilized in a splint or cast, you can do straight leg raises. Lift the leg to 60 degrees, hold for 3 sec, and slowly lower the leg. Repeat 10-20 times 2-3 times daily. Perform this exercise against resistance later as your knee gets better.  Quad and Hamstring Sets - Tighten up the muscle on the front of the thigh (Quad) and hold for 5-10 sec. Repeat this 10-20 times hourly. Hamstring sets are done by pushing the foot backward against an object and holding for 5-10 sec. Repeat as with quad sets.  Leg Slides: Lying on your back, slowly slide your foot toward your buttocks, bending your knee up off the floor (only go as far as is comfortable). Then slowly slide your foot back down until your leg is flat on the floor again. Angel Wings: Lying on your back spread your legs to the side as far apart as you can without causing discomfort.  A rehabilitation program following serious knee injuries can speed recovery and prevent re-injury in the future due to weakened muscles. Contact your doctor or a physical therapist for more information on knee rehabilitation.   POST-OPERATIVE OPIOID TAPER INSTRUCTIONS: It is important to wean off of your opioid medication as soon as possible. If you do not need pain medication after your surgery it is ok to stop day one. Opioids include: Codeine, Hydrocodone(Norco, Vicodin), Oxycodone(Percocet, oxycontin) and hydromorphone amongst others.  Long term and even short term use of opiods can cause: Increased pain  response Dependence Constipation Depression Respiratory depression And more.  Withdrawal symptoms can include Flu like symptoms Nausea, vomiting And more Techniques to manage these symptoms Hydrate well Eat regular healthy meals Stay active Use relaxation techniques(deep breathing, meditating, yoga) Do Not substitute Alcohol to help with tapering If you have been on opioids for less than two weeks and do not have pain than it is ok to stop all together.  Plan to wean off of opioids  plan should start within one week post op of your joint replacement. Maintain the same interval or time between taking each dose and first decrease the dose.  Cut the total daily intake of opioids by one tablet each day Next start to increase the time between doses. The last dose that should be eliminated is the evening dose.   IF YOU ARE TRANSFERRED TO A SKILLED REHAB FACILITY If the patient is transferred to a skilled rehab facility following release from the hospital, a list of the current medications will be sent to the facility for the patient to continue.  When discharged from the skilled rehab facility, please have the facility set up the patient's Home Health Physical Therapy prior to being released. Also, the skilled facility will be responsible for providing the patient with their medications at time of release from the facility to include their pain medication, the muscle relaxants, and their blood thinner medication. If the patient is still at the rehab facility at time of the two week follow up appointment, the skilled rehab facility will also need to assist the patient in arranging follow up appointment in our office and any transportation needs.  MAKE SURE YOU:  Understand these instructions.  Get help right away if you are not doing well or get worse.   DENTAL ANTIBIOTICS:  In most cases prophylactic antibiotics for Dental procdeures after total joint surgery are not  necessary.  Exceptions are as follows:  1. History of prior total joint infection  2. Severely immunocompromised (Organ Transplant, cancer chemotherapy, Rheumatoid biologic meds such as Humera)  3. Poorly controlled diabetes (A1C &gt; 8.0, blood glucose over 200)  If you have one of these conditions, contact your surgeon for an antibiotic prescription, prior to your dental procedure.    Pick up stool softner and laxative for home use following surgery while on pain medications. Do not submerge incision under water. Please use good hand washing techniques while changing dressing each day. May shower starting three days after surgery. Please use a clean towel to pat the incision dry following showers. Continue to use ice for pain and swelling after surgery. Do not use any lotions or creams on the incision until instructed by your surgeon.  

## 2022-05-18 NOTE — Evaluation (Signed)
Physical Therapy Evaluation Patient Details Name: Andrew Watson MRN: [id number] DOB: 1940/07/11 Today's Date: 05/18/2022  History of Present Illness  82 yo male presents to therapy s/p L TKA on 05/18/2022 due to failure of conservative measures. Pt has PMH including but not limited to: dCHF, s/p TAVR, TIA, hypothyroidism, PE, and back sx.  Clinical Impression      ARIEON CORCORAN is a 82 y.o. male POD 0 s/p L TKA. Patient reports mod I with occasional use of SPC with mobility at baseline. Patient is now limited by functional impairments (see PT problem list below) and requires min A for trunk for bed mobility and min guard and cues for transfers. Patient was able to ambulate 60 feet with RW and min guard level of assist. Patient instructed in exercise to facilitate ROM and circulation to manage edema.  Patient will benefit from continued skilled PT interventions to address impairments and progress towards PLOF. Acute PT will follow to progress mobility and stair training in preparation for safe discharge home with family support and [facility] services.    Recommendations for follow up therapy are one component of a multi-disciplinary discharge planning process, led by the attending physician.  Recommendations may be updated based on patient status, additional functional criteria and insurance authorization.  Follow Up Recommendations       Assistance Recommended at Discharge Intermittent Supervision/Assistance  Patient can return home with the following  A little help with walking and/or transfers;A little help with bathing/dressing/bathroom;Assistance with cooking/housework;Assist for transportation;Help with stairs or ramp for entrance    Equipment Recommendations Rolling walker (2 wheels)  Recommendations for Other Services       Functional Status Assessment Patient has had a recent decline in their functional status and demonstrates the ability to make significant improvements in function  in a reasonable and predictable amount of time.     Precautions / Restrictions Precautions Precautions: Fall;Knee Restrictions Weight Bearing Restrictions: No      Mobility  Bed Mobility Overal bed mobility: Needs Assistance Bed Mobility: Supine to Sit     Supine to sit: Min assist     General bed mobility comments: HOB slightly elevated min A for trunk pt able to transition B LEs to EOB    Transfers Overall transfer level: Needs assistance Equipment used: Rolling walker (2 wheels) Transfers: Sit to/from Stand Sit to Stand: Min guard           General transfer comment: cues for safety, UE and AD placement    Ambulation/Gait Ambulation/Gait assistance: Min guard Gait Distance (Feet): 60 Feet Assistive device: Rolling walker (2 wheels) Gait Pattern/deviations: Step-to pattern, Antalgic Gait velocity: decreased     General Gait Details: limited L knee flexion with swing phase  Stairs            Wheelchair Mobility    Modified Rankin (Stroke Patients Only)       Balance Overall balance assessment: Needs assistance Sitting-balance support: Feet supported Sitting balance-Leahy Scale: Good     Standing balance support: During functional activity, Bilateral upper extremity supported, Reliant on assistive device for balance (static standing no UE support with min guard) Standing balance-Leahy Scale: Poor                               Pertinent Vitals/Pain Pain Assessment Pain Assessment: 0-10 Pain Score: 5  Pain Location: L LE knee and hip Pain Descriptors / Indicators: Constant, Aching, Guarding, Operative  site guarding Pain Intervention(s): Limited activity within patient's tolerance, Monitored during session, Premedicated before session, Repositioned, Ice applied    Home Living Family/patient expects to be discharged to:: Private residence Living Arrangements: Alone (pt will have family assist for a few days s/p TKA) Available  Help at Discharge: Family Type of Home: House Home Access: Level entry       Home Layout: One level Home Equipment: Cane - single point      Prior Function Prior Level of Function : Independent/Modified Independent             Mobility Comments: intermittent use of SPC for mobility tasks, mod  with ADLs self care tasks, IADLs and driving       Hand Dominance        Extremity/Trunk Assessment        Lower Extremity Assessment Lower Extremity Assessment: LLE deficits/detail LLE Deficits / Details: ankle DF/PF 5/5; SLR < 10 degree lag LLE Sensation: WNL    Cervical / Trunk Assessment Cervical / Trunk Assessment: Normal  Communication   Communication: No difficulties  Cognition Arousal/Alertness: Awake/alert Behavior During Therapy: WFL for tasks assessed/performed Overall Cognitive Status: Within Functional Limits for tasks assessed                                          General Comments      Exercises Total Joint Exercises Ankle Circles/Pumps: AROM, 20 reps, Both   Assessment/Plan    PT Assessment Patient needs continued PT services  PT Problem List Decreased strength;Decreased range of motion;Decreased activity tolerance;Decreased balance;Decreased mobility;Decreased knowledge of precautions;Pain       PT Treatment Interventions DME instruction;Gait training;Stair training;Functional mobility training;Therapeutic activities;Therapeutic exercise;Balance training;Neuromuscular re-education;Patient/family education;Modalities    PT Goals (Current goals can be found in the Care Plan section)  Acute Rehab PT Goals Patient Stated Goal: move better and cross my leg PT Goal Formulation: With patient Time For Goal Achievement: 06/01/22 Potential to Achieve Goals: Good    Frequency 7X/week     Co-evaluation               AM-PAC PT "6 Clicks" Mobility  Outcome Measure Help needed turning from your back to your side while in a  flat bed without using bedrails?: A Little Help needed moving from lying on your back to sitting on the side of a flat bed without using bedrails?: A Little Help needed moving to and from a bed to a chair (including a wheelchair)?: A Little Help needed standing up from a chair using your arms (e.g., wheelchair or bedside chair)?: A Little Help needed to walk in hospital room?: A Little Help needed climbing 3-5 steps with a railing? : A Lot 6 Click Score: 17    End of Session Equipment Utilized During Treatment: Gait belt Activity Tolerance: Patient tolerated treatment well;No increased pain Patient left: in chair;with call bell/phone within reach (pt instructed to use call bell and await staff S and A) Nurse Communication: Mobility status;Patient requests pain meds (pain medicaiton administered during therapy session) PT Visit Diagnosis: Unsteadiness on feet (R26.81);Other abnormalities of gait and mobility (R26.89);Muscle weakness (generalized) (M62.81);Pain;Difficulty in walking, not elsewhere classified (R26.2) Pain - Right/Left: Left Pain - part of body: Leg;Knee;Hip    Time: 1824-1900 PT Time Calculation (min) (ACUTE ONLY): 36 min   Charges:   PT Evaluation $PT Eval Low Complexity: 1 Low PT  Treatments $Gait Training: 8-22 mins        Rica Mote, PT   Jacqualyn Posey 05/18/2022, 7:13 PM

## 2022-05-18 NOTE — Progress Notes (Signed)
Orthopedic Tech Progress Note Patient Details:  Andrew Watson [birth year] [id number]  CPM Left Knee CPM Left Knee: Off Left Knee Flexion (Degrees): 40 Left Knee Extension (Degrees): 10  Post Interventions Patient Tolerated: Well  [name] 05/18/2022, 6:04 PM

## 2022-05-18 NOTE — Anesthesia Procedure Notes (Signed)
Procedure Name: LMA Insertion Date/Time: 05/18/2022 12:24 PM  Performed by: Orest Dikes, CRNAPre-anesthesia Checklist: Patient identified, Emergency Drugs available and Patient being monitored Patient Re-evaluated:Patient Re-evaluated prior to induction Oxygen Delivery Method: Circle system utilized Preoxygenation: Pre-oxygenation with 100% oxygen Induction Type: IV induction LMA: LMA with gastric port inserted LMA Size: 4.0 Number of attempts: 1 Placement Confirmation: positive ETCO2 and breath sounds checked- equal and bilateral Tube secured with: Tape Dental Injury: Teeth and Oropharynx as per pre-operative assessment

## 2022-05-18 NOTE — Progress Notes (Signed)
Orthopedic Tech Progress Note Patient Details:  Andrew Watson January 02, 1941 [id number]  CPM Left Knee CPM Left Knee: On Left Knee Flexion (Degrees): 40 Left Knee Extension (Degrees): 10  Post Interventions Patient Tolerated: Well  [name] 05/18/2022, 2:19 PM

## 2022-05-19 ENCOUNTER — Encounter (HOSPITAL_COMMUNITY): Payer: Self-pay | Admitting: Orthopedic Surgery

## 2022-05-19 DIAGNOSIS — M1712 Unilateral primary osteoarthritis, left knee: Secondary | ICD-10-CM | POA: Diagnosis not present

## 2022-05-19 LAB — CBC
HCT: 37 % — ABNORMAL LOW (ref 39.0–52.0)
Hemoglobin: 12 g/dL — ABNORMAL LOW (ref 13.0–17.0)
MCH: 30.5 pg (ref 26.0–34.0)
MCHC: 32.4 g/dL (ref 30.0–36.0)
MCV: 93.9 fL (ref 80.0–100.0)
Platelets: 141 10*3/uL — ABNORMAL LOW (ref 150–400)
RBC: 3.94 MIL/uL — ABNORMAL LOW (ref 4.22–5.81)
RDW: 12.9 % (ref 11.5–15.5)
WBC: 10.2 10*3/uL (ref 4.0–10.5)
nRBC: 0 % (ref 0.0–0.2)

## 2022-05-19 LAB — BASIC METABOLIC PANEL
Anion gap: 9 (ref 5–15)
BUN: 16 mg/dL (ref 8–23)
CO2: 23 mmol/L (ref 22–32)
Calcium: 8.8 mg/dL — ABNORMAL LOW (ref 8.9–10.3)
Chloride: 104 mmol/L (ref 98–111)
Creatinine, Ser: 1.07 mg/dL (ref 0.61–1.24)
GFR, Estimated: >60 mL/min (ref >60)
Glucose, Bld: 139 mg/dL — ABNORMAL HIGH (ref 70–99)
Potassium: 4.6 mmol/L (ref 3.5–5.1)
Sodium: 136 mmol/L (ref 135–145)

## 2022-05-19 MED ORDER — TRAMADOL HCL 50 MG PO TABS: 50.0000 mg | ORAL_TABLET | Freq: Four times a day (QID) | ORAL | 0 refills | Status: DC | PRN

## 2022-05-19 MED ORDER — OXYCODONE HCL 5 MG PO TABS: 5.0000 mg | ORAL_TABLET | Freq: Four times a day (QID) | ORAL | 0 refills | Status: DC | PRN

## 2022-05-19 MED ORDER — ASPIRIN 81 MG PO TBEC
81.0000 mg | DELAYED_RELEASE_TABLET | Freq: Two times a day (BID) | ORAL | Status: DC
  Administered 2022-05-19: 81 mg via ORAL
  Filled 2022-05-19: qty 1

## 2022-05-19 MED ORDER — ASPIRIN 81 MG PO TBEC: 81.0000 mg | DELAYED_RELEASE_TABLET | Freq: Two times a day (BID) | ORAL | 0 refills | Status: AC

## 2022-05-19 MED ORDER — METHOCARBAMOL 500 MG PO TABS: 500.0000 mg | ORAL_TABLET | Freq: Four times a day (QID) | ORAL | 0 refills | Status: DC | PRN

## 2022-05-19 NOTE — Progress Notes (Signed)
Subjective: 1 Day Post-Op Procedure(s) (LRB): TOTAL KNEE ARTHROPLASTY (Left) Patient reports pain as mild.   Patient seen in rounds by Dr. Lequita Halt. Patient is well, and has had no acute complaints or problems No issues overnight. Denies chest pain, SOB, or calf pain. Voiding without difficulty We will continue therapy today, ambulated 60' yesterday.   Objective: Vital signs in last 24 hours: Temp:  [97.6 F (36.4 C)-98.1 F (36.7 C)] 97.6 F (36.4 C) (04/23 0554) Pulse Rate:  [70-78] 75 (04/23 0554) Resp:  [16-26] 18 (04/23 0554) BP: (104-136)/(64-82) 124/72 (04/23 0554) SpO2:  [96 %-100 %] 96 % (04/23 0554) Weight:  [97 kg] 97 kg (04/22 1103)  Intake/Output from previous day:  Intake/Output Summary (Last 24 hours) at 05/19/2022 0815 Last data filed at 05/19/2022 0643 Gross per 24 hour  Intake 2568.75 ml  Output 1575 ml  Net 993.75 ml     Intake/Output this shift: No intake/output data recorded.  Labs: Recent Labs    05/19/22 0425  HGB 12.0*   Recent Labs    05/19/22 0425  WBC 10.2  RBC 3.94*  HCT 37.0*  PLT 141*   Recent Labs    05/19/22 0425  NA 136  K 4.6  CL 104  CO2 23  BUN 16  CREATININE 1.07  GLUCOSE 139*  CALCIUM 8.8*   No results for input(s): "LABPT", "INR" in the last 72 hours.  Exam: General - Patient is Alert and Oriented Extremity - Neurologically intact Neurovascular intact Sensation intact distally Dorsiflexion/Plantar flexion intact Dressing - dressing C/D/I Motor Function - intact, moving foot and toes well on exam.   Past Medical History:  Diagnosis Date   Aortic valve stenosis    Arthritis    CAD (coronary artery disease) 05/07/2011   Mild atherosclerosis by cardiac catheterization February 2021   CHF (congestive heart failure)    GERD (gastroesophageal reflux disease)    Heart murmur    History of atrial fibrillation 2013   Reportedly limited episode   History of kidney stones    History of MRSA infection     History of pancreatitis 2017   [facility]   History of pulmonary embolism 2017   History of TIA (transient ischemic attack)    November 2020   Hypothyroidism    S/P TAVR (transcatheter aortic valve replacement) 05/02/2019   s/p TAVR with a 26 mm Edwards Sapien 3 Ultra via the TF approach with Dr. Aundra Dubin & Dr. Laneta Simmers     Assessment/Plan: 1 Day Post-Op Procedure(s) (LRB): TOTAL KNEE ARTHROPLASTY (Left) Principal Problem:   OA (osteoarthritis) of knee Active Problems:   Primary osteoarthritis of left knee  Estimated body mass index is 32.52 kg/m as calculated from the following:   Height as of this encounter:  (1.727 m).   Weight as of this encounter: 97 kg. Advance diet Up with therapy D/C IV fluids   Patient's anticipated LOS is less than 2 midnights, meeting these requirements: - Younger than 59 - Lives within 1 hour of care - Has a competent adult at home to recover with post-op recover - NO history of  - Chronic pain requiring opioids  - Diabetes  - Coronary Artery Disease  - Heart failure  - Heart attack  - Cardiac arrhythmia  - Respiratory Failure/COPD  - Renal failure  - Anemia  - Advanced Liver disease  DVT Prophylaxis - Aspirin Weight bearing as tolerated. Continue therapy.  Patient was on Plavix and ASA prior to surgery. At  preop cardiac clearance appt (3 weeks ago), Plavix was discontinued by Dr. Diona Browner (just on 81 mg ASA QD). Will increase this to BID for three weeks following surgery.   Plan is to go Home after hospital stay. Plan for discharge later today if progresses with therapy and meeting goals. Ordering HHPT. Follow-up in the office in 2 weeks.  The PDMP database was reviewed today prior to any opioid medications being prescribed to this patient.  Arther Abbott, PA-C Orthopedic Surgery [phone] 05/19/2022, 8:15 AM

## 2022-05-19 NOTE — TOC Transition Note (Signed)
Transition of Care [facility]) - CM/SW Discharge Note  Patient Details  Name: Andrew Watson MRN: [id number] Date of Birth: 05/04/1940  [facility] CM/SW Contact:  Ewing Schlein, LCSW Phone Number: 05/19/2022, 10:40 AM  Clinical Narrative: Patient is expected to discharge home after working with PT. CSW met with patient to confirm discharge plan and needs. Patient will go home with HHPT, which CSW set up with [facility]. HH orders have been placed. Patient will need a rolling walker, which [employer] delivered to patient's room. TOC signing off.   Barriers to Discharge: No Barriers Identified  Patient Goals and CMS Choice CMS Medicare.gov Compare Post Acute Care list provided to:: Patient Choice offered to / list presented to : Patient  Discharge Plan and Services Additional resources added to the After Visit Summary for          DME Arranged: Walker rolling DME Agency: [employer] Representative spoke with at DME Agency: Prearranged in orthopedist's office HH Arranged: PT HH Agency: [facility] Date [facility] Agency Contacted: 05/19/22 Time HH Agency Contacted: [phone] Representative spoke with at [facility] Agency: Cindie  Social Determinants of Health (SDOH) Interventions SDOH Screenings   Food Insecurity: No Food Insecurity (05/18/2022)  Housing: Low Risk  (05/18/2022)  Transportation Needs: No Transportation Needs (05/18/2022)  Utilities: Not At Risk (05/18/2022)  Depression (PHQ2-9): Low Risk  (11/27/2021)  Tobacco Use: Low Risk  (05/18/2022)   Readmission Risk Interventions     No data to display

## 2022-05-19 NOTE — Progress Notes (Signed)
Physical Therapy Treatment Patient Details Name: Andrew Watson MRN: [id number] DOB: Aug 31, 1940 Today's Date: 05/19/2022   History of Present Illness 82 yo male presents to therapy s/p L TKA on 05/18/2022 due to failure of conservative measures. Pt has PMH including but not limited to: dCHF, s/p TAVR, TIA, hypothyroidism, PE, and back sx.    PT Comments    Pt is mobilizing well and is ready to DC home from a PT standpoint. He ambulated 53' with RW, no loss of balance. Instructed pt & son in HEP, they demonstrate good understanding. Pt is very motivated and puts forth good effort.     Recommendations for follow up therapy are one component of a multi-disciplinary discharge planning process, led by the attending physician.  Recommendations may be updated based on patient status, additional functional criteria and insurance authorization.  Follow Up Recommendations       Assistance Recommended at Discharge Intermittent Supervision/Assistance  Patient can return home with the following A little help with walking and/or transfers;A little help with bathing/dressing/bathroom;Assistance with cooking/housework;Assist for transportation;Help with stairs or ramp for entrance   Equipment Recommendations  Rolling walker (2 wheels)    Recommendations for Other Services       Precautions / Restrictions Precautions Precautions: Fall;Knee Restrictions Weight Bearing Restrictions: No     Mobility  Bed Mobility Overal bed mobility: Modified Independent Bed Mobility: Supine to Sit     Supine to sit: Modified independent (Device/Increase time), HOB elevated          Transfers Overall transfer level: Needs assistance Equipment used: Rolling walker (2 wheels) Transfers: Sit to/from Stand Sit to Stand: Supervision           General transfer comment: cues for safety, UE and AD placement    Ambulation/Gait Ambulation/Gait assistance: Supervision Gait Distance (Feet): 130  Feet Assistive device: Rolling walker (2 wheels) Gait Pattern/deviations: Step-to pattern, Antalgic Gait velocity: decreased     General Gait Details: limited L knee flexion with swing phase, steady, no loss of balance   Stairs             Wheelchair Mobility    Modified Rankin (Stroke Patients Only)       Balance Overall balance assessment: Needs assistance Sitting-balance support: Feet supported Sitting balance-Leahy Scale: Good     Standing balance support: During functional activity, Bilateral upper extremity supported, Reliant on assistive device for balance (static standing no UE support with min guard) Standing balance-Leahy Scale: Poor                              Cognition Arousal/Alertness: Awake/alert Behavior During Therapy: WFL for tasks assessed/performed Overall Cognitive Status: Within Functional Limits for tasks assessed                                          Exercises Total Joint Exercises Ankle Circles/Pumps: AROM, 20 reps, Both Quad Sets: AROM, Both, 10 reps, Supine Short Arc Quad: AROM, Left, 10 reps, Supine Heel Slides: AAROM, Left, 10 reps, Supine Hip ABduction/ADduction: Left, 10 reps, Supine Straight Leg Raises: AROM, Left, 10 reps, Supine Long Arc Quad: AROM, Left, 10 reps, Seated Knee Flexion: AAROM, Left, 10 reps, Seated Goniometric ROM: 10-60* AAROM L knee    General Comments        Pertinent Vitals/Pain Pain Assessment Pain Score: 3  Pain Location: L LE knee and hip Pain Descriptors / Indicators: Constant, Aching, Guarding, Operative site guarding Pain Intervention(s): Limited activity within patient's tolerance, Monitored during session, Premedicated before session, Ice applied    Home Living                          Prior Function            PT Goals (current goals can now be found in the care plan section) Acute Rehab PT Goals Patient Stated Goal: [place] PT Goal Formulation: With patient/family Time For Goal Achievement: 06/01/22 Potential to Achieve Goals: Good Progress towards PT goals: Goals met/education completed, patient discharged from PT    Frequency    7X/week      PT Plan Current plan remains appropriate    Co-evaluation              AM-PAC PT "6 Clicks" Mobility   Outcome Measure  Help needed turning from your back to your side while in a flat bed without using bedrails?: A Little Help needed moving from lying on your back to sitting on the side of a flat bed without using bedrails?: A Little Help needed moving to and from a bed to a chair (including a wheelchair)?: A Little Help needed standing up from a chair using your arms (e.g., wheelchair or bedside chair)?: A Little Help needed to walk in hospital room?: None Help needed climbing 3-5 steps with a railing? : A Little 6 Click Score: 19    End of Session Equipment Utilized During Treatment: Gait belt Activity Tolerance: Patient tolerated treatment well;No increased pain Patient left: in chair;with call bell/phone within reach (pt instructed to use call bell and await staff S and A) Nurse Communication: Mobility status (pain medicaiton administered during therapy session) PT Visit Diagnosis: Unsteadiness on feet (R26.81);Other abnormalities of gait and mobility (R26.89);Muscle weakness (generalized) (M62.81);Pain;Difficulty in walking, not elsewhere classified (R26.2) Pain - Right/Left: Left Pain - part of body: Leg;Knee;Hip     Time: 5784-6962 PT Time Calculation (min) (ACUTE ONLY): 35 min  Charges:  $Gait Training: 8-22 mins $Therapeutic Exercise: 8-22 mins                     Ralene Bathe Kistler PT 05/19/2022  Acute Rehabilitation Services  Office [phone]

## 2022-05-20 DIAGNOSIS — I5033 Acute on chronic diastolic (congestive) heart failure: Secondary | ICD-10-CM | POA: Diagnosis not present

## 2022-05-20 DIAGNOSIS — Z9181 History of falling: Secondary | ICD-10-CM | POA: Diagnosis not present

## 2022-05-20 DIAGNOSIS — Z8673 Personal history of transient ischemic attack (TIA), and cerebral infarction without residual deficits: Secondary | ICD-10-CM | POA: Diagnosis not present

## 2022-05-20 DIAGNOSIS — Z7982 Long term (current) use of aspirin: Secondary | ICD-10-CM | POA: Diagnosis not present

## 2022-05-20 DIAGNOSIS — I4891 Unspecified atrial fibrillation: Secondary | ICD-10-CM | POA: Diagnosis not present

## 2022-05-20 DIAGNOSIS — Z96652 Presence of left artificial knee joint: Secondary | ICD-10-CM | POA: Diagnosis not present

## 2022-05-20 DIAGNOSIS — I2510 Atherosclerotic heart disease of native coronary artery without angina pectoris: Secondary | ICD-10-CM | POA: Diagnosis not present

## 2022-05-20 DIAGNOSIS — Z87442 Personal history of urinary calculi: Secondary | ICD-10-CM | POA: Diagnosis not present

## 2022-05-20 DIAGNOSIS — Z471 Aftercare following joint replacement surgery: Secondary | ICD-10-CM | POA: Diagnosis not present

## 2022-05-20 DIAGNOSIS — M1711 Unilateral primary osteoarthritis, right knee: Secondary | ICD-10-CM | POA: Diagnosis not present

## 2022-05-20 DIAGNOSIS — G4700 Insomnia, unspecified: Secondary | ICD-10-CM | POA: Diagnosis not present

## 2022-05-20 DIAGNOSIS — Z952 Presence of prosthetic heart valve: Secondary | ICD-10-CM | POA: Diagnosis not present

## 2022-05-20 DIAGNOSIS — K219 Gastro-esophageal reflux disease without esophagitis: Secondary | ICD-10-CM | POA: Diagnosis not present

## 2022-05-20 DIAGNOSIS — E039 Hypothyroidism, unspecified: Secondary | ICD-10-CM | POA: Diagnosis not present

## 2022-05-20 DIAGNOSIS — Z8614 Personal history of Methicillin resistant Staphylococcus aureus infection: Secondary | ICD-10-CM | POA: Diagnosis not present

## 2022-05-20 DIAGNOSIS — Z86711 Personal history of pulmonary embolism: Secondary | ICD-10-CM | POA: Diagnosis not present

## 2022-05-22 DIAGNOSIS — Z9181 History of falling: Secondary | ICD-10-CM | POA: Diagnosis not present

## 2022-05-22 DIAGNOSIS — Z7982 Long term (current) use of aspirin: Secondary | ICD-10-CM | POA: Diagnosis not present

## 2022-05-22 DIAGNOSIS — Z86711 Personal history of pulmonary embolism: Secondary | ICD-10-CM | POA: Diagnosis not present

## 2022-05-22 DIAGNOSIS — G4700 Insomnia, unspecified: Secondary | ICD-10-CM | POA: Diagnosis not present

## 2022-05-22 DIAGNOSIS — E039 Hypothyroidism, unspecified: Secondary | ICD-10-CM | POA: Diagnosis not present

## 2022-05-22 DIAGNOSIS — I2510 Atherosclerotic heart disease of native coronary artery without angina pectoris: Secondary | ICD-10-CM | POA: Diagnosis not present

## 2022-05-22 DIAGNOSIS — I4891 Unspecified atrial fibrillation: Secondary | ICD-10-CM | POA: Diagnosis not present

## 2022-05-22 DIAGNOSIS — Z8614 Personal history of Methicillin resistant Staphylococcus aureus infection: Secondary | ICD-10-CM | POA: Diagnosis not present

## 2022-05-22 DIAGNOSIS — K219 Gastro-esophageal reflux disease without esophagitis: Secondary | ICD-10-CM | POA: Diagnosis not present

## 2022-05-22 DIAGNOSIS — Z471 Aftercare following joint replacement surgery: Secondary | ICD-10-CM | POA: Diagnosis not present

## 2022-05-22 DIAGNOSIS — Z8673 Personal history of transient ischemic attack (TIA), and cerebral infarction without residual deficits: Secondary | ICD-10-CM | POA: Diagnosis not present

## 2022-05-22 DIAGNOSIS — Z96652 Presence of left artificial knee joint: Secondary | ICD-10-CM | POA: Diagnosis not present

## 2022-05-22 DIAGNOSIS — M1711 Unilateral primary osteoarthritis, right knee: Secondary | ICD-10-CM | POA: Diagnosis not present

## 2022-05-22 DIAGNOSIS — I5033 Acute on chronic diastolic (congestive) heart failure: Secondary | ICD-10-CM | POA: Diagnosis not present

## 2022-05-22 DIAGNOSIS — Z87442 Personal history of urinary calculi: Secondary | ICD-10-CM | POA: Diagnosis not present

## 2022-05-22 DIAGNOSIS — Z952 Presence of prosthetic heart valve: Secondary | ICD-10-CM | POA: Diagnosis not present

## 2022-05-25 DIAGNOSIS — I2510 Atherosclerotic heart disease of native coronary artery without angina pectoris: Secondary | ICD-10-CM | POA: Diagnosis not present

## 2022-05-25 DIAGNOSIS — Z9181 History of falling: Secondary | ICD-10-CM | POA: Diagnosis not present

## 2022-05-25 DIAGNOSIS — M1711 Unilateral primary osteoarthritis, right knee: Secondary | ICD-10-CM | POA: Diagnosis not present

## 2022-05-25 DIAGNOSIS — G4700 Insomnia, unspecified: Secondary | ICD-10-CM | POA: Diagnosis not present

## 2022-05-25 DIAGNOSIS — I4891 Unspecified atrial fibrillation: Secondary | ICD-10-CM | POA: Diagnosis not present

## 2022-05-25 DIAGNOSIS — Z96652 Presence of left artificial knee joint: Secondary | ICD-10-CM | POA: Diagnosis not present

## 2022-05-25 DIAGNOSIS — Z87442 Personal history of urinary calculi: Secondary | ICD-10-CM | POA: Diagnosis not present

## 2022-05-25 DIAGNOSIS — Z471 Aftercare following joint replacement surgery: Secondary | ICD-10-CM | POA: Diagnosis not present

## 2022-05-25 DIAGNOSIS — Z8673 Personal history of transient ischemic attack (TIA), and cerebral infarction without residual deficits: Secondary | ICD-10-CM | POA: Diagnosis not present

## 2022-05-25 DIAGNOSIS — Z86711 Personal history of pulmonary embolism: Secondary | ICD-10-CM | POA: Diagnosis not present

## 2022-05-25 DIAGNOSIS — Z952 Presence of prosthetic heart valve: Secondary | ICD-10-CM | POA: Diagnosis not present

## 2022-05-25 DIAGNOSIS — E039 Hypothyroidism, unspecified: Secondary | ICD-10-CM | POA: Diagnosis not present

## 2022-05-25 DIAGNOSIS — Z7982 Long term (current) use of aspirin: Secondary | ICD-10-CM | POA: Diagnosis not present

## 2022-05-25 DIAGNOSIS — Z8614 Personal history of Methicillin resistant Staphylococcus aureus infection: Secondary | ICD-10-CM | POA: Diagnosis not present

## 2022-05-25 DIAGNOSIS — K219 Gastro-esophageal reflux disease without esophagitis: Secondary | ICD-10-CM | POA: Diagnosis not present

## 2022-05-25 DIAGNOSIS — I5033 Acute on chronic diastolic (congestive) heart failure: Secondary | ICD-10-CM | POA: Diagnosis not present

## 2022-05-27 DIAGNOSIS — E039 Hypothyroidism, unspecified: Secondary | ICD-10-CM | POA: Diagnosis not present

## 2022-05-27 DIAGNOSIS — Z471 Aftercare following joint replacement surgery: Secondary | ICD-10-CM | POA: Diagnosis not present

## 2022-05-27 DIAGNOSIS — Z8614 Personal history of Methicillin resistant Staphylococcus aureus infection: Secondary | ICD-10-CM | POA: Diagnosis not present

## 2022-05-27 DIAGNOSIS — Z87442 Personal history of urinary calculi: Secondary | ICD-10-CM | POA: Diagnosis not present

## 2022-05-27 DIAGNOSIS — I5033 Acute on chronic diastolic (congestive) heart failure: Secondary | ICD-10-CM | POA: Diagnosis not present

## 2022-05-27 DIAGNOSIS — Z96652 Presence of left artificial knee joint: Secondary | ICD-10-CM | POA: Diagnosis not present

## 2022-05-27 DIAGNOSIS — Z9181 History of falling: Secondary | ICD-10-CM | POA: Diagnosis not present

## 2022-05-27 DIAGNOSIS — M1711 Unilateral primary osteoarthritis, right knee: Secondary | ICD-10-CM | POA: Diagnosis not present

## 2022-05-27 DIAGNOSIS — Z86711 Personal history of pulmonary embolism: Secondary | ICD-10-CM | POA: Diagnosis not present

## 2022-05-27 DIAGNOSIS — K219 Gastro-esophageal reflux disease without esophagitis: Secondary | ICD-10-CM | POA: Diagnosis not present

## 2022-05-27 DIAGNOSIS — Z952 Presence of prosthetic heart valve: Secondary | ICD-10-CM | POA: Diagnosis not present

## 2022-05-27 DIAGNOSIS — I4891 Unspecified atrial fibrillation: Secondary | ICD-10-CM | POA: Diagnosis not present

## 2022-05-27 DIAGNOSIS — Z7982 Long term (current) use of aspirin: Secondary | ICD-10-CM | POA: Diagnosis not present

## 2022-05-27 DIAGNOSIS — I2510 Atherosclerotic heart disease of native coronary artery without angina pectoris: Secondary | ICD-10-CM | POA: Diagnosis not present

## 2022-05-27 DIAGNOSIS — Z8673 Personal history of transient ischemic attack (TIA), and cerebral infarction without residual deficits: Secondary | ICD-10-CM | POA: Diagnosis not present

## 2022-05-27 DIAGNOSIS — G4700 Insomnia, unspecified: Secondary | ICD-10-CM | POA: Diagnosis not present

## 2022-05-27 NOTE — Discharge Summary (Signed)
Patient ID: Andrew Watson MRN: [id number] DOB/AGE: Oct 25, 1940 82 y.o.  Admit date: 05/18/2022 Discharge date: 05/19/2022  Admission Diagnoses:  Principal Problem:   OA (osteoarthritis) of knee Active Problems:   Primary osteoarthritis of left knee   Discharge Diagnoses:  Same  Past Medical History:  Diagnosis Date   Aortic valve stenosis    Arthritis    CAD (coronary artery disease) 05/07/2011   Mild atherosclerosis by cardiac catheterization February 2021   CHF (congestive heart failure) (HCC)    GERD (gastroesophageal reflux disease)    Heart murmur    History of atrial fibrillation 2013   Reportedly limited episode   History of kidney stones    History of MRSA infection    History of pancreatitis 2017   [facility]   History of pulmonary embolism 2017   History of TIA (transient ischemic attack)    November 2020   Hypothyroidism    S/P TAVR (transcatheter aortic valve replacement) 05/02/2019   s/p TAVR with a 26 mm Edwards Sapien 3 Ultra via the TF approach with Dr. Aundra Dubin & Dr. Laneta Simmers     Surgeries: Procedure(s): TOTAL KNEE ARTHROPLASTY on 05/18/2022   Consultants:   Discharged Condition: Improved  Hospital Course: RYOTT RAFFERTY is an 82 y.o. male who was admitted 05/18/2022 for operative treatment ofOA (osteoarthritis) of knee. Patient has severe unremitting pain that affects sleep, daily activities, and work/hobbies. After pre-op clearance the patient was taken to the operating room on 05/18/2022 and underwent  Procedure(s): TOTAL KNEE ARTHROPLASTY.    Patient was given perioperative antibiotics:  Anti-infectives (From admission, onward)    Start     Dose/Rate Route Frequency Ordered Stop   05/18/22 1830  ceFAZolin (ANCEF) IVPB 2g/100 mL premix        2 g 200 mL/hr over 30 Minutes Intravenous Every 6 hours 05/18/22 1522 05/19/22 0045   05/18/22 1030  ceFAZolin (ANCEF) IVPB 2g/100 mL premix        2 g 200 mL/hr over 30 Minutes Intravenous On  call to O.R. 05/18/22 1016 05/18/22 1225        Patient was given sequential compression devices, early ambulation, and chemoprophylaxis to prevent DVT.  Patient benefited maximally from hospital stay and there were no complications.    Recent vital signs: No data found.   Recent laboratory studies: No results for input(s): "WBC", "HGB", "HCT", "PLT", "NA", "K", "CL", "CO2", "BUN", "CREATININE", "GLUCOSE", "INR", "CALCIUM" in the last 72 hours.  Invalid input(s): "PT", "2"   Discharge Medications:   Allergies as of 05/19/2022   No Known Allergies      Medication List     STOP taking these medications    aspirin 81 MG chewable tablet Replaced by: aspirin EC 81 MG tablet   famotidine 40 MG tablet Commonly known as: Pepcid       TAKE these medications    aspirin EC 81 MG tablet Take 1 tablet (81 mg total) by mouth 2 (two) times daily for 21 days. Then resume one 81 mg aspirin once a day. Replaces: aspirin 81 MG chewable tablet   levothyroxine 175 MCG tablet Commonly known as: SYNTHROID Take 175 mcg by mouth daily before breakfast.   methocarbamol 500 MG tablet Commonly known as: ROBAXIN Take 1 tablet (500 mg total) by mouth every 6 (six) hours as needed for muscle spasms.   oxyCODONE 5 MG immediate release tablet Commonly known as: Oxy IR/ROXICODONE Take 1-2 tablets (5-10 mg total) by mouth every 6 (  six) hours as needed for severe pain.   traMADol 50 MG tablet Commonly known as: ULTRAM Take 1-2 tablets (50-100 mg total) by mouth every 6 (six) hours as needed for moderate pain.               Discharge Care Instructions  (From admission, onward)           Start     Ordered   05/19/22 0000  Weight bearing as tolerated        05/19/22 0818   05/19/22 0000  Change dressing       Comments: You may remove the bulky bandage (ACE wrap and gauze) two days after surgery. You will have an adhesive waterproof bandage underneath. Leave this in place until  your first follow-up appointment.   05/19/22 0818            Diagnostic Studies: No results found.  Disposition: Discharge disposition: 01-Home or Self Care       Discharge Instructions     Call MD / Call 911   Complete by: As directed    If you experience chest pain or shortness of breath, CALL 911 and be transported to the hospital emergency room.  If you develope a fever above 101 F, pus (white drainage) or increased drainage or redness at the wound, or calf pain, call your surgeon's office.   Change dressing   Complete by: As directed    You may remove the bulky bandage (ACE wrap and gauze) two days after surgery. You will have an adhesive waterproof bandage underneath. Leave this in place until your first follow-up appointment.   Constipation Prevention   Complete by: As directed    Drink plenty of fluids.  Prune juice may be helpful.  You may use a stool softener, such as Colace (over the counter) 100 mg twice a day.  Use MiraLax (over the counter) for constipation as needed.   Diet - low sodium heart healthy   Complete by: As directed    Do not put a pillow under the knee. Place it under the heel.   Complete by: As directed    Driving restrictions   Complete by: As directed    No driving for two weeks   Post-operative opioid taper instructions:   Complete by: As directed    POST-OPERATIVE OPIOID TAPER INSTRUCTIONS: It is important to wean off of your opioid medication as soon as possible. If you do not need pain medication after your surgery it is ok to stop day one. Opioids include: Codeine, Hydrocodone(Norco, Vicodin), Oxycodone(Percocet, oxycontin) and hydromorphone amongst others.  Long term and even short term use of opiods can cause: Increased pain response Dependence Constipation Depression Respiratory depression And more.  Withdrawal symptoms can include Flu like symptoms Nausea, vomiting And more Techniques to manage these symptoms Hydrate  well Eat regular healthy meals Stay active Use relaxation techniques(deep breathing, meditating, yoga) Do Not substitute Alcohol to help with tapering If you have been on opioids for less than two weeks and do not have pain than it is ok to stop all together.  Plan to wean off of opioids This plan should start within one week post op of your joint replacement. Maintain the same interval or time between taking each dose and first decrease the dose.  Cut the total daily intake of opioids by one tablet each day Next start to increase the time between doses. The last dose that should be eliminated is the evening dose.  TED hose   Complete by: As directed    Use stockings (TED hose) for three weeks on both leg(s).  You may remove them at night for sleeping.   Weight bearing as tolerated   Complete by: As directed         Follow-up Information     Aluisio, Homero Fellers, MD. Schedule an appointment as soon as possible for a visit in 2 week(s).   Specialty: Orthopedic Surgery Contact information: [address] [place] [address] [phone]         Care, [facility] Follow up.   Specialty: Home Health Services Why: [facility] will provide PT in the home after discharge. Contact information: [address] [place] [address] [phone]                  Signed: Arther Abbott 05/27/2022, 11:18 AM

## 2022-05-29 DIAGNOSIS — Z86711 Personal history of pulmonary embolism: Secondary | ICD-10-CM | POA: Diagnosis not present

## 2022-05-29 DIAGNOSIS — Z8614 Personal history of Methicillin resistant Staphylococcus aureus infection: Secondary | ICD-10-CM | POA: Diagnosis not present

## 2022-05-29 DIAGNOSIS — Z7982 Long term (current) use of aspirin: Secondary | ICD-10-CM | POA: Diagnosis not present

## 2022-05-29 DIAGNOSIS — I2510 Atherosclerotic heart disease of native coronary artery without angina pectoris: Secondary | ICD-10-CM | POA: Diagnosis not present

## 2022-05-29 DIAGNOSIS — M1711 Unilateral primary osteoarthritis, right knee: Secondary | ICD-10-CM | POA: Diagnosis not present

## 2022-05-29 DIAGNOSIS — Z8673 Personal history of transient ischemic attack (TIA), and cerebral infarction without residual deficits: Secondary | ICD-10-CM | POA: Diagnosis not present

## 2022-05-29 DIAGNOSIS — G4700 Insomnia, unspecified: Secondary | ICD-10-CM | POA: Diagnosis not present

## 2022-05-29 DIAGNOSIS — E039 Hypothyroidism, unspecified: Secondary | ICD-10-CM | POA: Diagnosis not present

## 2022-05-29 DIAGNOSIS — I5033 Acute on chronic diastolic (congestive) heart failure: Secondary | ICD-10-CM | POA: Diagnosis not present

## 2022-05-29 DIAGNOSIS — Z9181 History of falling: Secondary | ICD-10-CM | POA: Diagnosis not present

## 2022-05-29 DIAGNOSIS — Z96652 Presence of left artificial knee joint: Secondary | ICD-10-CM | POA: Diagnosis not present

## 2022-05-29 DIAGNOSIS — Z87442 Personal history of urinary calculi: Secondary | ICD-10-CM | POA: Diagnosis not present

## 2022-05-29 DIAGNOSIS — Z952 Presence of prosthetic heart valve: Secondary | ICD-10-CM | POA: Diagnosis not present

## 2022-05-29 DIAGNOSIS — I4891 Unspecified atrial fibrillation: Secondary | ICD-10-CM | POA: Diagnosis not present

## 2022-05-29 DIAGNOSIS — K219 Gastro-esophageal reflux disease without esophagitis: Secondary | ICD-10-CM | POA: Diagnosis not present

## 2022-05-29 DIAGNOSIS — Z471 Aftercare following joint replacement surgery: Secondary | ICD-10-CM | POA: Diagnosis not present

## 2022-06-01 DIAGNOSIS — G4700 Insomnia, unspecified: Secondary | ICD-10-CM | POA: Diagnosis not present

## 2022-06-01 DIAGNOSIS — Z471 Aftercare following joint replacement surgery: Secondary | ICD-10-CM | POA: Diagnosis not present

## 2022-06-01 DIAGNOSIS — Z952 Presence of prosthetic heart valve: Secondary | ICD-10-CM | POA: Diagnosis not present

## 2022-06-01 DIAGNOSIS — I4891 Unspecified atrial fibrillation: Secondary | ICD-10-CM | POA: Diagnosis not present

## 2022-06-01 DIAGNOSIS — Z8614 Personal history of Methicillin resistant Staphylococcus aureus infection: Secondary | ICD-10-CM | POA: Diagnosis not present

## 2022-06-01 DIAGNOSIS — Z8673 Personal history of transient ischemic attack (TIA), and cerebral infarction without residual deficits: Secondary | ICD-10-CM | POA: Diagnosis not present

## 2022-06-01 DIAGNOSIS — Z9181 History of falling: Secondary | ICD-10-CM | POA: Diagnosis not present

## 2022-06-01 DIAGNOSIS — Z86711 Personal history of pulmonary embolism: Secondary | ICD-10-CM | POA: Diagnosis not present

## 2022-06-01 DIAGNOSIS — E039 Hypothyroidism, unspecified: Secondary | ICD-10-CM | POA: Diagnosis not present

## 2022-06-01 DIAGNOSIS — Z7982 Long term (current) use of aspirin: Secondary | ICD-10-CM | POA: Diagnosis not present

## 2022-06-01 DIAGNOSIS — Z87442 Personal history of urinary calculi: Secondary | ICD-10-CM | POA: Diagnosis not present

## 2022-06-01 DIAGNOSIS — I2510 Atherosclerotic heart disease of native coronary artery without angina pectoris: Secondary | ICD-10-CM | POA: Diagnosis not present

## 2022-06-01 DIAGNOSIS — M1711 Unilateral primary osteoarthritis, right knee: Secondary | ICD-10-CM | POA: Diagnosis not present

## 2022-06-01 DIAGNOSIS — K219 Gastro-esophageal reflux disease without esophagitis: Secondary | ICD-10-CM | POA: Diagnosis not present

## 2022-06-01 DIAGNOSIS — Z96652 Presence of left artificial knee joint: Secondary | ICD-10-CM | POA: Diagnosis not present

## 2022-06-01 DIAGNOSIS — I5033 Acute on chronic diastolic (congestive) heart failure: Secondary | ICD-10-CM | POA: Diagnosis not present

## 2022-06-03 DIAGNOSIS — M25662 Stiffness of left knee, not elsewhere classified: Secondary | ICD-10-CM | POA: Diagnosis not present

## 2022-06-03 DIAGNOSIS — M25562 Pain in left knee: Secondary | ICD-10-CM | POA: Diagnosis not present

## 2022-06-08 DIAGNOSIS — M25662 Stiffness of left knee, not elsewhere classified: Secondary | ICD-10-CM | POA: Diagnosis not present

## 2022-06-08 DIAGNOSIS — M25562 Pain in left knee: Secondary | ICD-10-CM | POA: Diagnosis not present

## 2022-06-10 DIAGNOSIS — M25662 Stiffness of left knee, not elsewhere classified: Secondary | ICD-10-CM | POA: Diagnosis not present

## 2022-06-10 DIAGNOSIS — M25562 Pain in left knee: Secondary | ICD-10-CM | POA: Diagnosis not present

## 2022-06-12 DIAGNOSIS — M25562 Pain in left knee: Secondary | ICD-10-CM | POA: Diagnosis not present

## 2022-06-12 DIAGNOSIS — M25662 Stiffness of left knee, not elsewhere classified: Secondary | ICD-10-CM | POA: Diagnosis not present

## 2022-06-15 DIAGNOSIS — M25662 Stiffness of left knee, not elsewhere classified: Secondary | ICD-10-CM | POA: Diagnosis not present

## 2022-06-15 DIAGNOSIS — M25562 Pain in left knee: Secondary | ICD-10-CM | POA: Diagnosis not present

## 2022-06-17 DIAGNOSIS — M25662 Stiffness of left knee, not elsewhere classified: Secondary | ICD-10-CM | POA: Diagnosis not present

## 2022-06-17 DIAGNOSIS — M25562 Pain in left knee: Secondary | ICD-10-CM | POA: Diagnosis not present

## 2022-06-18 DIAGNOSIS — M25562 Pain in left knee: Secondary | ICD-10-CM | POA: Diagnosis not present

## 2022-06-18 DIAGNOSIS — M25662 Stiffness of left knee, not elsewhere classified: Secondary | ICD-10-CM | POA: Diagnosis not present

## 2022-06-23 DIAGNOSIS — Z5189 Encounter for other specified aftercare: Secondary | ICD-10-CM | POA: Diagnosis not present

## 2022-06-24 DIAGNOSIS — M25562 Pain in left knee: Secondary | ICD-10-CM | POA: Diagnosis not present

## 2022-06-24 DIAGNOSIS — M25662 Stiffness of left knee, not elsewhere classified: Secondary | ICD-10-CM | POA: Diagnosis not present

## 2022-06-26 DIAGNOSIS — M25562 Pain in left knee: Secondary | ICD-10-CM | POA: Diagnosis not present

## 2022-06-26 DIAGNOSIS — M25662 Stiffness of left knee, not elsewhere classified: Secondary | ICD-10-CM | POA: Diagnosis not present

## 2022-06-29 DIAGNOSIS — M25562 Pain in left knee: Secondary | ICD-10-CM | POA: Diagnosis not present

## 2022-06-29 DIAGNOSIS — M25662 Stiffness of left knee, not elsewhere classified: Secondary | ICD-10-CM | POA: Diagnosis not present

## 2022-07-02 DIAGNOSIS — M25562 Pain in left knee: Secondary | ICD-10-CM | POA: Diagnosis not present

## 2022-07-02 DIAGNOSIS — M25662 Stiffness of left knee, not elsewhere classified: Secondary | ICD-10-CM | POA: Diagnosis not present

## 2022-07-05 DIAGNOSIS — Z79890 Hormone replacement therapy (postmenopausal): Secondary | ICD-10-CM | POA: Diagnosis not present

## 2022-07-05 DIAGNOSIS — Z885 Allergy status to narcotic agent status: Secondary | ICD-10-CM | POA: Diagnosis not present

## 2022-07-05 DIAGNOSIS — J90 Pleural effusion, not elsewhere classified: Secondary | ICD-10-CM | POA: Diagnosis not present

## 2022-07-05 DIAGNOSIS — I2510 Atherosclerotic heart disease of native coronary artery without angina pectoris: Secondary | ICD-10-CM | POA: Diagnosis not present

## 2022-07-05 DIAGNOSIS — E039 Hypothyroidism, unspecified: Secondary | ICD-10-CM | POA: Diagnosis not present

## 2022-07-05 DIAGNOSIS — S161XXA Strain of muscle, fascia and tendon at neck level, initial encounter: Secondary | ICD-10-CM | POA: Diagnosis not present

## 2022-07-05 DIAGNOSIS — J9811 Atelectasis: Secondary | ICD-10-CM | POA: Diagnosis not present

## 2022-07-05 DIAGNOSIS — M47812 Spondylosis without myelopathy or radiculopathy, cervical region: Secondary | ICD-10-CM | POA: Diagnosis not present

## 2022-07-05 DIAGNOSIS — Z952 Presence of prosthetic heart valve: Secondary | ICD-10-CM | POA: Diagnosis not present

## 2022-07-05 DIAGNOSIS — S29009A Unspecified injury of muscle and tendon of unspecified wall of thorax, initial encounter: Secondary | ICD-10-CM | POA: Diagnosis not present

## 2022-07-05 DIAGNOSIS — M542 Cervicalgia: Secondary | ICD-10-CM | POA: Diagnosis not present

## 2022-07-05 DIAGNOSIS — Z86711 Personal history of pulmonary embolism: Secondary | ICD-10-CM | POA: Diagnosis not present

## 2022-07-05 DIAGNOSIS — S20211A Contusion of right front wall of thorax, initial encounter: Secondary | ICD-10-CM | POA: Diagnosis not present

## 2022-07-06 DIAGNOSIS — S14109A Unspecified injury at unspecified level of cervical spinal cord, initial encounter: Secondary | ICD-10-CM | POA: Diagnosis not present

## 2022-07-15 DIAGNOSIS — M791 Myalgia, unspecified site: Secondary | ICD-10-CM | POA: Diagnosis not present

## 2022-07-15 DIAGNOSIS — K219 Gastro-esophageal reflux disease without esophagitis: Secondary | ICD-10-CM | POA: Diagnosis not present

## 2022-07-15 DIAGNOSIS — R29898 Other symptoms and signs involving the musculoskeletal system: Secondary | ICD-10-CM | POA: Diagnosis not present

## 2022-07-22 DIAGNOSIS — H0102B Squamous blepharitis left eye, upper and lower eyelids: Secondary | ICD-10-CM | POA: Diagnosis not present

## 2022-07-22 DIAGNOSIS — H35371 Puckering of macula, right eye: Secondary | ICD-10-CM | POA: Diagnosis not present

## 2022-07-22 DIAGNOSIS — H0102A Squamous blepharitis right eye, upper and lower eyelids: Secondary | ICD-10-CM | POA: Diagnosis not present

## 2022-07-22 DIAGNOSIS — Z961 Presence of intraocular lens: Secondary | ICD-10-CM | POA: Diagnosis not present

## 2022-07-25 ENCOUNTER — Ambulatory Visit
Admission: EM | Admit: 2022-07-25 | Discharge: 2022-07-25 | Disposition: A | Discharge status: Home / Self Care | Payer: Medicare Other

## 2022-07-25 DIAGNOSIS — U071 COVID-19: Secondary | ICD-10-CM | POA: Insufficient documentation

## 2022-07-25 DIAGNOSIS — H6122 Impacted cerumen, left ear: Secondary | ICD-10-CM | POA: Insufficient documentation

## 2022-07-25 DIAGNOSIS — J069 Acute upper respiratory infection, unspecified: Secondary | ICD-10-CM | POA: Diagnosis not present

## 2022-07-25 MED ORDER — CARBAMIDE PEROXIDE 6.5 % OT SOLN: 5.0000 [drp] | Freq: Two times a day (BID) | OTIC | 0 refills | Status: DC

## 2022-07-25 MED ORDER — PROMETHAZINE-DM 6.25-15 MG/5ML PO SYRP: 5.0000 mL | ORAL_SOLUTION | Freq: Four times a day (QID) | ORAL | 0 refills | Status: DC | PRN

## 2022-07-25 NOTE — ED Provider Notes (Addendum)
[facility]    CSN: [id number] Arrival date & time: 07/25/22  1212      History   Chief Complaint No chief complaint on file.   HPI Andrew Watson is a 82 y.o. male.   Patient presenting today with 1 day history of headache, chills, congestion, cough, malaise.  Denies fever, body aches, chest pain, shortness of breath, abdominal pain, nausea vomiting or diarrhea.  So far trying antihistamines, cold and congestion medications and cough syrup with only mild temporary benefit.  Wanting to be tested for COVID.  No known history of chronic pulmonary disease.    Past Medical History:  Diagnosis Date   Aortic valve stenosis    Arthritis    CAD (coronary artery disease) 05/07/2011   Mild atherosclerosis by cardiac catheterization February 2021   CHF (congestive heart failure) (HCC)    GERD (gastroesophageal reflux disease)    Heart murmur    History of atrial fibrillation 2013   Reportedly limited episode   History of kidney stones    History of MRSA infection    History of pancreatitis 2017   [facility]   History of pulmonary embolism 2017   History of TIA (transient ischemic attack)    November 2020   Hypothyroidism    S/P TAVR (transcatheter aortic valve replacement) 05/02/2019   s/p TAVR with a 26 mm Edwards Sapien 3 Ultra via the TF approach with Dr. Aundra Dubin & Dr. Laneta Simmers     Patient Active Problem List   Diagnosis Date Noted   OA (osteoarthritis) of knee 05/18/2022   Primary osteoarthritis of left knee 05/18/2022   GERD (gastroesophageal reflux disease)    Tiredness    Acute on chronic diastolic heart failure (HCC) 05/02/2019   S/P TAVR (transcatheter aortic valve replacement) 05/02/2019   History of TIA (transient ischemic attack)    Asymptomatic microscopic hematuria 04/07/2019   Calculus of kidney 04/07/2019   Aortic stenosis, severe    S/P  lap bilateral inguinal hernia repair Feb 2020 03/01/2018   Retroperitoneal fluid collection  04/20/2016   History of pulmonary embolism 08/12/2015   Duodenal stricture 08/10/2015   Gastric outlet obstruction 08/10/2015   H/O bypass gastrojejunostomy 08/10/2015   Pancreatic pseudocyst 08/01/2015   Insomnia 05/15/2011   Hypothyroidism 05/08/2011    Past Surgical History:  Procedure Laterality Date   BACK SURGERY     CARDIAC CATHETERIZATION     CATARACT EXTRACTION Bilateral    CHOLECYSTECTOMY     COLONOSCOPY  07/08/2011   Procedure: COLONOSCOPY;  Surgeon: Malissa Hippo, MD;  Location: [facility];  Service: Endoscopy;  Laterality: N/A;  930   CYSTOSCOPY WITH RETROGRADE PYELOGRAM, URETEROSCOPY AND STENT PLACEMENT Left 03/30/2019   Procedure: CYSTOSCOPY WITH LEFT  RETROGRADE , URETEROSCOPY HOLMIUM LASER AND STENT PLACEMENT;  Surgeon: Bjorn Pippin, MD;  Location: [facility]S;  Service: Urology;  Laterality: Left;   DUODENOTOMY  09/29/2016   repair of fistula, jejunal resection   INGUINAL HERNIA REPAIR N/A 03/01/2018   Procedure: LAPAROSCOPIC TEP BILATERAL INGUINAL HERNIA REPAIR;  Surgeon: Luretha Murphy, MD;  Location: [facility];  Service: General;  Laterality: N/A;   INTRAOPERATIVE TRANSTHORACIC ECHOCARDIOGRAM N/A 05/02/2019   Procedure: Intraoperative Transthoracic Echocardiogram;  Surgeon: Kathleene Hazel, MD;  Location: [facility] OR;  Service: Open Heart Surgery;  Laterality: N/A;   JEJUNOSTOMY FEEDING TUBE     KNEE ARTHROSCOPY Left    LEFT HEART CATHETERIZATION WITH CORONARY ANGIOGRAM N/A 05/07/2011   Procedure: LEFT HEART CATHETERIZATION WITH CORONARY  Rosalin Hawking;  Surgeon: Peter M Swaziland, MD;  Location: [facility] CATH LAB;  Service: Cardiovascular;  Laterality: N/A;   PEG TUBE PLACEMENT     RIGHT/LEFT HEART CATH AND CORONARY ANGIOGRAPHY N/A 02/28/2019   Procedure: RIGHT/LEFT HEART CATH AND CORONARY ANGIOGRAPHY;  Surgeon: Kathleene Hazel, MD;  Location: [facility] INVASIVE CV LAB;  Service: Cardiovascular;  Laterality: N/A;   TOTAL KNEE ARTHROPLASTY Left 05/18/2022   Procedure: TOTAL  KNEE ARTHROPLASTY;  Surgeon: Ollen Gross, MD;  Location: [facility] ORS;  Service: Orthopedics;  Laterality: Left;   TRANSCATHETER AORTIC VALVE REPLACEMENT, TRANSFEMORAL  05/02/2019   TRANSCATHETER AORTIC VALVE REPLACEMENT, TRANSFEMORAL N/A 05/02/2019   Procedure: TRANSCATHETER AORTIC VALVE REPLACEMENT, TRANSFEMORAL;  Surgeon: Kathleene Hazel, MD;  Location: [facility] OR;  Service: Open Heart Surgery;  Laterality: N/A;   VASECTOMY     WISDOM TOOTH EXTRACTION         Home Medications    Prior to Admission medications   Medication Sig Start Date End Date Taking? Authorizing Provider  aspirin EC 81 MG tablet Take 81 mg by mouth daily. Swallow whole.   Yes [provider]  carbamide peroxide (DEBROX) 6.5 % OTIC solution Place 5 drops into the right ear 2 (two) times daily. 07/25/22  Yes Particia Nearing, PA-C  promethazine-dextromethorphan (PROMETHAZINE-DM) 6.25-15 MG/5ML syrup Take 5 mLs by mouth 4 (four) times daily as needed. 07/25/22  Yes Particia Nearing, PA-C  levothyroxine (SYNTHROID) 175 MCG tablet Take 175 mcg by mouth daily before breakfast.    [provider]  methocarbamol (ROBAXIN) 500 MG tablet Take 1 tablet (500 mg total) by mouth every 6 (six) hours as needed for muscle spasms. 05/19/22   Edmisten, Kristie L, PA  oxyCODONE (OXY IR/ROXICODONE) 5 MG immediate release tablet Take 1-2 tablets (5-10 mg total) by mouth every 6 (six) hours as needed for severe pain. 05/19/22   Edmisten, Lyn Hollingshead, PA  traMADol (ULTRAM) 50 MG tablet Take 1-2 tablets (50-100 mg total) by mouth every 6 (six) hours as needed for moderate pain. 05/19/22   Edmisten, Lyn Hollingshead, PA    Family History Family History  Problem Relation Age of Onset   Arthritis Mother    Other Brother        staph infection    Social History Social History   Tobacco Use   Smoking status: Never   Smokeless tobacco: Never  Vaping Use   Vaping Use: Never used  Substance Use Topics   Alcohol use: No     Comment: none since Jun 26, 2015   Drug use: No     Allergies   Patient has no known allergies.   Review of Systems Review of Systems Per HPI  Physical Exam Triage Vital Signs ED Triage Vitals [07/25/22 1220]  Enc Vitals Group     BP (!) 140/64     Pulse Rate (!) 101     Resp 20     Temp 98.2 F (36.8 C)     Temp Source Oral     SpO2 93 %     Weight      Height      Head Circumference      Peak Flow      Pain Score 0     Pain Loc      Pain Edu?      Excl. in GC?    No data found.  Updated Vital Signs BP (!) 140/64 (BP Location: Right Arm)   Pulse (!) 101  Temp 98.2 F (36.8 C) (Oral)   Resp 20   SpO2 93%   Visual Acuity Right Eye Distance:   Left Eye Distance:   Bilateral Distance:    Right Eye Near:   Left Eye Near:    Bilateral Near:     Physical Exam Vitals and nursing note reviewed.  Constitutional:      Appearance: He is well-developed.  HENT:     Head: Atraumatic.     Right Ear: Tympanic membrane and external ear normal.     Left Ear: There is impacted cerumen.     Nose: Rhinorrhea present.     Mouth/Throat:     Mouth: Mucous membranes are moist.     Pharynx: Oropharynx is clear. No oropharyngeal exudate or posterior oropharyngeal erythema.  Eyes:     Conjunctiva/sclera: Conjunctivae normal.     Pupils: Pupils are equal, round, and reactive to light.  Cardiovascular:     Rate and Rhythm: Normal rate and regular rhythm.  Pulmonary:     Effort: Pulmonary effort is normal. No respiratory distress.     Breath sounds: Wheezing present. No rales.  Musculoskeletal:        General: Normal range of motion.     Cervical back: Normal range of motion and neck supple.  Lymphadenopathy:     Cervical: No cervical adenopathy.  Skin:    General: Skin is warm and dry.  Neurological:     Mental Status: He is alert and oriented to person, place, and time.  Psychiatric:        Behavior: Behavior normal.      UC Treatments / Results   Labs (all labs ordered are listed, but only abnormal results are displayed) Labs Reviewed  SARS CORONAVIRUS 2 (TAT 6-24 HRS)    EKG   Radiology No results found.  Procedures Procedures (including critical care time)  Medications Ordered in UC Medications - No data to display  Initial Impression / Assessment and Plan / UC Course  I have reviewed the triage vital signs and the nursing notes.  Pertinent labs & imaging results that were available during my care of the patient were reviewed by me and considered in my medical decision making (see chart for details).     Ear lavage performed with warm water and peroxide to the left ear to remove cerumen impaction with minimal amounts removed.  Procedure was then terminated due to irritation and Debrox drops sent with instructions on use.  Suspect viral upper respiratory infection causing symptoms.  COVID testing pending, will treat symptomatically with Phenergan DM, continued allergy and cold and congestion medications and supportive home care.  Return for any worsening symptoms.  Final Clinical Impressions(s) / UC Diagnoses   Final diagnoses:  Viral URI with cough  Impacted cerumen of left ear   Discharge Instructions   None    ED Prescriptions     Medication Sig Dispense Auth. Provider   promethazine-dextromethorphan (PROMETHAZINE-DM) 6.25-15 MG/5ML syrup Take 5 mLs by mouth 4 (four) times daily as needed. 100 mL Particia Nearing, PA-C   carbamide peroxide (DEBROX) 6.5 % OTIC solution Place 5 drops into the right ear 2 (two) times daily. 15 mL Particia Nearing, [place]      PDMP not reviewed this encounter.   Particia Nearing, [place] 07/25/22 1249    Particia Nearing, [place] 07/25/22 1324

## 2022-07-25 NOTE — ED Triage Notes (Signed)
Pt reports he has a headache, chills, nasal congestion, and a cough x 1 day. Pt took claritin and cold meds

## 2022-07-26 ENCOUNTER — Telehealth: Payer: Self-pay

## 2022-07-26 ENCOUNTER — Telehealth: Payer: Self-pay | Admitting: Family Medicine

## 2022-07-26 LAB — SARS CORONAVIRUS 2 (TAT 6-24 HRS): SARS Coronavirus 2: POSITIVE — AB

## 2022-07-26 MED ORDER — PAXLOVID (300/100) 20 X 150 MG & 10 X 100MG PO TBPK: 3.0000 | ORAL_TABLET | Freq: Two times a day (BID) | ORAL | 0 refills | Status: AC

## 2022-07-26 NOTE — Telephone Encounter (Signed)
Patient tested positive for COVID, requesting Paxlovid.  It appears he is a reasonable candidate so Paxlovid sent to pharmacy of choice

## 2022-07-26 NOTE — Telephone Encounter (Signed)
Pts daughter called wanting the results for his covid test performed x 1 day. Daughter was verified as a pt contact. Informed pts' daughter that He did test positive for Covid. Pts daughter Azucena Fallen ask for a script of Paxlovid for her father who is not doing well. Pts daughter stated he is "out of it" and couldn't be contacted. After provider review, it was determined that he was a candidate for the paxlovid.   Pharmacy was verified and script has been sent to [facility] [address].

## 2022-08-05 DIAGNOSIS — M542 Cervicalgia: Secondary | ICD-10-CM | POA: Diagnosis not present

## 2022-08-06 DIAGNOSIS — H35341 Macular cyst, hole, or pseudohole, right eye: Secondary | ICD-10-CM | POA: Diagnosis not present

## 2022-08-06 DIAGNOSIS — H35371 Puckering of macula, right eye: Secondary | ICD-10-CM | POA: Diagnosis not present

## 2022-08-06 DIAGNOSIS — Z961 Presence of intraocular lens: Secondary | ICD-10-CM | POA: Diagnosis not present

## 2022-08-06 DIAGNOSIS — H43822 Vitreomacular adhesion, left eye: Secondary | ICD-10-CM | POA: Diagnosis not present

## 2022-08-10 DIAGNOSIS — M542 Cervicalgia: Secondary | ICD-10-CM | POA: Diagnosis not present

## 2022-08-12 DIAGNOSIS — M542 Cervicalgia: Secondary | ICD-10-CM | POA: Diagnosis not present

## 2022-08-17 DIAGNOSIS — K219 Gastro-esophageal reflux disease without esophagitis: Secondary | ICD-10-CM | POA: Diagnosis not present

## 2022-08-17 DIAGNOSIS — I700 Atherosclerosis of aorta: Secondary | ICD-10-CM | POA: Diagnosis not present

## 2022-08-17 DIAGNOSIS — Z23 Encounter for immunization: Secondary | ICD-10-CM | POA: Diagnosis not present

## 2022-08-17 DIAGNOSIS — E7800 Pure hypercholesterolemia, unspecified: Secondary | ICD-10-CM | POA: Diagnosis not present

## 2022-08-17 DIAGNOSIS — Z1331 Encounter for screening for depression: Secondary | ICD-10-CM | POA: Diagnosis not present

## 2022-08-17 DIAGNOSIS — E039 Hypothyroidism, unspecified: Secondary | ICD-10-CM | POA: Diagnosis not present

## 2022-08-17 DIAGNOSIS — Z0000 Encounter for general adult medical examination without abnormal findings: Secondary | ICD-10-CM | POA: Diagnosis not present

## 2022-08-17 DIAGNOSIS — E538 Deficiency of other specified B group vitamins: Secondary | ICD-10-CM | POA: Diagnosis not present

## 2022-08-17 DIAGNOSIS — Z1159 Encounter for screening for other viral diseases: Secondary | ICD-10-CM | POA: Diagnosis not present

## 2022-08-17 DIAGNOSIS — M542 Cervicalgia: Secondary | ICD-10-CM | POA: Diagnosis not present

## 2022-08-18 DIAGNOSIS — M542 Cervicalgia: Secondary | ICD-10-CM | POA: Diagnosis not present

## 2022-08-19 DIAGNOSIS — M542 Cervicalgia: Secondary | ICD-10-CM | POA: Diagnosis not present

## 2022-08-24 DIAGNOSIS — M542 Cervicalgia: Secondary | ICD-10-CM | POA: Diagnosis not present

## 2022-08-26 DIAGNOSIS — M542 Cervicalgia: Secondary | ICD-10-CM | POA: Diagnosis not present

## 2022-09-09 DIAGNOSIS — H35371 Puckering of macula, right eye: Secondary | ICD-10-CM | POA: Diagnosis not present

## 2022-09-16 DIAGNOSIS — H35371 Puckering of macula, right eye: Secondary | ICD-10-CM | POA: Diagnosis not present

## 2022-09-16 DIAGNOSIS — H43822 Vitreomacular adhesion, left eye: Secondary | ICD-10-CM | POA: Diagnosis not present

## 2022-09-21 DIAGNOSIS — I2510 Atherosclerotic heart disease of native coronary artery without angina pectoris: Secondary | ICD-10-CM | POA: Diagnosis not present

## 2022-09-21 DIAGNOSIS — Z952 Presence of prosthetic heart valve: Secondary | ICD-10-CM | POA: Diagnosis not present

## 2022-09-21 DIAGNOSIS — E039 Hypothyroidism, unspecified: Secondary | ICD-10-CM | POA: Diagnosis not present

## 2022-09-21 DIAGNOSIS — Z8679 Personal history of other diseases of the circulatory system: Secondary | ICD-10-CM | POA: Diagnosis not present

## 2022-10-05 DIAGNOSIS — B9689 Other specified bacterial agents as the cause of diseases classified elsewhere: Secondary | ICD-10-CM | POA: Diagnosis not present

## 2022-10-05 DIAGNOSIS — L0212 Furuncle of neck: Secondary | ICD-10-CM | POA: Diagnosis not present

## 2022-10-05 DIAGNOSIS — Z1283 Encounter for screening for malignant neoplasm of skin: Secondary | ICD-10-CM | POA: Diagnosis not present

## 2022-10-05 DIAGNOSIS — D225 Melanocytic nevi of trunk: Secondary | ICD-10-CM | POA: Diagnosis not present

## 2022-10-13 DIAGNOSIS — K080 Exfoliation of teeth due to systemic causes: Secondary | ICD-10-CM | POA: Diagnosis not present

## 2022-10-20 DIAGNOSIS — K080 Exfoliation of teeth due to systemic causes: Secondary | ICD-10-CM | POA: Diagnosis not present

## 2022-12-09 ENCOUNTER — Ambulatory Visit
Admission: EM | Admit: 2022-12-09 | Discharge: 2022-12-09 | Disposition: A | Discharge status: Home / Self Care | Payer: Medicare Other | Attending: Nurse Practitioner | Admitting: Nurse Practitioner

## 2022-12-09 ENCOUNTER — Ambulatory Visit: Payer: Medicare Other

## 2022-12-09 DIAGNOSIS — R059 Cough, unspecified: Secondary | ICD-10-CM | POA: Diagnosis not present

## 2022-12-09 DIAGNOSIS — R051 Acute cough: Secondary | ICD-10-CM | POA: Diagnosis not present

## 2022-12-09 MED ORDER — AZITHROMYCIN 250 MG PO TABS: ORAL_TABLET | ORAL | 0 refills | Status: DC

## 2022-12-09 NOTE — ED Provider Notes (Signed)
[facility]    CSN: [id number] Arrival date & time: 12/09/22  1242      History   Chief Complaint Chief Complaint  Patient presents with   Cough    HPI Andrew Watson is a 82 y.o. male.   Patient presents today with 2-week history of dry and congested cough with mucus that will not break up.  He also endorses runny nose, decreased appetite, and fatigue.  No fever, bodyaches or chills, shortness of breath or chest pain, sore throat, sinus pressure or headache, ear pain, abdominal pain, nausea/vomiting, or diarrhea.  No known sick contacts.  Recently returned from a trip to [facility] celebrating grandson's 21st birthday.  Has taken Mucinex and over-the-counter cough syrup with minimal improvement.  Denies history of chronic lung disease.  Tells me that azithromycin typically works pretty well for him.    Past Medical History:  Diagnosis Date   Aortic valve stenosis    Arthritis    CAD (coronary artery disease) 05/07/2011   Mild atherosclerosis by cardiac catheterization February 2021   CHF (congestive heart failure) (HCC)    GERD (gastroesophageal reflux disease)    Heart murmur    History of atrial fibrillation 2013   Reportedly limited episode   History of kidney stones    History of MRSA infection    History of pancreatitis 2017   [facility]   History of pulmonary embolism 2017   History of TIA (transient ischemic attack)    November 2020   Hypothyroidism    S/P TAVR (transcatheter aortic valve replacement) 05/02/2019   s/p TAVR with a 26 mm Edwards Sapien 3 Ultra via the TF approach with Dr. Aundra Dubin & Dr. Laneta Simmers     Patient Active Problem List   Diagnosis Date Noted   OA (osteoarthritis) of knee 05/18/2022   Primary osteoarthritis of left knee 05/18/2022   GERD (gastroesophageal reflux disease)    Tiredness    Acute on chronic diastolic heart failure (HCC) 05/02/2019   S/P TAVR (transcatheter aortic valve replacement) 05/02/2019   History  of TIA (transient ischemic attack)    Asymptomatic microscopic hematuria 04/07/2019   Calculus of kidney 04/07/2019   Aortic stenosis, severe    S/P  lap bilateral inguinal hernia repair Feb 2020 03/01/2018   Retroperitoneal fluid collection 04/20/2016   History of pulmonary embolism 08/12/2015   Duodenal stricture 08/10/2015   Gastric outlet obstruction 08/10/2015   H/O bypass gastrojejunostomy 08/10/2015   Pancreatic pseudocyst 08/01/2015   Insomnia 05/15/2011   Hypothyroidism 05/08/2011    Past Surgical History:  Procedure Laterality Date   BACK SURGERY     CARDIAC CATHETERIZATION     CATARACT EXTRACTION Bilateral    CHOLECYSTECTOMY     COLONOSCOPY  07/08/2011   Procedure: COLONOSCOPY;  Surgeon: Malissa Hippo, MD;  [facility];  Service: Endoscopy;  Laterality: N/A;  930   CYSTOSCOPY WITH RETROGRADE PYELOGRAM, URETEROSCOPY AND STENT PLACEMENT Left 03/30/2019   Procedure: CYSTOSCOPY WITH LEFT  RETROGRADE , URETEROSCOPY HOLMIUM LASER AND STENT PLACEMENT;  Surgeon: Bjorn Pippin, MD;  Location: [facility];  Service: Urology;  Laterality: Left;   DUODENOTOMY  09/29/2016   repair of fistula, jejunal resection   INGUINAL HERNIA REPAIR N/A 03/01/2018   Procedure: LAPAROSCOPIC TEP BILATERAL INGUINAL HERNIA REPAIR;  Surgeon: Luretha Murphy, MD;  Location: [facility];  Service: General;  Laterality: N/A;   INTRAOPERATIVE TRANSTHORACIC ECHOCARDIOGRAM N/A 05/02/2019   Procedure: Intraoperative Transthoracic Echocardiogram;  Surgeon: Kathleene Hazel,  MD;  Location: [facility];  Service: Open Heart Surgery;  Laterality: N/A;   JEJUNOSTOMY FEEDING TUBE     KNEE ARTHROSCOPY Left    LEFT HEART CATHETERIZATION WITH CORONARY ANGIOGRAM N/A 05/07/2011   Procedure: LEFT HEART CATHETERIZATION WITH CORONARY ANGIOGRAM;  Surgeon: Peter M Swaziland, MD;  Location[facility] CATH LAB;  Service: Cardiovascular;  Laterality: N/A;   PEG TUBE PLACEMENT     RIGHT/LEFT HEART CATH AND CORONARY ANGIOGRAPHY N/A  02/28/2019   Procedure: RIGHT/LEFT HEART CATH AND CORONARY ANGIOGRAPHY;  Surgeon: Kathleene Hazel, MD;  Location: [facility] INVASIVE CV LAB;  Service: Cardiovascular;  Laterality: N/A;   TOTAL KNEE ARTHROPLASTY Left 05/18/2022   Procedure: TOTAL KNEE ARTHROPLASTY;  Surgeon: Ollen Gross, MD;  Location: [facility];  Service: Orthopedics;  Laterality: Left;   TRANSCATHETER AORTIC VALVE REPLACEMENT, TRANSFEMORAL  05/02/2019   TRANSCATHETER AORTIC VALVE REPLACEMENT, TRANSFEMORAL N/A 05/02/2019   Procedure: TRANSCATHETER AORTIC VALVE REPLACEMENT, TRANSFEMORAL;  Surgeon: Kathleene Hazel, MD;  Location: [facility];  Service: Open Heart Surgery;  Laterality: N/A;   VASECTOMY     WISDOM TOOTH EXTRACTION         Home Medications    Prior to Admission medications   Medication Sig Start Date End Date Taking? Authorizing Provider  azithromycin (ZITHROMAX) 250 MG tablet Take (2) tablets by mouth on day 1, then take (1) tablet by mouth on days 2-5. 12/09/22  Yes Valentino Nose, NP  aspirin EC 81 MG tablet Take 81 mg by mouth daily. Swallow whole.    [provider]  levothyroxine (SYNTHROID) 175 MCG tablet Take 175 mcg by mouth daily before breakfast.    [provider]  methocarbamol (ROBAXIN) 500 MG tablet Take 1 tablet (500 mg total) by mouth every 6 (six) hours as needed for muscle spasms. 05/19/22   Edmisten, Lyn Hollingshead, PA    Family History Family History  Problem Relation Age of Onset   Arthritis Mother    Other Brother        staph infection    Social History Social History   Tobacco Use   Smoking status: Never   Smokeless tobacco: Never  Vaping Use   Vaping status: Never Used  Substance Use Topics   Alcohol use: No    Comment: none since Jun 26, 2015   Drug use: No     Allergies   Patient has no known allergies.   Review of Systems Review of Systems Per HPI  Physical Exam Triage Vital Signs ED Triage Vitals  Encounter Vitals Group     BP  12/09/22 1323 123/78     Systolic BP Percentile --      Diastolic BP Percentile --      Pulse Rate 12/09/22 1323 87     Resp 12/09/22 1323 16     Temp 12/09/22 1323 (!) 97.5 F (36.4 C)     Temp Source 12/09/22 1323 Oral     SpO2 12/09/22 1323 95 %     Weight --      Height --      Head Circumference --      Peak Flow --      Pain Score 12/09/22 1325 0     Pain Loc --      Pain Education --      Exclude from Growth Chart --    No data found.  Updated Vital Signs BP 123/78 (BP Location: Left Arm)   Pulse 87   Temp (!) 97.5 F (  36.4 C) (Oral)   Resp 16   SpO2 95%   Visual Acuity Right Eye Distance:   Left Eye Distance:   Bilateral Distance:    Right Eye Near:   Left Eye Near:    Bilateral Near:     Physical Exam Vitals and nursing note reviewed.  Constitutional:      General: He is not in acute distress.    Appearance: Normal appearance. He is not ill-appearing or toxic-appearing.  HENT:     Head: Normocephalic and atraumatic.     Right Ear: Tympanic membrane, ear canal and external ear normal.     Left Ear: Tympanic membrane, ear canal and external ear normal.     Nose: No congestion or rhinorrhea.     Mouth/Throat:     Mouth: Mucous membranes are moist.     Pharynx: Oropharynx is clear. No oropharyngeal exudate or posterior oropharyngeal erythema.  Eyes:     General: No scleral icterus.    Extraocular Movements: Extraocular movements intact.  Cardiovascular:     Rate and Rhythm: Normal rate and regular rhythm.     Heart sounds: Normal heart sounds.  Pulmonary:     Effort: Pulmonary effort is normal. No respiratory distress.     Breath sounds: Normal breath sounds. No wheezing, rhonchi or rales.  Musculoskeletal:     Cervical back: Normal range of motion and neck supple.  Lymphadenopathy:     Cervical: No cervical adenopathy.  Skin:    General: Skin is warm and dry.     Coloration: Skin is not jaundiced or pale.     Findings: No erythema or rash.   Neurological:     Mental Status: He is alert and oriented to person, place, and time.  Psychiatric:        Behavior: Behavior is cooperative.      UC Treatments / Results  Labs (all labs ordered are listed, but only abnormal results are displayed) Labs Reviewed - No data to display  EKG   Radiology No results found.  Procedures Procedures (including critical care time)  Medications Ordered in UC Medications - No data to display  Initial Impression / Assessment and Plan / UC Course  I have reviewed the triage vital signs and the nursing notes.  Pertinent labs & imaging results that were available during my care of the patient were reviewed by me and considered in my medical decision making (see chart for details).   Patient is well-appearing, normotensive, afebrile, not tachycardic, not tachypneic, oxygenating well on room air.    1. Acute cough No red flags; vitals are stable and lungs are clear to auscultation from what I can hear Chest x-ray pending given length of coughing to evaluate for pneumonia In meantime, will treat patient with azithromycin as this seems to work well for him, continue over-the-counter cough suppressant medication as needed Will contact patient if chest x-ray does infection or pneumonia and will add Augmentin Strict ER precautions discussed with patient  The patient was given the opportunity to ask questions.  All questions answered to their satisfaction.  The patient is in agreement to this plan.    Final Clinical Impressions(s) / UC Diagnoses   Final diagnoses:  Acute cough     Discharge Instructions      As we discussed, I will contact you later today if we need to add in another antibiotic based on the chest x-ray results.  In the meantime, start taking the azithromycin as prescribed.  You  can continue the cough syrup at nighttime as needed for the cough.  Make sure you are drinking plenty of fluids.  Seek care if symptoms do not  improve with treatment.    ED Prescriptions     Medication Sig Dispense Auth. Provider   azithromycin (ZITHROMAX) 250 MG tablet Take (2) tablets by mouth on day 1, then take (1) tablet by mouth on days 2-5. 6 tablet Valentino Nose, NP      PDMP not reviewed this encounter.   Valentino Nose, NP 12/09/22 [phone]

## 2022-12-09 NOTE — ED Triage Notes (Signed)
Pt states cough  and runny nose for the past 2 weeks.

## 2022-12-09 NOTE — Discharge Instructions (Addendum)
As we discussed, I will contact you later today if we need to add in another antibiotic based on the chest x-ray results.  In the meantime, start taking the azithromycin as prescribed.  You can continue the cough syrup at nighttime as needed for the cough.  Make sure you are drinking plenty of fluids.  Seek care if symptoms do not improve with treatment.

## 2022-12-16 DIAGNOSIS — H35371 Puckering of macula, right eye: Secondary | ICD-10-CM | POA: Diagnosis not present

## 2022-12-16 DIAGNOSIS — H43822 Vitreomacular adhesion, left eye: Secondary | ICD-10-CM | POA: Diagnosis not present

## 2022-12-16 DIAGNOSIS — H04123 Dry eye syndrome of bilateral lacrimal glands: Secondary | ICD-10-CM | POA: Diagnosis not present

## 2022-12-16 DIAGNOSIS — H35341 Macular cyst, hole, or pseudohole, right eye: Secondary | ICD-10-CM | POA: Diagnosis not present

## 2022-12-28 DIAGNOSIS — I2510 Atherosclerotic heart disease of native coronary artery without angina pectoris: Secondary | ICD-10-CM | POA: Diagnosis not present

## 2023-01-29 DIAGNOSIS — G629 Polyneuropathy, unspecified: Secondary | ICD-10-CM | POA: Diagnosis not present

## 2023-02-11 DIAGNOSIS — H35371 Puckering of macula, right eye: Secondary | ICD-10-CM | POA: Diagnosis not present

## 2023-02-11 DIAGNOSIS — H35341 Macular cyst, hole, or pseudohole, right eye: Secondary | ICD-10-CM | POA: Diagnosis not present

## 2023-02-11 DIAGNOSIS — H43822 Vitreomacular adhesion, left eye: Secondary | ICD-10-CM | POA: Diagnosis not present

## 2023-02-11 DIAGNOSIS — G453 Amaurosis fugax: Secondary | ICD-10-CM | POA: Diagnosis not present

## 2023-02-12 DIAGNOSIS — G453 Amaurosis fugax: Secondary | ICD-10-CM | POA: Diagnosis not present

## 2023-02-12 DIAGNOSIS — K1379 Other lesions of oral mucosa: Secondary | ICD-10-CM | POA: Diagnosis not present

## 2023-02-12 DIAGNOSIS — Z952 Presence of prosthetic heart valve: Secondary | ICD-10-CM | POA: Diagnosis not present

## 2023-02-12 DIAGNOSIS — G629 Polyneuropathy, unspecified: Secondary | ICD-10-CM | POA: Diagnosis not present

## 2023-02-18 ENCOUNTER — Encounter: Payer: Self-pay | Admitting: *Deleted

## 2023-02-18 ENCOUNTER — Ambulatory Visit: Payer: Medicare Other | Admitting: Diagnostic Neuroimaging

## 2023-02-18 ENCOUNTER — Encounter: Payer: Self-pay | Admitting: Diagnostic Neuroimaging

## 2023-02-18 VITALS — BP 117/67 | HR 77 | Ht 68.0 in | Wt 220.0 lb

## 2023-02-18 DIAGNOSIS — G459 Transient cerebral ischemic attack, unspecified: Secondary | ICD-10-CM

## 2023-02-18 MED ORDER — CLOPIDOGREL BISULFATE 75 MG PO TABS: 75.0000 mg | ORAL_TABLET | Freq: Every day | ORAL | 11 refills | Status: AC

## 2023-02-18 NOTE — Patient Instructions (Signed)
  TIA x 3 (transient right eye amaurosis fugax lasting 15-30 seconds each; ~2016, Nov 2020, Jan 2025)  - check MRI, MRA head, carotid u/s  - check cardiac monitoring (for consideration of anticoagulation for prior atrial fibrillation)  - stop aspirin 81mg  --> start plavix 75mg  daily  - stroke risk factor reduction and education reviewed

## 2023-02-18 NOTE — Progress Notes (Signed)
[facility]  PATIENT: Andrew Watson DOB: [birth year]  REFERRING CLINICIAN: Tally Joe, MD HISTORY FROM: patient and daughter REASON FOR VISIT: new consult    HISTORICAL  CHIEF COMPLAINT:  Chief Complaint  Patient presents with   New Patient (Initial Visit)    Pt in 6 with daughter Pt states last week while watching tv right eye was glazed over pt states saw red for 30 seconds .     HISTORY OF PRESENT ILLNESS:   UPDATE (02/18/23, VRP): Since last visit, doing well, and had TAVR in 2021. Then last week had transient right eye vision loss x 15-20 seconds, and other vision was red tinted. Then vision returns from top to bottom. No other symptoms. Went to retina specialist and PCP.   PRIOR HPI (02/01/19): 83 year old male with aortic stenosis, atrial fibrillation, presented to hospital on 12/06/2018 for transient right eye visual loss (partial).  This affected his inferior visual field in the right side.  Symptoms lasted for 20 or 30 seconds.  Patient had a similar episode in 2013 but he did not seek medical attention for this.  Patient to the hospital for evaluation was diagnosed with possible TIA.  TIA stroke work-up was completed.  Patient was discharged on aspirin Plavix for 3 weeks, to be followed by aspirin alone.  Patient also was found to have severe aortic stenosis and followed up with cardiology.  Since that time patient is doing well.  No further recurrence of symptoms.  He has continued on dual antiplatelet therapy as he did not remember instructions to reduce to single antiplatelet therapy after 3 weeks.   REVIEW OF SYSTEMS: Full 14 system review of systems performed and negative with exception of: As per HPI.  ALLERGIES: No Known Allergies  HOME MEDICATIONS: Outpatient Medications Prior to Visit  Medication Sig Dispense Refill   azithromycin (ZITHROMAX) 250 MG tablet Take (2) tablets by mouth on day 1, then take (1) tablet by mouth on days 2-5. 6  tablet 0   gabapentin (NEURONTIN) 100 MG capsule Take 100 mg by mouth at bedtime.     levothyroxine (SYNTHROID) 175 MCG tablet Take 175 mcg by mouth daily before breakfast.     methocarbamol (ROBAXIN) 500 MG tablet Take 1 tablet (500 mg total) by mouth every 6 (six) hours as needed for muscle spasms. 40 tablet 0   aspirin EC 81 MG tablet Take 325 mg by mouth daily. Swallow whole.     No facility-administered medications prior to visit.    PAST MEDICAL HISTORY: Past Medical History:  Diagnosis Date   Aortic valve stenosis    Arthritis    CAD (coronary artery disease) 05/07/2011   Mild atherosclerosis by cardiac catheterization February 2021   CHF (congestive heart failure) (HCC)    GERD (gastroesophageal reflux disease)    Heart murmur    History of atrial fibrillation 2013   Reportedly limited episode   History of kidney stones    History of MRSA infection    History of pancreatitis 2017   [facility]   History of pulmonary embolism 2017   History of TIA (transient ischemic attack)    November 2020   Hypothyroidism    S/P TAVR (transcatheter aortic valve replacement) 05/02/2019   s/p TAVR with a 26 mm Edwards Sapien 3 Ultra via the TF approach with Dr. Aundra Dubin & Dr. Laneta Simmers     PAST SURGICAL HISTORY: Past Surgical History:  Procedure Laterality Date   BACK SURGERY  CARDIAC CATHETERIZATION     CATARACT EXTRACTION Bilateral    CHOLECYSTECTOMY     COLONOSCOPY  07/08/2011   Procedure: COLONOSCOPY;  Surgeon: Malissa Hippo, MD;  [facility];  Service: Endoscopy;  Laterality: N/A;  930   CYSTOSCOPY WITH RETROGRADE PYELOGRAM, URETEROSCOPY AND STENT PLACEMENT Left 03/30/2019   Procedure: CYSTOSCOPY WITH LEFT  RETROGRADE , URETEROSCOPY HOLMIUM LASER AND STENT PLACEMENT;  Surgeon: Bjorn Pippin, MD;  Location: [facility];  Service: Urology;  Laterality: Left;   DUODENOTOMY  09/29/2016   repair of fistula, jejunal resection   INGUINAL HERNIA REPAIR N/A 03/01/2018    Procedure: LAPAROSCOPIC TEP BILATERAL INGUINAL HERNIA REPAIR;  Surgeon: Luretha Murphy, MD;  Location: [facility];  Service: General;  Laterality: N/A;   INTRAOPERATIVE TRANSTHORACIC ECHOCARDIOGRAM N/A 05/02/2019   Procedure: Intraoperative Transthoracic Echocardiogram;  Surgeon: Kathleene Hazel, MD;  Location: [facility] OR;  Service: Open Heart Surgery;  Laterality: N/A;   JEJUNOSTOMY FEEDING TUBE     KNEE ARTHROSCOPY Left    LEFT HEART CATHETERIZATION WITH CORONARY ANGIOGRAM N/A 05/07/2011   Procedure: LEFT HEART CATHETERIZATION WITH CORONARY ANGIOGRAM;  Surgeon: Peter M Swaziland, MD;  Location: [facility] CATH LAB;  Service: Cardiovascular;  Laterality: N/A;   PEG TUBE PLACEMENT     RIGHT/LEFT HEART CATH AND CORONARY ANGIOGRAPHY N/A 02/28/2019   Procedure: RIGHT/LEFT HEART CATH AND CORONARY ANGIOGRAPHY;  Surgeon: Kathleene Hazel, MD;  [facility];  Service: Cardiovascular;  Laterality: N/A;   TOTAL KNEE ARTHROPLASTY Left 05/18/2022   Procedure: TOTAL KNEE ARTHROPLASTY;  Surgeon: Ollen Gross, MD;  Location: [facility];  Service: Orthopedics;  Laterality: Left;   TRANSCATHETER AORTIC VALVE REPLACEMENT, TRANSFEMORAL  05/02/2019   TRANSCATHETER AORTIC VALVE REPLACEMENT, TRANSFEMORAL N/A 05/02/2019   Procedure: TRANSCATHETER AORTIC VALVE REPLACEMENT, TRANSFEMORAL;  Surgeon: Kathleene Hazel, MD;  Location: [facility] OR;  Service: Open Heart Surgery;  Laterality: N/A;   VASECTOMY     WISDOM TOOTH EXTRACTION      FAMILY HISTORY: Family History  Problem Relation Age of Onset   Arthritis Mother    Other Brother        staph infection   Stroke Neg Hx     SOCIAL HISTORY: Social History   Socioeconomic History   Marital status: Widowed    Spouse name: [name]   Number of children: Not on file   Years of education: Not on file   Highest education level: High school graduate  Occupational History    Comment: retired   Tobacco Use   Smoking status: Never   Smokeless tobacco:  Never  Vaping Use   Vaping status: Never Used  Substance and Sexual Activity   Alcohol use: No    Comment: none since Jun 26, 2015   Drug use: No   Sexual activity: Not on file  Other Topics Concern   Not on file  Social History Narrative   Lives with lives alone    Caffeine- coffee 2 c daily, usually decaf   Retired    Chief Executive Officer [employer] investment banker Strain: Not on file  Food Insecurity: No Food Insecurity (05/18/2022)   Hunger Vital Sign    Worried About Running Out of Food in the Last Year: Never true    Ran Out of Food in the Last Year: Never true  Transportation Needs: No Transportation Needs (05/18/2022)   PRAPARE - Administrator, Civil Service (Medical): No    Lack of Transportation (Non-Medical): No  Physical  Activity: Not on file  Stress: Not on file  Social Connections: Not on file  Intimate Partner Violence: Not At Risk (05/18/2022)   Humiliation, Afraid, Rape, and Kick questionnaire    Fear of Current or Ex-Partner: No    Emotionally Abused: No    Physically Abused: No    Sexually Abused: No     PHYSICAL EXAM  GENERAL EXAM/CONSTITUTIONAL: Vitals:  Vitals:   02/18/23 1449  BP: 117/67  Pulse: 77  Weight: 220 lb (99.8 kg)  Height: 5\' 8"  (1.727 m)   Body mass index is 33.45 kg/m. Wt Readings from Last 3 Encounters:  02/18/23 220 lb (99.8 kg)  05/18/22 213 lb 13.5 oz (97 kg)  05/07/22 215 lb (97.5 kg)   Patient is in no distress; well developed, nourished and groomed; neck is supple  CARDIOVASCULAR: Examination of carotid arteries is normal; no carotid bruits Regular rate and rhythm; SYSTOLIC MURMUR Examination of peripheral vascular system by observation and palpation is normal  EYES: Ophthalmoscopic exam of optic discs and posterior segments is normal; no papilledema or hemorrhages No results found.  MUSCULOSKELETAL: Gait, strength, tone, movements noted in Neurologic exam below  NEUROLOGIC: MENTAL STATUS:       No data to display         awake, alert, oriented to person, place and time recent and remote memory intact normal attention and concentration language fluent, comprehension intact, naming intact fund of knowledge appropriate  CRANIAL NERVE:  2nd - no papilledema on fundoscopic exam 2nd, 3rd, 4th, 6th - pupils equal and reactive to light, visual fields full to confrontation, extraocular muscles intact, no nystagmus 5th - facial sensation symmetric 7th - facial strength symmetric 8th - hearing intact 9th - palate elevates symmetrically, uvula midline 11th - shoulder shrug symmetric 12th - tongue protrusion midline  MOTOR:  normal bulk and tone, full strength in the BUE, BLE  SENSORY:  normal and symmetric to light touch, pinprick, temperature, vibration  COORDINATION:  finger-nose-finger, fine finger movements normal  REFLEXES:  deep tendon reflexes present and symmetric  GAIT/STATION:  narrow based gait     DIAGNOSTIC DATA (LABS, IMAGING, TESTING) - I reviewed patient records, labs, notes, testing and imaging myself where available.  Lab Results  Component Value Date   WBC 10.2 05/19/2022   HGB 12.0 (L) 05/19/2022   HCT 37.0 (L) 05/19/2022   MCV 93.9 05/19/2022   PLT 141 (L) 05/19/2022      Component Value Date/Time   NA 136 05/19/2022 0425   NA 143 02/24/2019 1359   K 4.6 05/19/2022 0425   CL 104 05/19/2022 0425   CO2 23 05/19/2022 0425   GLUCOSE 139 (H) 05/19/2022 0425   BUN 16 05/19/2022 0425   BUN 21 02/24/2019 1359   CREATININE 1.07 05/19/2022 0425   CREATININE 1.03 09/24/2020 0921   CALCIUM 8.8 (L) 05/19/2022 0425   PROT 7.8 09/15/2021 1530   ALBUMIN 4.3 09/15/2021 1530   AST 22 09/15/2021 1530   ALT 25 09/15/2021 1530   ALKPHOS 46 09/15/2021 1530   BILITOT 1.2 09/15/2021 1530   GFRNONAA >60 05/19/2022 0425   GFRAA >60 05/03/2019 0245   Lab Results  Component Value Date   CHOL 116 12/06/2018   HDL 45 12/06/2018   LDLCALC 66 12/06/2018    TRIG 25 12/06/2018   CHOLHDL 2.6 12/06/2018   Lab Results  Component Value Date   HGBA1C 5.2 04/28/2019   No results found for: "VITAMINB12" Lab Results  Component Value Date  TSH 0.47 09/24/2020    12/06/18 CTA head/neck No acute intracranial hemorrhage or evidence evolving recent  infarction.  No hemodynamically significant stenosis or evidence dissection.   1.5 cm right thyroid nodule.  12/06/18 MRI brain [I reviewed images myself and agree with interpretation. -VRP]  - No evidence of recent infarction, hemorrhage, or mass. - Mild chronic microvascular ischemic changes. Right caudate chronic infarct.  12/06/18 TTE  1. Left ventricular ejection fraction, by visual estimation, is 60 to 65%. The left ventricle has normal function. There is mildly increased left ventricular hypertrophy.  2. The left ventricle has no regional wall motion abnormalities.  3. Definity contrast agent was given IV to delineate the left ventricular endocardial borders.  4. Left ventricular diastolic parameters are consistent with Grade I diastolic dysfunction (impaired relaxation).  5. Global right ventricle has normal systolic function.The right ventricular size is normal. No increase in right ventricular wall thickness.  6. Left atrial size was normal.  7. Right atrial size was normal.  8. Mild to moderate mitral annular calcification.  9. The mitral valve is normal in structure. No evidence of mitral valve regurgitation. No evidence of mitral stenosis. 10. The tricuspid valve is normal in structure. Tricuspid valve regurgitation is trivial. 11. The aortic valve is tricuspid. Aortic valve regurgitation is not visualized. Severe aortic valve stenosis. Mean gradient 53 mmHg with AVA 0.57 cm^2. 12. The inferior vena cava is normal in size with greater than 50% respiratory variability, suggesting right atrial pressure of 3 mmHg. 13. The tricuspid regurgitant velocity is 2.47 m/s, and with an assumed  right atrial pressure of 3 mmHg, the estimated right ventricular systolic pressure is normal at 27.4 mmHg.   ASSESSMENT AND PLAN  83 y.o. year old male here with:  Dx:  1. TIA (transient ischemic attack)     PLAN:  TIA x 3 (transient right eye amaurosis fugax lasting 15-30 seconds each; ~2016, Nov 2020, Jan 2025)  - check MRI, MRA head, carotid u/s  - check 30 day cardiac monitoring (for consideration of anticoagulation for prior atrial fibrillation)  - stop aspirin 81mg  --> start plavix 75mg  daily  - stroke risk factor reduction and education reviewed  Orders Placed This Encounter  Procedures   MR BRAIN W WO CONTRAST   MR ANGIO HEAD WO CONTRAST   Cardiac event monitor   VAS US CAROTID   Meds ordered this encounter  Medications   clopidogrel (PLAVIX) 75 MG tablet    Sig: Take 1 tablet (75 mg total) by mouth daily.    Dispense:  30 tablet    Refill:  11  Return for pending if symptoms worsen or fail to improve, pending test results.  I reviewed images, labs, notes, records myself. I summarized findings and reviewed with patient, for this high risk condition (TIA) requiring high complexity decision making.    Suanne Marker, MD 02/18/2023, 3:37 PM Certified in Neurology, Neurophysiology and Neuroimaging  [facility] [address] [place], [place] [address] [phone]

## 2023-02-22 ENCOUNTER — Telehealth: Payer: Self-pay | Admitting: Diagnostic Neuroimaging

## 2023-02-22 NOTE — Telephone Encounter (Signed)
Patient calling checking on MRI that need to be scheduled as urgent. Pt said Dr. Marjory Lies was to mark as urgent. Would like a call back.

## 2023-02-23 ENCOUNTER — Telehealth: Payer: Self-pay | Admitting: Diagnostic Neuroimaging

## 2023-02-23 DIAGNOSIS — G459 Transient cerebral ischemic attack, unspecified: Secondary | ICD-10-CM

## 2023-02-23 NOTE — Telephone Encounter (Signed)
He is scheduled at GI for both on 2/11. MRI brain [facility] auth: [id number] exp. 02/23/23-03/24/23  MRA head [employer] M[employer] auth: [id number] exp. 02/23/23-03/24/23

## 2023-02-23 NOTE — Telephone Encounter (Signed)
Spoke to patient made him aware that [facility] had LVM for him to call back to make appointment Pt states didn't receive message . Gave Pt GI # to call and make appointment Pt thanked mr for calling

## 2023-02-23 NOTE — Telephone Encounter (Signed)
Pt's daughter, Azucena Fallen (on [place]) calling about father getting  heart monitor and Ultrasound the neck. Have heard from anyone about getting those items scheduled. Would like a call back.

## 2023-02-23 NOTE — Telephone Encounter (Signed)
Called LVM for Daughter to call office back to  discuss heart monitor and carotid ultrasound.

## 2023-02-24 ENCOUNTER — Encounter: Payer: Self-pay | Admitting: *Deleted

## 2023-02-24 NOTE — Progress Notes (Signed)
Patient enrolled for [employer]/ [employer] to ship a 30 day cardiac event monitor to his address on file.  Letter with instructions mailed to patient.  Dr. Simona Huh to read.

## 2023-02-24 NOTE — Addendum Note (Signed)
Addended by: Judi Cong on: 02/24/2023 07:47 AM   Modules accepted: Orders

## 2023-02-25 ENCOUNTER — Ambulatory Visit (HOSPITAL_COMMUNITY)
Admission: RE | Admit: 2023-02-25 | Discharge: 2023-02-25 | Disposition: A | Discharge status: Home / Self Care | Payer: Medicare Other | Source: Ambulatory Visit | Attending: Diagnostic Neuroimaging | Admitting: Diagnostic Neuroimaging

## 2023-02-25 DIAGNOSIS — G459 Transient cerebral ischemic attack, unspecified: Secondary | ICD-10-CM | POA: Diagnosis not present

## 2023-02-25 NOTE — Progress Notes (Signed)
VASCULAR LAB    Carotid duplex has been performed.  See CV proc for preliminary results.   Malaina Mortellaro, RVT 02/25/2023, 9:51 AM

## 2023-03-02 DIAGNOSIS — G459 Transient cerebral ischemic attack, unspecified: Secondary | ICD-10-CM

## 2023-03-03 DIAGNOSIS — G459 Transient cerebral ischemic attack, unspecified: Secondary | ICD-10-CM | POA: Diagnosis not present

## 2023-03-09 ENCOUNTER — Ambulatory Visit
Admission: RE | Admit: 2023-03-09 | Discharge: 2023-03-09 | Disposition: A | Discharge status: Home / Self Care | Payer: Medicare Other | Source: Ambulatory Visit | Attending: Diagnostic Neuroimaging | Admitting: Diagnostic Neuroimaging

## 2023-03-09 DIAGNOSIS — G459 Transient cerebral ischemic attack, unspecified: Secondary | ICD-10-CM

## 2023-03-09 MED ORDER — GADOPICLENOL 0.5 MMOL/ML IV SOLN
10.0000 mL | Freq: Once | INTRAVENOUS | Status: AC | PRN
  Administered 2023-03-09: 10 mL via INTRAVENOUS

## 2023-03-25 ENCOUNTER — Ambulatory Visit: Payer: Medicare Other | Attending: Diagnostic Neuroimaging

## 2023-03-25 DIAGNOSIS — G459 Transient cerebral ischemic attack, unspecified: Secondary | ICD-10-CM

## 2023-03-25 NOTE — Progress Notes (Signed)
 Results are good, no major findings. Continue current plan. -VRP

## 2023-04-05 DIAGNOSIS — Z8673 Personal history of transient ischemic attack (TIA), and cerebral infarction without residual deficits: Secondary | ICD-10-CM | POA: Diagnosis not present

## 2023-04-07 NOTE — Progress Notes (Signed)
 Results are good, no major findings. Continue current plan. -VRP

## 2023-04-08 ENCOUNTER — Ambulatory Visit
Admission: EM | Admit: 2023-04-08 | Discharge: 2023-04-08 | Disposition: A | Discharge status: Home / Self Care | Attending: Nurse Practitioner | Admitting: Nurse Practitioner

## 2023-04-08 DIAGNOSIS — J069 Acute upper respiratory infection, unspecified: Secondary | ICD-10-CM

## 2023-04-08 LAB — POC COVID19/FLU A&B COMBO
Covid Antigen, POC: NEGATIVE
Influenza A Antigen, POC: NEGATIVE
Influenza B Antigen, POC: NEGATIVE

## 2023-04-08 MED ORDER — FLUTICASONE PROPIONATE 50 MCG/ACT NA SUSP: 2.0000 | Freq: Every day | NASAL | 0 refills | Status: DC

## 2023-04-08 MED ORDER — AZITHROMYCIN 250 MG PO TABS: 250.0000 mg | ORAL_TABLET | Freq: Every day | ORAL | 0 refills | Status: DC

## 2023-04-08 NOTE — ED Provider Notes (Signed)
 [facility]    CSN: [id number] Arrival date & time: 04/08/23  1212      History   Chief Complaint No chief complaint on file.   HPI Andrew Watson is a 83 y.o. male.   The history is provided by the patient.   Patient presents for complaints of headache, body aches, nasal congestion, runny nose, and cough.  Symptoms have been present for the past 6 to 7 days.  Patient denies fever, ear pain, ear drainage, wheezing, difficulty breathing, chest pain, abdominal pain, nausea, vomiting, diarrhea, or rash.  Patient reports he has been taking over-the-counter antihistamines.  Reports he does have a history of seasonal allergies.  States that he is on the go a lot, and most likely has come into contact with someone who has been ill.  Past Medical History:  Diagnosis Date   Aortic valve stenosis    Arthritis    CAD (coronary artery disease) 05/07/2011   Mild atherosclerosis by cardiac catheterization February 2021   CHF (congestive heart failure) (HCC)    GERD (gastroesophageal reflux disease)    Heart murmur    History of atrial fibrillation 2013   Reportedly limited episode   History of kidney stones    History of MRSA infection    History of pancreatitis 2017   [facility]   History of pulmonary embolism 2017   History of TIA (transient ischemic attack)    November 2020   Hypothyroidism    S/P TAVR (transcatheter aortic valve replacement) 05/02/2019   s/p TAVR with a 26 mm Edwards Sapien 3 Ultra via the TF approach with Dr. Aundra Dubin & Dr. Laneta Simmers     Patient Active Problem List   Diagnosis Date Noted   OA (osteoarthritis) of knee 05/18/2022   Primary osteoarthritis of left knee 05/18/2022   GERD (gastroesophageal reflux disease)    Tiredness    Acute on chronic diastolic heart failure (HCC) 05/02/2019   S/P TAVR (transcatheter aortic valve replacement) 05/02/2019   History of TIA (transient ischemic attack)    Asymptomatic microscopic hematuria  04/07/2019   Calculus of kidney 04/07/2019   Aortic stenosis, severe    S/P  lap bilateral inguinal hernia repair Feb 2020 03/01/2018   Retroperitoneal fluid collection 04/20/2016   History of pulmonary embolism 08/12/2015   Duodenal stricture 08/10/2015   Gastric outlet obstruction 08/10/2015   H/O bypass gastrojejunostomy 08/10/2015   Pancreatic pseudocyst 08/01/2015   Insomnia 05/15/2011   Hypothyroidism 05/08/2011    Past Surgical History:  Procedure Laterality Date   BACK SURGERY     CARDIAC CATHETERIZATION     CATARACT EXTRACTION Bilateral    CHOLECYSTECTOMY     COLONOSCOPY  07/08/2011   Procedure: COLONOSCOPY;  Surgeon: Malissa Hippo, MD;  Location: [facility];  Service: Endoscopy;  Laterality: N/A;  930   CYSTOSCOPY WITH RETROGRADE PYELOGRAM, URETEROSCOPY AND STENT PLACEMENT Left 03/30/2019   Procedure: CYSTOSCOPY WITH LEFT  RETROGRADE , URETEROSCOPY HOLMIUM LASER AND STENT PLACEMENT;  Surgeon: Bjorn Pippin, MD;  Location: [facility];  Service: Urology;  Laterality: Left;   DUODENOTOMY  09/29/2016   repair of fistula, jejunal resection   INGUINAL HERNIA REPAIR N/A 03/01/2018   Procedure: LAPAROSCOPIC TEP BILATERAL INGUINAL HERNIA REPAIR;  Surgeon: Luretha Murphy, MD;  Location: [facility];  Service: General;  Laterality: N/A;   INTRAOPERATIVE TRANSTHORACIC ECHOCARDIOGRAM N/A 05/02/2019   Procedure: Intraoperative Transthoracic Echocardiogram;  Surgeon: Kathleene Hazel, MD;  Location: [facility] OR;  Service: Open Heart Surgery;  Laterality: N/A;   JEJUNOSTOMY FEEDING TUBE     KNEE ARTHROSCOPY Left    LEFT HEART CATHETERIZATION WITH CORONARY ANGIOGRAM N/A 05/07/2011   Procedure: LEFT HEART CATHETERIZATION WITH CORONARY ANGIOGRAM;  Surgeon: Peter M Swaziland, MD;  Location: [facility] CATH LAB;  Service: Cardiovascular;  Laterality: N/A;   PEG TUBE PLACEMENT     RIGHT/LEFT HEART CATH AND CORONARY ANGIOGRAPHY N/A 02/28/2019   Procedure: RIGHT/LEFT HEART CATH AND CORONARY ANGIOGRAPHY;   Surgeon: Kathleene Hazel, MD;  Location: [facility] INVASIVE CV LAB;  Service: Cardiovascular;  Laterality: N/A;   TOTAL KNEE ARTHROPLASTY Left 05/18/2022   Procedure: TOTAL KNEE ARTHROPLASTY;  Surgeon: Ollen Gross, MD;  Location: [facility]S;  Service: Orthopedics;  Laterality: Left;   TRANSCATHETER AORTIC VALVE REPLACEMENT, TRANSFEMORAL  05/02/2019   TRANSCATHETER AORTIC VALVE REPLACEMENT, TRANSFEMORAL N/A 05/02/2019   Procedure: TRANSCATHETER AORTIC VALVE REPLACEMENT, TRANSFEMORAL;  Surgeon: Kathleene Hazel, MD;  Location: [facility] OR;  Service: Open Heart Surgery;  Laterality: N/A;   VASECTOMY     WISDOM TOOTH EXTRACTION         Home Medications    Prior to Admission medications   Medication Sig Start Date End Date Taking? Authorizing Provider  azithromycin (ZITHROMAX) 250 MG tablet Take (2) tablets by mouth on day 1, then take (1) tablet by mouth on days 2-5. 12/09/22   Valentino Nose, NP  clopidogrel (PLAVIX) 75 MG tablet Take 1 tablet (75 mg total) by mouth daily. 02/18/23   Penumalli, Glenford Bayley, MD  gabapentin (NEURONTIN) 100 MG capsule Take 100 mg by mouth at bedtime. 01/29/23   [provider]  levothyroxine (SYNTHROID) 175 MCG tablet Take 175 mcg by mouth daily before breakfast.    [provider]  methocarbamol (ROBAXIN) 500 MG tablet Take 1 tablet (500 mg total) by mouth every 6 (six) hours as needed for muscle spasms. 05/19/22   Edmisten, Lyn Hollingshead, PA    Family History Family History  Problem Relation Age of Onset   Arthritis Mother    Other Brother        staph infection   Stroke Neg Hx     Social History Social History   Tobacco Use   Smoking status: Never   Smokeless tobacco: Never  Vaping Use   Vaping status: Never Used  Substance Use Topics   Alcohol use: No    Comment: none since Jun 26, 2015   Drug use: No     Allergies   Patient has no known allergies.   Review of Systems Review of Systems Per HPI  Physical Exam Triage  Vital Signs ED Triage Vitals  Encounter Vitals Group     BP 04/08/23 1223 104/72     Systolic BP Percentile --      Diastolic BP Percentile --      Pulse Rate 04/08/23 1223 92     Resp 04/08/23 1223 15     Temp 04/08/23 1223 97.7 F (36.5 C)     Temp Source 04/08/23 1223 Oral     SpO2 04/08/23 1223 95 %     Weight --      Height --      Head Circumference --      Peak Flow --      Pain Score 04/08/23 1224 0     Pain Loc --      Pain Education --      Exclude from Growth Chart --    No data found.  Updated Vital Signs BP  104/72 (BP Location: Right Arm)   Pulse 92   Temp 97.7 F (36.5 C) (Oral)   Resp 15   SpO2 95%   Visual Acuity Right Eye Distance:   Left Eye Distance:   Bilateral Distance:    Right Eye Near:   Left Eye Near:    Bilateral Near:     Physical Exam Vitals and nursing note reviewed.  Constitutional:      General: He is not in acute distress.    Appearance: Normal appearance.  HENT:     Head: Normocephalic.     Right Ear: Tympanic membrane, ear canal and external ear normal.     Left Ear: Tympanic membrane, ear canal and external ear normal.     Nose: Congestion present.     Right Turbinates: Enlarged and swollen.     Left Turbinates: Enlarged and swollen.     Right Sinus: No maxillary sinus tenderness or frontal sinus tenderness.     Left Sinus: No maxillary sinus tenderness or frontal sinus tenderness.     Mouth/Throat:     Lips: Pink.     Mouth: Mucous membranes are moist.     Pharynx: Oropharynx is clear. Uvula midline. Postnasal drip present. No pharyngeal swelling, oropharyngeal exudate, posterior oropharyngeal erythema or uvula swelling.     Comments: Cobblestoning present to posterior oropharynx  Eyes:     Conjunctiva/sclera: Conjunctivae normal.     Pupils: Pupils are equal, round, and reactive to light.  Cardiovascular:     Rate and Rhythm: Normal rate and regular rhythm.     Pulses: Normal pulses.     Heart sounds: Normal heart  sounds.  Pulmonary:     Effort: Pulmonary effort is normal. No respiratory distress.     Breath sounds: Normal breath sounds. No stridor. No wheezing, rhonchi or rales.  Abdominal:     General: Bowel sounds are normal.     Palpations: Abdomen is soft.     Tenderness: There is no abdominal tenderness.  Musculoskeletal:     Cervical back: Normal range of motion.  Lymphadenopathy:     Cervical: No cervical adenopathy.  Skin:    General: Skin is warm and dry.  Neurological:     General: No focal deficit present.     Mental Status: He is alert and oriented to person, place, and time.  Psychiatric:        Mood and Affect: Mood normal.        Behavior: Behavior normal.      UC Treatments / Results  Labs (all labs ordered are listed, but only abnormal results are displayed) Labs Reviewed  POC COVID19/FLU A&B COMBO    EKG   Radiology No results found.  Procedures Procedures (including critical care time)  Medications Ordered in UC Medications - No data to display  Initial Impression / Assessment and Plan / UC Course  I have reviewed the triage vital signs and the nursing notes.  Pertinent labs & imaging results that were available during my care of the patient were reviewed by me and considered in my medical decision making (see chart for details).  COVID/flu test is negative.  On exam, lung sounds are clear throughout, room air sats at 95%.  Patient has been experiencing upper respiratory symptoms for the past 6 to 7 days with no relief of symptoms.  Will treat empirically for bacterial upper respiratory infection with azithromycin 250 mg.  For his nasal congestion, fluticasone 50 mcg nasal spray was prescribed.  Supportive care recommendations were provided and discussed with the patient to include fluids, rest, over-the-counter analgesics, and use of a humidifier during sleep.  Discussed indications regarding follow-up.  Patient was in agreement with this plan of care and  verbalized understanding.  All questions were answered.  Patient stable for discharge.  Final Clinical Impressions(s) / UC Diagnoses   Final diagnoses:  None   Discharge Instructions   None    ED Prescriptions   None    PDMP not reviewed this encounter.   Abran Cantor, NP 04/08/23 1302

## 2023-04-08 NOTE — ED Triage Notes (Signed)
 Pt reports cough, fever, chills, fatigue, body ache.

## 2023-04-08 NOTE — Discharge Instructions (Signed)
 Take medication as directed. Continue your current allergy medication regimen. Increase fluids and get plenty of rest. May take Tylenol as needed for pain, fever, or general discomfort. Recommend normal saline nasal spray to help with nasal congestion throughout the day. For your cough, it may be helpful to use a humidifier at bedtime during sleep. If symptoms fail to improve with this treatment, you may follow-up in this clinic or with your primary care physician for further evaluation. Follow-up as needed.

## 2023-04-26 NOTE — Progress Notes (Unsigned)
 Cardiology Office Note    Date:  04/27/2023  ID:  Langston, [place] 1940/08/30, MRN [id number] Cardiologist: Nona Dell, MD    History of Present Illness:    Andrew Watson is a 83 y.o. male with past medical history of CAD (nonobstructive disease by cath in 02/2019), severe AS (s/p TAVR on 05/02/2019), HLD, history of PE, prior TIA and remote history of atrial fibrillation (no longer on anticoagulation) who presents to the office today for annual follow-up.   He was last examined by myself in 04/2022 and denied any recent anginal symptoms. Was still push mowing his yard and traveling frequently. He was needing to undergo knee replacement and was cleared to proceed with this. He had been on ASA and Plavix since undergoing TAVR and given more than a year out, Plavix was stopped.   In talking with the patient today, he reports overall doing well since his last visit. He continues to do yard work routinely at his local home along with several rental properties and also has a home at [place]. He denies any recent chest pain or dyspnea on exertion. No recent orthopnea, PND or pitting edema. Does report occasional, brief palpitations which only last for a few seconds and resolve. Recent monitor in 02/2023 showed predominantly normal sinus rhythm as outlined below with rare PAC's and PVC's but no episodes of atrial fibrillation or flutter.  Studies Reviewed:   EKG: EKG is ordered today and demonstrates:   EKG Interpretation Date/Time:  Tuesday April 27 2023 13:37:11 EDT Ventricular Rate:  76 PR Interval:  162 QRS Duration:  80 QT Interval:  400 QTC Calculation: 450 R Axis:   23  Text Interpretation: Normal sinus rhythm with sinus arrhythmia Normal ECG Confirmed by Randall An ([id number]) on 04/27/2023 1:41:07 PM       Echocardiogram: 05/2021 IMPRESSIONS     1. Left ventricular ejection fraction, by estimation, is 60 to 65%. The  left ventricle has normal function. The left  ventricle has no regional  wall motion abnormalities. There is mild left ventricular hypertrophy.  Left ventricular diastolic parameters  are consistent with Grade II diastolic dysfunction (pseudonormalization).  Elevated left ventricular end-diastolic pressure.   2. Right ventricular systolic function is low normal. The right  ventricular size is mildly enlarged. Tricuspid regurgitation signal is  inadequate for assessing PA pressure.   3. Left atrial size was moderately dilated.   4. The mitral valve is degenerative. Trivial mitral valve regurgitation.   5. The aortic valve has been repaired/replaced. Aortic valve  regurgitation is not visualized. There is a 26 mm Sapien prosthetic (TAVR)  valve present in the aortic position. Aortic valve mean gradient measures  12.0 mmHg. No paravalvular regurgitation.   6. Unable to estimate CVP.    Cardiac Telemetry: 02/2023 [facility] [employer] cardiac monitor reviewed.  Enrolled 22 days, diagnostic portion of monitor representing 35% (7 days, 9 hours, 57 minutes).   Predominant rhythm is sinus with heart rate ranging from 58 bpm up to 121 bpm and average heart rate 78 bpm. No atrial fibrillation was observed. There were rare PVCs representing 1% total beats and rare PACs representing less than 1% total beats. Limited ventricular bigeminy noted.  Description of brief NSVT looks to be more consistent with lead motion artifact. No pauses or high degree heart block.   Physical Exam:   VS:  BP 108/60 (Cuff Size: Normal)   Pulse 76   Ht 5\' 10"  (1.778 m)   Wt 220  lb (99.8 kg)   SpO2 96%   BMI 31.57 kg/m    Wt Readings from Last 3 Encounters:  04/27/23 220 lb (99.8 kg)  02/18/23 220 lb (99.8 kg)  05/18/22 213 lb 13.5 oz (97 kg)     GEN: Well nourished, well developed male appearing in no acute distress NECK: No JVD; No carotid bruits CARDIAC: RRR, no murmurs, rubs, gallops RESPIRATORY:  Clear to auscultation without rales, wheezing or  rhonchi  ABDOMEN: Appears non-distended. No obvious abdominal masses. EXTREMITIES: No clubbing or cyanosis. No pitting edema.  Distal pedal pulses are 2+ bilaterally.   Assessment and Plan:   1. Aortic Stenosis - He underwent TAVR in 04/2019 and most recent echocardiogram in 05/2021 showed his aortic valve was functioning normally with a mean gradient of 12 mmHg and no paravalvular regurgitation. Would anticipate repeat imaging within the next 2 to 3 years unless clinically indicated in the interim.  2. CAD - He had nonobstructive disease by prior cardiac catheterization in 02/2019. He remains active at baseline and denies any recent anginal symptoms. He was restarted on Plavix 75 mg daily by Neurology and would defer continuation of this to them. He has not been on a statin as his LDL was at 58 on most recent check.  3. Palpitations - He reports only rare, brief episodes which last for a few seconds and no persistent symptoms. Recent monitor in 02/2023 showed predominantly normal sinus rhythm as outlined above with rare PAC's and PVC's. I encouraged him to make [place] aware if symptoms increase in frequency or severity as a follow-up monitor could be obtained.  Signed, Ellsworth Lennox, PA-C

## 2023-04-27 ENCOUNTER — Encounter: Payer: Self-pay | Admitting: Student

## 2023-04-27 ENCOUNTER — Ambulatory Visit: Payer: Medicare Other | Attending: Student | Admitting: Student

## 2023-04-27 ENCOUNTER — Encounter: Payer: Self-pay | Admitting: *Deleted

## 2023-04-27 VITALS — BP 108/60 | HR 76 | Ht 70.0 in | Wt 220.0 lb

## 2023-04-27 DIAGNOSIS — I2510 Atherosclerotic heart disease of native coronary artery without angina pectoris: Secondary | ICD-10-CM

## 2023-04-27 DIAGNOSIS — Z952 Presence of prosthetic heart valve: Secondary | ICD-10-CM | POA: Diagnosis not present

## 2023-04-27 DIAGNOSIS — R002 Palpitations: Secondary | ICD-10-CM

## 2023-04-27 NOTE — Patient Instructions (Signed)
 Medication Instructions:  Your physician recommends that you continue on your current medications as directed. Please refer to the Current Medication list given to you today.  *If you need a refill on your cardiac medications before your next appointment, please call your pharmacy*  Lab Work: NONE   If you have labs (blood work) drawn today and your tests are completely normal, you will receive your results only by: MyChart Message (if you have MyChart) OR A paper copy in the mail If you have any lab test that is abnormal or we need to change your treatment, we will call you to review the results.  Testing/Procedures: NONE   Follow-Up: At [facility], you and your health needs are our priority.  As part of our continuing mission to provide you with exceptional heart care, our providers are all part of one team.  This team includes your primary Cardiologist (physician) and Advanced Practice Providers or APPs (Physician Assistants and Nurse Practitioners) who all work together to provide you with the care you need, when you need it.  Your next appointment:   1 year(s)  Provider:   Nona Dell, MD or Randall An, PA-C    We recommend signing up for the patient portal called "MyChart".  Sign up information is provided on this After Visit Summary.  MyChart is used to connect with patients for Virtual Visits (Telemedicine).  Patients are able to view lab/test results, encounter notes, upcoming appointments, etc.  Non-urgent messages can be sent to your provider as well.   To learn more about what you can do with MyChart, go to [email or web address].   Other Instructions Thank you for choosing [facility]!

## 2023-06-03 DIAGNOSIS — M1711 Unilateral primary osteoarthritis, right knee: Secondary | ICD-10-CM | POA: Diagnosis not present

## 2023-06-03 DIAGNOSIS — G8929 Other chronic pain: Secondary | ICD-10-CM | POA: Diagnosis not present

## 2023-07-09 DIAGNOSIS — L989 Disorder of the skin and subcutaneous tissue, unspecified: Secondary | ICD-10-CM | POA: Diagnosis not present

## 2023-08-02 DIAGNOSIS — H35341 Macular cyst, hole, or pseudohole, right eye: Secondary | ICD-10-CM | POA: Diagnosis not present

## 2023-08-02 DIAGNOSIS — H35371 Puckering of macula, right eye: Secondary | ICD-10-CM | POA: Diagnosis not present

## 2023-08-02 DIAGNOSIS — H43822 Vitreomacular adhesion, left eye: Secondary | ICD-10-CM | POA: Diagnosis not present

## 2023-08-02 DIAGNOSIS — G453 Amaurosis fugax: Secondary | ICD-10-CM | POA: Diagnosis not present

## 2023-08-05 DIAGNOSIS — M1711 Unilateral primary osteoarthritis, right knee: Secondary | ICD-10-CM | POA: Diagnosis not present

## 2023-08-17 DIAGNOSIS — I5032 Chronic diastolic (congestive) heart failure: Secondary | ICD-10-CM | POA: Diagnosis not present

## 2023-08-17 DIAGNOSIS — I2510 Atherosclerotic heart disease of native coronary artery without angina pectoris: Secondary | ICD-10-CM | POA: Diagnosis not present

## 2023-08-18 ENCOUNTER — Telehealth: Payer: Self-pay | Admitting: Cardiology

## 2023-08-18 NOTE — Telephone Encounter (Signed)
 Called patient to schedule TELEVISIT patient is out of town and did not have his calendar or anything to write appointment information down patient stated he will call when he is back in town

## 2023-08-18 NOTE — Telephone Encounter (Signed)
 Primary Cardiologist:Samuel Debera, MD   Preoperative team, please contact this patient and set up a phone call appointment for further preoperative risk assessment. Please obtain consent and complete medication review. Thank you for your help.   I confirm that guidance regarding antiplatelet and oral anticoagulation therapy has been completed and, if necessary, noted below.  Plavix  is managed by neurology and therefore instructions to hold or continue will be their responsibility.   I also confirmed the patient resides in the state of [place] . As per [facility] Medical Board telemedicine laws, the patient must reside in the state in which the provider is licensed.   Thom LITTIE Sluder, PA-C  08/18/2023, 1:27 PM

## 2023-08-18 NOTE — Telephone Encounter (Signed)
   Pre-operative Risk Assessment    Patient Name: Andrew Watson  DOB: Sep 16, 1940 MRN: [id number]      Request for Surgical Clearance    Procedure:  Right total knee arthroplasty  Date of Surgery:  Clearance 11/01/23                                 Surgeon:  Dr. Dempsey Alluisio Surgeon's Group or Practice Name:  [facility] Phone number:  [phone] Fax number:  [phone]   Type of Clearance Requested:   - Medical  - Pharmacy:  Hold if applicable      Type of Anesthesia:  Choice   Additional requests/questions:    SignedAlan KANDICE Corona   08/18/2023, 1:06 PM

## 2023-08-19 ENCOUNTER — Telehealth: Payer: Self-pay | Admitting: *Deleted

## 2023-08-19 NOTE — Telephone Encounter (Signed)
 Pt has been scheduled tele preop appt 10/11/23. Med rec and consent are done.

## 2023-08-19 NOTE — Telephone Encounter (Signed)
 Pt has been scheduled tele preop appt 10/11/23. Med rec and consent are done.      Patient Consent for Virtual Visit        Andrew Watson has provided verbal consent on 08/19/2023 for a virtual visit (video or telephone).   CONSENT FOR VIRTUAL VISIT FOR:  Andrew Watson  By participating in this virtual visit I agree to the following:  I hereby voluntarily request, consent and authorize [facility] and its employed or contracted physicians, physician assistants, nurse practitioners or other licensed health care professionals (the Practitioner), to provide me with telemedicine health care services (the "Services) as deemed necessary by the treating Practitioner. I acknowledge and consent to receive the Services by the Practitioner via telemedicine. I understand that the telemedicine visit will involve communicating with the Practitioner through live audiovisual communication technology and the disclosure of certain medical information by electronic transmission. I acknowledge that I have been given the opportunity to request an in-person assessment or other available alternative prior to the telemedicine visit and am voluntarily participating in the telemedicine visit.  I understand that I have the right to withhold or withdraw my consent to the use of telemedicine in the course of my care at any time, without affecting my right to future care or treatment, and that the Practitioner or I may terminate the telemedicine visit at any time. I understand that I have the right to inspect all information obtained and/or recorded in the course of the telemedicine visit and may receive copies of available information for a reasonable fee.  I understand that some of the potential risks of receiving the Services via telemedicine include:  Delay or interruption in medical evaluation due to technological equipment failure or disruption; Information transmitted may not be sufficient (e.g. poor  resolution of images) to allow for appropriate medical decision making by the Practitioner; and/or  In rare instances, security protocols could fail, causing a breach of personal health information.  Furthermore, I acknowledge that it is my responsibility to provide information about my medical history, conditions and care that is complete and accurate to the best of my ability. I acknowledge that Practitioner's advice, recommendations, and/or decision may be based on factors not within their control, such as incomplete or inaccurate data provided by me or distortions of diagnostic images or specimens that may result from electronic transmissions. I understand that the practice of medicine is not an exact science and that Practitioner makes no warranties or guarantees regarding treatment outcomes. I acknowledge that a copy of this consent can be made available to me via my patient portal [facility] MyChart), or I can request a printed copy by calling the office of [facility].    I understand that my insurance will be billed for this visit.   I have read or had this consent read to me. I understand the contents of this consent, which adequately explains the benefits and risks of the Services being provided via telemedicine.  I have been provided ample opportunity to ask questions regarding this consent and the Services and have had my questions answered to my satisfaction. I give my informed consent for the services to be provided through the use of telemedicine in my medical care

## 2023-08-25 ENCOUNTER — Telehealth: Payer: Self-pay | Admitting: Neurology

## 2023-08-25 NOTE — Telephone Encounter (Signed)
 Received a surgical clearance form to be completed for the patient. Last ov here was 02/18/2023 at which point Dr Margaret said stop asa and start plavix  75 mg daily.  According to med list it is listed that pt reported 7/24 he is not taking this medication.   Called the pt to clarify and there was no answer. LVM asking for the pt to call back.   If pt calls back, please ask if he is in fact taking the plavix  75 mg daily. If so, then I will complete the form indicating that he should hold plavix  5 days prior to surgery and make sure Dr Margaret is ok.  If he is not, then we need to know why he is not taking the medication.

## 2023-09-07 NOTE — Telephone Encounter (Signed)
 Spoke w/Pt regarding surgical clearance and if Pt is still on Plavix . Pt stated he is taking Plavix . Pt stated he is considering canceling surgery as he is not sure he is ready to have the knee surgery. Thanked Pt for clarifying medication.

## 2023-09-15 DIAGNOSIS — L821 Other seborrheic keratosis: Secondary | ICD-10-CM | POA: Diagnosis not present

## 2023-09-15 DIAGNOSIS — L814 Other melanin hyperpigmentation: Secondary | ICD-10-CM | POA: Diagnosis not present

## 2023-10-11 ENCOUNTER — Ambulatory Visit: Attending: Internal Medicine

## 2023-10-11 DIAGNOSIS — Z01810 Encounter for preprocedural cardiovascular examination: Secondary | ICD-10-CM

## 2023-10-11 NOTE — Progress Notes (Signed)
   Patient Name: Andrew Watson  DOB: 10/22/1940 MRN: [id number]  Primary Cardiologist: Jayson Sierras, MD  Chart reviewed as part of pre-operative protocol coverage.  Patient was contacted this morning regarding preoperative cardiovascular exam for total knee arthroplasty.  Patient reports that he has elected to cancel the procedure as he feels his knee has been doing well.  Patient denies any cardiac concerns or complaints at this time, will cancel preoperative cardiac evaluation via telehealth today and no charge visit.  Patient was instructed to notify the office if he has any changes or elects to proceed with surgery.  Narvel Kozub D Habib Kise, NP 10/11/2023, 9:10 AM

## 2023-10-18 DIAGNOSIS — E7800 Pure hypercholesterolemia, unspecified: Secondary | ICD-10-CM | POA: Diagnosis not present

## 2023-10-18 DIAGNOSIS — R202 Paresthesia of skin: Secondary | ICD-10-CM | POA: Diagnosis not present

## 2023-10-18 DIAGNOSIS — Z0000 Encounter for general adult medical examination without abnormal findings: Secondary | ICD-10-CM | POA: Diagnosis not present

## 2023-10-18 DIAGNOSIS — Z23 Encounter for immunization: Secondary | ICD-10-CM | POA: Diagnosis not present

## 2023-10-18 DIAGNOSIS — E538 Deficiency of other specified B group vitamins: Secondary | ICD-10-CM | POA: Diagnosis not present

## 2023-10-18 DIAGNOSIS — M542 Cervicalgia: Secondary | ICD-10-CM | POA: Diagnosis not present

## 2023-10-18 DIAGNOSIS — E039 Hypothyroidism, unspecified: Secondary | ICD-10-CM | POA: Diagnosis not present

## 2023-10-18 DIAGNOSIS — Z1331 Encounter for screening for depression: Secondary | ICD-10-CM | POA: Diagnosis not present

## 2023-10-20 ENCOUNTER — Encounter: Payer: Self-pay | Admitting: Urology

## 2023-10-20 ENCOUNTER — Ambulatory Visit (INDEPENDENT_AMBULATORY_CARE_PROVIDER_SITE_OTHER): Admitting: Urology

## 2023-10-20 VITALS — BP 117/76 | HR 69 | Ht 68.0 in | Wt 218.0 lb

## 2023-10-20 DIAGNOSIS — R351 Nocturia: Secondary | ICD-10-CM

## 2023-10-20 DIAGNOSIS — R3129 Other microscopic hematuria: Secondary | ICD-10-CM

## 2023-10-20 DIAGNOSIS — B3742 Candidal balanitis: Secondary | ICD-10-CM

## 2023-10-20 LAB — MICROSCOPIC EXAMINATION

## 2023-10-20 LAB — URINALYSIS, ROUTINE W REFLEX MICROSCOPIC
Bilirubin, UA: NEGATIVE
Glucose, UA: NEGATIVE
Ketones, UA: NEGATIVE
Leukocytes,UA: NEGATIVE
Nitrite, UA: NEGATIVE
Protein,UA: NEGATIVE
Specific Gravity, UA: 1.025 (ref 1.005–1.030)
Urobilinogen, Ur: 0.2 mg/dL (ref 0.2–1.0)
pH, UA: 5.5 (ref 5.0–7.5)

## 2023-10-20 LAB — BLADDER SCAN AMB NON-IMAGING

## 2023-10-20 MED ORDER — NYSTATIN 100000 UNIT/GM EX CREA: 1.0000 | TOPICAL_CREAM | Freq: Two times a day (BID) | CUTANEOUS | 1 refills | Status: DC

## 2023-10-20 NOTE — Progress Notes (Signed)
 Assessment: 1. Nocturia   2. Candidal balanitis   3. Microscopic hematuria     Plan: I personally reviewed the patient's chart including provider notes, lab results. I discussed possible causes of nocturia.  I also advised him that evaluation and treatment is based on how significant this affects a patient's quality of life.  He is currently not bothered by it. Recommend nystatin  cream to penis twice daily for treatment of Candida balanitis.  I also discussed keeping the area dry following treatment. Recommend repeat urinalysis in 1 month for microscopic hematuria.  Chief Complaint:  Chief Complaint  Patient presents with   Nocturia    History of Present Illness:  Andrew Watson is a 83 y.o. male who is seen in consultation from Seabron Lenis, MD for evaluation of nocturia and a penile lesion. He reports nocturia for 2-3 years.  He has nocturia 3-4 times.  No daytime frequency.  He reports voiding with a good stream and feels like he empties his bladder well.  No dysuria or gross hematuria. IPSS = 17/4. No history of sleep apnea.  He also reports a rash on the penis for several months.  He has noted some bleeding from the area.  He is not circumcised.  He has a history of nephrolithiasis.  He underwent ureteroscopic stone extraction with laser lithotripsy in March 2021.  No recent stone symptoms.   Past Medical History:  Past Medical History:  Diagnosis Date   Aortic valve stenosis    Arthritis    CAD (coronary artery disease) 05/07/2011   Mild atherosclerosis by cardiac catheterization February 2021   CHF (congestive heart failure) (HCC)    GERD (gastroesophageal reflux disease)    Heart murmur    History of atrial fibrillation 2013   Reportedly limited episode   History of kidney stones    History of MRSA infection    History of pancreatitis 2017   [facility]   History of pulmonary embolism 2017   History of TIA (transient ischemic attack)    November  2020   Hypothyroidism    S/P TAVR (transcatheter aortic valve replacement) 05/02/2019   s/p TAVR with a 26 mm Edwards Sapien 3 Ultra via the TF approach with Dr. Susy & Dr. Lucas     Past Surgical History:  Past Surgical History:  Procedure Laterality Date   BACK SURGERY     CARDIAC CATHETERIZATION     CATARACT EXTRACTION Bilateral    CHOLECYSTECTOMY     COLONOSCOPY  07/08/2011   Procedure: COLONOSCOPY;  Surgeon: Claudis RAYMOND Rivet, MD;  Location: [facility];  Service: Endoscopy;  Laterality: N/A;  930   CYSTOSCOPY WITH RETROGRADE PYELOGRAM, URETEROSCOPY AND STENT PLACEMENT Left 03/30/2019   Procedure: CYSTOSCOPY WITH LEFT  RETROGRADE , URETEROSCOPY HOLMIUM LASER AND STENT PLACEMENT;  Surgeon: Watt Rush, MD;  Location: [facility] ORS;  Service: Urology;  Laterality: Left;   DUODENOTOMY  09/29/2016   repair of fistula, jejunal resection   INGUINAL HERNIA REPAIR N/A 03/01/2018   Procedure: LAPAROSCOPIC TEP BILATERAL INGUINAL HERNIA REPAIR;  Surgeon: Gladis Cough, MD;  Location: [facility] ORS;  Service: General;  Laterality: N/A;   INTRAOPERATIVE TRANSTHORACIC ECHOCARDIOGRAM N/A 05/02/2019   Procedure: Intraoperative Transthoracic Echocardiogram;  Surgeon: Verlin Lonni BIRCH, MD;  Location: [facility] OR;  Service: Open Heart Surgery;  Laterality: N/A;   JEJUNOSTOMY FEEDING TUBE     KNEE ARTHROSCOPY Left    LEFT HEART CATHETERIZATION WITH CORONARY ANGIOGRAM N/A 05/07/2011   Procedure: LEFT HEART CATHETERIZATION  WITH CORONARY ANGIOGRAM;  Surgeon: Peter M Swaziland, MD;  Location: [facility] CATH LAB;  Service: Cardiovascular;  Laterality: N/A;   PEG TUBE PLACEMENT     RIGHT/LEFT HEART CATH AND CORONARY ANGIOGRAPHY N/A 02/28/2019   Procedure: RIGHT/LEFT HEART CATH AND CORONARY ANGIOGRAPHY;  Surgeon: Verlin Lonni BIRCH, MD;  Location: [facility] INVASIVE CV LAB;  Service: Cardiovascular;  Laterality: N/A;   TOTAL KNEE ARTHROPLASTY Left 05/18/2022   Procedure: TOTAL KNEE ARTHROPLASTY;  Surgeon: Melodi Lerner, MD;   Location: [facility] ORS;  Service: Orthopedics;  Laterality: Left;   TRANSCATHETER AORTIC VALVE REPLACEMENT, TRANSFEMORAL  05/02/2019   TRANSCATHETER AORTIC VALVE REPLACEMENT, TRANSFEMORAL N/A 05/02/2019   Procedure: TRANSCATHETER AORTIC VALVE REPLACEMENT, TRANSFEMORAL;  Surgeon: Verlin Lonni BIRCH, MD;  Location: [facility] OR;  Service: Open Heart Surgery;  Laterality: N/A;   VASECTOMY     WISDOM TOOTH EXTRACTION      Allergies:  No Known Allergies  Family History:  Family History  Problem Relation Age of Onset   Arthritis Mother    Other Brother        staph infection   Stroke Neg Hx     Social History:  Social History   Tobacco Use   Smoking status: Never   Smokeless tobacco: Never  Vaping Use   Vaping status: Never Used  Substance Use Topics   Alcohol use: No    Comment: none since Jun 26, 2015   Drug use: No    Review of symptoms:  Constitutional:  Negative for unexplained weight loss, night sweats, fever, chills ENT:  Negative for nose bleeds, sinus pain, painful swallowing CV:  Negative for chest pain, shortness of breath, exercise intolerance, palpitations, loss of consciousness Resp:  Negative for cough, wheezing, shortness of breath GI:  Negative for nausea, vomiting, diarrhea, bloody stools GU:  Positives noted in HPI; otherwise negative for gross hematuria, dysuria, urinary incontinence Neuro:  Negative for seizures, poor balance, limb weakness, slurred speech Psych:  Negative for lack of energy, depression, anxiety Endocrine:  Negative for polydipsia, polyuria, symptoms of hypoglycemia (dizziness, hunger, sweating) Hematologic:  Negative for anemia, purpura, petechia, prolonged or excessive bleeding, use of anticoagulants  Allergic:  Negative for difficulty breathing or choking as a result of exposure to anything; no shellfish allergy; no allergic response (rash/itch) to materials, foods  Physical exam: BP 117/76   Pulse 69   Ht 5' 8 (1.727 m)   Wt 218 lb (98.9  kg)   BMI 33.15 kg/m  GENERAL APPEARANCE:  Well appearing, well developed, well nourished, NAD HEENT: Atraumatic, Normocephalic, oropharynx clear. NECK: Supple without lymphadenopathy or thyromegaly. LUNGS: Clear to auscultation bilaterally. HEART: Regular Rate and Rhythm without murmurs, gallops, or rubs. ABDOMEN: Soft, non-tender, No Masses. EXTREMITIES: Moves all extremities well.  Without clubbing, cyanosis, or edema. NEUROLOGIC:  Alert and oriented x 3, normal gait, CN II-XII grossly intact.  MENTAL STATUS:  Appropriate. BACK:  Non-tender to palpation.  No CVAT SKIN:  Warm, dry and intact.   GU: Penis:  uncircumcised, erythema of glans and foreskin consistent with candida balanitis Meatus: Normal Scrotum: normal, no masses Testis: normal without masses bilateral Prostate: 50 g, NT, no nodules Rectum: Normal tone,  no masses or tenderness   Results: U/A: 0-5 WBCs, 3-10 RBCs  PVR = 0 ml

## 2023-11-01 ENCOUNTER — Ambulatory Visit (HOSPITAL_COMMUNITY): Admit: 2023-11-01 | Admitting: Orthopedic Surgery

## 2023-11-01 SURGERY — ARTHROPLASTY, KNEE, TOTAL
Anesthesia: Choice | Site: Knee | Laterality: Right

## 2023-11-12 DIAGNOSIS — I2510 Atherosclerotic heart disease of native coronary artery without angina pectoris: Secondary | ICD-10-CM | POA: Diagnosis not present

## 2023-11-12 DIAGNOSIS — Z952 Presence of prosthetic heart valve: Secondary | ICD-10-CM | POA: Diagnosis not present

## 2023-11-12 DIAGNOSIS — E039 Hypothyroidism, unspecified: Secondary | ICD-10-CM | POA: Diagnosis not present

## 2023-11-18 ENCOUNTER — Other Ambulatory Visit

## 2023-11-19 ENCOUNTER — Other Ambulatory Visit

## 2023-11-19 DIAGNOSIS — H9192 Unspecified hearing loss, left ear: Secondary | ICD-10-CM | POA: Diagnosis not present

## 2023-11-19 DIAGNOSIS — H6122 Impacted cerumen, left ear: Secondary | ICD-10-CM | POA: Diagnosis not present

## 2024-01-22 ENCOUNTER — Encounter (HOSPITAL_BASED_OUTPATIENT_CLINIC_OR_DEPARTMENT_OTHER): Payer: Self-pay

## 2024-01-22 ENCOUNTER — Observation Stay (HOSPITAL_BASED_OUTPATIENT_CLINIC_OR_DEPARTMENT_OTHER)
Admission: EM | Admit: 2024-01-22 | Discharge: 2024-01-27 | Disposition: A | Discharge status: Home / Self Care | Attending: Internal Medicine | Admitting: Internal Medicine

## 2024-01-22 ENCOUNTER — Other Ambulatory Visit: Payer: Self-pay

## 2024-01-22 ENCOUNTER — Emergency Department (HOSPITAL_BASED_OUTPATIENT_CLINIC_OR_DEPARTMENT_OTHER)

## 2024-01-22 DIAGNOSIS — D72819 Decreased white blood cell count, unspecified: Secondary | ICD-10-CM | POA: Insufficient documentation

## 2024-01-22 DIAGNOSIS — R531 Weakness: Secondary | ICD-10-CM | POA: Diagnosis not present

## 2024-01-22 DIAGNOSIS — Z8673 Personal history of transient ischemic attack (TIA), and cerebral infarction without residual deficits: Secondary | ICD-10-CM | POA: Insufficient documentation

## 2024-01-22 DIAGNOSIS — Z79899 Other long term (current) drug therapy: Secondary | ICD-10-CM | POA: Diagnosis not present

## 2024-01-22 DIAGNOSIS — E039 Hypothyroidism, unspecified: Secondary | ICD-10-CM | POA: Diagnosis not present

## 2024-01-22 DIAGNOSIS — I5032 Chronic diastolic (congestive) heart failure: Secondary | ICD-10-CM | POA: Insufficient documentation

## 2024-01-22 DIAGNOSIS — J101 Influenza due to other identified influenza virus with other respiratory manifestations: Principal | ICD-10-CM | POA: Insufficient documentation

## 2024-01-22 DIAGNOSIS — R0602 Shortness of breath: Secondary | ICD-10-CM | POA: Diagnosis present

## 2024-01-22 DIAGNOSIS — I2510 Atherosclerotic heart disease of native coronary artery without angina pectoris: Secondary | ICD-10-CM | POA: Insufficient documentation

## 2024-01-22 DIAGNOSIS — Z7982 Long term (current) use of aspirin: Secondary | ICD-10-CM | POA: Insufficient documentation

## 2024-01-22 DIAGNOSIS — Z96652 Presence of left artificial knee joint: Secondary | ICD-10-CM | POA: Insufficient documentation

## 2024-01-22 DIAGNOSIS — Z86711 Personal history of pulmonary embolism: Secondary | ICD-10-CM | POA: Insufficient documentation

## 2024-01-22 DIAGNOSIS — D696 Thrombocytopenia, unspecified: Secondary | ICD-10-CM | POA: Insufficient documentation

## 2024-01-22 DIAGNOSIS — J111 Influenza due to unidentified influenza virus with other respiratory manifestations: Principal | ICD-10-CM

## 2024-01-22 DIAGNOSIS — I480 Paroxysmal atrial fibrillation: Secondary | ICD-10-CM | POA: Diagnosis not present

## 2024-01-22 DIAGNOSIS — Z952 Presence of prosthetic heart valve: Secondary | ICD-10-CM | POA: Diagnosis not present

## 2024-01-22 DIAGNOSIS — E038 Other specified hypothyroidism: Secondary | ICD-10-CM | POA: Diagnosis not present

## 2024-01-22 DIAGNOSIS — Z7902 Long term (current) use of antithrombotics/antiplatelets: Secondary | ICD-10-CM | POA: Diagnosis not present

## 2024-01-22 DIAGNOSIS — N179 Acute kidney failure, unspecified: Secondary | ICD-10-CM | POA: Diagnosis not present

## 2024-01-22 LAB — CBC
HCT: 43.1 % (ref 39.0–52.0)
Hemoglobin: 14.2 g/dL (ref 13.0–17.0)
MCH: 30.3 pg (ref 26.0–34.0)
MCHC: 32.9 g/dL (ref 30.0–36.0)
MCV: 91.9 fL (ref 80.0–100.0)
Platelets: 105 K/uL — ABNORMAL LOW (ref 150–400)
RBC: 4.69 MIL/uL (ref 4.22–5.81)
RDW: 12.9 % (ref 11.5–15.5)
WBC: 3.3 K/uL — ABNORMAL LOW (ref 4.0–10.5)
nRBC: 0 % (ref 0.0–0.2)

## 2024-01-22 LAB — TROPONIN T, HIGH SENSITIVITY: Troponin T High Sensitivity: 16 ng/L (ref 0–19)

## 2024-01-22 LAB — BASIC METABOLIC PANEL WITH GFR
Anion gap: 10 (ref 5–15)
BUN: 22 mg/dL (ref 8–23)
CO2: 27 mmol/L (ref 22–32)
Calcium: 8.8 mg/dL — ABNORMAL LOW (ref 8.9–10.3)
Chloride: 98 mmol/L (ref 98–111)
Creatinine, Ser: 1.24 mg/dL (ref 0.61–1.24)
GFR, Estimated: 57 mL/min — ABNORMAL LOW
Glucose, Bld: 87 mg/dL (ref 70–99)
Potassium: 3.9 mmol/L (ref 3.5–5.1)
Sodium: 134 mmol/L — ABNORMAL LOW (ref 135–145)

## 2024-01-22 LAB — LACTIC ACID, PLASMA: Lactic Acid, Venous: 1.3 mmol/L (ref 0.5–1.9)

## 2024-01-22 LAB — RESP PANEL BY RT-PCR (RSV, FLU A&B, COVID)  RVPGX2
Influenza A by PCR: POSITIVE — AB
Influenza B by PCR: NEGATIVE
Resp Syncytial Virus by PCR: NEGATIVE
SARS Coronavirus 2 by RT PCR: NEGATIVE

## 2024-01-22 MED ORDER — SODIUM CHLORIDE 0.9 % IV BOLUS
500.0000 mL | Freq: Once | INTRAVENOUS | Status: AC
  Administered 2024-01-22: 500 mL via INTRAVENOUS

## 2024-01-22 MED ORDER — ALBUTEROL SULFATE (2.5 MG/3ML) 0.083% IN NEBU
5.0000 mg | INHALATION_SOLUTION | Freq: Once | RESPIRATORY_TRACT | Status: AC
  Administered 2024-01-22: 5 mg via RESPIRATORY_TRACT
  Filled 2024-01-22: qty 6

## 2024-01-22 MED ORDER — OSELTAMIVIR PHOSPHATE 75 MG PO CAPS
75.0000 mg | ORAL_CAPSULE | Freq: Two times a day (BID) | ORAL | Status: DC
  Filled 2024-01-22: qty 1

## 2024-01-22 NOTE — ED Notes (Signed)
 Pt to XR

## 2024-01-22 NOTE — ED Provider Notes (Signed)
 " [place] [facility] DEPARTMENT AT [facility] Provider Note   CSN: [id number] Arrival date & time: 01/22/24  2018     Patient presents with: Shortness of Breath   Andrew Watson is a 83 y.o. male.   Patient is a 83 year old male who presents with cough wheezing and shortness of breath.  Andrew Watson started having symptoms 4 days ago with chills body aches and runny nose with coughing.  Andrew Watson started running a fever the next day.  Andrew Watson went to his PCP yesterday and tested positive for influenza.  Andrew Watson was started on Tamiflu .  They also started him on amoxicillin .  The daughter at bedside said Andrew Watson had some blood in his urine and that is why they started him on antibiotics.  Andrew Watson was having some urinary frequency but denies any currently.  Today started getting worse with increased shortness of breath and wheezing.  No history of underlying lung disease.  Andrew Watson started having some diarrhea since the antibiotics were started.       Prior to Admission medications  Medication Sig Start Date End Date Taking? Authorizing Provider  aspirin  325 MG tablet Take 325 mg by mouth daily.    [provider]  azithromycin  (ZITHROMAX ) 250 MG tablet Take 1 tablet (250 mg total) by mouth daily. Take first 2 tablets together, then 1 every day until finished. Patient not taking: Reported on 10/20/2023 04/08/23   Leath-Warren, Etta PARAS, NP  clopidogrel  (PLAVIX ) 75 MG tablet Take 1 tablet (75 mg total) by mouth daily. Patient not taking: Reported on 10/20/2023 02/18/23   Penumalli, Vikram R, MD  fluticasone  (FLONASE ) 50 MCG/ACT nasal spray Place 2 sprays into both nostrils daily. Patient not taking: Reported on 10/20/2023 04/08/23   Leath-Warren, Etta PARAS, NP  gabapentin (NEURONTIN) 100 MG capsule Take 100 mg by mouth at bedtime. Patient not taking: Reported on 10/20/2023 01/29/23   [provider]  levothyroxine  (SYNTHROID ) 175 MCG tablet Take 175 mcg by mouth daily before breakfast.    [provider]  methocarbamol  (ROBAXIN ) 500 MG tablet Take 1 tablet (500 mg total) by mouth every 6 (six) hours as needed for muscle spasms. Patient not taking: Reported on 10/20/2023 05/19/22   Reena Roxie CROME, PA  nystatin  cream (MYCOSTATIN ) Apply 1 Application topically 2 (two) times daily. 10/20/23   Stoneking, Adine PARAS., MD    Allergies: Patient has no known allergies.    Review of Systems  Constitutional:  Positive for chills, fatigue and fever. Negative for diaphoresis.  HENT:  Positive for congestion and rhinorrhea. Negative for sneezing.   Eyes: Negative.   Respiratory:  Positive for cough, shortness of breath and wheezing. Negative for chest tightness.   Cardiovascular:  Negative for chest pain and leg swelling.  Gastrointestinal:  Positive for diarrhea. Negative for abdominal pain, nausea and vomiting.  Genitourinary:  Positive for frequency. Negative for difficulty urinating and flank pain.  Musculoskeletal:  Positive for myalgias.  Skin:  Negative for rash.  Neurological:  Negative for dizziness, speech difficulty, weakness, numbness and headaches.    Updated Vital Signs BP 120/71 (BP Location: Right Arm)   Pulse 85   Temp 97.9 F (36.6 C) (Oral)   Resp 20   Ht 5' 8 (1.727 m)   Wt 99.3 kg   SpO2 97%   BMI 33.30 kg/m   Physical Exam Constitutional:      Appearance: Andrew Watson is well-developed.  HENT:     Head: Normocephalic and atraumatic.  Eyes:  Pupils: Pupils are equal, round, and reactive to light.  Cardiovascular:     Rate and Rhythm: Normal rate and regular rhythm.     Heart sounds: Normal heart sounds.  Pulmonary:     Effort: Pulmonary effort is normal. Tachypnea present. No respiratory distress.     Breath sounds: Wheezing present. No rales.  Chest:     Chest wall: No tenderness.  Abdominal:     General: Bowel sounds are normal.     Palpations: Abdomen is soft.     Tenderness: There is no abdominal tenderness. There is no guarding or rebound.   Musculoskeletal:        General: Normal range of motion.     Cervical back: Normal range of motion and neck supple.     Comments: No significant lower extremity edema  Lymphadenopathy:     Cervical: No cervical adenopathy.  Skin:    General: Skin is warm and dry.     Findings: No rash.  Neurological:     Mental Status: Andrew Watson is alert and oriented to person, place, and time.     (all labs ordered are listed, but only abnormal results are displayed) Labs Reviewed  RESP PANEL BY RT-PCR (RSV, FLU A&B, COVID)  RVPGX2 - Abnormal; Notable for the following components:      Result Value   Influenza A by PCR POSITIVE (*)    All other components within normal limits  BASIC METABOLIC PANEL WITH GFR - Abnormal; Notable for the following components:   Sodium 134 (*)    Calcium  8.8 (*)    GFR, Estimated 57 (*)    All other components within normal limits  CBC - Abnormal; Notable for the following components:   WBC 3.3 (*)    Platelets 105 (*)    All other components within normal limits  LACTIC ACID, PLASMA  LACTIC ACID, PLASMA  URINALYSIS, W/ REFLEX TO CULTURE (INFECTION SUSPECTED)  TROPONIN T, HIGH SENSITIVITY    EKG: EKG Interpretation Date/Time:  Saturday January 22 2024 20:31:37 EST Ventricular Rate:  88 PR Interval:  182 QRS Duration:  86 QT Interval:  366 QTC Calculation: 443 R Axis:   86  Text Interpretation: Sinus rhythm Borderline right axis deviation since last tracing no significant change Confirmed by Lenor Hollering [phone]) on 01/22/2024 8:36:15 PM  Radiology: [facility] Chest 2 View Result Date: 01/22/2024 EXAM: 2 VIEW(S) XRAY OF THE CHEST 01/22/2024 08:50:00 PM COMPARISON: 12/09/2022 CLINICAL HISTORY: shortness of breath FINDINGS: LUNGS AND PLEURA: Elevated right hemidiaphragm, unchanged. No focal pulmonary opacity. No pleural effusion. No pneumothorax. HEART AND MEDIASTINUM: Prosthetic aortic valve noted. No acute abnormality of the cardiac and mediastinal silhouettes.  BONES AND SOFT TISSUES: No acute osseous abnormality. IMPRESSION: 1. No acute cardiopulmonary findings. 2. Chronic elevation of the right hemidiaphragm, unchanged. 3. Prosthetic aortic valve. Electronically signed by: Dorethia Molt MD 01/22/2024 09:35 PM EST RP Workstation: [id number]     Procedures   Medications Ordered in the ED  oseltamivir  (TAMIFLU ) capsule 75 mg (has no administration in time range)  albuterol  (PROVENTIL ) (2.5 MG/3ML) 0.083% nebulizer solution 5 mg (5 mg Nebulization Given 01/22/24 2123)  sodium chloride  0.9 % bolus 500 mL (0 mLs Intravenous Stopped 01/22/24 2300)    Clinical Course as of 01/22/24 2335  Sat Jan 22, 2024  2328 Stable Flu A. Very dyspneic. Tachypneic to the 40s on arrival  [CC]    Clinical Course User Index [CC] Jerral Meth, MD  Medical Decision Making Amount and/or Complexity of Data Reviewed Labs: ordered. Radiology: ordered.  Risk Prescription drug management. Decision regarding hospitalization.   This patient presents to the ED for concern of wheezing, shortness of breath, this involves an extensive number of treatment options, and is a complaint that carries with it a high risk of complications and morbidity.  I considered the following differential and admission for this acute, potentially life threatening condition.  The differential diagnosis includes influenza, pneumonia, reactive airway disease, pulmonary edema, PE  MDM:    Patient is a 54-year-old who presents with cough and wheezing with shortness of breath.  Andrew Watson recently was diagnosed with influenza.  Andrew Watson was started on Tamiflu  yesterday.  Chest x-ray was done which does not show any evidence of pneumonia.  Andrew Watson is wheezy and was given nebulizer treatment.  Andrew Watson is feeling a bit better but still short of breath and has tachypnea.  Will plan admission.  Awaiting call from hospitalist, Dr. Nelida treatment to take call from Dr. Cleatus  (Labs, imaging,  consults)  Labs: I Ordered, and personally interpreted labs.  The pertinent results include: Positive influenza, normal lactic acid, normal WBC count, normal creatinine  Imaging Studies ordered: I ordered imaging studies including chest x-ray I independently visualized and interpreted imaging. I agree with the radiologist interpretation  Additional history obtained from daughter at bedside.  External records from outside source obtained and reviewed including prior notes  Cardiac Monitoring: The patient was maintained on a cardiac monitor.  If on the cardiac monitor, I personally viewed and interpreted the cardiac monitored which showed an underlying rhythm of: Sinus rhythm  Reevaluation: After the interventions noted above, I reevaluated the patient and found that they have :improved  Social Determinants of Health:    Disposition: Admit to hospital  Co morbidities that complicate the patient evaluation  Past Medical History:  Diagnosis Date   Aortic valve stenosis    Arthritis    CAD (coronary artery disease) 05/07/2011   Mild atherosclerosis by cardiac catheterization February 2021   CHF (congestive heart failure) (HCC)    GERD (gastroesophageal reflux disease)    Heart murmur    History of atrial fibrillation 2013   Reportedly limited episode   History of kidney stones    History of MRSA infection    History of pancreatitis 2017   [facility]   History of pulmonary embolism 2017   History of TIA (transient ischemic attack)    November 2020   Hypothyroidism    S/P TAVR (transcatheter aortic valve replacement) 05/02/2019   s/p TAVR with a 26 mm Edwards Sapien 3 Ultra via the TF approach with Dr. Susy & Dr. Lucas      Medicines Meds ordered this encounter  Medications   albuterol  (PROVENTIL ) (2.5 MG/3ML) 0.083% nebulizer solution 5 mg   sodium chloride  0.9 % bolus 500 mL   oseltamivir  (TAMIFLU ) capsule 75 mg    I have reviewed the patients home  medicines and have made adjustments as needed  Problem List / ED Course: Problem List Items Addressed This Visit   None Visit Diagnoses       Influenza    -  Primary   Relevant Medications   oseltamivir  (TAMIFLU ) capsule 75 mg (Start on 01/23/2024 10:00 AM)     Shortness of breath                    Final diagnoses:  Influenza  Shortness of breath  ED Discharge Orders     None          Lenor Hollering, MD 01/22/24 2337  "

## 2024-01-22 NOTE — ED Triage Notes (Signed)
 Pt was diagnosed with flu yesterday and his daughter reports he has been wheezing. He denies chest pain but reports exertional shortness of breath. RR 36 at triage.

## 2024-01-23 ENCOUNTER — Encounter (HOSPITAL_COMMUNITY): Payer: Self-pay | Admitting: Family Medicine

## 2024-01-23 DIAGNOSIS — R062 Wheezing: Secondary | ICD-10-CM | POA: Diagnosis not present

## 2024-01-23 DIAGNOSIS — J101 Influenza due to other identified influenza virus with other respiratory manifestations: Secondary | ICD-10-CM | POA: Diagnosis present

## 2024-01-23 DIAGNOSIS — D696 Thrombocytopenia, unspecified: Secondary | ICD-10-CM | POA: Diagnosis present

## 2024-01-23 DIAGNOSIS — D72819 Decreased white blood cell count, unspecified: Secondary | ICD-10-CM | POA: Diagnosis present

## 2024-01-23 DIAGNOSIS — J111 Influenza due to unidentified influenza virus with other respiratory manifestations: Secondary | ICD-10-CM | POA: Diagnosis present

## 2024-01-23 DIAGNOSIS — R0602 Shortness of breath: Secondary | ICD-10-CM | POA: Diagnosis present

## 2024-01-23 DIAGNOSIS — Z743 Need for continuous supervision: Secondary | ICD-10-CM | POA: Diagnosis not present

## 2024-01-23 LAB — CBC WITH DIFFERENTIAL/PLATELET
Abs Immature Granulocytes: 0.01 K/uL (ref 0.00–0.07)
Basophils Absolute: 0 K/uL (ref 0.0–0.1)
Basophils Relative: 0 %
Eosinophils Absolute: 0 K/uL (ref 0.0–0.5)
Eosinophils Relative: 0 %
HCT: 40.2 % (ref 39.0–52.0)
Hemoglobin: 13.4 g/dL (ref 13.0–17.0)
Immature Granulocytes: 0 %
Lymphocytes Relative: 28 %
Lymphs Abs: 0.8 K/uL (ref 0.7–4.0)
MCH: 30.5 pg (ref 26.0–34.0)
MCHC: 33.3 g/dL (ref 30.0–36.0)
MCV: 91.6 fL (ref 80.0–100.0)
Monocytes Absolute: 0.5 K/uL (ref 0.1–1.0)
Monocytes Relative: 16 %
Neutro Abs: 1.6 K/uL — ABNORMAL LOW (ref 1.7–7.7)
Neutrophils Relative %: 56 %
Platelets: 80 K/uL — ABNORMAL LOW (ref 150–400)
RBC: 4.39 MIL/uL (ref 4.22–5.81)
RDW: 13 % (ref 11.5–15.5)
WBC: 2.8 K/uL — ABNORMAL LOW (ref 4.0–10.5)
nRBC: 0 % (ref 0.0–0.2)

## 2024-01-23 LAB — BASIC METABOLIC PANEL WITH GFR
Anion gap: 11 (ref 5–15)
BUN: 24 mg/dL — ABNORMAL HIGH (ref 8–23)
CO2: 20 mmol/L — ABNORMAL LOW (ref 22–32)
Calcium: 8.7 mg/dL — ABNORMAL LOW (ref 8.9–10.3)
Chloride: 103 mmol/L (ref 98–111)
Creatinine, Ser: 1.01 mg/dL (ref 0.61–1.24)
GFR, Estimated: >60 mL/min
Glucose, Bld: 93 mg/dL (ref 70–99)
Potassium: 4 mmol/L (ref 3.5–5.1)
Sodium: 133 mmol/L — ABNORMAL LOW (ref 135–145)

## 2024-01-23 LAB — TECHNOLOGIST SMEAR REVIEW: Plt Morphology: DECREASED

## 2024-01-23 MED ORDER — OSELTAMIVIR PHOSPHATE 30 MG PO CAPS
30.0000 mg | ORAL_CAPSULE | Freq: Two times a day (BID) | ORAL | Status: DC
  Administered 2024-01-23 – 2024-01-24 (×3): 30 mg via ORAL
  Filled 2024-01-23 (×3): qty 1

## 2024-01-23 MED ORDER — SENNOSIDES-DOCUSATE SODIUM 8.6-50 MG PO TABS: 1.0000 | ORAL_TABLET | Freq: Every evening | ORAL | Status: DC | PRN

## 2024-01-23 MED ORDER — LEVOTHYROXINE SODIUM 25 MCG PO TABS
175.0000 ug | ORAL_TABLET | Freq: Every day | ORAL | Status: DC
  Administered 2024-01-23 – 2024-01-27 (×4): 175 ug via ORAL
  Filled 2024-01-23: qty 2
  Filled 2024-01-23: qty 1

## 2024-01-23 MED ORDER — ONDANSETRON HCL 4 MG PO TABS: 4.0000 mg | ORAL_TABLET | Freq: Four times a day (QID) | ORAL | Status: DC | PRN

## 2024-01-23 MED ORDER — ACETAMINOPHEN 650 MG RE SUPP: 650.0000 mg | Freq: Four times a day (QID) | RECTAL | Status: DC | PRN

## 2024-01-23 MED ORDER — ONDANSETRON HCL 4 MG/2ML IJ SOLN
4.0000 mg | Freq: Four times a day (QID) | INTRAMUSCULAR | Status: DC | PRN
  Administered 2024-01-25: 4 mg via INTRAVENOUS

## 2024-01-23 MED ORDER — SODIUM CHLORIDE 0.9 % IV SOLN: INTRAVENOUS | Status: AC

## 2024-01-23 MED ORDER — ASPIRIN 325 MG PO TABS
325.0000 mg | ORAL_TABLET | Freq: Every day | ORAL | Status: DC
  Administered 2024-01-23 – 2024-01-27 (×4): 325 mg via ORAL
  Filled 2024-01-23 (×2): qty 1

## 2024-01-23 MED ORDER — ACETAMINOPHEN 325 MG PO TABS
650.0000 mg | ORAL_TABLET | Freq: Four times a day (QID) | ORAL | Status: DC | PRN
  Administered 2024-01-24 – 2024-01-26 (×3): 650 mg via ORAL

## 2024-01-23 MED ORDER — ALBUTEROL SULFATE (2.5 MG/3ML) 0.083% IN NEBU
3.0000 mL | INHALATION_SOLUTION | Freq: Four times a day (QID) | RESPIRATORY_TRACT | Status: DC | PRN
  Administered 2024-01-23 – 2024-01-26 (×5): 3 mL via RESPIRATORY_TRACT
  Filled 2024-01-23: qty 3

## 2024-01-23 MED ORDER — PNEUMOCOCCAL 20-VAL CONJ VACC 0.5 ML IM SUSY: 0.5000 mL | PREFILLED_SYRINGE | INTRAMUSCULAR | Status: DC

## 2024-01-23 MED ORDER — CLOPIDOGREL BISULFATE 75 MG PO TABS
75.0000 mg | ORAL_TABLET | Freq: Every day | ORAL | Status: DC
  Administered 2024-01-23 – 2024-01-27 (×5): 75 mg via ORAL
  Filled 2024-01-23 (×2): qty 1

## 2024-01-23 MED ORDER — FLUTICASONE PROPIONATE 50 MCG/ACT NA SUSP
2.0000 | Freq: Every day | NASAL | Status: DC
  Administered 2024-01-23 – 2024-01-27 (×4): 2 via NASAL
  Filled 2024-01-23: qty 16

## 2024-01-23 MED ORDER — ENOXAPARIN SODIUM 40 MG/0.4ML IJ SOSY
40.0000 mg | PREFILLED_SYRINGE | INTRAMUSCULAR | Status: DC
  Administered 2024-01-23 – 2024-01-27 (×5): 40 mg via SUBCUTANEOUS
  Filled 2024-01-23 (×2): qty 0.4

## 2024-01-23 MED ORDER — ALBUTEROL SULFATE (2.5 MG/3ML) 0.083% IN NEBU: 2.5000 mg | INHALATION_SOLUTION | Freq: Four times a day (QID) | RESPIRATORY_TRACT | Status: DC | PRN

## 2024-01-23 MED ORDER — GUAIFENESIN 100 MG/5ML PO LIQD
5.0000 mL | ORAL | Status: DC | PRN
  Administered 2024-01-23 – 2024-01-26 (×9): 5 mL via ORAL
  Filled 2024-01-23 (×4): qty 10

## 2024-01-23 MED ORDER — NYSTATIN 100000 UNIT/GM EX CREA
1.0000 | TOPICAL_CREAM | Freq: Two times a day (BID) | CUTANEOUS | Status: DC
  Administered 2024-01-26: 1 via TOPICAL
  Filled 2024-01-23: qty 30

## 2024-01-23 MED ORDER — MELATONIN 3 MG PO TABS
3.0000 mg | ORAL_TABLET | Freq: Every day | ORAL | Status: DC
  Administered 2024-01-23 – 2024-01-26 (×5): 3 mg via ORAL
  Filled 2024-01-23 (×3): qty 1

## 2024-01-23 NOTE — TOC Initial Note (Signed)
 Transition of Care [facility]) - Initial/Assessment Note    Patient Details  Name: Andrew Watson MRN: [id number] Date of Birth: 1940/09/29  Transition of Care [facility]) CM/SW Contact:    Sonda Manuella Quill, RN Phone Number: 01/23/2024, 4:38 PM  Clinical Narrative:                 [name]/ pt in room; pt said he lives at home; he plans to return w/ family support at d/c; pt identified POC son Lesslie Mckeehan [phone]); family will provide transportation; insurance/PCP verified; he denied SDOH risks; pt does not have DME, HH services, or home oxygen; awaiting PT/OT evals.  Expected Discharge Plan: Home/Self Care Barriers to Discharge: Continued Medical Work up   Patient Goals and CMS Choice Patient states their goals for this hospitalization and ongoing recovery are:: home     Marin ownership interest in [facility].provided to:: Patient    Expected Discharge Plan and Services   Discharge Planning Services: CM Consult   Living arrangements for the past 2 months: Single Family Home                 DME Arranged: N/A DME Agency: NA       HH Arranged: NA HH Agency: NA        Prior Living Arrangements/Services Living arrangements for the past 2 months: Single Family Home Lives with:: Self Patient language and need for interpreter reviewed:: Yes Do you feel safe going back to the place where you live?: Yes      Need for Family Participation in Patient Care: Yes (Comment) Care giver support system in place?: Yes (comment) Current home services:  (n/a) Criminal Activity/Legal Involvement Pertinent to Current Situation/Hospitalization: No - Comment as needed  Activities of Daily Living   ADL Screening (condition at time of admission) Independently performs ADLs?: Yes (appropriate for developmental age) Is the patient deaf or have difficulty hearing?: Yes Does the patient have difficulty seeing, even when wearing glasses/contacts?: No Does the patient  have difficulty concentrating, remembering, or making decisions?: No  Permission Sought/Granted Permission sought to share information with : Case Manager Permission granted to share information with : Yes, Verbal Permission Granted  Share Information with NAME: Case Manager     Permission granted to share info w Relationship: Exavior Kimmons (son) [phone]     Emotional Assessment Appearance:: Appears stated age Attitude/Demeanor/Rapport: Gracious Affect (typically observed): Accepting Orientation: : Oriented to Self, Oriented to Place, Oriented to  Time, Oriented to Situation Alcohol / Substance Use: Not Applicable Psych Involvement: No (comment)  Admission diagnosis:  Shortness of breath [R06.02] Influenza A [J10.1] Influenza [J11.1] Patient Active Problem List   Diagnosis Date Noted   Thrombocytopenia 01/23/2024   Leukopenia 01/23/2024   Influenza A 01/22/2024   Nocturia 10/20/2023   Candidal balanitis 10/20/2023   OA (osteoarthritis) of knee 05/18/2022   Primary osteoarthritis of left knee 05/18/2022   GERD (gastroesophageal reflux disease)    Tiredness    Chronic diastolic CHF (congestive heart failure) (HCC) 05/02/2019   S/P TAVR (transcatheter aortic valve replacement) 05/02/2019   History of TIA (transient ischemic attack)    Microscopic hematuria 04/07/2019   Calculus of kidney 04/07/2019   Aortic stenosis, severe    S/P  lap bilateral inguinal hernia repair Feb 2020 03/01/2018   Retroperitoneal fluid collection 04/20/2016   History of pulmonary embolism 08/12/2015   Duodenal stricture 08/10/2015   Gastric outlet obstruction 08/10/2015   H/O bypass gastrojejunostomy 08/10/2015  Pancreatic pseudocyst 08/01/2015   Insomnia 05/15/2011   Hypothyroidism 05/08/2011   PCP:  Seabron Lenis, MD Pharmacy:   [facility] [place], [place] - [id number] [place] [address] Phone: [phone] Fax: [phone]  [facility]  [facility] [phone] - [place], [place] - [address] AT [facility] OF [address] [place] [address] Phone: [phone] Fax: [phone]     Social Drivers of Health (SDOH) Social History: SDOH Screenings   Food Insecurity: No Food Insecurity (01/23/2024)  Housing: Low Risk (01/23/2024)  Transportation Needs: No Transportation Needs (01/23/2024)  Utilities: Not At Risk (01/23/2024)  Depression (PHQ2-9): Low Risk (11/27/2021)  Social Connections: Moderately Isolated (01/23/2024)  Tobacco Use: Low Risk (01/23/2024)   SDOH Interventions: Food Insecurity Interventions: Intervention Not Indicated, Inpatient TOC Housing Interventions: Intervention Not Indicated, Inpatient TOC Transportation Interventions: Intervention Not Indicated, Inpatient TOC Utilities Interventions: Intervention Not Indicated, Inpatient TOC   Readmission Risk Interventions     No data to display

## 2024-01-23 NOTE — Plan of Care (Signed)
" °  Problem: Education: Goal: Knowledge of General Education information will improve Description: Including pain rating scale, medication(s)/side effects and non-pharmacologic comfort measures Outcome: Progressing   Problem: Health Behavior/Discharge Planning: Goal: Ability to manage health-related needs will improve Outcome: Progressing   Problem: Clinical Measurements: Goal: Ability to maintain clinical measurements within normal limits will improve Outcome: Progressing Goal: Will remain free from infection Outcome: Progressing Goal: Diagnostic test results will improve Outcome: Progressing Goal: Respiratory complications will improve Outcome: Progressing Goal: Cardiovascular complication will be avoided Outcome: Progressing   Problem: Activity: Goal: Risk for activity intolerance will decrease Outcome: Progressing   Problem: Nutrition: Goal: Adequate nutrition will be maintained Outcome: Progressing   Problem: Coping: Goal: Level of anxiety will decrease Outcome: Progressing   Problem: Elimination: Goal: Will not experience complications related to bowel motility 01/23/2024 0618 by Trudy Lorelie BROCKS, RN Outcome: Progressing 01/23/2024 0616 by Trudy Lorelie BROCKS, RN Outcome: Progressing Goal: Will not experience complications related to urinary retention Outcome: Progressing   Problem: Pain Managment: Goal: General experience of comfort will improve and/or be controlled Outcome: Progressing   Problem: Safety: Goal: Ability to remain free from injury will improve Outcome: Progressing   Problem: Skin Integrity: Goal: Risk for impaired skin integrity will decrease Outcome: Progressing   "

## 2024-01-23 NOTE — ED Notes (Signed)
 Daughter called to inform of transfer to [facility], and that pts coat was left in the ED at [facility]. Spoke with Rosaline via phone, gave pts room number.

## 2024-01-23 NOTE — ED Notes (Signed)
 [facility] called for transport.

## 2024-01-23 NOTE — Progress Notes (Signed)
 [facility]  Kelii K Miyoshi FMW:[id number]  DOB: 1940-03-11  DOA: 01/22/2024  PCP: Seabron Lenis, MD  01/23/2024,8:48 AM  LOS: 0 days    Code Status: Full code     from: Home   83 year old white male Severe AoS-transcatheter AV valve replacement with Dr. Sherrine in 2021 TIA 11/2018 Remote A-fib not on anticoagulation Renal stone with hydronephrosis requiring ureteroscopic stone extraction Complicated prior history--?  Migrated duodenal stent?  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 Influenzae with sepsis on admission secondary to hypotension on arrival Chest x-ray negative--Main symptoms are upper respiratory and diarrhea-continue droplet precautions Mildly hypotensive on admission so continue fluids at 75 cc/h for 22 hours and then stop  Relative leukopenia thrombocytopenia 2/2 viral illness?- Expect will resolve-watch closely--platelet smear shows smudge cells burr cells  Mild AKI on admission As above discussion careful volume resuscitation strategy given chronic HFpEF  Chronic HFpEF Previous AV valve repair 2021 Not on PTA diuretics-needs outpatient reevaluation at cardiology office  ?  A-fib in chart-last office note 04/27/2023 indicates SVT mainly with PACs PVCs apparently not on anticoagulation Previous TIA Only on Plavix  75 Seems to be in sinus rhythm at this time on exam No further workup  Complicated GI history-duodenal stent 2018?  Pancreatic pseudocyst several months later?  As above  Morbid obesity BMI of 33 class  II/class I  Gout   Data Reviewed today: Sodium 133 potassium 4.0 CO2 22 BUN/creatinine 20/1.01 WBC 2.8 platelet 80   DVT prophylaxis: Lovenox    Dispo/Global plan: Unclear but likely home in the next several days     Subjective:   Coherent awake alert little bit upset at that diapers are wet No chest pain Power 5/5   Objective + exam Vitals:   01/23/24 0030 01/23/24 0100 01/23/24 0200 01/23/24 0355  BP: 125/75 122/73 109/74 103/81  Pulse: 88 89 84 87  Resp:    16  Temp:    97.8 F (36.6 C)  TempSrc:    Oral  SpO2: 97% 96% 95% 96%  Weight:    99.3 kg  Height:    5' 8 (1.727 m)   Filed Weights   01/22/24 2028 01/23/24 0355  Weight: 99.3 kg 99.3 kg     Examination: EOMI NCAT no focal deficit no icterus no pallor neck soft supple rhinorrhea Power 5/5 ROM intact Trace rhonchi wheezes noted Abdomen soft no rebound no guarding No lower extremity edema     Scheduled Meds:  clopidogrel   75 mg Oral Daily   enoxaparin  (LOVENOX ) injection  40 mg Subcutaneous Q24H   levothyroxine   175 mcg Oral Q0600   melatonin  3 mg Oral QHS   oseltamivir   30 mg Oral BID   Continuous Infusions: acetaminophen  **OR** acetaminophen , albuterol , guaiFENesin , ondansetron  **OR** ondansetron  (ZOFRAN ) IV, senna-docusate  Time 40  Colen Grimes, MD  [facility]

## 2024-01-23 NOTE — H&P (Signed)
 " History and Physical    Andrew Watson FMW:[id number] DOB: [birth year] DOA: 01/22/2024  PCP: Seabron Lenis, MD   Patient coming from: Home   Chief Complaint: Cough, wheezing, SOB   HPI: Andrew Watson is an 83 y.o. male with medical history significant for severe AS status post TAVR, remote PE and atrial fibrillation no longer anticoagulated, TIA, and hypothyroidism who presents with cough and worsening shortness of breath and wheezing.  Patient developed generalized aches, cough, and rhinorrhea on 01/19/2024.  He developed a fever the following day and was then seen in outpatient clinic on 01/21/2024 where he tested positive for influenza A and was started on Tamiflu .  Despite this, his shortness of breath and wheezing have worsened.  Cough has been nonproductive and he denies any chest pain, abdominal pain, or leg swelling.  He denies dysuria or flank pain.   [facility] ED Course: Upon arrival to the ED, patient is found to be afebrile and saturating mid 90s on room air with elevated RR, normal HR, and stable BP.  Labs are most notable for WBC 3300, platelets 105,000, and positive influenza A PCR.  There are no acute findings on chest x-ray.  Patient was treated with IV fluids, Tamiflu , melatonin, and albuterol .  He was transferred to [facility] for ongoing care.  Review of Systems:  All other systems reviewed and apart from HPI, are negative.  Past Medical History:  Diagnosis Date   Aortic valve stenosis    Arthritis    CAD (coronary artery disease) 05/07/2011   Mild atherosclerosis by cardiac catheterization February 2021   CHF (congestive heart failure) (HCC)    GERD (gastroesophageal reflux disease)    Heart murmur    History of atrial fibrillation 2013   Reportedly limited episode   History of kidney stones    History of MRSA infection    History of pancreatitis 2017   [facility]   History of pulmonary embolism 2017   History of TIA (transient ischemic  attack)    November 2020   Hypothyroidism    S/P TAVR (transcatheter aortic valve replacement) 05/02/2019   s/p TAVR with a 26 mm Edwards Sapien 3 Ultra via the TF approach with Dr. Susy & Dr. Lucas     Past Surgical History:  Procedure Laterality Date   BACK SURGERY     CARDIAC CATHETERIZATION     CATARACT EXTRACTION Bilateral    CHOLECYSTECTOMY     COLONOSCOPY  07/08/2011   Procedure: COLONOSCOPY;  Surgeon: Claudis RAYMOND Rivet, MD;  Location: [facility] ENDO SUITE;  Service: Endoscopy;  Laterality: N/A;  930   CYSTOSCOPY WITH RETROGRADE PYELOGRAM, URETEROSCOPY AND STENT PLACEMENT Left 03/30/2019   Procedure: CYSTOSCOPY WITH LEFT  RETROGRADE , URETEROSCOPY HOLMIUM LASER AND STENT PLACEMENT;  Surgeon: Watt Rush, MD;  Location: [facility] ORS;  Service: Urology;  Laterality: Left;   DUODENOTOMY  09/29/2016   repair of fistula, jejunal resection   INGUINAL HERNIA REPAIR N/A 03/01/2018   Procedure: LAPAROSCOPIC TEP BILATERAL INGUINAL HERNIA REPAIR;  Surgeon: Gladis Cough, MD;  Location: [facility] ORS;  Service: General;  Laterality: N/A;   INTRAOPERATIVE TRANSTHORACIC ECHOCARDIOGRAM N/A 05/02/2019   Procedure: Intraoperative Transthoracic Echocardiogram;  Surgeon: Verlin Lonni BIRCH, MD;  Location: [facility] OR;  Service: Open Heart Surgery;  Laterality: N/A;   JEJUNOSTOMY FEEDING TUBE     KNEE ARTHROSCOPY Left    LEFT HEART CATHETERIZATION WITH CORONARY ANGIOGRAM N/A 05/07/2011   Procedure: LEFT HEART CATHETERIZATION WITH CORONARY ANGIOGRAM;  Surgeon: Maude  M Jordan, MD;  Location: [facility] CATH LAB;  Service: Cardiovascular;  Laterality: N/A;   PEG TUBE PLACEMENT     RIGHT/LEFT HEART CATH AND CORONARY ANGIOGRAPHY N/A 02/28/2019   Procedure: RIGHT/LEFT HEART CATH AND CORONARY ANGIOGRAPHY;  Surgeon: Verlin Lonni BIRCH, MD;  Location: [facility] INVASIVE CV LAB;  Service: Cardiovascular;  Laterality: N/A;   TOTAL KNEE ARTHROPLASTY Left 05/18/2022   Procedure: TOTAL KNEE ARTHROPLASTY;  Surgeon: Melodi Lerner, MD;   Location: [facility] ORS;  Service: Orthopedics;  Laterality: Left;   TRANSCATHETER AORTIC VALVE REPLACEMENT, TRANSFEMORAL  05/02/2019   TRANSCATHETER AORTIC VALVE REPLACEMENT, TRANSFEMORAL N/A 05/02/2019   Procedure: TRANSCATHETER AORTIC VALVE REPLACEMENT, TRANSFEMORAL;  Surgeon: Verlin Lonni BIRCH, MD;  Location: [facility] OR;  Service: Open Heart Surgery;  Laterality: N/A;   VASECTOMY     WISDOM TOOTH EXTRACTION      Social History:   reports that he has never smoked. He has never used smokeless tobacco. He reports that he does not drink alcohol and does not use drugs.  Allergies[1]  Family History  Problem Relation Age of Onset   Arthritis Mother    Other Brother        staph infection   Stroke Neg Hx      Prior to Admission medications  Medication Sig Start Date End Date Taking? Authorizing Provider  aspirin  325 MG tablet Take 325 mg by mouth daily.    [provider]  azithromycin  (ZITHROMAX ) 250 MG tablet Take 1 tablet (250 mg total) by mouth daily. Take first 2 tablets together, then 1 every day until finished. Patient not taking: Reported on 10/20/2023 04/08/23   Leath-Warren, Etta PARAS, NP  clopidogrel  (PLAVIX ) 75 MG tablet Take 1 tablet (75 mg total) by mouth daily. Patient not taking: Reported on 10/20/2023 02/18/23   Penumalli, Vikram R, MD  fluticasone  (FLONASE ) 50 MCG/ACT nasal spray Place 2 sprays into both nostrils daily. Patient not taking: Reported on 10/20/2023 04/08/23   Leath-Warren, Etta PARAS, NP  gabapentin (NEURONTIN) 100 MG capsule Take 100 mg by mouth at bedtime. Patient not taking: Reported on 10/20/2023 01/29/23   [provider]  levothyroxine  (SYNTHROID ) 175 MCG tablet Take 175 mcg by mouth daily before breakfast.    [provider]  methocarbamol  (ROBAXIN ) 500 MG tablet Take 1 tablet (500 mg total) by mouth every 6 (six) hours as needed for muscle spasms. Patient not taking: Reported on 10/20/2023 05/19/22   Reena Roxie CROME, PA  nystatin   cream (MYCOSTATIN ) Apply 1 Application topically 2 (two) times daily. 10/20/23   Roseann Adine PARAS., MD    Physical Exam: Vitals:   01/23/24 0030 01/23/24 0100 01/23/24 0200 01/23/24 0355  BP: 125/75 122/73 109/74 103/81  Pulse: 88 89 84 87  Resp:    16  Temp:    97.8 F (36.6 C)  TempSrc:    Oral  SpO2: 97% 96% 95% 96%  Weight:    99.3 kg  Height:    5' 8 (1.727 m)    Constitutional: NAD, no pallor or diaphoresis  Eyes: PERTLA, lids and conjunctivae normal ENMT: Mucous membranes are moist. Posterior pharynx clear of any exudate or lesions.   Neck: supple, no masses  Respiratory: Speaking in complete sentences. Expiratory wheezes.   Cardiovascular: S1 & S2 heard, regular rate and rhythm. No extremity edema.   Abdomen: No tenderness, soft. Bowel sounds active.  Musculoskeletal: no clubbing / cyanosis. No joint deformity upper and lower extremities.   Skin: no significant rashes, lesions, ulcers. Warm, dry,  well-perfused. Neurologic: CN 2-12 grossly intact. Moving all extremities. Alert and oriented.  Psychiatric: Calm. Cooperative.    Labs and Imaging on Admission: I have personally reviewed following labs and imaging studies  CBC: Recent Labs  Lab 01/22/24 2037  WBC 3.3*  HGB 14.2  HCT 43.1  MCV 91.9  PLT 105*   Basic Metabolic Panel: Recent Labs  Lab 01/22/24 2037  NA 134*  K 3.9  CL 98  CO2 27  GLUCOSE 87  BUN 22  CREATININE 1.24  CALCIUM  8.8*   GFR: Estimated Creatinine Clearance: 50.7 mL/min (by C-G formula based on SCr of 1.24 mg/dL). Liver Function Tests: No results for input(s): AST, ALT, ALKPHOS, BILITOT, PROT, ALBUMIN in the last 168 hours. No results for input(s): LIPASE, AMYLASE in the last 168 hours. No results for input(s): AMMONIA in the last 168 hours. Coagulation Profile: No results for input(s): INR, PROTIME in the last 168 hours. Cardiac Enzymes: No results for input(s): CKTOTAL, CKMB, CKMBINDEX,  TROPONINI in the last 168 hours. BNP (last 3 results) No results for input(s): PROBNP in the last 8760 hours. HbA1C: No results for input(s): HGBA1C in the last 72 hours. CBG: No results for input(s): GLUCAP in the last 168 hours. Lipid Profile: No results for input(s): CHOL, HDL, LDLCALC, TRIG, CHOLHDL, LDLDIRECT in the last 72 hours. Thyroid  Function Tests: No results for input(s): TSH, T4TOTAL, FREET4, T3FREE, THYROIDAB in the last 72 hours. Anemia Panel: No results for input(s): VITAMINB12, FOLATE, FERRITIN, TIBC, IRON, RETICCTPCT in the last 72 hours. Urine analysis:    Component Value Date/Time   COLORURINE YELLOW 05/02/2019 0629   APPEARANCEUR Clear 10/20/2023 0000   LABSPEC 1.020 05/02/2019 0629   PHURINE 5.0 05/02/2019 0629   GLUCOSEU Negative 10/20/2023 0000   HGBUR SMALL (A) 05/02/2019 0629   BILIRUBINUR Negative 10/20/2023 0000   KETONESUR NEGATIVE 05/02/2019 0629   PROTEINUR Negative 10/20/2023 0000   PROTEINUR NEGATIVE 05/02/2019 0629   UROBILINOGEN negative (A) 07/14/2019 1449   NITRITE Negative 10/20/2023 0000   NITRITE NEGATIVE 05/02/2019 0629   LEUKOCYTESUR Negative 10/20/2023 0000   LEUKOCYTESUR NEGATIVE 05/02/2019 0629   Sepsis Labs: @LABRCNTIP (procalcitonin:4,lacticidven:4) ) Recent Results (from the past 240 hours)  Resp panel by RT-PCR (RSV, Flu A&B, Covid) Anterior Nasal Swab     Status: Abnormal   Collection Time: 01/22/24  8:31 PM   Specimen: Anterior Nasal Swab  Result Value Ref Range Status   SARS Coronavirus 2 by RT PCR NEGATIVE NEGATIVE Final    Comment: (NOTE) SARS-CoV-2 target nucleic acids are NOT DETECTED.  The SARS-CoV-2 RNA is generally detectable in upper respiratory specimens during the acute phase of infection. The lowest concentration of SARS-CoV-2 viral copies this assay can detect is 138 copies/mL. A negative result does not preclude SARS-Cov-2 infection and should not be used as the  sole basis for treatment or other patient management decisions. A negative result may occur with  improper specimen collection/handling, submission of specimen other than nasopharyngeal swab, presence of viral mutation(s) within the areas targeted by this assay, and inadequate number of viral copies(<138 copies/mL). A negative result must be combined with clinical observations, patient history, and epidemiological information. The expected result is Negative.  Fact Sheet for Patients:  bloggercourse.com  Fact Sheet for Healthcare Providers:  seriousbroker.it  This test is no t yet approved or cleared by the [place]  FDA and  has been authorized for detection and/or diagnosis of SARS-CoV-2 by FDA under an Emergency Use Authorization (EUA). This EUA will remain  in effect (meaning this test can be used) for the duration of the COVID-19 declaration under Section 564(b)(1) of the Act, 21 U.S.C.section 360bbb-3(b)(1), unless the authorization is terminated  or revoked sooner.       Influenza A by PCR POSITIVE (A) NEGATIVE Final   Influenza B by PCR NEGATIVE NEGATIVE Final    Comment: (NOTE) The Xpert Xpress SARS-CoV-2/FLU/RSV plus assay is intended as an aid in the diagnosis of influenza from Nasopharyngeal swab specimens and should not be used as a sole basis for treatment. Nasal washings and aspirates are unacceptable for Xpert Xpress SARS-CoV-2/FLU/RSV testing.  Fact Sheet for Patients: bloggercourse.com  Fact Sheet for Healthcare Providers: seriousbroker.it  This test is not yet approved or cleared by the [place]  FDA and has been authorized for detection and/or diagnosis of SARS-CoV-2 by FDA under an Emergency Use Authorization (EUA). This EUA will remain in effect (meaning this test can be used) for the duration of the COVID-19 declaration under Section 564(b)(1) of  the Act, 21 U.S.C. section 360bbb-3(b)(1), unless the authorization is terminated or revoked.     Resp Syncytial Virus by PCR NEGATIVE NEGATIVE Final    Comment: (NOTE) Fact Sheet for Patients: bloggercourse.com  Fact Sheet for Healthcare Providers: seriousbroker.it  This test is not yet approved or cleared by the [place]  FDA and has been authorized for detection and/or diagnosis of SARS-CoV-2 by FDA under an Emergency Use Authorization (EUA). This EUA will remain in effect (meaning this test can be used) for the duration of the COVID-19 declaration under Section 564(b)(1) of the Act, 21 U.S.C. section 360bbb-3(b)(1), unless the authorization is terminated or revoked.  Performed at [facility], [address] [place], [place] [address]      Radiological Exams on Admission: DG Chest 2 View Result Date: 01/22/2024 EXAM: 2 VIEW(S) XRAY OF THE CHEST 01/22/2024 08:50:00 PM COMPARISON: 12/09/2022 CLINICAL HISTORY: shortness of breath FINDINGS: LUNGS AND PLEURA: Elevated right hemidiaphragm, unchanged. No focal pulmonary opacity. No pleural effusion. No pneumothorax. HEART AND MEDIASTINUM: Prosthetic aortic valve noted. No acute abnormality of the cardiac and mediastinal silhouettes. BONES AND SOFT TISSUES: No acute osseous abnormality. IMPRESSION: 1. No acute cardiopulmonary findings. 2. Chronic elevation of the right hemidiaphragm, unchanged. 3. Prosthetic aortic valve. Electronically signed by: Dorethia Molt MD 01/22/2024 09:35 PM EST RP Workstation: [id number]    EKG: Independently reviewed. Sinus rhythm.   Assessment/Plan   1. Influenza A  - Symptoms began 12/24, he tested positive for influenza A and was started on Tamiflu  on 12/26, now presents with increased SOB and wheezing  - Continue Tamiflu , droplet precautions, supportive care    2. Leukopenia; thrombocytopenia  - WBC is 3,300 and platelets 105,000 on  admission - Likely from influenza A infection, will request smear review and trend CBCs    3. Hx of TIA  - Continue Plavix     4. Chronic HFpEF  - Appears compensated    5. Hypothyroidism  - Continue Synthroid      DVT prophylaxis: Lovenox   Code Status: Full  Level of Care: Level of care: Med-Surg Family Communication: None present   Disposition Plan:  Patient is from: Home  Anticipated d/c is to: home  Anticipated d/c date is: 01/24/24  Patient currently: Pending clinical stability  Consults called: None  Admission status: Observation     Evalene GORMAN Sprinkles, MD [facility]  01/23/2024, 4:17 AM       [1] No Known Allergies  "

## 2024-01-23 NOTE — Plan of Care (Signed)
?  Problem: Clinical Measurements: ?Goal: Will remain free from infection ?Outcome: Progressing ?Goal: Diagnostic test results will improve ?Outcome: Progressing ?Goal: Respiratory complications will improve ?Outcome: Progressing ?  ?

## 2024-01-23 NOTE — Care Management Obs Status (Signed)
 MEDICARE OBSERVATION STATUS NOTIFICATION   Patient Details  Name: JAHRON HUNSINGER MRN: [id number] Date of Birth: 08-21-1940   Medicare Observation Status Notification Given:  Yes    Sonda Manuella Quill, RN 01/23/2024, 4:20 PM

## 2024-01-24 DIAGNOSIS — J101 Influenza due to other identified influenza virus with other respiratory manifestations: Secondary | ICD-10-CM | POA: Diagnosis not present

## 2024-01-24 LAB — BASIC METABOLIC PANEL WITH GFR
Anion gap: 13 (ref 5–15)
BUN: 17 mg/dL (ref 8–23)
CO2: 19 mmol/L — ABNORMAL LOW (ref 22–32)
Calcium: 8.9 mg/dL (ref 8.9–10.3)
Chloride: 106 mmol/L (ref 98–111)
Creatinine, Ser: 0.87 mg/dL (ref 0.61–1.24)
GFR, Estimated: >60 mL/min
Glucose, Bld: 91 mg/dL (ref 70–99)
Potassium: 4.1 mmol/L (ref 3.5–5.1)
Sodium: 138 mmol/L (ref 135–145)

## 2024-01-24 LAB — CBC WITH DIFFERENTIAL/PLATELET
Abs Immature Granulocytes: 0.01 K/uL (ref 0.00–0.07)
Basophils Absolute: 0 K/uL (ref 0.0–0.1)
Basophils Relative: 1 %
Eosinophils Absolute: 0 K/uL (ref 0.0–0.5)
Eosinophils Relative: 2 %
HCT: 41 % (ref 39.0–52.0)
Hemoglobin: 13.6 g/dL (ref 13.0–17.0)
Immature Granulocytes: 0 %
Lymphocytes Relative: 36 %
Lymphs Abs: 1 K/uL (ref 0.7–4.0)
MCH: 30.2 pg (ref 26.0–34.0)
MCHC: 33.2 g/dL (ref 30.0–36.0)
MCV: 91.1 fL (ref 80.0–100.0)
Monocytes Absolute: 0.4 K/uL (ref 0.1–1.0)
Monocytes Relative: 13 %
Neutro Abs: 1.3 K/uL — ABNORMAL LOW (ref 1.7–7.7)
Neutrophils Relative %: 48 %
Platelets: 102 K/uL — ABNORMAL LOW (ref 150–400)
RBC: 4.5 MIL/uL (ref 4.22–5.81)
RDW: 12.9 % (ref 11.5–15.5)
WBC: 2.7 K/uL — ABNORMAL LOW (ref 4.0–10.5)
nRBC: 0 % (ref 0.0–0.2)

## 2024-01-24 MED ORDER — OSELTAMIVIR PHOSPHATE 75 MG PO CAPS
75.0000 mg | ORAL_CAPSULE | Freq: Two times a day (BID) | ORAL | Status: AC
  Administered 2024-01-24 – 2024-01-26 (×5): 75 mg via ORAL
  Filled 2024-01-24 (×5): qty 1

## 2024-01-24 NOTE — Progress Notes (Signed)
 Consultation Progress Note   Patient: Andrew Watson FMW:[id number] DOB: May 18, 1940 DOA: 01/22/2024 DOS: the patient was seen and examined on 01/24/2024 Primary service: Kensey Luepke, Landon BRAVO, MD  Brief hospital course: Patient is an 83 year old male who presented with upper respiratory tract symptoms including coughing rhinorrhea since 12/24.  He was found to be influenza A positive.  Chest x-ray showed no acute cardiopulmonary findings, upon admission patient was noted to be septic due to being hypotensive on admission.  Patient started on Tamiflu  and IV fluids.  Assessment and Plan: Influenza A infection. -Presented with URI symptoms and diarrhea. - Continue Tamiflu , he is clinically improving. - BP was low upon admission today it is stable. - Manage symptoms, continue droplet precautions.  Leukopenia and thrombocytopenia likely due to viral illness.  AKI-resolved  Chronic congestive heart failure with preserved ejection fraction. Previous AV valve repair in 2021. Follow-up cardiology outpatient.   A-fib in chart-last office note 04/27/2023 indicates SVT mainly with PACs PVCs apparently not on anticoagulation Previous TIA Continue Plavix , rate controlled not on anticoagulation.  Morbid obesity BMI of 33 class II/class I   Gout         [facility] will continue to follow the patient.  Subjective: Seen at bedside this morning he feels well, appears to be weak.  Physical Exam: Vitals:   01/23/24 0355 01/23/24 1328 01/23/24 2116 01/24/24 0503  BP: 103/81 129/80  114/77  Pulse: 87 80 80 77  Resp: 16 16 18 17   Temp: 97.8 F (36.6 C) (!) 97.5 F (36.4 C)  97.6 F (36.4 C)  TempSrc: Oral   Oral  SpO2: 96% 97% 95% 95%  Weight: 99.3 kg     Height: 5' 8 (1.727 m)      General  No acute Distress Eyes: PERRL, lids and conjunctivae normal ENMT: Mucous membranes are moist.   Neck: normal, supple, no masses, no thyromegaly Respiratory: clear to auscultation bilaterally, no  wheezing, no crackles. Normal respiratory effort. No accessory muscle use.  Cardiovascular: Regular rate and rhythm, no murmurs / rubs / gallops Abdomen: Soft, nontender nondistended. Musculoskeletal: no clubbing / cyanosis. No joint deformity upper and lower extremities.  Skin: no rashes, lesions, ulcers. No induration Neurologic: Facial asymmetry, moving extremity spontaneously, speech fluent. Psychiatric: Normal judgment and insight. Alert and oriented x 3. Normal mood.   Data Reviewed:    CBC    Component Value Date/Time   WBC 2.7 (L) 01/24/2024 0428   RBC 4.50 01/24/2024 0428   HGB 13.6 01/24/2024 0428   HGB 13.0 02/24/2019 1359   HCT 41.0 01/24/2024 0428   HCT 39.0 02/24/2019 1359   PLT 102 (L) 01/24/2024 0428   PLT 149 (L) 02/24/2019 1359   MCV 91.1 01/24/2024 0428   MCV 90 02/24/2019 1359   MCH 30.2 01/24/2024 0428   MCHC 33.2 01/24/2024 0428   RDW 12.9 01/24/2024 0428   RDW 13.1 02/24/2019 1359   LYMPHSABS 1.0 01/24/2024 0428   MONOABS 0.4 01/24/2024 0428   EOSABS 0.0 01/24/2024 0428   BASOSABS 0.0 01/24/2024 0428   CMP     Component Value Date/Time   NA 138 01/24/2024 0428   NA 143 02/24/2019 1359   K 4.1 01/24/2024 0428   CL 106 01/24/2024 0428   CO2 19 (L) 01/24/2024 0428   GLUCOSE 91 01/24/2024 0428   BUN 17 01/24/2024 0428   BUN 21 02/24/2019 1359   CREATININE 0.87 01/24/2024 0428   CREATININE 1.03 09/24/2020 0921   CALCIUM  8.9 01/24/2024 0428  PROT 7.8 09/15/2021 1530   ALBUMIN 4.3 09/15/2021 1530   AST 22 09/15/2021 1530   ALT 25 09/15/2021 1530   ALKPHOS 46 09/15/2021 1530   BILITOT 1.2 09/15/2021 1530   GFRNONAA >60 01/24/2024 0428     Time spent: 37 minutes.  Author: Landon FORBES Baller, MD 01/24/2024 9:07 AM  For on call review [email or web address].

## 2024-01-24 NOTE — Evaluation (Signed)
 Physical Therapy Evaluation Only Patient Details Name: Andrew Watson MRN: [id number] DOB: 15-Feb-1940 Today's Date: 01/24/2024  History of Present Illness  Andrew Watson is an 83 y.o. male who presents with cough and worsening shortness of breath and wheezing; tested positive for influenza A. EFY:dzczmz AS status post TAVR, remote PE and atrial fibrillation no longer anticoagulated, TIA, CHF, and hypothyroidism  Clinical Impression  Pt reports from home alone, ind without AD at baseline, active around his home and beach home, ind with ADLs/IADLs, prefers to eat out, has good family/friend support. Pt modified ind to ind with all mobility and no AD. Pt able to navigate room/bathroom setup and hallway obstacles without difficulty, no LOB. Pt with some coughing, reports a little shortness of breath, on RA with SpO2 98% and HR 85 post ambulation. Educated pt on pursed lip breathing and increased activity as tolerable. Educated on time OOB and ambulating in hallway with nursing as able and pt and nursing verbalizes understanding.  No acute PT services identified, no f/u recommended. Will sign off at this time.       If plan is discharge home, recommend the following:     Can travel by private vehicle        Equipment Recommendations None recommended by PT  Recommendations for Other Services       Functional Status Assessment Patient has had a recent decline in their functional status and demonstrates the ability to make significant improvements in function in a reasonable and predictable amount of time.     Precautions / Restrictions Precautions Precautions: Fall Recall of Precautions/Restrictions: Intact Restrictions Weight Bearing Restrictions Per Provider Order: No      Mobility  Bed Mobility Overal bed mobility: Modified Independent                  Transfers Overall transfer level: Modified independent Equipment used: None               General  transfer comment: BUE assisting to power to stand    Ambulation/Gait Ambulation/Gait assistance: Modified independent (Device/Increase time) Gait Distance (Feet): 350 Feet Assistive device: None Gait Pattern/deviations: WFL(Within Functional Limits) Gait velocity: WFL     General Gait Details: step through gait pattern, able to navigate room/bathroom set up and obstacles in hallway without AD, cadence WFL, rounded shoudlers with forward head posture  Stairs            Wheelchair Mobility     Tilt Bed    Modified Rankin (Stroke Patients Only)       Balance Overall balance assessment: No apparent balance deficits (not formally assessed)                                           Pertinent Vitals/Pain Pain Assessment Pain Assessment: No/denies pain    Home Living Family/patient expects to be discharged to:: Private residence Living Arrangements: Alone Available Help at Discharge: Family Type of Home: House Home Access: Level entry       Home Layout: One level Home Equipment: Cane - single point      Prior Function Prior Level of Function : Independent/Modified Independent;Driving             Mobility Comments: pt reports ind with household and community ambulation, active at his home and beach home ADLs Comments: pt reports ind with ADLs/IADLs, prefers to eat out  over cooking     Extremity/Trunk Assessment   Upper Extremity Assessment Upper Extremity Assessment: Overall WFL for tasks assessed    Lower Extremity Assessment Lower Extremity Assessment: Overall WFL for tasks assessed    Cervical / Trunk Assessment Cervical / Trunk Assessment: Kyphotic  Communication   Communication Communication: No apparent difficulties    Cognition Arousal: Alert Behavior During Therapy: WFL for tasks assessed/performed   PT - Cognitive impairments: No apparent impairments                         Following commands: Intact        Cueing       General Comments General comments (skin integrity, edema, etc.): Pt on RA, Spo2 98% and HR 85 post ambulation    Exercises     Assessment/Plan    PT Assessment Patient does not need any further PT services  PT Problem List         PT Treatment Interventions      PT Goals (Current goals can be found in the Care Plan section)  Acute Rehab PT Goals Patient Stated Goal: go home today PT Goal Formulation: All assessment and education complete, DC therapy    Frequency       Co-evaluation               AM-PAC PT 6 Clicks Mobility  Outcome Measure Help needed turning from your back to your side while in a flat bed without using bedrails?: None Help needed moving from lying on your back to sitting on the side of a flat bed without using bedrails?: None Help needed moving to and from a bed to a chair (including a wheelchair)?: None Help needed standing up from a chair using your arms (e.g., wheelchair or bedside chair)?: None Help needed to walk in hospital room?: A Little Help needed climbing 3-5 steps with a railing? : A Little 6 Click Score: 22    End of Session Equipment Utilized During Treatment: Gait belt Activity Tolerance: Patient tolerated treatment well Patient left: in chair;with call bell/phone within reach Nurse Communication: Mobility status PT Visit Diagnosis: Other abnormalities of gait and mobility (R26.89)    Time: 9068-9046 PT Time Calculation (min) (ACUTE ONLY): 22 min   Charges:   PT Evaluation $PT Eval Low Complexity: 1 Low   PT General Charges $$ ACUTE PT VISIT: 1 Visit         Tori Bron Snellings PT, DPT 01/24/2024, 10:03 AM

## 2024-01-24 NOTE — Plan of Care (Signed)

## 2024-01-25 ENCOUNTER — Observation Stay (HOSPITAL_COMMUNITY)

## 2024-01-25 DIAGNOSIS — J101 Influenza due to other identified influenza virus with other respiratory manifestations: Secondary | ICD-10-CM | POA: Diagnosis not present

## 2024-01-25 LAB — BASIC METABOLIC PANEL WITH GFR
Anion gap: 10 (ref 5–15)
BUN: 16 mg/dL (ref 8–23)
CO2: 24 mmol/L (ref 22–32)
Calcium: 9.3 mg/dL (ref 8.9–10.3)
Chloride: 106 mmol/L (ref 98–111)
Creatinine, Ser: 0.89 mg/dL (ref 0.61–1.24)
GFR, Estimated: >60 mL/min
Glucose, Bld: 97 mg/dL (ref 70–99)
Potassium: 4.5 mmol/L (ref 3.5–5.1)
Sodium: 140 mmol/L (ref 135–145)

## 2024-01-25 LAB — CBC WITH DIFFERENTIAL/PLATELET
Abs Immature Granulocytes: 0.01 K/uL (ref 0.00–0.07)
Basophils Absolute: 0 K/uL (ref 0.0–0.1)
Basophils Relative: 1 %
Eosinophils Absolute: 0.1 K/uL (ref 0.0–0.5)
Eosinophils Relative: 2 %
HCT: 39.4 % (ref 39.0–52.0)
Hemoglobin: 12.9 g/dL — ABNORMAL LOW (ref 13.0–17.0)
Immature Granulocytes: 0 %
Lymphocytes Relative: 42 %
Lymphs Abs: 1 K/uL (ref 0.7–4.0)
MCH: 30.1 pg (ref 26.0–34.0)
MCHC: 32.7 g/dL (ref 30.0–36.0)
MCV: 91.8 fL (ref 80.0–100.0)
Monocytes Absolute: 0.3 K/uL (ref 0.1–1.0)
Monocytes Relative: 12 %
Neutro Abs: 1 K/uL — ABNORMAL LOW (ref 1.7–7.7)
Neutrophils Relative %: 43 %
Platelets: 102 K/uL — ABNORMAL LOW (ref 150–400)
RBC: 4.29 MIL/uL (ref 4.22–5.81)
RDW: 12.8 % (ref 11.5–15.5)
WBC: 2.5 K/uL — ABNORMAL LOW (ref 4.0–10.5)
nRBC: 0 % (ref 0.0–0.2)

## 2024-01-25 MED ORDER — BENZONATATE 100 MG PO CAPS
100.0000 mg | ORAL_CAPSULE | Freq: Once | ORAL | Status: AC
  Administered 2024-01-25: 100 mg via ORAL
  Filled 2024-01-25: qty 1

## 2024-01-25 NOTE — Progress Notes (Signed)
 Consultation Progress Note   Patient: Andrew Watson FMW:[id number] DOB: 1940-09-14 DOA: 01/22/2024 DOS: the patient was seen and examined on 01/25/2024 Primary service: DibiaLandon BRAVO, MD  Brief hospital course: Patient is an 83 year old male who presented with upper respiratory tract symptoms including coughing rhinorrhea since 12/24.  He was found to be influenza A positive.  Chest x-ray showed no acute cardiopulmonary findings, upon admission patient was noted to be septic due to being hypotensive on admission.  Patient started on Tamiflu  and IV fluids.  Assessment and Plan: Influenza A infection. -Presented with URI symptoms and diarrhea. - Continue Tamiflu , diarrhea has resolved, but continues to have a hacking cough. He appears very weak. - BP was low upon admission today it is stable. - Manage symptoms, continue droplet precautions. -Obtain repeat CXR  Leukopenia and thrombocytopenia likely due to viral illness.  AKI-resolved  Chronic congestive heart failure with preserved ejection fraction. Previous AV valve repair in 2021. Follow-up cardiology outpatient.   A-fib in chart-last office note 04/27/2023 indicates SVT mainly with PACs PVCs apparently not on anticoagulation Previous TIA Continue Plavix , rate controlled not on anticoagulation.  Morbid obesity BMI of 33 class II/class I   Gout         [facility] will continue to follow the patient.  Subjective: Seen at bedside this morning he feels well, appears to be weak and he was coughing a lot. He has no sputum production.Denies fever, chills, nausea, vomiting.  Physical Exam: Vitals:   01/24/24 1323 01/24/24 2130 01/25/24 0456 01/25/24 1245  BP: 109/74 122/71 126/71 122/72  Pulse: 77 76 64 63  Resp: 19  16 19   Temp: (!) 97.4 F (36.3 C) 99.5 F (37.5 C) 97.9 F (36.6 C) 97.8 F (36.6 C)  TempSrc: Oral Oral Oral Oral  SpO2: 97% 97% 97% 96%  Weight:      Height:       General  No acute Distress Eyes:  PERRL, lids and conjunctivae normal ENMT: Mucous membranes are moist.   Neck: normal, supple, no masses, no thyromegaly Respiratory:Diminished breath sounds bilaterally Cardiovascular: Regular rate and rhythm, no murmurs / rubs / gallops Abdomen: Soft, nontender nondistended. Musculoskeletal: no clubbing / cyanosis. No joint deformity upper and lower extremities.  Skin: no rashes, lesions, ulcers. No induration Neurologic: Facial asymmetry, moving extremity spontaneously, speech fluent. Psychiatric: Normal judgment and insight. Alert and oriented x 3. Normal mood.   Data Reviewed:    CBC    Component Value Date/Time   WBC 2.5 (L) 01/25/2024 0443   RBC 4.29 01/25/2024 0443   HGB 12.9 (L) 01/25/2024 0443   HGB 13.0 02/24/2019 1359   HCT 39.4 01/25/2024 0443   HCT 39.0 02/24/2019 1359   PLT 102 (L) 01/25/2024 0443   PLT 149 (L) 02/24/2019 1359   MCV 91.8 01/25/2024 0443   MCV 90 02/24/2019 1359   MCH 30.1 01/25/2024 0443   MCHC 32.7 01/25/2024 0443   RDW 12.8 01/25/2024 0443   RDW 13.1 02/24/2019 1359   LYMPHSABS 1.0 01/25/2024 0443   MONOABS 0.3 01/25/2024 0443   EOSABS 0.1 01/25/2024 0443   BASOSABS 0.0 01/25/2024 0443   CMP     Component Value Date/Time   NA 140 01/25/2024 0443   NA 143 02/24/2019 1359   K 4.5 01/25/2024 0443   CL 106 01/25/2024 0443   CO2 24 01/25/2024 0443   GLUCOSE 97 01/25/2024 0443   BUN 16 01/25/2024 0443   BUN 21 02/24/2019 1359   CREATININE 0.89  01/25/2024 0443   CREATININE 1.03 09/24/2020 0921   CALCIUM  9.3 01/25/2024 0443   PROT 7.8 09/15/2021 1530   ALBUMIN 4.3 09/15/2021 1530   AST 22 09/15/2021 1530   ALT 25 09/15/2021 1530   ALKPHOS 46 09/15/2021 1530   BILITOT 1.2 09/15/2021 1530   GFRNONAA >60 01/25/2024 0443    Disposition-Likely DC tomorrow pending CXR and improvement with patients symptoms.  Time spent: 37 minutes.  Author: Landon FORBES Baller, MD 01/25/2024 1:49 PM  For on call review [email or web address].

## 2024-01-25 NOTE — Plan of Care (Signed)

## 2024-01-26 DIAGNOSIS — J101 Influenza due to other identified influenza virus with other respiratory manifestations: Secondary | ICD-10-CM | POA: Diagnosis not present

## 2024-01-26 LAB — CBC WITH DIFFERENTIAL/PLATELET
Abs Immature Granulocytes: 0.02 K/uL (ref 0.00–0.07)
Basophils Absolute: 0 K/uL (ref 0.0–0.1)
Basophils Relative: 0 %
Eosinophils Absolute: 0 K/uL (ref 0.0–0.5)
Eosinophils Relative: 0 %
HCT: 38.1 % — ABNORMAL LOW (ref 39.0–52.0)
Hemoglobin: 12.7 g/dL — ABNORMAL LOW (ref 13.0–17.0)
Immature Granulocytes: 0 %
Lymphocytes Relative: 12 %
Lymphs Abs: 0.8 K/uL (ref 0.7–4.0)
MCH: 30.2 pg (ref 26.0–34.0)
MCHC: 33.3 g/dL (ref 30.0–36.0)
MCV: 90.7 fL (ref 80.0–100.0)
Monocytes Absolute: 0.9 K/uL (ref 0.1–1.0)
Monocytes Relative: 12 %
Neutro Abs: 5.5 K/uL (ref 1.7–7.7)
Neutrophils Relative %: 76 %
Platelets: 97 K/uL — ABNORMAL LOW (ref 150–400)
RBC: 4.2 MIL/uL — ABNORMAL LOW (ref 4.22–5.81)
RDW: 12.8 % (ref 11.5–15.5)
Smear Review: NORMAL
WBC: 7.2 K/uL (ref 4.0–10.5)
nRBC: 0 % (ref 0.0–0.2)

## 2024-01-26 LAB — BASIC METABOLIC PANEL WITH GFR
Anion gap: 9 (ref 5–15)
BUN: 19 mg/dL (ref 8–23)
CO2: 25 mmol/L (ref 22–32)
Calcium: 9.2 mg/dL (ref 8.9–10.3)
Chloride: 104 mmol/L (ref 98–111)
Creatinine, Ser: 0.92 mg/dL (ref 0.61–1.24)
GFR, Estimated: >60 mL/min
Glucose, Bld: 108 mg/dL — ABNORMAL HIGH (ref 70–99)
Potassium: 4.5 mmol/L (ref 3.5–5.1)
Sodium: 137 mmol/L (ref 135–145)

## 2024-01-26 MED ORDER — GUAIFENESIN-DM 100-10 MG/5ML PO SYRP
5.0000 mL | ORAL_SOLUTION | ORAL | Status: DC | PRN
  Administered 2024-01-26 – 2024-01-27 (×4): 5 mL via ORAL
  Filled 2024-01-26 (×5): qty 5

## 2024-01-26 MED ORDER — LIDOCAINE VISCOUS HCL 2 % MT SOLN
15.0000 mL | Freq: Once | OROMUCOSAL | Status: AC
  Administered 2024-01-26: 15 mL via OROMUCOSAL
  Filled 2024-01-26: qty 15

## 2024-01-26 NOTE — Plan of Care (Signed)

## 2024-01-26 NOTE — Progress Notes (Signed)
 [facility] PROGRESS NOTE    Progress Note  Andrew Watson  FMW:[id number] DOB: 11/03/1940 DOA: 01/22/2024 PCP: Seabron Lenis, MD     Brief Narrative:   Andrew Watson is an 83 y.o. male past medical history of the flu presented with upper respiratory tract infection and cough since the 24th was found to be flu positive chest x-ray showed no acute findings.  Continues to have a persistent cough was admitted due to concern for sepsis was started on IV fluids and Tamiflu .  Repeated chest x-ray showed no acute findings   Assessment/Plan:   Influenza A Started on IV fluids and Tamiflu . BP was low on admission but is now resolved. Continues to have persistent recurrent cough which is productive. Has remained afebrile, leukopenia has improved. Continue monitor fever curve. Continue current to have a persistent cough productive and is complaining of shortness of breath. Ambulating check saturations with ambulation.  Acute kidney injury: Now resolved with IV fluids.  Chronic HFpEF: Resume home meds.  Paroxysmal atrial fibrillation: Not on anticoagulation currently on Plavix  Rate controlled unknown cause of why he is not on anticoagulation.  Thrombocytopenia Chronic appears to be stable follow-up with PCP as an outpatient.  Leukopenia Has remained afebrile this has resolved with conservative treatment. Question due to viral infection.   DVT prophylaxis: lovenox  Family Communication: The daughter Status is: Observation The patient remains OBS appropriate and will d/c before 2 midnights.    Code Status:     Code Status Orders  (From admission, onward)           Start     Ordered   01/23/24 0355  Full code  Continuous       Question:  By:  Answer:  Consent: discussion documented in EHR   01/23/24 0355           Code Status History     Date Active Date Inactive Code Status Order ID Comments User Context   05/18/2022 1522 05/19/2022 1528 Full Code  [id number]  Reena Roxie CROME, [place] Inpatient   05/02/2019 1211 05/03/2019 1647 Full Code [id number]  Sebastian Lamarr JONELLE DEVONNA Inpatient   02/28/2019 1511 02/28/2019 2126 Full Code [id number]  Verlin Lonni BIRCH, MD Inpatient   04/20/2016 0243 04/22/2016 1636 Full Code [id number]  Lenis Maxwell LABOR, MD Inpatient   11/24/2015 2223 11/26/2015 1446 Full Code [id number]  Geralynn Charleston, MD Inpatient   06/26/2015 1856 07/12/2015 1648 Full Code [id number]  Theophilus Delma Tully CINDERELLA, MD Inpatient   05/07/2011 0810 05/09/2011 1611 Full Code [id number]  Caswell Ronnell BROCKS, MD ED         IV Access:   Peripheral IV   Procedures and diagnostic studies:   DG CHEST PORT 1 VIEW Result Date: 01/25/2024 CLINICAL DATA:  Cough. EXAM: PORTABLE CHEST 1 VIEW COMPARISON:  01/22/2024 FINDINGS: Unchanged elevation of right hemidiaphragm.The cardiomediastinal contours are stable. TAVR. Pulmonary vasculature is normal. No consolidation, pleural effusion, or pneumothorax. No acute osseous abnormalities are seen. IMPRESSION: No active disease. Electronically Signed   By: Andrea Gasman M.D.   On: 01/25/2024 16:26     Medical Consultants:   None.   Subjective:    Andrew Watson relates he continues to have a persistent cough  Objective:    Vitals:   01/25/24 1245 01/25/24 1956 01/25/24 2002 01/26/24 0439  BP: 122/72 (!) 161/96  120/70  Pulse: 63 98  86  Resp: 19 (!) 22  (!) 24  Temp: 97.8 F (36.6 C)  97.7  F (36.5 C) 98.4 F (36.9 C)  TempSrc: Oral  Axillary   SpO2: 96% 96%  96%  Weight:      Height:       SpO2: 96 %  No intake or output data in the 24 hours ending 01/26/24 1146 Filed Weights   01/22/24 2028 01/23/24 0355  Weight: 99.3 kg 99.3 kg    Exam: General exam: In no acute distress. Respiratory system: Good air movement and clear to auscultation. Cardiovascular system: S1 & S2 heard, RRR. No JVD. Gastrointestinal system: Abdomen is nondistended, soft and nontender.  Extremities: No  pedal edema. Skin: No rashes, lesions or ulcers Psychiatry: Judgement and insight appear normal. Mood & affect appropriate.    Data Reviewed:    Labs: Basic Metabolic Panel: Recent Labs  Lab 01/22/24 2037 01/23/24 0510 01/24/24 0428 01/25/24 0443 01/26/24 0511  NA 134* 133* 138 140 137  K 3.9 4.0 4.1 4.5 4.5  CL 98 103 106 106 104  CO2 27 20* 19* 24 25  GLUCOSE 87 93 91 97 108*  BUN 22 24* 17 16 19   CREATININE 1.24 1.01 0.87 0.89 0.92  CALCIUM  8.8* 8.7* 8.9 9.3 9.2   GFR Estimated Creatinine Clearance: 68.3 mL/min (by C-G formula based on SCr of 0.92 mg/dL). Liver Function Tests: No results for input(s): AST, ALT, ALKPHOS, BILITOT, PROT, ALBUMIN in the last 168 hours. No results for input(s): LIPASE, AMYLASE in the last 168 hours. No results for input(s): AMMONIA in the last 168 hours. Coagulation profile No results for input(s): INR, PROTIME in the last 168 hours. COVID-19 Labs  No results for input(s): DDIMER, FERRITIN, LDH, CRP in the last 72 hours.  Lab Results  Component Value Date   SARSCOV2NAA NEGATIVE 01/22/2024   SARSCOV2NAA POSITIVE (A) 07/25/2022   SARSCOV2NAA POSITIVE (A) 03/13/2019   SARSCOV2NAA NEGATIVE 02/24/2019    CBC: Recent Labs  Lab 01/22/24 2037 01/23/24 0510 01/24/24 0428 01/25/24 0443 01/26/24 0511  WBC 3.3* 2.8* 2.7* 2.5* 7.2  NEUTROABS  --  1.6* 1.3* 1.0* 5.5  HGB 14.2 13.4 13.6 12.9* 12.7*  HCT 43.1 40.2 41.0 39.4 38.1*  MCV 91.9 91.6 91.1 91.8 90.7  PLT 105* 80* 102* 102* 97*   Cardiac Enzymes: No results for input(s): CKTOTAL, CKMB, CKMBINDEX, TROPONINI in the last 168 hours. BNP (last 3 results) No results for input(s): PROBNP in the last 8760 hours. CBG: No results for input(s): GLUCAP in the last 168 hours. D-Dimer: No results for input(s): DDIMER in the last 72 hours. Hgb A1c: No results for input(s): HGBA1C in the last 72 hours. Lipid Profile: No results for input(s):  CHOL, HDL, LDLCALC, TRIG, CHOLHDL, LDLDIRECT in the last 72 hours. Thyroid  function studies: No results for input(s): TSH, T4TOTAL, T3FREE, THYROIDAB in the last 72 hours.  Invalid input(s): FREET3 Anemia work up: No results for input(s): VITAMINB12, FOLATE, FERRITIN, TIBC, IRON, RETICCTPCT in the last 72 hours. Sepsis Labs: Recent Labs  Lab 01/22/24 2155 01/23/24 0510 01/24/24 0428 01/25/24 0443 01/26/24 0511  WBC  --  2.8* 2.7* 2.5* 7.2  LATICACIDVEN 1.3  --   --   --   --    Microbiology Recent Results (from the past 240 hours)  Resp panel by RT-PCR (RSV, Flu A&B, Covid) Anterior Nasal Swab     Status: Abnormal   Collection Time: 01/22/24  8:31 PM   Specimen: Anterior Nasal Swab  Result Value Ref Range Status   SARS Coronavirus 2 by RT PCR NEGATIVE NEGATIVE Final  Comment: (NOTE) SARS-CoV-2 target nucleic acids are NOT DETECTED.  The SARS-CoV-2 RNA is generally detectable in upper respiratory specimens during the acute phase of infection. The lowest concentration of SARS-CoV-2 viral copies this assay can detect is 138 copies/mL. A negative result does not preclude SARS-Cov-2 infection and should not be used as the sole basis for treatment or other patient management decisions. A negative result may occur with  improper specimen collection/handling, submission of specimen other than nasopharyngeal swab, presence of viral mutation(s) within the areas targeted by this assay, and inadequate number of viral copies(<138 copies/mL). A negative result must be combined with clinical observations, patient history, and epidemiological information. The expected result is Negative.  Fact Sheet for Patients:  bloggercourse.com  Fact Sheet for Healthcare Providers:  seriousbroker.it  This test is no t yet approved or cleared by the [place]  FDA and  has been authorized for detection and/or  diagnosis of SARS-CoV-2 by FDA under an Emergency Use Authorization (EUA). This EUA will remain  in effect (meaning this test can be used) for the duration of the COVID-19 declaration under Section 564(b)(1) of the Act, 21 U.S.C.section 360bbb-3(b)(1), unless the authorization is terminated  or revoked sooner.       Influenza A by PCR POSITIVE (A) NEGATIVE Final   Influenza B by PCR NEGATIVE NEGATIVE Final    Comment: (NOTE) The Xpert Xpress SARS-CoV-2/FLU/RSV plus assay is intended as an aid in the diagnosis of influenza from Nasopharyngeal swab specimens and should not be used as a sole basis for treatment. Nasal washings and aspirates are unacceptable for Xpert Xpress SARS-CoV-2/FLU/RSV testing.  Fact Sheet for Patients: bloggercourse.com  Fact Sheet for Healthcare Providers: seriousbroker.it  This test is not yet approved or cleared by the [place]  FDA and has been authorized for detection and/or diagnosis of SARS-CoV-2 by FDA under an Emergency Use Authorization (EUA). This EUA will remain in effect (meaning this test can be used) for the duration of the COVID-19 declaration under Section 564(b)(1) of the Act, 21 U.S.C. section 360bbb-3(b)(1), unless the authorization is terminated or revoked.     Resp Syncytial Virus by PCR NEGATIVE NEGATIVE Final    Comment: (NOTE) Fact Sheet for Patients: bloggercourse.com  Fact Sheet for Healthcare Providers: seriousbroker.it  This test is not yet approved or cleared by the [place]  FDA and has been authorized for detection and/or diagnosis of SARS-CoV-2 by FDA under an Emergency Use Authorization (EUA). This EUA will remain in effect (meaning this test can be used) for the duration of the COVID-19 declaration under Section 564(b)(1) of the Act, 21 U.S.C. section 360bbb-3(b)(1), unless the authorization is terminated  or revoked.  Performed at [facility], [address] [place], [place] [address]      Medications:    aspirin   325 mg Oral Daily   clopidogrel   75 mg Oral Daily   enoxaparin  (LOVENOX ) injection  40 mg Subcutaneous Q24H   fluticasone   2 spray Each Nare Daily   levothyroxine   175 mcg Oral Q0600   melatonin  3 mg Oral QHS   nystatin  cream  1 Application Topical BID   oseltamivir   75 mg Oral BID   pneumococcal 20-valent conjugate vaccine  0.5 mL Intramuscular Tomorrow-1000   Continuous Infusions:    LOS: 0 days   Erle Odell Castor  [facility]  01/26/2024, 11:46 AM

## 2024-01-26 NOTE — Evaluation (Signed)
 Physical Therapy Evaluation only  Patient Details Name: MATHIS CASHMAN MRN: [id number] DOB: 1940/06/21 Today's Date: 01/26/2024  History of Present Illness  SIDNEY KANN is an 83 y.o. male who presents with cough and worsening shortness of breath and wheezing; tested positive for influenza A. EFY:dzczmz AS status post TAVR, remote PE and atrial fibrillation no longer anticoagulated, TIA, CHF, and hypothyroidism  Clinical Impression  PT conducted eval only on 12/29, however re-ordered PT due to pt reports of progressive weakness, chest discomfort from cough. PT eval only completed today 12/31 and pt does not require further therapy services in hospital nor home setting. Pt is anticipated to d/c home on 01/27/2024 and has strong family support if needed. Pt is mod I for bed mobility, IND for sit to stand, IND for gait 500 feet and no overt LOB with O2 monitored on RA throughout intervention and maintained >/=90%. Pt admitted with above diagnosis.  Pt currently with functional limitations due to the deficits listed below (see PT Problem List). Patient evaluated by Physical Therapy with no further acute PT needs identified. All education has been completed and the patient and family have no further questions.  PT is signing off. Thank you for this referral.      If plan is discharge home, recommend the following:     Can travel by private vehicle        Equipment Recommendations None recommended by PT  Recommendations for Other Services       Functional Status Assessment Patient has had a recent decline in their functional status and/or demonstrates limited ability to make significant improvements in function in a reasonable and predictable amount of time     Precautions / Restrictions Precautions Precautions: Fall Recall of Precautions/Restrictions: Intact Restrictions Weight Bearing Restrictions Per Provider Order: No      Mobility  Bed Mobility Overal bed mobility: Modified  Independent             General bed mobility comments: supine to sit    Transfers Overall transfer level: Modified independent Equipment used: None               General transfer comment: BUE assisting to power to stand from EOB    Ambulation/Gait Ambulation/Gait assistance: Modified independent (Device/Increase time) Gait Distance (Feet): 500 Feet Assistive device: None Gait Pattern/deviations: WFL(Within Functional Limits) Gait velocity: WFL     General Gait Details: pt exhibited slight trunk flexion, good reciprocal pattern with LE passing in stance phase, appropriate foot clearance no overt LOB with pt reporting feeling a little weak and mild SOB and pt on RA with all mobiltiy tasks at time of eval >/=90%  Stairs            Wheelchair Mobility     Tilt Bed    Modified Rankin (Stroke Patients Only)       Balance Overall balance assessment: No apparent balance deficits (not formally assessed)                                           Pertinent Vitals/Pain Pain Assessment Pain Assessment: Faces Faces Pain Scale: Hurts a little bit Pain Location: chest secondary to frequent cough Pain Descriptors / Indicators: Aching, Discomfort, Grimacing Pain Intervention(s): Monitored during session    Home Living Family/patient expects to be discharged to:: Private residence Living Arrangements: Alone Available Help at Discharge: Family Type  of Home: House Home Access: Level entry       Home Layout: One level Home Equipment: Cane - single point Additional Comments: sock aid, pt required A to thread pants and doff socks seated EOB    Prior Function Prior Level of Function : Independent/Modified Independent;Driving             Mobility Comments: pt reports ind with household and community ambulation, active [place] home and [place] home ADLs Comments: pt reports ind with ADLs/IADLs, prefers to eat out over cooking      Extremity/Trunk Assessment        Lower Extremity Assessment Lower Extremity Assessment: Overall WFL for tasks assessed    Cervical / Trunk Assessment Cervical / Trunk Assessment: Kyphotic  Communication   Communication Communication: No apparent difficulties    Cognition Arousal: Alert Behavior During Therapy: WFL for tasks assessed/performed   PT - Cognitive impairments: No apparent impairments                         Following commands: Intact       Cueing       General Comments General comments (skin integrity, edema, etc.): RA throughout PT eval >/=90% and noted to have frequent and productive cough, MD in room and indicated cough may be persistant for up to 2 wks and no current dx of PNA    Exercises     Assessment/Plan    PT Assessment Patient does not need any further PT services  PT Problem List         PT Treatment Interventions      PT Goals (Current goals can be found in the Care Plan section)  Acute Rehab PT Goals Patient Stated Goal: I am tired of being in hospital and ready to go home PT Goal Formulation: All assessment and education complete, DC therapy Time For Goal Achievement: 02/03/24 Potential to Achieve Goals: Good    Frequency       Co-evaluation               AM-PAC PT 6 Clicks Mobility  Outcome Measure Help needed turning from your back to your side while in a flat bed without using bedrails?: None Help needed moving from lying on your back to sitting on the side of a flat bed without using bedrails?: None Help needed moving to and from a bed to a chair (including a wheelchair)?: None Help needed standing up from a chair using your arms (e.g., wheelchair or bedside chair)?: None Help needed to walk in hospital room?: None Help needed climbing 3-5 steps with a railing? : A Little 6 Click Score: 23    End of Session Equipment Utilized During Treatment: Gait belt Activity Tolerance: Patient tolerated  treatment well Patient left: in chair;with call bell/phone within reach;with family/visitor present Nurse Communication: Mobility status PT Visit Diagnosis: Other abnormalities of gait and mobility (R26.89)    Time: 8873-8843 PT Time Calculation (min) (ACUTE ONLY): 30 min   Charges:   PT Evaluation $PT Eval Low Complexity: 1 Low PT Treatments $Gait Training: 8-22 mins PT General Charges $$ ACUTE PT VISIT: 1 Visit         [place], PT Acute Rehab   [place] VEAR Drone 01/26/2024, 2:53 PM

## 2024-01-27 DIAGNOSIS — J101 Influenza due to other identified influenza virus with other respiratory manifestations: Secondary | ICD-10-CM | POA: Diagnosis not present

## 2024-01-27 MED ORDER — OSELTAMIVIR PHOSPHATE 6 MG/ML PO SUSR: 75.0000 mg | Freq: Every day | ORAL | 0 refills | Status: AC

## 2024-01-27 NOTE — Plan of Care (Signed)

## 2024-01-27 NOTE — Plan of Care (Signed)

## 2024-01-27 NOTE — TOC Transition Note (Signed)
 Transition of Care [facility]) - Discharge Note   Patient Details  Name: Andrew Watson MRN: [id number] Date of Birth: October 27, 1940  [facility] CM/SW Contact:  Bascom Service, RN Phone Number: 01/27/2024, 11:14 AM   Clinical Narrative: d/c home no CM needs.      Final next level of care: Home/Self Care Barriers to Discharge: No Barriers Identified   Patient Goals and CMS Choice Patient states their goals for this hospitalization and ongoing recovery are:: Home CMS Medicare.gov Compare Post Acute Care list provided to:: Patient Choice offered to / list presented to : Patient [place] ownership interest in [facility].provided to:: Patient    Discharge Placement                       Discharge Plan and Services Additional resources added to the After Visit Summary for     Discharge Planning Services: CM Consult            DME Arranged: N/A DME Agency: NA       HH Arranged: NA HH Agency: NA        Social Drivers of Health (SDOH) Interventions SDOH Screenings   Food Insecurity: No Food Insecurity (01/26/2024)  Housing: Low Risk (01/23/2024)  Transportation Needs: No Transportation Needs (01/23/2024)  Utilities: Not At Risk (01/23/2024)  Depression (PHQ2-9): Low Risk (11/27/2021)  Social Connections: Moderately Isolated (01/23/2024)  Tobacco Use: Low Risk (01/23/2024)     Readmission Risk Interventions     No data to display

## 2024-01-27 NOTE — Progress Notes (Signed)
 Mobility Specialist Progress Note:  RA   01/27/24 1057  Mobility  Activity Ambulated independently  Level of Assistance Independent  Assistive Device None  Distance Ambulated (ft) 500 ft  Activity Response Tolerated well  Mobility Referral Yes  Mobility visit 1 Mobility  Mobility Specialist Start Time (ACUTE ONLY) 0955  Mobility Specialist Stop Time (ACUTE ONLY) 1006  Mobility Specialist Time Calculation (min) (ACUTE ONLY) 11 min   Pt was received in bed and agreed to mobility. No complaints/issues during ambulation. Returned to bed with all needs met. Call bell in reach.  [employer] - Mobility Specialist

## 2024-01-27 NOTE — Discharge Summary (Signed)
 Physician Discharge Summary  Andrew Watson DOB: 03-26-1940 DOA: 01/22/2024  PCP: Andrew Lenis, MD  Admit date: 01/22/2024 Discharge date: 01/27/2024  Admitted From: Home Disposition:  Home  Recommendations for Outpatient Follow-up:  Follow up with PCP in 1-2 weeks Please obtain BMP/CBC in one week   Home Health:No Equipment/Devices:None  Discharge Condition:Stable CODE STATUS:FUll Diet recommendation: Heart Healthy   Brief/Interim Summary: 84 y.o. male past medical history of the flu presented with upper respiratory tract infection and cough since the 24th was found to be flu positive chest x-ray showed no acute findings.  Continues to have a persistent cough was admitted due to concern for sepsis was started on IV fluids and Tamiflu .  Repeated chest x-ray showed no acute findings   Discharge Diagnoses:  Principal Problem:   Influenza A Active Problems:   Hypothyroidism   History of TIA (transient ischemic attack)   Chronic diastolic CHF (congestive heart failure) (HCC)   Thrombocytopenia   Leukopenia  Influenza A/generalized weakness: Started on IV fluids and Tamiflu . Blood pressure was low on admission he was resuscitated. He remained afebrile and leukopenia improved. He remained afebrile continues to have persistent cough. He was given Robitussin. And he completed course of Tamiflu  in house.  Acute kidney injury: Likely prerenal azotemia resolved with IV fluids.  Chronic HFpEF: No changes made to his medication.  Paroxysmal atrial fibrillation: Not on anticoagulation continue Plavix  rate control.  Thrombocytopenia: Appears to be chronic follow-up with PCP as an outpatient.  Leukopenia: This resolved likely due to viral infection.  Discharge Instructions  Discharge Instructions     Increase activity slowly   Complete by: As directed    Increase activity slowly   Complete by: As directed       Allergies as of 01/27/2024   No Known  Allergies      Medication List     STOP taking these medications    amoxicillin  875 MG tablet Commonly known as: AMOXIL        TAKE these medications    aspirin  EC 81 MG tablet Take 81 mg by mouth daily. Swallow whole.   benzonatate  100 MG capsule Commonly known as: TESSALON  Take 100-200 mg by mouth 3 (three) times daily as needed for cough.   clopidogrel  75 MG tablet Commonly known as: PLAVIX  Take 1 tablet (75 mg total) by mouth daily.   levothyroxine  175 MCG tablet Commonly known as: SYNTHROID  Take 175 mcg by mouth daily before breakfast.   oseltamivir  6 MG/ML Susr suspension Commonly known as: TAMIFLU  Take 75 mg by mouth daily.        Allergies[1]  Consultations: None   Procedures/Studies: DG CHEST PORT 1 VIEW Result Date: 01/25/2024 CLINICAL DATA:  Cough. EXAM: PORTABLE CHEST 1 VIEW COMPARISON:  01/22/2024 FINDINGS: Unchanged elevation of right hemidiaphragm.The cardiomediastinal contours are stable. TAVR. Pulmonary vasculature is normal. No consolidation, pleural effusion, or pneumothorax. No acute osseous abnormalities are seen. IMPRESSION: No active disease. Electronically Signed   By: Andrea Gasman M.D.   On: 01/25/2024 16:26   DG Chest 2 View Result Date: 01/22/2024 EXAM: 2 VIEW(S) XRAY OF THE CHEST 01/22/2024 08:50:00 PM COMPARISON: 12/09/2022 CLINICAL HISTORY: shortness of breath FINDINGS: LUNGS AND PLEURA: Elevated right hemidiaphragm, unchanged. No focal pulmonary opacity. No pleural effusion. No pneumothorax. HEART AND MEDIASTINUM: Prosthetic aortic valve noted. No acute abnormality of the cardiac and mediastinal silhouettes. BONES AND SOFT TISSUES: No acute osseous abnormality. IMPRESSION: 1. No acute cardiopulmonary findings. 2. Chronic elevation of the right hemidiaphragm, unchanged.  3. Prosthetic aortic valve. Electronically signed by: Dorethia Molt MD 01/22/2024 09:35 PM EST RP Workstation: [id number]     Subjective: No  complaints  Discharge Exam: Vitals:   01/26/24 2017 01/27/24 0551  BP: 122/69 136/78  Pulse: 71 78  Resp: 19 18  Temp: 97.6 F (36.4 C) 97.8 F (36.6 C)  SpO2: 97% 96%   Vitals:   01/26/24 1233 01/26/24 1400 01/26/24 2017 01/27/24 0551  BP: 106/66  122/69 136/78  Pulse: 70  71 78  Resp: 16  19 18   Temp: 97.6 F (36.4 C)  97.6 F (36.4 C) 97.8 F (36.6 C)  TempSrc: Oral  Oral   SpO2: 95% 98% 97% 96%  Weight:      Height:        General: Pt is alert, awake, not in acute distress Cardiovascular: RRR, S1/S2 +, no rubs, no gallops Respiratory: CTA bilaterally, no wheezing, no rhonchi Abdominal: Soft, NT, ND, bowel sounds + Extremities: no edema, no cyanosis    The results of significant diagnostics from this hospitalization (including imaging, microbiology, ancillary and laboratory) are listed below for reference.     Microbiology: Recent Results (from the past 240 hours)  Resp panel by RT-PCR (RSV, Flu A&B, Covid) Anterior Nasal Swab     Status: Abnormal   Collection Time: 01/22/24  8:31 PM   Specimen: Anterior Nasal Swab  Result Value Ref Range Status   SARS Coronavirus 2 by RT PCR NEGATIVE NEGATIVE Final    Comment: (NOTE) SARS-CoV-2 target nucleic acids are NOT DETECTED.  The SARS-CoV-2 RNA is generally detectable in upper respiratory specimens during the acute phase of infection. The lowest concentration of SARS-CoV-2 viral copies this assay can detect is 138 copies/mL. A negative result does not preclude SARS-Cov-2 infection and should not be used as the sole basis for treatment or other patient management decisions. A negative result may occur with  improper specimen collection/handling, submission of specimen other than nasopharyngeal swab, presence of viral mutation(s) within the areas targeted by this assay, and inadequate number of viral copies(<138 copies/mL). A negative result must be combined with clinical observations, patient history, and  epidemiological information. The expected result is Negative.  Fact Sheet for Patients:  bloggercourse.com  Fact Sheet for Healthcare Providers:  seriousbroker.it  This test is no t yet approved or cleared by the [place]  FDA and  has been authorized for detection and/or diagnosis of SARS-CoV-2 by FDA under an Emergency Use Authorization (EUA). This EUA will remain  in effect (meaning this test can be used) for the duration of the COVID-19 declaration under Section 564(b)(1) of the Act, 21 U.S.C.section 360bbb-3(b)(1), unless the authorization is terminated  or revoked sooner.       Influenza A by PCR POSITIVE (A) NEGATIVE Final   Influenza B by PCR NEGATIVE NEGATIVE Final    Comment: (NOTE) The Xpert Xpress SARS-CoV-2/FLU/RSV plus assay is intended as an aid in the diagnosis of influenza from Nasopharyngeal swab specimens and should not be used as a sole basis for treatment. Nasal washings and aspirates are unacceptable for Xpert Xpress SARS-CoV-2/FLU/RSV testing.  Fact Sheet for Patients: bloggercourse.com  Fact Sheet for Healthcare Providers: seriousbroker.it  This test is not yet approved or cleared by the [place]  FDA and has been authorized for detection and/or diagnosis of SARS-CoV-2 by FDA under an Emergency Use Authorization (EUA). This EUA will remain in effect (meaning this test can be used) for the duration of the COVID-19 declaration under Section 564(b)(1) of  the Act, 21 U.S.C. section 360bbb-3(b)(1), unless the authorization is terminated or revoked.     Resp Syncytial Virus by PCR NEGATIVE NEGATIVE Final    Comment: (NOTE) Fact Sheet for Patients: bloggercourse.com  Fact Sheet for Healthcare Providers: seriousbroker.it  This test is not yet approved or cleared by the [place]  FDA and has  been authorized for detection and/or diagnosis of SARS-CoV-2 by FDA under an Emergency Use Authorization (EUA). This EUA will remain in effect (meaning this test can be used) for the duration of the COVID-19 declaration under Section 564(b)(1) of the Act, 21 U.S.C. section 360bbb-3(b)(1), unless the authorization is terminated or revoked.  Performed at [facility], [address] [place], [place] [address]      Labs: BNP (last 3 results) No results for input(s): BNP in the last 8760 hours. Basic Metabolic Panel: Recent Labs  Lab 01/22/24 2037 01/23/24 0510 01/24/24 0428 01/25/24 0443 01/26/24 0511  NA 134* 133* 138 140 137  K 3.9 4.0 4.1 4.5 4.5  CL 98 103 106 106 104  CO2 27 20* 19* 24 25  GLUCOSE 87 93 91 97 108*  BUN 22 24* 17 16 19   CREATININE 1.24 1.01 0.87 0.89 0.92  CALCIUM  8.8* 8.7* 8.9 9.3 9.2   Liver Function Tests: No results for input(s): AST, ALT, ALKPHOS, BILITOT, PROT, ALBUMIN in the last 168 hours. No results for input(s): LIPASE, AMYLASE in the last 168 hours. No results for input(s): AMMONIA in the last 168 hours. CBC: Recent Labs  Lab 01/22/24 2037 01/23/24 0510 01/24/24 0428 01/25/24 0443 01/26/24 0511  WBC 3.3* 2.8* 2.7* 2.5* 7.2  NEUTROABS  --  1.6* 1.3* 1.0* 5.5  HGB 14.2 13.4 13.6 12.9* 12.7*  HCT 43.1 40.2 41.0 39.4 38.1*  MCV 91.9 91.6 91.1 91.8 90.7  PLT 105* 80* 102* 102* 97*   Cardiac Enzymes: No results for input(s): CKTOTAL, CKMB, CKMBINDEX, TROPONINI in the last 168 hours. BNP: Invalid input(s): POCBNP CBG: No results for input(s): GLUCAP in the last 168 hours. D-Dimer No results for input(s): DDIMER in the last 72 hours. Hgb A1c No results for input(s): HGBA1C in the last 72 hours. Lipid Profile No results for input(s): CHOL, HDL, LDLCALC, TRIG, CHOLHDL, LDLDIRECT in the last 72 hours. Thyroid  function studies No results for input(s): TSH, T4TOTAL,  T3FREE, THYROIDAB in the last 72 hours.  Invalid input(s): FREET3 Anemia work up No results for input(s): VITAMINB12, FOLATE, FERRITIN, TIBC, IRON, RETICCTPCT in the last 72 hours. Urinalysis    Component Value Date/Time   COLORURINE YELLOW 05/02/2019 0629   APPEARANCEUR Clear 10/20/2023 0000   LABSPEC 1.020 05/02/2019 0629   PHURINE 5.0 05/02/2019 0629   GLUCOSEU Negative 10/20/2023 0000   HGBUR SMALL (A) 05/02/2019 0629   BILIRUBINUR Negative 10/20/2023 0000   KETONESUR NEGATIVE 05/02/2019 0629   PROTEINUR Negative 10/20/2023 0000   PROTEINUR NEGATIVE 05/02/2019 0629   UROBILINOGEN negative (A) 07/14/2019 1449   NITRITE Negative 10/20/2023 0000   NITRITE NEGATIVE 05/02/2019 0629   LEUKOCYTESUR Negative 10/20/2023 0000   LEUKOCYTESUR NEGATIVE 05/02/2019 0629   Sepsis Labs Recent Labs  Lab 01/23/24 0510 01/24/24 0428 01/25/24 0443 01/26/24 0511  WBC 2.8* 2.7* 2.5* 7.2   Microbiology Recent Results (from the past 240 hours)  Resp panel by RT-PCR (RSV, Flu A&B, Covid) Anterior Nasal Swab     Status: Abnormal   Collection Time: 01/22/24  8:31 PM   Specimen: Anterior Nasal Swab  Result Value Ref Range Status  SARS Coronavirus 2 by RT PCR NEGATIVE NEGATIVE Final    Comment: (NOTE) SARS-CoV-2 target nucleic acids are NOT DETECTED.  The SARS-CoV-2 RNA is generally detectable in upper respiratory specimens during the acute phase of infection. The lowest concentration of SARS-CoV-2 viral copies this assay can detect is 138 copies/mL. A negative result does not preclude SARS-Cov-2 infection and should not be used as the sole basis for treatment or other patient management decisions. A negative result may occur with  improper specimen collection/handling, submission of specimen other than nasopharyngeal swab, presence of viral mutation(s) within the areas targeted by this assay, and inadequate number of viral copies(<138 copies/mL). A negative result must  be combined with clinical observations, patient history, and epidemiological information. The expected result is Negative.  Fact Sheet for Patients:  bloggercourse.com  Fact Sheet for Healthcare Providers:  seriousbroker.it  This test is no t yet approved or cleared by the [place]  FDA and  has been authorized for detection and/or diagnosis of SARS-CoV-2 by FDA under an Emergency Use Authorization (EUA). This EUA will remain  in effect (meaning this test can be used) for the duration of the COVID-19 declaration under Section 564(b)(1) of the Act, 21 U.S.C.section 360bbb-3(b)(1), unless the authorization is terminated  or revoked sooner.       Influenza A by PCR POSITIVE (A) NEGATIVE Final   Influenza B by PCR NEGATIVE NEGATIVE Final    Comment: (NOTE) The Xpert Xpress SARS-CoV-2/FLU/RSV plus assay is intended as an aid in the diagnosis of influenza from Nasopharyngeal swab specimens and should not be used as a sole basis for treatment. Nasal washings and aspirates are unacceptable for Xpert Xpress SARS-CoV-2/FLU/RSV testing.  Fact Sheet for Patients: bloggercourse.com  Fact Sheet for Healthcare Providers: seriousbroker.it  This test is not yet approved or cleared by the [place]  FDA and has been authorized for detection and/or diagnosis of SARS-CoV-2 by FDA under an Emergency Use Authorization (EUA). This EUA will remain in effect (meaning this test can be used) for the duration of the COVID-19 declaration under Section 564(b)(1) of the Act, 21 U.S.C. section 360bbb-3(b)(1), unless the authorization is terminated or revoked.     Resp Syncytial Virus by PCR NEGATIVE NEGATIVE Final    Comment: (NOTE) Fact Sheet for Patients: bloggercourse.com  Fact Sheet for Healthcare Providers: seriousbroker.it  This test is  not yet approved or cleared by the [place]  FDA and has been authorized for detection and/or diagnosis of SARS-CoV-2 by FDA under an Emergency Use Authorization (EUA). This EUA will remain in effect (meaning this test can be used) for the duration of the COVID-19 declaration under Section 564(b)(1) of the Act, 21 U.S.C. section 360bbb-3(b)(1), unless the authorization is terminated or revoked.  Performed at [facility], [address] [place], [place] [address]      Time coordinating discharge: Over 35 minutes  SIGNED:   Erle Odell Castor, MD  [facility] 01/27/2024, 10:48 AM Pager   If 7PM-7AM, please contact night-coverage [email or web address] Password [email or web address]     [1] No Known Allergies

## 2024-02-28 ENCOUNTER — Other Ambulatory Visit: Payer: Self-pay | Admitting: Diagnostic Neuroimaging

## 2024-02-29 NOTE — Telephone Encounter (Signed)
 I called the patient and left a message to call the office back. Held and appointment slot for further refills for March 18th at 3pm
